# Patient Record
Sex: Male | Born: 1945 | Race: Black or African American | Hispanic: No | Marital: Married | Smoking: Former smoker
Health system: Southern US, Community
[De-identification: ages and names within clinical notes are randomized; demographics above are authoritative.]

## PROBLEM LIST (undated history)

## (undated) DIAGNOSIS — C61 Malignant neoplasm of prostate: Secondary | ICD-10-CM

## (undated) DIAGNOSIS — I214 Non-ST elevation (NSTEMI) myocardial infarction: Secondary | ICD-10-CM

## (undated) DIAGNOSIS — K5900 Constipation, unspecified: Secondary | ICD-10-CM

## (undated) DIAGNOSIS — I2510 Atherosclerotic heart disease of native coronary artery without angina pectoris: Secondary | ICD-10-CM

## (undated) DIAGNOSIS — E119 Type 2 diabetes mellitus without complications: Secondary | ICD-10-CM

## (undated) DIAGNOSIS — I639 Cerebral infarction, unspecified: Secondary | ICD-10-CM

## (undated) HISTORY — DX: Malignant neoplasm of prostate: C61

## (undated) HISTORY — DX: Atherosclerotic heart disease of native coronary artery without angina pectoris: I25.10

## (undated) HISTORY — PX: CARDIAC CATHETERIZATION: SHX172

## (undated) HISTORY — PX: CORONARY ARTERY BYPASS GRAFT: SHX141

## (undated) HISTORY — PX: CATARACT EXTRACTION: SUR2

---

## 2011-03-26 ENCOUNTER — Ambulatory Visit (INDEPENDENT_AMBULATORY_CARE_PROVIDER_SITE_OTHER): Payer: Medicare Other

## 2011-03-26 DIAGNOSIS — J111 Influenza due to unidentified influenza virus with other respiratory manifestations: Secondary | ICD-10-CM

## 2012-06-07 ENCOUNTER — Telehealth: Payer: Self-pay

## 2012-06-07 NOTE — Telephone Encounter (Signed)
We need to fax a referral for this patient to [facility] on [address] if possible he has app next Thursday.

## 2012-06-08 NOTE — Telephone Encounter (Signed)
Can we put in order for hearing eval? Pt here last on 03/2011 for dizziness/ chart in box PA desk

## 2012-06-08 NOTE — Telephone Encounter (Signed)
Why does pt need hearing eval?  We have not seen him in over a yr.

## 2012-06-12 ENCOUNTER — Telehealth: Payer: Self-pay

## 2012-06-12 NOTE — Telephone Encounter (Signed)
Pt would like referral to [facility] please call [phone] if questions

## 2012-06-12 NOTE — Telephone Encounter (Signed)
Spoke to [facility] pt needs appt

## 2012-06-12 NOTE — Telephone Encounter (Signed)
Called patient.  No answer.

## 2016-01-28 ENCOUNTER — Ambulatory Visit: Payer: Medicare Other

## 2016-02-04 ENCOUNTER — Ambulatory Visit: Payer: Medicare Other

## 2016-03-22 DIAGNOSIS — I2510 Atherosclerotic heart disease of native coronary artery without angina pectoris: Secondary | ICD-10-CM | POA: Insufficient documentation

## 2016-03-22 DIAGNOSIS — Z951 Presence of aortocoronary bypass graft: Secondary | ICD-10-CM | POA: Insufficient documentation

## 2016-03-31 ENCOUNTER — Ambulatory Visit (INDEPENDENT_AMBULATORY_CARE_PROVIDER_SITE_OTHER): Payer: Medicare Other | Admitting: Family Medicine

## 2016-03-31 ENCOUNTER — Telehealth: Payer: Self-pay

## 2016-03-31 VITALS — BP 106/68 | HR 100 | Temp 98.4°F | Resp 16 | Ht 68.0 in | Wt 160.0 lb

## 2016-03-31 DIAGNOSIS — I209 Angina pectoris, unspecified: Secondary | ICD-10-CM

## 2016-03-31 DIAGNOSIS — I25119 Atherosclerotic heart disease of native coronary artery with unspecified angina pectoris: Secondary | ICD-10-CM | POA: Diagnosis not present

## 2016-03-31 DIAGNOSIS — E118 Type 2 diabetes mellitus with unspecified complications: Secondary | ICD-10-CM

## 2016-03-31 MED ORDER — TRAMADOL HCL 50 MG PO TABS: 50.0000 mg | ORAL_TABLET | Freq: Three times a day (TID) | ORAL | 0 refills | Status: DC | PRN

## 2016-03-31 NOTE — Progress Notes (Signed)
Patient ID: [name] [name], male    DOB: 03-15-1946, 70 y.o.   MRN: [id number]  PCP: No primary care provider on file.  Chief Complaint  Patient presents with  . Referral    to cardiologist - states he had cardiology procedure on 11/4 in [place]  . lab work    Cholesterol/Thyroid    Subjective:  HPI 70 year old male presents for a follow-up post CABG in [facility]  On 11/4. Reports he was travelling and suffered an MI and had heart surgery with  3 stent placement. Upon discharge from [facility], he was referred to Dr. Clista Bernhardt, cardiologist. He reports following up Dr. Maudie Mercury although he and spouse do not understand, the reason patient hasn't been able to begin cardiac rehabilitation.  Patient presents today wanting to begin the process of being referred to a new cardiologist and receiving a new referral to cardiac rehabilitation. The visit at this point was delayed in order to contact Dr. Julianne Rice office to determine what orders have been initiated to expedite patient's cardiac rehabilitation.  -Called Dr. Julianne Rice office and spoke with her nurse. Patient being released to cardiac rehabilitation is pending sign off by the surgeon that performed CABG and patient obtaining a chest x-ray.  All other necessary orders have been performed and recommendation submitted for patient to start cardiac rehabilitation.  There may possibly be some minor delay in receiving the sign off from the cardiac surgeon due to the upcoming Christmas holiday.  -Resumed office visit to communicate information obtained from Dr. Julianne Rice office to patient and his wife. They verbalized understanding. Visit here at [facility] is not to establish care and obtain medication to treat medial sternum pain from surgical incision.  Post-CABG Mediastinal Surgical Incision  He has been taking tramadol for post surgical soreness. Denies any crushing or stabbing chest pain. No shortness of breath. Denies drainage of surgical wound. He  takes Plavix and aspirin daily for anticoagulation-denies bleeding gums or nose.  Diabetes, Controlled Taking Metformin every other day  Last glucose reading was in the in low 90's.    Chief Complaint  Patient presents with  . Referral    to cardiologist - states he had cardiology procedure on 11/4 in [place]  . lab work    Cholesterol/Thyroid     Social History   Social History  . Marital status: Married    Spouse name: N/A  . Number of children: N/A  . Years of education: N/A   Occupational History  . Not on file.   Social History Main Topics  . Smoking status: Never Smoker  . Smokeless tobacco: Never Used  . Alcohol use No  . Drug use: No  . Sexual activity: Not on file   Other Topics Concern  . Not on file   Social History Narrative  . No narrative on file    No family history on file.   Review of Systems See HPI   No Known Allergies  Prior to Admission medications   Medication Sig Start Date End Date Taking? Authorizing Provider  clopidogrel (PLAVIX) 75 MG tablet  02/19/16  Yes Historical Provider, MD  traMADol Veatrice Bourbon) 50 MG tablet  02/19/16  Yes Historical Provider, MD    Past Medical, Surgical Family and Social History reviewed and updated.    Objective:   Today's Vitals   03/31/16 1133  BP: 106/68  Pulse: 100  Resp: 16  Temp: 98.4 F (36.9 C)  TempSrc: Oral  SpO2: 98%  Weight: 160 lb (72.6 kg)  Height: 5\' 8"  (1.727 m)    Wt Readings from Last 3 Encounters:  03/31/16 160 lb (72.6 kg)    Physical Exam  Constitutional: He is oriented to person, place, and time. He appears well-developed.  HENT:  Head: Normocephalic and atraumatic.  Right Ear: External ear normal.  Left Ear: External ear normal.  Nose: Nose normal.  Mouth/Throat: Oropharynx is clear and moist.  Neck: Normal range of motion. Neck supple.  Cardiovascular: Regular rhythm, normal heart sounds and intact distal pulses.   No murmur heard. Pulmonary/Chest: Effort  normal and breath sounds normal. He exhibits no tenderness.  Mediastinal incision intact negative of drainage   Musculoskeletal: Normal range of motion.  Lymphadenopathy:    He has no cervical adenopathy.  Neurological: He is alert and oriented to person, place, and time.  Skin: Skin is warm and dry.  Psychiatric: He has a normal mood and affect. His behavior is normal. Judgment and thought content normal.       Assessment & Plan:  1. Coronary artery disease involving native coronary artery of native heart with angina pectoris ([place]) 2. Type 2 diabetes mellitus with complication, without long-term current use of insulin ([place]), stable   -Patient and spouse verbalize understanding regarding the referral process to cardiac rehabilitation.  -Tramadol 50 mg every 8 hours as needed for mediastinal pain.  -Return for follow-up as needed.  Carroll Sage. Kenton Kingfisher, MSN, FNP-C [facility]  Total of  1 hour spent with pt., greater than 75% which was spent in discussion of tx options, consulting with cardiologist, managing acute pain, and ensuring stability of chronic CAD condition.

## 2016-03-31 NOTE — Patient Instructions (Addendum)
Spoke with [facility] and Dr. Julianne Rice office and your chest x-ray is pending. Cardiac rehabilitation is pending sign off from your surgeon that performed surgery in [place].  You have a follow-up schedule with Dr. Maudie Mercury on 04/26/2016  Return for follow-up with me in 3 months.  I have refilled your Tramadol for pain.    IF you received an x-ray today, you will receive an invoice from [facility]. Please contact [facility] at [phone] with questions or concerns regarding your invoice.   IF you received labwork today, you will receive an invoice from [place]. Please contact [facility] at [phone] with questions or concerns regarding your invoice.   Our billing staff will not be able to assist you with questions regarding bills from these companies.  You will be contacted with the lab results as soon as they are available. The fastest way to get your results is to activate your My Chart account. Instructions are located on the last page of this paperwork. If you have not heard from [place] regarding the results in 2 weeks, please contact this office.      Cardiac Rehabilitation WHAT IS CARDIAC REHABILITATION? Cardiac rehabilitation is a treatment program that helps improve the health and well-being of people who have heart problems. Cardiac rehabilitation includes exercise training, education, and counseling to help you get stronger and return to an active lifestyle. This program can help you get better faster and reduce any future hospital stays. WHY MIGHT I NEED CARDIAC REHABILITATION? Cardiac rehabilitation programs can help when you have or have had:  A heart attack.  Heart failure.  Peripheral artery disease.  Coronary artery disease.  Angina.  Lung or breathing problems. Cardiac rehabilitation programs are also used when you have had:  Coronary artery bypass graft surgery.  Heart valve replacement.  Heart stent placement.  Heart  transplant.  Aneurysm repair. WHAT ARE THE BENEFITS OF CARDIAC REHABILITATION? Cardiac rehabilitation can help:  Reduce problems like chest pain and trouble breathing.  Change risk factors that contribute to heart disease, such as:  Smoking.  High blood pressure.  High cholesterol.  Diabetes.  Being out of shape or not active.  Weighing more than 30% higher than your ideal weight.  Diet.  Improve your mental outlook so you feel:  More hopeful.  Better about yourself.  More confident about taking care of yourself.  Get support from health experts as well as other people with similar problems.  Learn how to manage and understand your medicines.  Teach your family about your condition and how to participate in your recovery. WHAT HAPPENS IN CARDIAC REHABILITATION? You will be assessed by a cardiac rehabilitation team. They will check your health history and do a physical exam. You may need blood tests, stress tests, and other evaluations to make sure that you are ready to start cardiac rehabilitation. The cardiac rehabilitation team works with you to make a plan based on your health and goals. Your program will be tailored to fit you and your needs and may change as you progress. You may work with a health care team that includes:  Doctors.  Nurses.  Dietitians.  Psychologists.  Exercise specialists.  Physical and occupational therapists. WHAT ARE THE PHASES OF CARDIAC REHABILITATION? A cardiac rehabilitation program is often divided into phases. You advance from one phase to the next. Phase One This phase starts while you are still in the hospital. You may start by walking in your room and then in the hall. You may start  some simple exercises with a therapist. Phase Two This phase begins when you go home or to another facility. This phase may last 8-12 weeks. You will travel to a cardiac rehabilitation center or another place where rehabilitation is offered. You  will slowly increase your activity level while being closely watched by a nurse or therapist. Exercises may include a combination of strength or resistance training and "cardio" or aerobic movement on a treadmill or other machines. Your condition will determine how often and how long these sessions last. In phase two, you may learn how to cook healthy meals, control your blood sugar, and manage your medicines. You may need help with scheduling or planning how and when to take your medicines. If you have questions about your medicines, it is very important that you talk to your health care provider. Phase Three This phase continues for the rest of your life. There will be less supervision. You may still participate in cardiac rehabilitation activities or become part of a group in your community. You may benefit from talking about your experience with other people who are facing similar challenges. WHEN SHOULD I SEEK IMMEDIATE MEDICAL CARE? Seek immediate medical care if:  You have severe chest discomfort, especially if the pain is crushing or pressure-like and spreads to your arms, back, neck, or jaw. Do not wait to see if the pain will go away.  You have weakness or numbness in your face, arms, or legs, especially on one side of the body.  Your speech is slurred.  You are confused.  You have a sudden severe headache or loss of vision.  You have shortness of breath.  You are sweating and have nausea.  You feel dizzy or faint.  You are fatigued. These symptoms may represent a serious problem that is an emergency. Do not wait to see if the symptoms will go away. Get medical help right away. Call your local emergency services (911 in the [place] Do not drive yourself to the hospital. This information is not intended to replace advice given to you by your health care provider. Make sure you discuss any questions you have with your health care provider. Document Released: 01/05/2008 Document  Revised: 08/06/2015 Document Reviewed: 02/09/2015 Elsevier Interactive Patient Education  2017 [employer].

## 2016-03-31 NOTE — Telephone Encounter (Signed)
Pt wife is needing to talk with Kenton Kingfisher regarding a form that they talked about when patient was seen today   Best number [phone]

## 2016-04-05 ENCOUNTER — Telehealth: Payer: Self-pay

## 2016-04-05 NOTE — Telephone Encounter (Signed)
Spoke to pt and then wife about what was needed regarding form. They reported that they have gotten it all straightened out with the cardiologist.

## 2016-04-05 NOTE — Telephone Encounter (Signed)
Pt had heart surgery in November and is having some chest congestion and would like to talk with Molli Barrows as to what can be taken along with all his other meds   Best number [phone]

## 2016-04-06 ENCOUNTER — Ambulatory Visit: Payer: Medicare Other | Admitting: Family Medicine

## 2016-04-06 NOTE — Telephone Encounter (Signed)
Called and advised wife to bring pt in to be evaluated if pt's mild Sxs worsen or if he gets a fever, SOB, increased fatigue. Wife agreed. Suggested that she ask their pharm about OTC meds that might interfere with Rxs pt is taking. She stated she did last night and pharm suggested Mucinex. I advised we most often recommend this also w/drinking plenty of water. They have made a tentative appt this afternoon in case pt doesn't improve or worsens.

## 2016-04-07 ENCOUNTER — Encounter (HOSPITAL_COMMUNITY)
Admission: RE | Admit: 2016-04-07 | Discharge: 2016-04-07 | Disposition: A | Discharge status: Home / Self Care | Payer: Medicare Other | Source: Ambulatory Visit | Attending: Cardiology | Admitting: Cardiology

## 2016-04-07 ENCOUNTER — Encounter (HOSPITAL_COMMUNITY): Payer: Self-pay

## 2016-04-07 VITALS — BP 100/80 | HR 97 | Ht 65.25 in | Wt 161.4 lb

## 2016-04-07 DIAGNOSIS — Z951 Presence of aortocoronary bypass graft: Secondary | ICD-10-CM | POA: Insufficient documentation

## 2016-04-07 DIAGNOSIS — Z87891 Personal history of nicotine dependence: Secondary | ICD-10-CM | POA: Diagnosis not present

## 2016-04-07 DIAGNOSIS — Z9889 Other specified postprocedural states: Secondary | ICD-10-CM | POA: Insufficient documentation

## 2016-04-07 DIAGNOSIS — E119 Type 2 diabetes mellitus without complications: Secondary | ICD-10-CM | POA: Diagnosis not present

## 2016-04-07 DIAGNOSIS — Z48812 Encounter for surgical aftercare following surgery on the circulatory system: Secondary | ICD-10-CM | POA: Insufficient documentation

## 2016-04-07 HISTORY — DX: Type 2 diabetes mellitus without complications: E11.9

## 2016-04-07 HISTORY — DX: Cerebral infarction, unspecified: I63.9

## 2016-04-07 HISTORY — DX: Constipation, unspecified: K59.00

## 2016-04-07 HISTORY — DX: Non-ST elevation (NSTEMI) myocardial infarction: I21.4

## 2016-04-07 NOTE — Progress Notes (Signed)
Cardiac Rehab Medication Review by a Pharmacist  Does the patient  feel that his/her medications are working for him/her?  yes  Has the patient been experiencing any side effects to the medications prescribed?  No longer. Patient was experiencing fatigue and dizziness, therefore his prescriber discontinued metoprolol and atorvastatin.   Does the patient measure his/her own blood pressure or blood glucose at home?  no   Does the patient have any problems obtaining medications due to transportation or finances?   no  Understanding of regimen: fair Understanding of indications: fair Potential of compliance: good   Pharmacist comments: TE is a 49 yoM who presents to cardiac rehab s/p CABG in good spirits without assistance. He is accompanied by his wife. Today, we addressed his concern of being "back to normal" to which I explained that cardiac rehab will help strengthen his heart and helps him improve his tolerance. Overall, he understands and agrees with his medication regimen.    [name] [name], PharmD PGY1 Pharmacy Resident [phone] (Pager) 04/07/2016 8:00 AM

## 2016-04-07 NOTE — Progress Notes (Signed)
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  clopidogrel (PLAVIX) 75 MG tablet, Take 75 mg by mouth once. , Disp: , Rfl:  .  senna (SENOKOT) 8.6 MG tablet, Take 1 tablet by mouth 2 (two) times daily as needed for constipation., Disp: , Rfl:  .  traMADol (ULTRAM) 50 MG tablet, Take 1 tablet (50 mg total) by mouth every 8 (eight) hours as needed for moderate pain. (Patient taking differently: Take 25 mg by mouth every 4 (four) hours as needed for moderate pain. ), Disp: 30 tablet, Rfl: 0 .  acetaminophen (TYLENOL) 650 MG CR tablet, Take 650 mg by mouth QID., Disp: , Rfl:  .  aspirin EC 81 MG tablet, Take 81 mg by mouth daily., Disp: , Rfl:   Past Medical History: Past Medical History:  Diagnosis Date  . Constipation   . Diabetes mellitus without complication ([place])    borderline/ pre diabetic  . Stroke ([employer])    L side - 40 years ago    Tobacco Use: History  Smoking Status  . Former Smoker  . Quit date: 1970  Smokeless Tobacco  . Never Used    Labs: Recent Review Flowsheet Data    There is no flowsheet data to display.      Capillary Blood Glucose: No results found for: GLUCAP   Exercise Target Goals: Date: 04/07/16  Exercise Program Goal: Individual exercise prescription set with THRR, safety & activity barriers. Participant demonstrates ability to understand and report RPE using BORG  scale, to self-measure pulse accurately, and to acknowledge the importance of the exercise prescription.  Exercise Prescription Goal: Starting with aerobic activity 30 plus minutes a day, 3 days per week for initial exercise prescription. Provide home exercise prescription and guidelines that participant acknowledges understanding prior to discharge.  Activity Barriers & Risk Stratification:     Activity Barriers & Cardiac Risk Stratification - 04/07/16 0918      Activity Barriers & Cardiac Risk Stratification   Activity Barriers Deconditioning;Muscular Weakness;Joint Problems;Other (comment)   Comments limited shoulder mobility (B), L sided stroke 40 years ago and L knee/leg discomfort   Cardiac Risk Stratification High      6 Minute Walk:     6 Minute Walk    Row Name 04/07/16 1125 04/07/16 1126 04/07/16 1248     6 Minute Walk   Phase Initial (P)  Initial Initial   Distance  - (P)  878 feet 878 feet   Walk Time  - (P)  6 minutes 6 minutes   # of Rest Breaks  - (P)  0 0   RPE  -  - 11   Symptoms  -  - No   Resting HR  -  - 97 bpm   Resting BP  -  - 100/80   Max Ex. HR  -  - 110 bpm   Max Ex. BP  -  -  114/86   2 Minute Post BP  -  - 108/76   Row Name 04/07/16 1251         6 Minute Walk   MPH 1.68     METS 2.1     VO2 Peak 7.48        Initial Exercise Prescription:     Initial Exercise Prescription - 04/07/16 1200      Date of Initial Exercise RX and Referring Provider   Date 04/07/16   Referring Provider Clista Bernhardt MD, Loreta Ave MD     Recumbant Bike   Level 1   Minutes 10   METs 1     NuStep   Level 1   Minutes 10   METs 1     Track   Laps 6   Minutes 10   METs 2.03     Prescription Details   Frequency (times per week) 3   Duration Progress to 30 minutes of continuous aerobic without signs/symptoms of physical distress     Intensity   THRR 40-80% of Max Heartrate 60-120   Ratings of Perceived Exertion 11-13   Perceived Dyspnea 0-4      Progression   Progression Continue progressive overload as per policy without signs/symptoms or physical distress.     Resistance Training   Training Prescription Yes   Weight 1lb   Reps 10-12      Perform Capillary Blood Glucose checks as needed.  Exercise Prescription Changes:   Exercise Comments:   Discharge Exercise Prescription (Final Exercise Prescription Changes):   Nutrition:  Target Goals: Understanding of nutrition guidelines, daily intake of sodium 1500mg , cholesterol 200mg , calories 30% from fat and 7% or less from saturated fats, daily to have 5 or more servings of fruits and vegetables.  Biometrics:     Pre Biometrics - 04/07/16 1249      Pre Biometrics   Height 5' 5.25" (1.657 m)   Weight 161 lb 6 oz (73.2 kg)   Waist Circumference 37 inches   Hip Circumference 35.5 inches   Waist to Hip Ratio 1.04 %   BMI (Calculated) 26.7   Triceps Skinfold 12 mm   % Body Fat 25.2 %   Grip Strength 34 kg   Flexibility 7.5 in   Single Leg Stand 0 seconds       Nutrition Therapy Plan and Nutrition Goals:   Nutrition Discharge: Nutrition Scores:   Nutrition Goals Re-Evaluation:   Psychosocial: Target Goals: Acknowledge presence or absence of depression, maximize coping skills, provide positive support system. Participant is able to verbalize types and ability to use techniques and skills needed for reducing stress and depression.  Initial Review & Psychosocial Screening:     Initial Psych Review & Screening - 04/07/16 1558      Initial Review   Current issues with Current Stress Concerns   Source of Stress Concerns Unable to participate in former interests or hobbies;Chronic Illness   Comments pt rates his low level of stress related to work and health     [place]? Yes   Comments Pt wife accompanied him for his orientation appt.     Barriers   Psychosocial barriers to participate in program The patient should benefit  from training in stress management and relaxation.      Quality of Life Scores:     Quality of Life - 04/07/16 1346      Quality of Life Scores   Health/Function Pre 23.8 %  Socioeconomic Pre 25.33 %   Psych/Spiritual Pre 25.71 %   Family Pre 25.2 %   GLOBAL Pre 24.7 %      PHQ-9: Recent Review Flowsheet Data    There is no flowsheet data to display.      Psychosocial Evaluation and Intervention:     Psychosocial Evaluation - 04/07/16 1601      Psychosocial Evaluation & Interventions   Interventions Stress management education;Relaxation education      Psychosocial Re-Evaluation:   Vocational Rehabilitation: Provide vocational rehab assistance to qualifying candidates.   Vocational Rehab Evaluation & Intervention:     Vocational Rehab - 04/07/16 1603      Initial Vocational Rehab Evaluation & Intervention   Assessment shows need for Vocational Rehabilitation Yes  Pt indicated he would like to meet with the vocational rehab  counselor.  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      Education: Education Goals: Education classes will be provided on a weekly basis, covering required topics. Participant will state understanding/return demonstration of topics presented.  Learning Barriers/Preferences:     Learning Barriers/Preferences - 04/07/16 [id number]      Learning Barriers/Preferences   Learning Barriers Sight   Learning Preferences Written Material;Pictoral;Individual Instruction      Education Topics: Count Your Pulse:  -Group instruction provided by verbal instruction, demonstration, patient participation and written materials to support subject.  Instructors address importance of being able to find your pulse and how to count your pulse when at home without a heart monitor.  Patients get hands on experience counting their pulse with staff help and individually.   Heart Attack, Angina, and Risk Factor Modification:  -Group instruction  provided by verbal instruction, video, and written materials to support subject.  Instructors address signs and symptoms of angina and heart attacks.    Also discuss risk factors for heart disease and how to make changes to improve heart health risk factors.   Functional Fitness:  -Group instruction provided by verbal instruction, demonstration, patient participation, and written materials to support subject.  Instructors address safety measures for doing things around the house.  Discuss how to get up and down off the floor, how to pick things up properly, how to safely get out of a chair without assistance, and balance training.   Meditation and Mindfulness:  -Group instruction provided by verbal instruction, patient participation, and written materials to support subject.  Instructor addresses importance of mindfulness and meditation practice to help reduce stress and improve awareness.  Instructor also leads participants through a meditation exercise.    Stretching for Flexibility and Mobility:  -Group instruction provided by verbal instruction, patient participation, and written materials to support subject.  Instructors lead participants through series of stretches that are designed to increase flexibility thus improving mobility.  These stretches are additional exercise for major muscle groups that are typically performed during regular warm up and cool down.   Hands Only CPR Anytime:  -Group instruction provided by verbal instruction, video, patient participation and written materials to support subject.  Instructors co-teach with [facility]HA video for hands only CPR.  Participants get hands on experience with mannequins.   Nutrition I class: Heart Healthy Eating:  -Group instruction provided by PowerPoint slides, verbal discussion, and written materials to support subject matter. The instructor gives an explanation and review of the Therapeutic Lifestyle Changes diet recommendations, which  includes a discussion on lipid goals, dietary fat, sodium, fiber, plant stanol/sterol esters, sugar, and the components of a  well-balanced, healthy diet.   Nutrition II class: Lifestyle Skills:  -Group instruction provided by PowerPoint slides, verbal discussion, and written materials to support subject matter. The instructor gives an explanation and review of label reading, grocery shopping for heart health, heart healthy recipe modifications, and ways to make healthier choices when eating out.   Diabetes Question & Answer:  -Group instruction provided by PowerPoint slides, verbal discussion, and written materials to support subject matter. The instructor gives an explanation and review of diabetes co-morbidities, pre- and post-prandial blood glucose goals, pre-exercise blood glucose goals, signs, symptoms, and treatment of hypoglycemia and hyperglycemia, and foot care basics.   Diabetes Blitz:  -Group instruction provided by PowerPoint slides, verbal discussion, and written materials to support subject matter. The instructor gives an explanation and review of the physiology behind type 1 and type 2 diabetes, diabetes medications and rational behind using different medications, pre- and post-prandial blood glucose recommendations and Hemoglobin A1c goals, diabetes diet, and exercise including blood glucose guidelines for exercising safely.    Portion Distortion:  -Group instruction provided by PowerPoint slides, verbal discussion, written materials, and food models to support subject matter. The instructor gives an explanation of serving size versus portion size, changes in portions sizes over the last 20 years, and what consists of a serving from each food group.   Stress Management:  -Group instruction provided by verbal instruction, video, and written materials to support subject matter.  Instructors review role of stress in heart disease and how to cope with stress positively.      Exercising on Your Own:  -Group instruction provided by verbal instruction, power point, and written materials to support subject.  Instructors discuss benefits of exercise, components of exercise, frequency and intensity of exercise, and end points for exercise.  Also discuss use of nitroglycerin and activating EMS.  Review options of places to exercise outside of rehab.  Review guidelines for sex with heart disease.   Cardiac Drugs I:  -Group instruction provided by verbal instruction and written materials to support subject.  Instructor reviews cardiac drug classes: antiplatelets, anticoagulants, beta blockers, and statins.  Instructor discusses reasons, side effects, and lifestyle considerations for each drug class.   Cardiac Drugs II:  -Group instruction provided by verbal instruction and written materials to support subject.  Instructor reviews cardiac drug classes: angiotensin converting enzyme inhibitors (ACE-I), angiotensin II receptor blockers (ARBs), nitrates, and calcium channel blockers.  Instructor discusses reasons, side effects, and lifestyle considerations for each drug class.   Anatomy and Physiology of the Circulatory System:  -Group instruction provided by verbal instruction, video, and written materials to support subject.  Reviews functional anatomy of heart, how it relates to various diagnoses, and what role the heart plays in the overall system.   Knowledge Questionnaire Score:     Knowledge Questionnaire Score - 04/07/16 1125      Knowledge Questionnaire Score   Pre Score 23/24      Core Components/Risk Factors/Patient Goals at Admission:     Personal Goals and Risk Factors at Admission - 04/07/16 1606      Core Components/Risk Factors/Patient Goals on Admission   Diabetes --  pre diabetic      Core Components/Risk Factors/Patient Goals Review:    Core Components/Risk Factors/Patient Goals at Discharge (Final Review):    ITP Comments:      ITP Comments    Row Name 04/07/16 0909           ITP Comments Dr. Loreta Ave, Medical Director  Comments:  Pt is s/p NSTEMI and Cabg x 3 in [place] pt of Dr. Maudie Mercury and Dr. Myles Gip at [facility] is  in today for cardiac rehab arrived by wheel chair to orientation today from 0800 to 1015. As a part of the orientation appointment pt completed 5 minutes of warm up stretches and 6 minute walk test.  Pt tolerated walk test well and used rolling wheelchair cart for support.  Monitor showed SR with sinus arrhythmias noted. Pt is accompanied by his wife.  Pt indicated on his risk factor profile a low level of stress associated with stress and health.  Pt reports having good health leading up to his cardiac event that occurred while he was traveling out of town. Brief Psychosocial Assessment revealed concerns regarding stress.  Pt is excited to participate in cardiac rehab on next Friday. Cherre Huger, BSN

## 2016-04-13 ENCOUNTER — Encounter (HOSPITAL_COMMUNITY): Payer: Self-pay

## 2016-04-13 ENCOUNTER — Encounter (HOSPITAL_COMMUNITY): Admission: RE | Admit: 2016-04-13 | Payer: Medicare Other | Source: Ambulatory Visit

## 2016-04-13 NOTE — Progress Notes (Signed)
Cardiac Individual Treatment Plan  Patient Details  Name: [name] [name] MRN: [id number] Date of Birth: Mar 09, 1946 Referring Provider:   Flowsheet Row CARDIAC REHAB PHASE II ORIENTATION from 04/07/2016 in [place]  Referring Provider  Maudie Mercury, Andee Poles MD, Loreta Ave MD      Initial Encounter Date:  [place] PHASE II ORIENTATION from 04/07/2016 in [place]  Date  04/07/16  Referring Provider  Clista Bernhardt MD, Loreta Ave MD      Visit Diagnosis: No diagnosis found.  Patient's Home Medications on Admission:  Current Outpatient Prescriptions:  .  acetaminophen (TYLENOL) 650 MG CR tablet, Take 650 mg by mouth QID., Disp: , Rfl:  .  aspirin EC 81 MG tablet, Take 81 mg by mouth daily., Disp: , Rfl:  .  clopidogrel (PLAVIX) 75 MG tablet, Take 75 mg by mouth once. , Disp: , Rfl:  .  senna (SENOKOT) 8.6 MG tablet, Take 1 tablet by mouth 2 (two) times daily as needed for constipation., Disp: , Rfl:  .  traMADol (ULTRAM) 50 MG tablet, Take 1 tablet (50 mg total) by mouth every 8 (eight) hours as needed for moderate pain. (Patient taking differently: Take 25 mg by mouth every 4 (four) hours as needed for moderate pain. ), Disp: 30 tablet, Rfl: 0  Past Medical History: Past Medical History:  Diagnosis Date  . Constipation   . Diabetes mellitus without complication ([place])    borderline/ pre diabetic  . NSTEMI (non-ST elevated myocardial infarction) ([place])    at [place]  . Stroke ([place])    L side - 40 years ago    Tobacco Use: History  Smoking Status  . Former Smoker  . Quit date: 1970  Smokeless Tobacco  . Never Used    Labs: Recent Review Flowsheet Data    There is no flowsheet data to display.      Capillary Blood Glucose: No results found for: GLUCAP   Exercise Target Goals:    Exercise Program Goal: Individual exercise prescription set with THRR, safety & activity barriers. Participant  demonstrates ability to understand and report RPE using BORG scale, to self-measure pulse accurately, and to acknowledge the importance of the exercise prescription.  Exercise Prescription Goal: Starting with aerobic activity 30 plus minutes a day, 3 days per week for initial exercise prescription. Provide home exercise prescription and guidelines that participant acknowledges understanding prior to discharge.  Activity Barriers & Risk Stratification:     Activity Barriers & Cardiac Risk Stratification - 04/07/16 0918      Activity Barriers & Cardiac Risk Stratification   Activity Barriers Deconditioning;Muscular Weakness;Joint Problems;Other (comment)   Comments limited shoulder mobility (B), L sided stroke 40 years ago and L knee/leg discomfort   Cardiac Risk Stratification High      6 Minute Walk:     6 Minute Walk    Row Name 04/07/16 1125 04/07/16 1126 04/07/16 1248     6 Minute Walk   Phase Initial (P)  Initial Initial   Distance  - (P)  878 feet 878 feet   Walk Time  - (P)  6 minutes 6 minutes   # of Rest Breaks  - (P)  0 0   RPE  -  - 11   Symptoms  -  - No   Resting HR  -  - 97 bpm   Resting BP  -  - 100/80   Max Ex. HR  -  -  110 bpm   Max Ex. BP  -  - 114/86   2 Minute Post BP  -  - 108/76   Row Name 04/07/16 1251         6 Minute Walk   MPH 1.68     METS 2.1     VO2 Peak 7.48        Initial Exercise Prescription:     Initial Exercise Prescription - 04/07/16 1200      Date of Initial Exercise RX and Referring Provider   Date 04/07/16   Referring Provider Clista Bernhardt MD, Loreta Ave MD     Recumbant Bike   Level 1   Minutes 10   METs 1     NuStep   Level 1   Minutes 10   METs 1     Track   Laps 6   Minutes 10   METs 2.03     Prescription Details   Frequency (times per week) 3   Duration Progress to 30 minutes of continuous aerobic without signs/symptoms of physical distress     Intensity   THRR 40-80% of Max Heartrate 60-120    Ratings of Perceived Exertion 11-13   Perceived Dyspnea 0-4     Progression   Progression Continue progressive overload as per policy without signs/symptoms or physical distress.     Resistance Training   Training Prescription Yes   Weight 1lb   Reps 10-12      Perform Capillary Blood Glucose checks as needed.  Exercise Prescription Changes:   Exercise Comments:   Discharge Exercise Prescription (Final Exercise Prescription Changes):   Nutrition:  Target Goals: Understanding of nutrition guidelines, daily intake of sodium 1500mg , cholesterol 200mg , calories 30% from fat and 7% or less from saturated fats, daily to have 5 or more servings of fruits and vegetables.  Biometrics:     Pre Biometrics - 04/07/16 1249      Pre Biometrics   Height 5' 5.25" (1.657 m)   Weight 161 lb 6 oz (73.2 kg)   Waist Circumference 37 inches   Hip Circumference 35.5 inches   Waist to Hip Ratio 1.04 %   BMI (Calculated) 26.7   Triceps Skinfold 12 mm   % Body Fat 25.2 %   Grip Strength 34 kg   Flexibility 7.5 in   Single Leg Stand 0 seconds       Nutrition Therapy Plan and Nutrition Goals:   Nutrition Discharge: Nutrition Scores:   Nutrition Goals Re-Evaluation:   Psychosocial: Target Goals: Acknowledge presence or absence of depression, maximize coping skills, provide positive support system. Participant is able to verbalize types and ability to use techniques and skills needed for reducing stress and depression.  Initial Review & Psychosocial Screening:     Initial Psych Review & Screening - 04/07/16 1558      Initial Review   Current issues with Current Stress Concerns   Source of Stress Concerns Unable to participate in former interests or hobbies;Chronic Illness   Comments pt rates his low level of stress related to work and health     [facility]? Yes   Comments Pt wife accompanied him for his orientation appt.     Barriers    Psychosocial barriers to participate in program The patient should benefit from training in stress management and relaxation.      Quality of Life Scores:     Quality of Life - 04/07/16 1346  Quality of Life Scores   Health/Function Pre 23.8 %   Socioeconomic Pre 25.33 %   Psych/Spiritual Pre 25.71 %   Family Pre 25.2 %   GLOBAL Pre 24.7 %      PHQ-9: Recent Review Flowsheet Data    There is no flowsheet data to display.      Psychosocial Evaluation and Intervention:     Psychosocial Evaluation - 04/07/16 1601      Psychosocial Evaluation & Interventions   Interventions Stress management education;Relaxation education      Psychosocial Re-Evaluation:   Vocational Rehabilitation: Provide vocational rehab assistance to qualifying candidates.   Vocational Rehab Evaluation & Intervention:     Vocational Rehab - 04/07/16 1603      Initial Vocational Rehab Evaluation & Intervention   Assessment shows need for Vocational Rehabilitation Yes  Pt indicated he would like to meet with the vocational rehab  counselor.  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      Education: Education Goals: Education classes will be provided on a weekly basis, covering required topics. Participant will state understanding/return demonstration of topics presented.  Learning Barriers/Preferences:     Learning Barriers/Preferences - 04/07/16 [id number]      Learning Barriers/Preferences   Learning Barriers Sight   Learning Preferences Written Material;Pictoral;Individual Instruction      Education Topics: Count Your Pulse:  -Group instruction provided by verbal instruction, demonstration, patient participation and written materials to support subject.  Instructors address importance of being able to find your pulse and how to count your pulse when at home without a heart monitor.  Patients get hands on experience counting their pulse with staff help and  individually.   Heart Attack, Angina, and Risk Factor Modification:  -Group instruction provided by verbal instruction, video, and written materials to support subject.  Instructors address signs and symptoms of angina and heart attacks.    Also discuss risk factors for heart disease and how to make changes to improve heart health risk factors.   Functional Fitness:  -Group instruction provided by verbal instruction, demonstration, patient participation, and written materials to support subject.  Instructors address safety measures for doing things around the house.  Discuss how to get up and down off the floor, how to pick things up properly, how to safely get out of a chair without assistance, and balance training.   Meditation and Mindfulness:  -Group instruction provided by verbal instruction, patient participation, and written materials to support subject.  Instructor addresses importance of mindfulness and meditation practice to help reduce stress and improve awareness.  Instructor also leads participants through a meditation exercise.    Stretching for Flexibility and Mobility:  -Group instruction provided by verbal instruction, patient participation, and written materials to support subject.  Instructors lead participants through series of stretches that are designed to increase flexibility thus improving mobility.  These stretches are additional exercise for major muscle groups that are typically performed during regular warm up and cool down.   Hands Only CPR Anytime:  -Group instruction provided by verbal instruction, video, patient participation and written materials to support subject.  Instructors co-teach with AHA video for hands only CPR.  Participants get hands on experience with mannequins.   Nutrition I class: Heart Healthy Eating:  -Group instruction provided by PowerPoint slides, verbal discussion, and written materials to support subject matter. The instructor gives an  explanation and review of the Therapeutic Lifestyle Changes diet recommendations, which includes a discussion on lipid goals, dietary  fat, sodium, fiber, plant stanol/sterol esters, sugar, and the components of a well-balanced, healthy diet.   Nutrition II class: Lifestyle Skills:  -Group instruction provided by PowerPoint slides, verbal discussion, and written materials to support subject matter. The instructor gives an explanation and review of label reading, grocery shopping for heart health, heart healthy recipe modifications, and ways to make healthier choices when eating out.   Diabetes Question & Answer:  -Group instruction provided by PowerPoint slides, verbal discussion, and written materials to support subject matter. The instructor gives an explanation and review of diabetes co-morbidities, pre- and post-prandial blood glucose goals, pre-exercise blood glucose goals, signs, symptoms, and treatment of hypoglycemia and hyperglycemia, and foot care basics.   Diabetes Blitz:  -Group instruction provided by PowerPoint slides, verbal discussion, and written materials to support subject matter. The instructor gives an explanation and review of the physiology behind type 1 and type 2 diabetes, diabetes medications and rational behind using different medications, pre- and post-prandial blood glucose recommendations and Hemoglobin A1c goals, diabetes diet, and exercise including blood glucose guidelines for exercising safely.    Portion Distortion:  -Group instruction provided by PowerPoint slides, verbal discussion, written materials, and food models to support subject matter. The instructor gives an explanation of serving size versus portion size, changes in portions sizes over the last 20 years, and what consists of a serving from each food group.   Stress Management:  -Group instruction provided by verbal instruction, video, and written materials to support subject matter.  Instructors  review role of stress in heart disease and how to cope with stress positively.     Exercising on Your Own:  -Group instruction provided by verbal instruction, power point, and written materials to support subject.  Instructors discuss benefits of exercise, components of exercise, frequency and intensity of exercise, and end points for exercise.  Also discuss use of nitroglycerin and activating EMS.  Review options of places to exercise outside of rehab.  Review guidelines for sex with heart disease.   Cardiac Drugs I:  -Group instruction provided by verbal instruction and written materials to support subject.  Instructor reviews cardiac drug classes: antiplatelets, anticoagulants, beta blockers, and statins.  Instructor discusses reasons, side effects, and lifestyle considerations for each drug class.   Cardiac Drugs II:  -Group instruction provided by verbal instruction and written materials to support subject.  Instructor reviews cardiac drug classes: angiotensin converting enzyme inhibitors (ACE-I), angiotensin II receptor blockers (ARBs), nitrates, and calcium channel blockers.  Instructor discusses reasons, side effects, and lifestyle considerations for each drug class.   Anatomy and Physiology of the Circulatory System:  -Group instruction provided by verbal instruction, video, and written materials to support subject.  Reviews functional anatomy of heart, how it relates to various diagnoses, and what role the heart plays in the overall system.   Knowledge Questionnaire Score:     Knowledge Questionnaire Score - 04/07/16 1125      Knowledge Questionnaire Score   Pre Score 23/24      Core Components/Risk Factors/Patient Goals at Admission:     Personal Goals and Risk Factors at Admission - 04/07/16 1606      Core Components/Risk Factors/Patient Goals on Admission   Diabetes --  pre diabetic      Core Components/Risk Factors/Patient Goals Review:    Core Components/Risk  Factors/Patient Goals at Discharge (Final Review):    ITP Comments:     ITP Comments    Row Name 04/07/16 [phone]  ITP Comments Dr. Loreta Ave, Medical Director           Comments: Pt is scheduled to begin group exercise class on 04/15/2016.   Recommend continued exercise and life style modification education including  stress management and relaxation techniques to decrease cardiac risk profile.

## 2016-04-15 ENCOUNTER — Encounter (HOSPITAL_COMMUNITY): Payer: Self-pay

## 2016-04-15 ENCOUNTER — Encounter (HOSPITAL_COMMUNITY)
Admission: RE | Admit: 2016-04-15 | Discharge: 2016-04-15 | Disposition: A | Discharge status: Home / Self Care | Payer: Medicare Other | Source: Ambulatory Visit | Attending: Cardiology | Admitting: Cardiology

## 2016-04-15 DIAGNOSIS — E119 Type 2 diabetes mellitus without complications: Secondary | ICD-10-CM | POA: Diagnosis not present

## 2016-04-15 DIAGNOSIS — Z9889 Other specified postprocedural states: Secondary | ICD-10-CM | POA: Insufficient documentation

## 2016-04-15 DIAGNOSIS — Z951 Presence of aortocoronary bypass graft: Secondary | ICD-10-CM | POA: Insufficient documentation

## 2016-04-15 DIAGNOSIS — Z48812 Encounter for surgical aftercare following surgery on the circulatory system: Secondary | ICD-10-CM | POA: Insufficient documentation

## 2016-04-15 DIAGNOSIS — Z87891 Personal history of nicotine dependence: Secondary | ICD-10-CM | POA: Diagnosis not present

## 2016-04-15 LAB — GLUCOSE, CAPILLARY: GLUCOSE-CAPILLARY: 138 mg/dL — AB (ref 65–99)

## 2016-04-15 NOTE — Progress Notes (Signed)
Daily Session Note  Patient Details  Name: [name] [name] MRN: [id number] Date of Birth: [birth year] Referring Provider:   Flowsheet Row CARDIAC REHAB PHASE II ORIENTATION from 04/07/2016 in [place]  Referring Provider  Clista Bernhardt MD, Loreta Ave MD      Encounter Date: 04/15/2016  Check In:     Session Check In - 04/15/16 0815      Check-In   Location [facility]   Staff Present Dorna Bloom, MS, ACSM RCEP, Exercise Physiologist;Portia Rollene Rotunda, RN, Deland Pretty, MS, ACSM CEP, Exercise Physiologist;Joann Rion, RN, BSN   Supervising physician immediately available to respond to emergencies [facility] immediately available   Physician(s) Dr. Tana Coast   Medication changes reported     No   Fall or balance concerns reported    No   Warm-up and Cool-down Performed as group-led instruction   Resistance Training Performed Yes   VAD Patient? No     Pain Assessment   Currently in Pain? No/denies   Multiple Pain Sites No      Capillary Blood Glucose: Results for orders placed or performed during the hospital encounter of 04/15/16 (from the past 24 hour(s))  Glucose, capillary     Status: Abnormal   Collection Time: 04/15/16  8:29 AM  Result Value Ref Range   Glucose-Capillary 138 (H) 65 - 99 mg/dL     Goals Met:  Exercise tolerated well  Goals Unmet:  Not Applicable  Comments: Pt started cardiac rehab today.  Pt tolerated light exercise without difficulty. VSS, telemetry-sinus rhythm,  asymptomatic.  Medication list reconciled. Pt denies barriers to medicaiton compliance.  PSYCHOSOCIAL ASSESSMENT:  PHQ-0. Pt exhibits positive coping skills, hopeful outlook with supportive family. No psychosocial needs identified at this time, no psychosocial interventions necessary.    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  Pt goals for cardiac rehab are to be physically stable and increase his muscle strength.   Pt  oriented to exercise equipment and routine.    Understanding verbalized.   Dr. Fransico Him is Medical Director for Cardiac Rehab at [facility].

## 2016-04-18 ENCOUNTER — Encounter (HOSPITAL_COMMUNITY)
Admission: RE | Admit: 2016-04-18 | Discharge: 2016-04-18 | Disposition: A | Discharge status: Home / Self Care | Payer: Medicare Other | Source: Ambulatory Visit | Attending: Cardiology | Admitting: Cardiology

## 2016-04-18 DIAGNOSIS — Z951 Presence of aortocoronary bypass graft: Secondary | ICD-10-CM

## 2016-04-18 DIAGNOSIS — Z48812 Encounter for surgical aftercare following surgery on the circulatory system: Secondary | ICD-10-CM | POA: Diagnosis not present

## 2016-04-20 ENCOUNTER — Encounter (HOSPITAL_COMMUNITY)
Admission: RE | Admit: 2016-04-20 | Discharge: 2016-04-20 | Disposition: A | Discharge status: Home / Self Care | Payer: Medicare Other | Source: Ambulatory Visit | Attending: Cardiology | Admitting: Cardiology

## 2016-04-20 ENCOUNTER — Other Ambulatory Visit: Payer: Self-pay | Admitting: Family Medicine

## 2016-04-20 DIAGNOSIS — Z48812 Encounter for surgical aftercare following surgery on the circulatory system: Secondary | ICD-10-CM | POA: Diagnosis not present

## 2016-04-20 DIAGNOSIS — Z951 Presence of aortocoronary bypass graft: Secondary | ICD-10-CM

## 2016-04-20 MED ORDER — TRAMADOL HCL 50 MG PO TABS: 50.0000 mg | ORAL_TABLET | Freq: Three times a day (TID) | ORAL | 0 refills | Status: DC | PRN

## 2016-04-20 NOTE — Progress Notes (Signed)
Refilled tramadol for patient. He is in cardiac rehabilitation and is out of pain medications.  [name] [name]. Kenton Kingfisher, MSN, FNP-C [facility]

## 2016-04-20 NOTE — Telephone Encounter (Signed)
Pt checking on this request

## 2016-04-22 ENCOUNTER — Encounter (HOSPITAL_COMMUNITY)
Admission: RE | Admit: 2016-04-22 | Discharge: 2016-04-22 | Disposition: A | Discharge status: Home / Self Care | Payer: Medicare Other | Source: Ambulatory Visit | Attending: Cardiology | Admitting: Cardiology

## 2016-04-22 DIAGNOSIS — Z48812 Encounter for surgical aftercare following surgery on the circulatory system: Secondary | ICD-10-CM | POA: Diagnosis not present

## 2016-04-22 DIAGNOSIS — Z951 Presence of aortocoronary bypass graft: Secondary | ICD-10-CM

## 2016-04-22 NOTE — Progress Notes (Signed)
Pt c/o mild cracking sensation in substernal chest area during cool down exercises, using 1 pound hand weights.  Pt denies pain or discomfort.  Pt reports he only feels the sensation when hyperextending arms and not noticed this sensation at home with other activies. Pt advised to avoid hand weights at this time. Dr. Wallie Renshaw office made aware who advised Dr. Maudie Mercury, local cardiologist, should be notified.  Attempt to call Dr. Maudie Mercury with no answer.  Fax report sent.  Will continue to monitor.

## 2016-04-25 ENCOUNTER — Encounter (HOSPITAL_COMMUNITY): Payer: Medicare Other

## 2016-04-27 ENCOUNTER — Encounter (HOSPITAL_COMMUNITY): Payer: Medicare Other

## 2016-04-29 ENCOUNTER — Encounter (HOSPITAL_COMMUNITY): Payer: Medicare Other

## 2016-05-02 ENCOUNTER — Encounter (HOSPITAL_COMMUNITY)
Admission: RE | Admit: 2016-05-02 | Discharge: 2016-05-02 | Disposition: A | Discharge status: Home / Self Care | Payer: Medicare Other | Source: Ambulatory Visit | Attending: Cardiology | Admitting: Cardiology

## 2016-05-02 DIAGNOSIS — Z951 Presence of aortocoronary bypass graft: Secondary | ICD-10-CM

## 2016-05-02 DIAGNOSIS — Z48812 Encounter for surgical aftercare following surgery on the circulatory system: Secondary | ICD-10-CM | POA: Diagnosis not present

## 2016-05-04 ENCOUNTER — Encounter (HOSPITAL_COMMUNITY)
Admission: RE | Admit: 2016-05-04 | Discharge: 2016-05-04 | Disposition: A | Discharge status: Home / Self Care | Payer: Medicare Other | Source: Ambulatory Visit | Attending: Cardiology | Admitting: Cardiology

## 2016-05-04 DIAGNOSIS — Z951 Presence of aortocoronary bypass graft: Secondary | ICD-10-CM

## 2016-05-04 DIAGNOSIS — Z48812 Encounter for surgical aftercare following surgery on the circulatory system: Secondary | ICD-10-CM | POA: Diagnosis not present

## 2016-05-04 NOTE — Progress Notes (Signed)
Reviewed home exercise with pt today.  Pt plans to attend [employer] at [facility] [employer] [facility] for exercise, 2x/week in addition to coming to [facility] [place].  Reviewed THR, pulse, RPE, sign and symptoms, and when to call 911 or MD.  Also discussed weather considerations and indoor options.  Pt voiced understanding.    [name] [name] [employer]

## 2016-05-05 NOTE — Progress Notes (Deleted)
     HPI: 71 year old male for evaluation of coronary artery disease. Patient apparently had a non-ST elevation myocardial infarction in [place] in November 2017. He ultimately had coronary artery bypass and graft with a LIMA to the LAD, saphenous vein graft to the first marginal and saphenous vein graft to the right coronary artery. Initially seen by [facility] but now requests fu here.   Current Outpatient Prescriptions  Medication Sig Dispense Refill  . acetaminophen (TYLENOL) 650 MG CR tablet Take 650 mg by mouth QID.    [name] aspirin EC 81 MG tablet Take 81 mg by mouth daily.    . clopidogrel (PLAVIX) 75 MG tablet Take 75 mg by mouth once.     . senna (SENOKOT) 8.6 MG tablet Take 1 tablet by mouth 2 (two) times daily as needed for constipation.    . traMADol (ULTRAM) 50 MG tablet Take 1-2 tablets (50-100 mg total) by mouth every 8 (eight) hours as needed for moderate pain. 60 tablet 0   No current facility-administered medications for this visit.     No Known Allergies  Past Medical History:  Diagnosis Date  . Constipation   . Diabetes mellitus without complication ([place])    borderline/ pre diabetic  . NSTEMI (non-ST elevated myocardial infarction) ([place])    at [place]  . Stroke ([place])    L side - 40 years ago    Past Surgical History:  Procedure Laterality Date  . CARDIAC CATHETERIZATION    . CORONARY ARTERY BYPASS GRAFT     at [place] History  . Marital status: Married    Spouse name: N/A  . Number of children: N/A  . Years of education: N/A   Occupational History  . Not on file.   Social History Main Topics  . Smoking status: Former Smoker    Quit date: 1970  . Smokeless tobacco: Never Used  . Alcohol use No  . Drug use: No  . Sexual activity: Not on file   Other Topics Concern  . Not on file   Social History Narrative  . No narrative on file    No family history on file.  ROS: no fevers or chills, productive cough,  hemoptysis, dysphasia, odynophagia, melena, hematochezia, dysuria, hematuria, rash, seizure activity, orthopnea, PND, pedal edema, claudication. Remaining systems are negative.  Physical Exam:   There were no vitals taken for this visit.  General:  Well developed/well nourished in NAD Skin warm/dry Patient not depressed No peripheral clubbing Back-normal HEENT-normal/normal eyelids Neck supple/normal carotid upstroke bilaterally; no bruits; no JVD; no thyromegaly chest - CTA/ normal expansion CV - RRR/normal S1 and S2; no murmurs, rubs or gallops;  PMI nondisplaced Abdomen -NT/ND, no HSM, no mass, + bowel sounds, no bruit 2+ femoral pulses, no bruits Ext-no edema, chords, 2+ DP Neuro-grossly nonfocal  ECG

## 2016-05-06 ENCOUNTER — Encounter (HOSPITAL_COMMUNITY)
Admission: RE | Admit: 2016-05-06 | Discharge: 2016-05-06 | Disposition: A | Discharge status: Home / Self Care | Payer: Medicare Other | Source: Ambulatory Visit | Attending: Cardiology | Admitting: Cardiology

## 2016-05-06 DIAGNOSIS — Z48812 Encounter for surgical aftercare following surgery on the circulatory system: Secondary | ICD-10-CM | POA: Diagnosis not present

## 2016-05-06 DIAGNOSIS — Z951 Presence of aortocoronary bypass graft: Secondary | ICD-10-CM

## 2016-05-09 ENCOUNTER — Encounter (HOSPITAL_COMMUNITY)
Admission: RE | Admit: 2016-05-09 | Discharge: 2016-05-09 | Disposition: A | Discharge status: Home / Self Care | Payer: Medicare Other | Source: Ambulatory Visit | Attending: Cardiology | Admitting: Cardiology

## 2016-05-09 DIAGNOSIS — Z48812 Encounter for surgical aftercare following surgery on the circulatory system: Secondary | ICD-10-CM | POA: Diagnosis not present

## 2016-05-09 DIAGNOSIS — Z951 Presence of aortocoronary bypass graft: Secondary | ICD-10-CM

## 2016-05-11 ENCOUNTER — Encounter (HOSPITAL_COMMUNITY)
Admission: RE | Admit: 2016-05-11 | Discharge: 2016-05-11 | Disposition: A | Discharge status: Home / Self Care | Payer: Medicare Other | Source: Ambulatory Visit | Attending: Cardiology | Admitting: Cardiology

## 2016-05-11 DIAGNOSIS — Z951 Presence of aortocoronary bypass graft: Secondary | ICD-10-CM

## 2016-05-11 DIAGNOSIS — Z48812 Encounter for surgical aftercare following surgery on the circulatory system: Secondary | ICD-10-CM | POA: Diagnosis not present

## 2016-05-11 NOTE — Progress Notes (Signed)
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  acetaminophen (TYLENOL) 650 MG CR tablet, Take 650 mg by mouth QID., Disp: , Rfl:  .  aspirin EC 81 MG tablet, Take 81 mg by mouth daily., Disp: , Rfl:  .  clopidogrel (PLAVIX) 75 MG tablet, Take 75 mg by mouth once. , Disp: , Rfl:  .  senna (SENOKOT) 8.6 MG tablet, Take 1 tablet by mouth 2 (two) times daily as needed for constipation., Disp: , Rfl:  .  traMADol (ULTRAM) 50 MG tablet, Take 1-2 tablets (50-100 mg total) by mouth every 8 (eight) hours as needed for moderate pain., Disp: 60 tablet, Rfl: 0  Past Medical History: Past Medical History:  Diagnosis Date  . Constipation   . Diabetes mellitus without complication ([place])    borderline/ pre diabetic  . NSTEMI (non-ST elevated myocardial infarction) ([place])    at [place]  . Stroke ([place])    L side - 40 years ago    Tobacco Use: History  Smoking Status  . Former Smoker  . Quit date: 1970  Smokeless Tobacco  . Never Used    Labs: Recent Review Flowsheet Data    There is no flowsheet data to display.      Capillary Blood Glucose: Lab Results  Component Value Date   GLUCAP 138 (H) 04/15/2016     Exercise Target Goals:    Exercise Program Goal: Individual exercise prescription set with THRR, safety & activity barriers. Participant demonstrates ability to understand and report RPE using  BORG scale, to self-measure pulse accurately, and to acknowledge the importance of the exercise prescription.  Exercise Prescription Goal: Starting with aerobic activity 30 plus minutes a day, 3 days per week for initial exercise prescription. Provide home exercise prescription and guidelines that participant acknowledges understanding prior to discharge.  Activity Barriers & Risk Stratification:     Activity Barriers & Cardiac Risk Stratification - 04/07/16 0918      Activity Barriers & Cardiac Risk Stratification   Activity Barriers Deconditioning;Muscular Weakness;Joint Problems;Other (comment)   Comments limited shoulder mobility (B), L sided stroke 40 years ago and L knee/leg discomfort   Cardiac Risk Stratification High      6 Minute Walk:     6 Minute Walk    Row Name 04/07/16 1125 04/07/16 1126 04/07/16 1248     6 Minute Walk   Phase Initial (P)  Initial Initial   Distance  - (P)  878 feet 878 feet   Walk Time  - (P)  6 minutes 6 minutes   # of Rest Breaks  - (P)  0 0   RPE  -  - 11   Symptoms  -  - No   Resting HR  -  - 97 bpm   Resting BP  -  - 100/80   Max Ex. HR  -  - 110 bpm  Max Ex. BP  -  - 114/86   2 Minute Post BP  -  - 108/76   Row Name 04/07/16 1251         6 Minute Walk   MPH 1.68     METS 2.1     VO2 Peak 7.48        Initial Exercise Prescription:     Initial Exercise Prescription - 04/07/16 1200      Date of Initial Exercise RX and Referring Provider   Date 04/07/16   Referring Provider Clista Bernhardt MD, Loreta Ave MD     Recumbant Bike   Level 1   Minutes 10   METs 1     NuStep   Level 1   Minutes 10   METs 1     Track   Laps 6   Minutes 10   METs 2.03     Prescription Details   Frequency (times per week) 3   Duration Progress to 30 minutes of continuous aerobic without signs/symptoms of physical distress     Intensity   THRR 40-80% of Max Heartrate 60-120   Ratings of Perceived Exertion 11-13   Perceived Dyspnea 0-4      Progression   Progression Continue progressive overload as per policy without signs/symptoms or physical distress.     Resistance Training   Training Prescription Yes   Weight 1lb   Reps 10-12      Perform Capillary Blood Glucose checks as needed.  Exercise Prescription Changes:     Exercise Prescription Changes    Row Name 05/09/16 1600             Exercise Review   Progression Yes         Response to Exercise   Blood Pressure (Admit) 102/64       Blood Pressure (Exercise) 140/80       Blood Pressure (Exit) 104/66       Heart Rate (Admit) 104 bpm       Heart Rate (Exercise) 128 bpm       Heart Rate (Exit) 88 bpm       Rating of Perceived Exertion (Exercise) 11       Symptoms none        Comments reviewed HEP on 05/04/16       Duration Progress to 30 minutes of continuous aerobic without signs/symptoms of physical distress       Intensity THRR unchanged         Progression   Average METs 2.9         Resistance Training   Training Prescription Yes       Weight 1lb       Reps 10-12         Recumbant Bike   Level 1       Minutes 10       METs 2         NuStep   Level 4       Minutes 10       METs 3.1         Track   Laps 10       Minutes 10       METs 2.74         Home Exercise Plan   Plans to continue exercise at [employer] (comment)  [employer]- see progress note       Frequency Add 2 additional days to program exercise sessions.  Exercise Comments:     Exercise Comments    Row Name 05/09/16 1650           Exercise Comments Reviewed METs and goals. Pt is tolerating exercise well; will continue to monitor exercise progression.          Discharge Exercise Prescription (Final Exercise Prescription Changes):     Exercise Prescription Changes - 05/09/16 1600      Exercise Review   Progression Yes     Response to Exercise   Blood Pressure (Admit) 102/64   Blood Pressure (Exercise) 140/80   Blood Pressure  (Exit) 104/66   Heart Rate (Admit) 104 bpm   Heart Rate (Exercise) 128 bpm   Heart Rate (Exit) 88 bpm   Rating of Perceived Exertion (Exercise) 11   Symptoms none    Comments reviewed HEP on 05/04/16   Duration Progress to 30 minutes of continuous aerobic without signs/symptoms of physical distress   Intensity THRR unchanged     Progression   Average METs 2.9     Resistance Training   Training Prescription Yes   Weight 1lb   Reps 10-12     Recumbant Bike   Level 1   Minutes 10   METs 2     NuStep   Level 4   Minutes 10   METs 3.1     Track   Laps 10   Minutes 10   METs 2.74     Home Exercise Plan   Plans to continue exercise at [employer] (comment)  [employer]- see progress note   Frequency Add 2 additional days to program exercise sessions.      Nutrition:  Target Goals: Understanding of nutrition guidelines, daily intake of sodium 1500mg , cholesterol 200mg , calories 30% from fat and 7% or less from saturated fats, daily to have 5 or more servings of fruits and vegetables.  Biometrics:     Pre Biometrics - 04/07/16 1249      Pre Biometrics   Height 5' 5.25" (1.657 m)   Weight 161 lb 6 oz (73.2 kg)   Waist Circumference 37 inches   Hip Circumference 35.5 inches   Waist to Hip Ratio 1.04 %   BMI (Calculated) 26.7   Triceps Skinfold 12 mm   % Body Fat 25.2 %   Grip Strength 34 kg   Flexibility 7.5 in   Single Leg Stand 0 seconds       Nutrition Therapy Plan and Nutrition Goals:     Nutrition Therapy & Goals - 04/20/16 1357      Nutrition Therapy   Diet Therapeutic Lifestyle Changes     Personal Nutrition Goals   Personal Goal #1 1-2 lb wt loss/week to a goal wt loss of 6-10 lb at graduation from [facility], educate and counsel regarding individualized specific dietary modifications aiming towards targeted core components such as weight, hypertension, lipid management, diabetes,  heart failure and other comorbidities.   Expected Outcomes Short Term Goal: Understand basic principles of dietary content, such as calories, fat, sodium, cholesterol and nutrients.;Long Term Goal: Adherence to prescribed nutrition plan.      Nutrition Discharge: Nutrition Scores:   Nutrition Goals Re-Evaluation:   Psychosocial: Target Goals: Acknowledge presence or absence of depression, maximize coping skills, provide positive support system. Participant is able to verbalize types and ability to use techniques and skills needed for reducing stress and depression.  Initial Review & Psychosocial Screening:  Initial Psych Review & Screening - 04/07/16 1558      Initial Review   Current issues with Current Stress Concerns   Source of Stress Concerns Unable to participate in former interests or hobbies;Chronic Illness   Comments pt rates his low level of stress related to work and health     [place]? Yes   Comments Pt wife accompanied him for his orientation appt.     Barriers   Psychosocial barriers to participate in program The patient should benefit from training in stress management and relaxation.      Quality of Life Scores:     Quality of Life - 04/07/16 1346      Quality of Life Scores   Health/Function Pre 23.8 %   Socioeconomic Pre 25.33 %   Psych/Spiritual Pre 25.71 %   Family Pre 25.2 %   GLOBAL Pre 24.7 %      PHQ-9: Recent Review Flowsheet Data    Depression screen [facility] 2/9 04/15/2016   Decreased Interest 0   Down, Depressed, Hopeless 0   PHQ - 2 Score 0      Psychosocial Evaluation and Intervention:     Psychosocial Evaluation - 04/15/16 1158      Psychosocial Evaluation & Interventions   Interventions Encouraged to exercise with the program and follow exercise prescription;Relaxation education;Stress management education   Comments no psychosocial needs identified, no intervention necessary    Continued  Psychosocial Services Needed No      Psychosocial Re-Evaluation:     Psychosocial Re-Evaluation    [place] Name 05/06/16 0824             Psychosocial Re-Evaluation   Interventions Encouraged to attend Cardiac Rehabilitation for the exercise;Stress management education;Relaxation education       Comments pt c/o "clicking sternum"  is resolving with following sternal precautions more closely. pt is walking at home.  pt has upcoming cardiology appt with Dr. Stanford Breed 05/16/16 to establish local care. pt hobby is restoring hot rods with his wife.  no psychosocial needs identified, no interventions necessary.         Continued Psychosocial Services Needed No          Vocational Rehabilitation: Provide vocational rehab assistance to qualifying candidates.   Vocational Rehab Evaluation & Intervention:     Vocational Rehab - 04/15/16 [id number]      Initial Vocational Rehab Evaluation & Intervention   Assessment shows need for Vocational Rehabilitation Yes   Vocational Rehab Packet given to patient 04/15/16      Education: Education Goals: Education classes will be provided on a weekly basis, covering required topics. Participant will state understanding/return demonstration of topics presented.  Learning Barriers/Preferences:     Learning Barriers/Preferences - 04/07/16 [id number]      Learning Barriers/Preferences   Learning Barriers Sight   Learning Preferences Written Material;Pictoral;Individual Instruction      Education Topics: Count Your Pulse:  -Group instruction provided by verbal instruction, demonstration, patient participation and written materials to support subject.  Instructors address importance of being able to find your pulse and how to count your pulse when at home without a heart monitor.  Patients get hands on experience counting their pulse with staff help and individually.   Heart Attack, Angina, and Risk Factor Modification:  -Group instruction provided by verbal  instruction, video, and written materials to support subject.  Instructors address signs and symptoms of angina and heart attacks.    Also discuss risk  factors for heart disease and how to make changes to improve heart health risk factors. Flowsheet Row CARDIAC REHAB PHASE II EXERCISE from 05/11/2016 in [facility]  Date  05/04/16  Instruction Review Code  2- meets goals/outcomes      Functional Fitness:  -Group instruction provided by verbal instruction, demonstration, patient participation, and written materials to support subject.  Instructors address safety measures for doing things around the house.  Discuss how to get up and down off the floor, how to pick things up properly, how to safely get out of a chair without assistance, and balance training.   Meditation and Mindfulness:  -Group instruction provided by verbal instruction, patient participation, and written materials to support subject.  Instructor addresses importance of mindfulness and meditation practice to help reduce stress and improve awareness.  Instructor also leads participants through a meditation exercise.    Stretching for Flexibility and Mobility:  -Group instruction provided by verbal instruction, patient participation, and written materials to support subject.  Instructors lead participants through series of stretches that are designed to increase flexibility thus improving mobility.  These stretches are additional exercise for major muscle groups that are typically performed during regular warm up and cool down. Flowsheet Row CARDIAC REHAB PHASE II EXERCISE from 05/11/2016 in [facility]  Date  05/06/16  Instruction Review Code  1- partially meets, needs review/practice      Hands Only CPR Anytime:  -Group instruction provided by verbal instruction, video, patient participation and written materials to support subject.  Instructors co-teach with AHA video  for hands only CPR.  Participants get hands on experience with mannequins.   Nutrition I class: Heart Healthy Eating:  -Group instruction provided by PowerPoint slides, verbal discussion, and written materials to support subject matter. The instructor gives an explanation and review of the Therapeutic Lifestyle Changes diet recommendations, which includes a discussion on lipid goals, dietary fat, sodium, fiber, plant stanol/sterol esters, sugar, and the components of a well-balanced, healthy diet.   Nutrition II class: Lifestyle Skills:  -Group instruction provided by PowerPoint slides, verbal discussion, and written materials to support subject matter. The instructor gives an explanation and review of label reading, grocery shopping for heart health, heart healthy recipe modifications, and ways to make healthier choices when eating out.   Diabetes Question & Answer:  -Group instruction provided by PowerPoint slides, verbal discussion, and written materials to support subject matter. The instructor gives an explanation and review of diabetes co-morbidities, pre- and post-prandial blood glucose goals, pre-exercise blood glucose goals, signs, symptoms, and treatment of hypoglycemia and hyperglycemia, and foot care basics. Flowsheet Row CARDIAC REHAB PHASE II EXERCISE from 05/11/2016 in [facility]  Date  04/22/16  Instruction Review Code  2- meets goals/outcomes      Diabetes Blitz:  -Group instruction provided by PowerPoint slides, verbal discussion, and written materials to support subject matter. The instructor gives an explanation and review of the physiology behind type 1 and type 2 diabetes, diabetes medications and rational behind using different medications, pre- and post-prandial blood glucose recommendations and Hemoglobin A1c goals, diabetes diet, and exercise including blood glucose guidelines for exercising safely.    Portion Distortion:  -Group  instruction provided by PowerPoint slides, verbal discussion, written materials, and food models to support subject matter. The instructor gives an explanation of serving size versus portion size, changes in portions sizes over the last 20 years, and what consists of a serving  from each food group.   Stress Management:  -Group instruction provided by verbal instruction, video, and written materials to support subject matter.  Instructors review role of stress in heart disease and how to cope with stress positively.     Exercising on Your Own:  -Group instruction provided by verbal instruction, power point, and written materials to support subject.  Instructors discuss benefits of exercise, components of exercise, frequency and intensity of exercise, and end points for exercise.  Also discuss use of nitroglycerin and activating EMS.  Review options of places to exercise outside of rehab.  Review guidelines for sex with heart disease. Flowsheet Row CARDIAC REHAB PHASE II EXERCISE from 05/11/2016 in [facility]  Date  05/11/16  Instruction Review Code  2- meets goals/outcomes      Cardiac Drugs I:  -Group instruction provided by verbal instruction and written materials to support subject.  Instructor reviews cardiac drug classes: antiplatelets, anticoagulants, beta blockers, and statins.  Instructor discusses reasons, side effects, and lifestyle considerations for each drug class.   Cardiac Drugs II:  -Group instruction provided by verbal instruction and written materials to support subject.  Instructor reviews cardiac drug classes: angiotensin converting enzyme inhibitors (ACE-I), angiotensin II receptor blockers (ARBs), nitrates, and calcium channel blockers.  Instructor discusses reasons, side effects, and lifestyle considerations for each drug class. Flowsheet Row CARDIAC REHAB PHASE II EXERCISE from 05/11/2016 in [facility]  Date   04/20/16  Instruction Review Code  2- meets goals/outcomes      Anatomy and Physiology of the Circulatory System:  -Group instruction provided by verbal instruction, video, and written materials to support subject.  Reviews functional anatomy of heart, how it relates to various diagnoses, and what role the heart plays in the overall system.   Knowledge Questionnaire Score:     Knowledge Questionnaire Score - 04/07/16 1125      Knowledge Questionnaire Score   Pre Score 23/24      Core Components/Risk Factors/Patient Goals at Admission:     Personal Goals and Risk Factors at Admission - 04/07/16 1606      Core Components/Risk Factors/Patient Goals on Admission   Diabetes --  pre diabetic      Core Components/Risk Factors/Patient Goals Review:      Goals and Risk Factor Review    Row Name 05/09/16 1650             Core Components/Risk Factors/Patient Goals Review   Personal Goals Review Other;Increase Strength and Stamina       Review Reivewed HEP. Pt plans to go to [facility] 2x/week in addition to coming to [facility]. Discussed emergency precautions. Pt verbalized understanding.       Expected Outcomes Pt will be complaint with HEP and increase strength and stamina.          Core Components/Risk Factors/Patient Goals at Discharge (Final Review):      Goals and Risk Factor Review - 05/09/16 1650      Core Components/Risk Factors/Patient Goals Review   Personal Goals Review Other;Increase Strength and Stamina   Review Reivewed HEP. Pt plans to go to [facility] 2x/week in addition to coming to [facility]. Discussed emergency precautions. Pt verbalized understanding.   Expected Outcomes Pt will be complaint with HEP and increase strength and stamina.      ITP Comments:     ITP Comments    Row Name 04/07/16 [phone]  ITP Comments Dr. Loreta Ave, Medical Director           Comments: Pt is making expected progress toward personal goals after  completing 10 sessions. Recommend continued exercise and life style modification education including  stress management and relaxation techniques to decrease cardiac risk profile.

## 2016-05-13 ENCOUNTER — Ambulatory Visit: Payer: Self-pay | Admitting: Cardiology

## 2016-05-13 ENCOUNTER — Encounter (HOSPITAL_COMMUNITY): Payer: Medicare Other

## 2016-05-16 ENCOUNTER — Encounter (HOSPITAL_COMMUNITY)
Admission: RE | Admit: 2016-05-16 | Discharge: 2016-05-16 | Disposition: A | Discharge status: Home / Self Care | Payer: Medicare Other | Source: Ambulatory Visit | Attending: Cardiology | Admitting: Cardiology

## 2016-05-16 DIAGNOSIS — Z951 Presence of aortocoronary bypass graft: Secondary | ICD-10-CM | POA: Diagnosis present

## 2016-05-16 DIAGNOSIS — Z9889 Other specified postprocedural states: Secondary | ICD-10-CM | POA: Insufficient documentation

## 2016-05-16 DIAGNOSIS — E119 Type 2 diabetes mellitus without complications: Secondary | ICD-10-CM | POA: Insufficient documentation

## 2016-05-16 DIAGNOSIS — Z48812 Encounter for surgical aftercare following surgery on the circulatory system: Secondary | ICD-10-CM | POA: Diagnosis present

## 2016-05-16 DIAGNOSIS — Z87891 Personal history of nicotine dependence: Secondary | ICD-10-CM | POA: Diagnosis not present

## 2016-05-16 NOTE — Progress Notes (Signed)
[name] [name] 71 y.o. male Nutrition Note Spoke with pt. Pt reports he is happy with his wt at 162 lb at this time. Per pt, "I weighed 180 lb before my surgery and got down to 150 lb." Nutrition Survey reviewed with pt. Pt is following Step 2 of the Therapeutic Lifestyle Changes diet. Pt was started on Metformin, which has now been discontinued. Per discussion, pt checked his CBG's for "about a month after my surgery." Pt expressed understanding of the information reviewed. Pt aware of nutrition education classes offered. No results found for: HGBA1C Wt Readings from Last 3 Encounters:  04/07/16 161 lb 6 oz (73.2 kg)  03/31/16 160 lb (72.6 kg)   Nutrition Diagnosis ? Food-and nutrition-related knowledge deficit related to lack of exposure to information as related to diagnosis of: ? CVD  Nutrition Intervention ? Benefits of adopting Therapeutic Lifestyle Changes discussed when [facility] reviewed. ? Pt to attend the Portion Distortion class ? Pt given handouts for: ? Nutrition I class ? Nutrition II class ? Continue client-centered nutrition education by RD, as part of interdisciplinary care.  Goal(s) ? Pt to describe the benefit of including fruits, vegetables, whole grains, and low-fat dairy products in a heart healthy meal plan.  Monitor and Evaluate progress toward nutrition goal with team.  Derek Mound, M.Ed, RD, LDN, CDE 05/16/2016 8:50 AM

## 2016-05-17 ENCOUNTER — Other Ambulatory Visit: Payer: Self-pay | Admitting: Family Medicine

## 2016-05-17 NOTE — Progress Notes (Signed)
HPI: 71 yo male for evaluation of coronary artery disease. Patient was recently in [place] and had a non-ST elevation myocardial infarction. He ultimately underwent coronary artery bypass graft in November 2017 with a LIMA to the LAD, saphenous vein graft to the first marginal and saphenous vein graft to the distal right coronary artery. Patient now denies dyspnea, chest pain, palpitations or syncope. No claudication.  Current Outpatient Prescriptions  Medication Sig Dispense Refill  . acetaminophen (TYLENOL) 650 MG CR tablet Take 650 mg by mouth QID.    [name] aspirin EC 81 MG tablet Take 81 mg by mouth daily.    . clopidogrel (PLAVIX) 75 MG tablet Take 75 mg by mouth once.     . senna (SENOKOT) 8.6 MG tablet Take 1 tablet by mouth 2 (two) times daily as needed for constipation.    . traMADol (ULTRAM) 50 MG tablet TAKE 1 TO 2 TABLETS BY MOUTH EVERY 8 HOURS AS NEEDED FOR MODERTATE PAIN 60 tablet 0   No current facility-administered medications for this visit.     No Known Allergies   Past Medical History:  Diagnosis Date  . Constipation   . Diabetes mellitus without complication ([place])    borderline/ pre diabetic  . NSTEMI (non-ST elevated myocardial infarction) ([place])    at [place]  . Stroke ([place])    L side - 40 years ago    Past Surgical History:  Procedure Laterality Date  . CARDIAC CATHETERIZATION    . CORONARY ARTERY BYPASS GRAFT     at [place] History  . Marital status: Married    Spouse name: N/A  . Number of children: 2  . Years of education: N/A   Occupational History  . Not on file.   Social History Main Topics  . Smoking status: Former Smoker    Quit date: 04/11/1968  . Smokeless tobacco: Never Used  . Alcohol use Yes     Comment: Rare  . Drug use: No  . Sexual activity: Not on file   Other Topics Concern  . Not on file   Social History Narrative  . No narrative on file    Family History  Problem Relation  Age of Onset  . Family history unknown: Yes    ROS: no fevers or chills, productive cough, hemoptysis, dysphasia, odynophagia, melena, hematochezia, dysuria, hematuria, rash, seizure activity, orthopnea, PND, pedal edema, claudication. Remaining systems are negative.  Physical Exam:   Blood pressure (!) 89/53, pulse 72, height 5\' 5"  (1.651 m), weight 162 lb (73.5 kg).  General:  Well developed/well nourished in NAD Skin warm/dry Patient not depressed No peripheral clubbing Back-normal HEENT-normal/normal eyelids Neck supple/normal carotid upstroke bilaterally; no bruits; no JVD; no thyromegaly chest - CTA/ normal expansion, s/p sternotomy CV - RRR/normal S1 and S2; no murmurs, rubs or gallops;  PMI nondisplaced Abdomen -NT/ND, no HSM, no mass, + bowel sounds, + bruit 2+ femoral pulses, no bruits Ext-no edema, chords, 2+ DP Neuro-grossly nonfocal  ECG -sinus rhythm at a rate of 72. Nonspecific ST changes.  A/P  1 coronary artery disease status post coronary artery bypassing graft-patient had a recent non-ST elevation myocardial infarction in [place]. Continue aspirin. Continue Plavix until November 2018 and then discontinue. Add statin. He apparently did not feel well with Lipitor. I will try Crestor 20 mg daily. Check lipids and liver in 4 weeks. We will obtain all records from [facility] concerning his bypass and LV function.  2 bruit-schedule abdominal ultrasound to exclude aneurysm.  3 hyperlipidemia-add statin as outlined above.  Kirk Ruths, MD

## 2016-05-18 ENCOUNTER — Encounter (INDEPENDENT_AMBULATORY_CARE_PROVIDER_SITE_OTHER): Payer: Self-pay

## 2016-05-18 ENCOUNTER — Encounter (HOSPITAL_COMMUNITY)
Admission: RE | Admit: 2016-05-18 | Discharge: 2016-05-18 | Disposition: A | Discharge status: Home / Self Care | Payer: Medicare Other | Source: Ambulatory Visit | Attending: Cardiology | Admitting: Cardiology

## 2016-05-18 DIAGNOSIS — Z951 Presence of aortocoronary bypass graft: Secondary | ICD-10-CM

## 2016-05-18 DIAGNOSIS — Z48812 Encounter for surgical aftercare following surgery on the circulatory system: Secondary | ICD-10-CM | POA: Diagnosis not present

## 2016-05-18 NOTE — Telephone Encounter (Signed)
Refilled per [facility] instructions. This is 2nd refill, not to exceed 5 prior to 10/2016. Will forward to [place] for her review.

## 2016-05-18 NOTE — Telephone Encounter (Signed)
CALLED TO [facility]

## 2016-05-19 ENCOUNTER — Telehealth: Payer: Self-pay

## 2016-05-19 ENCOUNTER — Other Ambulatory Visit: Payer: Self-pay | Admitting: Family Medicine

## 2016-05-19 ENCOUNTER — Telehealth: Payer: Self-pay | Admitting: *Deleted

## 2016-05-19 NOTE — Telephone Encounter (Signed)
I spoke to pharmacist and it is al l straightened out.

## 2016-05-19 NOTE — Telephone Encounter (Signed)
Pt wife was told that the tramadol was filled and we advised pt that it was sent to [facility] pharmacy

## 2016-05-19 NOTE — Telephone Encounter (Signed)
Patient's wife called stating [facility] told them they would need a authorization on the medication that was sent. Patient requested this to be done today, I made them aware I will let the nurse know but I am unsure if it will be completed today. Please advise [phone]

## 2016-05-19 NOTE — Telephone Encounter (Signed)
See last 2 notes, I called [facility] to [facility]/18/[facility], reception let wife know

## 2016-05-19 NOTE — Telephone Encounter (Signed)
Pt is needing a refill on tramadol and wife would like a return call   Best number [phone]

## 2016-05-20 ENCOUNTER — Encounter (HOSPITAL_COMMUNITY)
Admission: RE | Admit: 2016-05-20 | Discharge: 2016-05-20 | Disposition: A | Discharge status: Home / Self Care | Payer: Medicare Other | Source: Ambulatory Visit | Attending: Cardiology | Admitting: Cardiology

## 2016-05-20 DIAGNOSIS — Z951 Presence of aortocoronary bypass graft: Secondary | ICD-10-CM

## 2016-05-20 DIAGNOSIS — Z48812 Encounter for surgical aftercare following surgery on the circulatory system: Secondary | ICD-10-CM | POA: Diagnosis not present

## 2016-05-23 ENCOUNTER — Encounter (HOSPITAL_COMMUNITY)
Admission: RE | Admit: 2016-05-23 | Discharge: 2016-05-23 | Disposition: A | Discharge status: Home / Self Care | Payer: Medicare Other | Source: Ambulatory Visit | Attending: Cardiology | Admitting: Cardiology

## 2016-05-23 ENCOUNTER — Ambulatory Visit (INDEPENDENT_AMBULATORY_CARE_PROVIDER_SITE_OTHER): Payer: Medicare Other | Admitting: Cardiology

## 2016-05-23 ENCOUNTER — Encounter: Payer: Self-pay | Admitting: Cardiology

## 2016-05-23 VITALS — BP 89/53 | HR 72 | Ht 65.0 in | Wt 162.0 lb

## 2016-05-23 DIAGNOSIS — Z951 Presence of aortocoronary bypass graft: Secondary | ICD-10-CM

## 2016-05-23 DIAGNOSIS — I25810 Atherosclerosis of coronary artery bypass graft(s) without angina pectoris: Secondary | ICD-10-CM

## 2016-05-23 DIAGNOSIS — R0989 Other specified symptoms and signs involving the circulatory and respiratory systems: Secondary | ICD-10-CM | POA: Diagnosis not present

## 2016-05-23 DIAGNOSIS — Z48812 Encounter for surgical aftercare following surgery on the circulatory system: Secondary | ICD-10-CM | POA: Diagnosis not present

## 2016-05-23 MED ORDER — ROSUVASTATIN CALCIUM 20 MG PO TABS: 20.0000 mg | ORAL_TABLET | Freq: Every day | ORAL | 3 refills | Status: DC

## 2016-05-23 NOTE — Patient Instructions (Signed)
Medication Instructions:   START ROSUVASTATIN 20 MG ONCE DAILY  Labwork:  Your physician recommends that you return for lab work in: 4 WEEKS=[address]=[facility]=DO NOT EAT PRIOR TO LAB WORK  Testing/Procedures:  Your physician has requested that you have an abdominal aorta duplex. During this test, an ultrasound is used to evaluate the aorta. Allow 30 minutes for this exam. Do not eat after midnight the day before and avoid carbonated beverages   Follow-Up:  Your physician recommends that you schedule a follow-up appointment in: [place]

## 2016-05-25 ENCOUNTER — Encounter (HOSPITAL_COMMUNITY)
Admission: RE | Admit: 2016-05-25 | Discharge: 2016-05-25 | Disposition: A | Discharge status: Home / Self Care | Payer: Medicare Other | Source: Ambulatory Visit | Attending: Cardiology | Admitting: Cardiology

## 2016-05-25 ENCOUNTER — Telehealth: Payer: Self-pay | Admitting: Cardiology

## 2016-05-25 DIAGNOSIS — Z48812 Encounter for surgical aftercare following surgery on the circulatory system: Secondary | ICD-10-CM | POA: Diagnosis not present

## 2016-05-25 DIAGNOSIS — Z951 Presence of aortocoronary bypass graft: Secondary | ICD-10-CM

## 2016-05-25 NOTE — Telephone Encounter (Signed)
Faxed Release signed by patient to [place] - to obtain records per Dr Jacalyn Lefevre request. lp

## 2016-05-25 NOTE — Telephone Encounter (Signed)
error 

## 2016-05-27 ENCOUNTER — Encounter (HOSPITAL_COMMUNITY)
Admission: RE | Admit: 2016-05-27 | Discharge: 2016-05-27 | Disposition: A | Discharge status: Home / Self Care | Payer: Medicare Other | Source: Ambulatory Visit | Attending: Cardiology | Admitting: Cardiology

## 2016-05-27 DIAGNOSIS — Z48812 Encounter for surgical aftercare following surgery on the circulatory system: Secondary | ICD-10-CM | POA: Diagnosis not present

## 2016-05-27 DIAGNOSIS — Z951 Presence of aortocoronary bypass graft: Secondary | ICD-10-CM

## 2016-05-30 ENCOUNTER — Encounter (HOSPITAL_COMMUNITY)
Admission: RE | Admit: 2016-05-30 | Discharge: 2016-05-30 | Disposition: A | Discharge status: Home / Self Care | Payer: Medicare Other | Source: Ambulatory Visit | Attending: Cardiology | Admitting: Cardiology

## 2016-05-30 DIAGNOSIS — Z48812 Encounter for surgical aftercare following surgery on the circulatory system: Secondary | ICD-10-CM | POA: Diagnosis not present

## 2016-05-30 DIAGNOSIS — Z951 Presence of aortocoronary bypass graft: Secondary | ICD-10-CM

## 2016-06-01 ENCOUNTER — Telehealth (HOSPITAL_COMMUNITY): Payer: Self-pay | Admitting: Family Medicine

## 2016-06-01 ENCOUNTER — Encounter (HOSPITAL_COMMUNITY): Payer: Medicare Other

## 2016-06-01 NOTE — Telephone Encounter (Signed)
Pt cancelled due to sickness.... [name] [name]

## 2016-06-03 ENCOUNTER — Encounter (HOSPITAL_COMMUNITY)
Admission: RE | Admit: 2016-06-03 | Discharge: 2016-06-03 | Disposition: A | Discharge status: Home / Self Care | Payer: Medicare Other | Source: Ambulatory Visit | Attending: Cardiology | Admitting: Cardiology

## 2016-06-03 DIAGNOSIS — Z951 Presence of aortocoronary bypass graft: Secondary | ICD-10-CM

## 2016-06-03 DIAGNOSIS — Z48812 Encounter for surgical aftercare following surgery on the circulatory system: Secondary | ICD-10-CM | POA: Diagnosis not present

## 2016-06-06 ENCOUNTER — Encounter (HOSPITAL_COMMUNITY): Payer: Medicare Other

## 2016-06-08 ENCOUNTER — Encounter (HOSPITAL_COMMUNITY)
Admission: RE | Admit: 2016-06-08 | Discharge: 2016-06-08 | Disposition: A | Discharge status: Home / Self Care | Payer: Medicare Other | Source: Ambulatory Visit | Attending: Cardiology | Admitting: Cardiology

## 2016-06-08 DIAGNOSIS — Z48812 Encounter for surgical aftercare following surgery on the circulatory system: Secondary | ICD-10-CM | POA: Diagnosis not present

## 2016-06-08 DIAGNOSIS — Z951 Presence of aortocoronary bypass graft: Secondary | ICD-10-CM

## 2016-06-08 NOTE — Progress Notes (Signed)
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  acetaminophen (TYLENOL) 650 MG CR tablet, Take 650 mg by mouth QID., Disp: , Rfl:  .  aspirin EC 81 MG tablet, Take 81 mg by mouth daily., Disp: , Rfl:  .  clopidogrel (PLAVIX) 75 MG tablet, Take 75 mg by mouth once. , Disp: , Rfl:  .  rosuvastatin (CRESTOR) 20 MG tablet, Take 1 tablet (20 mg total) by mouth daily., Disp: 90 tablet, Rfl: 3 .  senna (SENOKOT) 8.6 MG tablet, Take 1 tablet by mouth 2 (two) times daily as needed for constipation., Disp: , Rfl:  .  traMADol (ULTRAM) 50 MG tablet, TAKE 1 TO 2 TABLETS BY MOUTH EVERY 8 HOURS AS NEEDED FOR MODERTATE PAIN, Disp: 60 tablet, Rfl: 0  Past Medical History: Past Medical History:  Diagnosis Date  . Constipation   . Diabetes mellitus without complication ([place])    borderline/ pre diabetic  . NSTEMI (non-ST elevated myocardial infarction) ([place])    at [place]  . Stroke ([place])    L side - 40 years ago    Tobacco Use: History  Smoking Status  . Former Smoker  . Quit date: 04/11/1968  Smokeless Tobacco  . Never Used    Labs: Recent Review Flowsheet Data    There is no flowsheet data to display.      Capillary Blood Glucose: Lab Results  Component Value Date   GLUCAP 138 (H) 04/15/2016     Exercise Target Goals:    Exercise Program Goal: Individual exercise prescription set with THRR, safety  & activity barriers. Participant demonstrates ability to understand and report RPE using BORG scale, to self-measure pulse accurately, and to acknowledge the importance of the exercise prescription.  Exercise Prescription Goal: Starting with aerobic activity 30 plus minutes a day, 3 days per week for initial exercise prescription. Provide home exercise prescription and guidelines that participant acknowledges understanding prior to discharge.  Activity Barriers & Risk Stratification:     Activity Barriers & Cardiac Risk Stratification - 04/07/16 0918      Activity Barriers & Cardiac Risk Stratification   Activity Barriers Deconditioning;Muscular Weakness;Joint Problems;Other (comment)   Comments limited shoulder mobility (B), L sided stroke 40 years ago and L knee/leg discomfort   Cardiac Risk Stratification High      6 Minute Walk:     6 Minute Walk    Row Name 04/07/16 1125 04/07/16 1126 04/07/16 1248     6 Minute Walk   Phase Initial (P)  Initial Initial   Distance  - (P)  878 feet 878 feet   Walk Time  - (P)  6 minutes 6 minutes   # of Rest Breaks  - (P)  0 0   RPE  -  - 11   Symptoms  -  - No   Resting HR  -  - 97 bpm   Resting  BP  -  - 100/80   Max Ex. HR  -  - 110 bpm   Max Ex. BP  -  - 114/86   2 Minute Post BP  -  - 108/76   Row Name 04/07/16 1251         6 Minute Walk   MPH 1.68     METS 2.1     VO2 Peak 7.48        Oxygen Initial Assessment:   Oxygen Re-Evaluation:   Oxygen Discharge (Final Oxygen Re-Evaluation):   Initial Exercise Prescription:     Initial Exercise Prescription - 04/07/16 1200      Date of Initial Exercise RX and Referring Provider   Date 04/07/16   Referring Provider Clista Bernhardt MD, Loreta Ave MD     Recumbant Bike   Level 1   Minutes 10   METs 1     NuStep   Level 1   Minutes 10   METs 1     Track   Laps 6   Minutes 10   METs 2.03     Prescription Details   Frequency (times per week) 3   Duration Progress  to 30 minutes of continuous aerobic without signs/symptoms of physical distress     Intensity   THRR 40-80% of Max Heartrate 60-120   Ratings of Perceived Exertion 11-13   Perceived Dyspnea 0-4     Progression   Progression Continue progressive overload as per policy without signs/symptoms or physical distress.     Resistance Training   Training Prescription Yes   Weight 1lb   Reps 10-12      Perform Capillary Blood Glucose checks as needed.  Exercise Prescription Changes:      Exercise Prescription Changes    Row Name 05/09/16 1600 06/08/16 1400           Response to Exercise   Blood Pressure (Admit) 102/64 98/52      Blood Pressure (Exercise) 140/80 120/80      Blood Pressure (Exit) 104/66 100/68      Heart Rate (Admit) 104 bpm 79 bpm      Heart Rate (Exercise) 128 bpm 133 bpm      Heart Rate (Exit) 88 bpm 84 bpm      Rating of Perceived Exertion (Exercise) 11 11      Symptoms none  none      Comments reviewed HEP on 05/04/16  -      Duration Progress to 30 minutes of continuous aerobic without signs/symptoms of physical distress Progress to 30 minutes of  aerobic without signs/symptoms of physical distress      Intensity THRR unchanged THRR unchanged        Progression   Average METs 2.9 3.6        Resistance Training   Training Prescription Yes Yes      Weight 1lb 2lbs      Reps 10-12 10-15      Time  - 10 Minutes        Recumbant Bike   Level 1 3.5      Minutes 10 10      METs 2 3.2        NuStep   Level 4 5      Minutes 10 10      METs 3.1 4.2        Track   Laps 10 12      Minutes 10 10  METs 2.74 3.09        Home Exercise Plan   Plans to continue exercise at [facility] (comment)  [employer]- [facility] progress note [facility] (comment)  [employer]      Frequency Add 2 additional days to program exercise sessions. Add 3 additional days to program exercise sessions.      Initial Home Exercises Provided  - 05/09/16         Exercise Review   Progression Yes  -         Exercise Comments:      Exercise Comments    Row Name 05/09/16 1650 06/08/16 1452         Exercise Comments Reviewed METs and goals. Pt is tolerating exercise well; will continue to monitor exercise progression. Reviewed METs and goals. Pt is tolerating exercise well; will continue to monitor exercise progression.         Exercise Goals and Review:      Exercise Goals    Row Name 06/08/16 1450             Exercise Goals   Increase Physical Activity Yes       Intervention Provide advice, education, support and counseling about physical activity/exercise needs.;Develop an individualized exercise prescription for aerobic and resistive training based on initial evaluation findings, risk stratification, comorbidities and participant's personal goals.       Expected Outcomes Achievement of increased cardiorespiratory fitness and enhanced flexibility, muscular endurance and strength shown through measurements of functional capacity and personal statement of participant.       Increase Strength and Stamina Yes       Intervention Provide advice, education, support and counseling about physical activity/exercise needs.;Develop an individualized exercise prescription for aerobic and resistive training based on initial evaluation findings, risk stratification, comorbidities and participant's personal goals.       Expected Outcomes Achievement of increased cardiorespiratory fitness and enhanced flexibility, muscular endurance and strength shown through measurements of functional capacity and personal statement of participant.          Exercise Goals Re-Evaluation :     Exercise Goals Re-Evaluation    Row Name 06/08/16 1450             Exercise Goal Re-Evaluation   Exercise Goals Review Increase Physical Activity       Comments Pt stated" doing more household chores and yard work without fatigue" Pt has also increased stepper  average from a 2.0 to 4.3       Expected Outcomes Pt will continue to performed household chores/yardowrk without difficulty and continue to progress in cardiorespiratory fitness.           Discharge Exercise Prescription (Final Exercise Prescription Changes):     Exercise Prescription Changes - 06/08/16 1400      Response to Exercise   Blood Pressure (Admit) 98/52   Blood Pressure (Exercise) 120/80   Blood Pressure (Exit) 100/68   Heart Rate (Admit) 79 bpm   Heart Rate (Exercise) 133 bpm   Heart Rate (Exit) 84 bpm   Rating of Perceived Exertion (Exercise) 11   Symptoms none   Duration Progress to 30 minutes of  aerobic without signs/symptoms of physical distress   Intensity THRR unchanged     Progression   Average METs 3.6     Resistance Training   Training Prescription Yes   Weight 2lbs   Reps 10-15   Time 10 Minutes     Recumbant Bike  Level 3.5   Minutes 10   METs 3.2     NuStep   Level 5   Minutes 10   METs 4.2     Track   Laps 12   Minutes 10   METs 3.09     Home Exercise Plan   Plans to continue exercise at [facility] (comment)  [employer]   Frequency Add 3 additional days to program exercise sessions.   Initial Home Exercises Provided 05/09/16      Nutrition:  Target Goals: Understanding of nutrition guidelines, daily intake of sodium 1500mg , cholesterol 200mg , calories 30% from fat and 7% or less from saturated fats, daily to have 5 or more servings of fruits and vegetables.  Biometrics:     Pre Biometrics - 04/07/16 1249      Pre Biometrics   Height 5' 5.25" (1.657 m)   Weight 161 lb 6 oz (73.2 kg)   Waist Circumference 37 inches   Hip Circumference 35.5 inches   Waist to Hip Ratio 1.04 %   BMI (Calculated) 26.7   Triceps Skinfold 12 mm   % Body Fat 25.2 %   Grip Strength 34 kg   Flexibility 7.5 in   Single Leg Stand 0 seconds       Nutrition Therapy Plan and Nutrition Goals:     Nutrition Therapy & Goals -  04/20/16 1357      Nutrition Therapy   Diet Therapeutic Lifestyle Changes     Personal Nutrition Goals   Nutrition Goal 1-2 lb wt loss/week to a goal wt loss of 6-10 lb at graduation from [facility], educate and counsel regarding individualized specific dietary modifications aiming towards targeted core components such as weight, hypertension, lipid management, diabetes, heart failure and other comorbidities.   Expected Outcomes Short Term Goal: Understand basic principles of dietary content, such as calories, fat, sodium, cholesterol and nutrients.;Long Term Goal: Adherence to prescribed nutrition plan.      Nutrition Discharge: Nutrition Scores:     Nutrition Assessments - 05/16/16 0846      MEDFICTS Scores   Pre Score 9      Nutrition Goals Re-Evaluation:   Nutrition Goals Re-Evaluation:   Nutrition Goals Discharge (Final Nutrition Goals Re-Evaluation):   Psychosocial: Target Goals: Acknowledge presence or absence of significant depression and/or stress, maximize coping skills, provide positive support system. Participant is able to verbalize types and ability to use techniques and skills needed for reducing stress and depression.  Initial Review & Psychosocial Screening:     Initial Psych Review & Screening - 04/07/16 1558      Initial Review   Current issues with Current Stress Concerns   Source of Stress Concerns Unable to participate in former interests or hobbies;Chronic Illness   Comments pt rates his low level of stress related to work and health     [place]? Yes   Comments Pt wife accompanied him for his orientation appt.     Barriers   Psychosocial barriers to participate in program The patient should benefit from training in stress management and relaxation.      Quality of Life Scores:     Quality of Life - 04/07/16 1346      Quality of Life Scores    Health/Function Pre 23.8 %   Socioeconomic Pre 25.33 %   Psych/Spiritual Pre 25.71 %   Family Pre 25.2 %  GLOBAL Pre 24.7 %      PHQ-9: Recent Review Flowsheet Data    Depression screen [facility] 2/9 04/15/2016   Decreased Interest 0   Down, Depressed, Hopeless 0   PHQ - 2 Score 0     Interpretation of Total Score  Total Score Depression Severity:  1-4 = Minimal depression, 5-9 = Mild depression, 10-14 = Moderate depression, 15-19 = Moderately severe depression, 20-27 = Severe depression   Psychosocial Evaluation and Intervention:     Psychosocial Evaluation - 04/15/16 1158      Psychosocial Evaluation & Interventions   Interventions Encouraged to exercise with the program and follow exercise prescription;Relaxation education;Stress management education   Comments no psychosocial needs identified, no intervention necessary    Continue Psychosocial Services  No      Psychosocial Re-Evaluation:     Psychosocial Re-Evaluation    Row Name 05/06/16 0824 06/08/16 0736           Psychosocial Re-Evaluation   Comments pt c/o "clicking sternum"  is resolving with following sternal precautions more closely. pt is walking at home.  pt has upcoming cardiology appt with Dr. Stanford Breed 05/16/16 to establish local care. pt hobby is restoring hot rods with his wife.  no psychosocial needs identified, no interventions necessary.   pt sternal symptoms are resolving.  pt is walking at home.  pt and spouse encouraged by recent cardiology appt with Dr. Stanford Breed.   pt hobby is restoring hot rods with his wife.  no psychosocial needs identified, no interventions necessary.        Expected Outcomes  - pt will continue healthy habits that facilitate good coping skills and positive hopeful outlook.       Interventions Encouraged to attend Cardiac Rehabilitation for the exercise;Stress management education;Relaxation education Encouraged to attend Cardiac Rehabilitation for the exercise      Continue  Psychosocial Services  No No Follow up required         Psychosocial Discharge (Final Psychosocial Re-Evaluation):     Psychosocial Re-Evaluation - 06/08/16 0736      Psychosocial Re-Evaluation   Comments pt sternal symptoms are resolving.  pt is walking at home.  pt and spouse encouraged by recent cardiology appt with Dr. Stanford Breed.   pt hobby is restoring hot rods with his wife.  no psychosocial needs identified, no interventions necessary.     Expected Outcomes pt will continue healthy habits that facilitate good coping skills and positive hopeful outlook.    Interventions Encouraged to attend Cardiac Rehabilitation for the exercise   Continue Psychosocial Services  No Follow up required      Vocational Rehabilitation: Provide vocational rehab assistance to qualifying candidates.   Vocational Rehab Evaluation & Intervention:     Vocational Rehab - 04/15/16 [id number]      Initial Vocational Rehab Evaluation & Intervention   Assessment shows need for Vocational Rehabilitation Yes   Vocational Rehab Packet given to patient 04/15/16      Education: Education Goals: Education classes will be provided on a weekly basis, covering required topics. Participant will state understanding/return demonstration of topics presented.  Learning Barriers/Preferences:     Learning Barriers/Preferences - 04/07/16 [id number]      Learning Barriers/Preferences   Learning Barriers Sight   Learning Preferences Written Material;Pictoral;Individual Instruction      Education Topics: Count Your Pulse:  -Group instruction provided by verbal instruction, demonstration, patient participation and written materials to support subject.  Instructors address importance of being able to find your pulse  and how to count your pulse when at home without a heart monitor.  Patients get hands on experience counting their pulse with staff help and individually.   Heart Attack, Angina, and Risk Factor Modification:   -Group instruction provided by verbal instruction, video, and written materials to support subject.  Instructors address signs and symptoms of angina and heart attacks.    Also discuss risk factors for heart disease and how to make changes to improve heart health risk factors. Flowsheet Row CARDIAC REHAB PHASE II EXERCISE from 06/08/2016 in [place]  Date  05/04/16  Instruction Review Code  2- meets goals/outcomes      Functional Fitness:  -Group instruction provided by verbal instruction, demonstration, patient participation, and written materials to support subject.  Instructors address safety measures for doing things around the house.  Discuss how to get up and down off the floor, how to pick things up properly, how to safely get out of a chair without assistance, and balance training.   Meditation and Mindfulness:  -Group instruction provided by verbal instruction, patient participation, and written materials to support subject.  Instructor addresses importance of mindfulness and meditation practice to help reduce stress and improve awareness.  Instructor also leads participants through a meditation exercise.    Stretching for Flexibility and Mobility:  -Group instruction provided by verbal instruction, patient participation, and written materials to support subject.  Instructors lead participants through series of stretches that are designed to increase flexibility thus improving mobility.  These stretches are additional exercise for major muscle groups that are typically performed during regular warm up and cool down. Flowsheet Row CARDIAC REHAB PHASE II EXERCISE from 06/08/2016 in [place]  Date  06/03/16  Instruction Review Code  1- partially meets, needs review/practice      Hands Only CPR Anytime:  -Group instruction provided by verbal instruction, video, patient participation and written materials to support  subject.  Instructors co-teach with [facility] video for hands only CPR.  Participants get hands on experience with mannequins.   Nutrition I class: Heart Healthy Eating:  -Group instruction provided by PowerPoint slides, verbal discussion, and written materials to support subject matter. The instructor gives an explanation and review of the Therapeutic Lifestyle Changes diet recommendations, which includes a discussion on lipid goals, dietary fat, sodium, fiber, plant stanol/sterol esters, sugar, and the components of a well-balanced, healthy diet. Flowsheet Row CARDIAC REHAB PHASE II EXERCISE from 06/08/2016 in [place]  Date  05/16/16  Educator  RD  Instruction Review Code  Not applicable [class handouts given]      Nutrition II class: Lifestyle Skills:  -Group instruction provided by PowerPoint slides, verbal discussion, and written materials to support subject matter. The instructor gives an explanation and review of label reading, grocery shopping for heart health, heart healthy recipe modifications, and ways to make healthier choices when eating out. Flowsheet Row CARDIAC REHAB PHASE II EXERCISE from 06/08/2016 in [place]  Date  05/16/16  Educator  RD  Instruction Review Code  Not applicable [class handouts given]      Diabetes Question & Answer:  -Group instruction provided by PowerPoint slides, verbal discussion, and written materials to support subject matter. The instructor gives an explanation and review of diabetes co-morbidities, pre- and post-prandial blood glucose goals, pre-exercise blood glucose goals, signs, symptoms, and treatment of hypoglycemia and hyperglycemia, and foot care basics. [facility] REH[facility] PHASE  II EXERCISE from 06/08/2016 in [place]  Date  05/20/16  Educator  RD  Instruction Review Code  2- meets goals/outcomes      Diabetes Blitz:  -Group  instruction provided by PowerPoint slides, verbal discussion, and written materials to support subject matter. The instructor gives an explanation and review of the physiology behind type 1 and type 2 diabetes, diabetes medications and rational behind using different medications, pre- and post-prandial blood glucose recommendations and Hemoglobin A1c goals, diabetes diet, and exercise including blood glucose guidelines for exercising safely.    Portion Distortion:  -Group instruction provided by PowerPoint slides, verbal discussion, written materials, and food models to support subject matter. The instructor gives an explanation of serving size versus portion size, changes in portions sizes over the last 20 years, and what consists of a serving from each food group. Flowsheet Row CARDIAC REHAB PHASE II EXERCISE from 06/08/2016 in [place]  Date  06/08/16  Educator  RD  Instruction Review Code  2- meets goals/outcomes      Stress Management:  -Group instruction provided by verbal instruction, video, and written materials to support subject matter.  Instructors review role of stress in heart disease and how to cope with stress positively.     Exercising on Your Own:  -Group instruction provided by verbal instruction, power point, and written materials to support subject.  Instructors discuss benefits of exercise, components of exercise, frequency and intensity of exercise, and end points for exercise.  Also discuss use of nitroglycerin and activating EMS.  Review options of places to exercise outside of rehab.  Review guidelines for sex with heart disease. Flowsheet Row CARDIAC REHAB PHASE II EXERCISE from 06/08/2016 in [place]  Date  05/11/16  Instruction Review Code  2- meets goals/outcomes      Cardiac Drugs I:  -Group instruction provided by verbal instruction and written materials to support subject.  Instructor reviews  cardiac drug classes: antiplatelets, anticoagulants, beta blockers, and statins.  Instructor discusses reasons, side effects, and lifestyle considerations for each drug class. Flowsheet Row CARDIAC REHAB PHASE II EXERCISE from 06/08/2016 in [place]  Date  05/25/16  Instruction Review Code  2- meets goals/outcomes      Cardiac Drugs II:  -Group instruction provided by verbal instruction and written materials to support subject.  Instructor reviews cardiac drug classes: angiotensin converting enzyme inhibitors (ACE-I), angiotensin II receptor blockers (ARBs), nitrates, and calcium channel blockers.  Instructor discusses reasons, side effects, and lifestyle considerations for each drug class. Flowsheet Row CARDIAC REHAB PHASE II EXERCISE from 06/08/2016 in [place]  Date  04/20/16  Instruction Review Code  2- meets goals/outcomes      Anatomy and Physiology of the Circulatory System:  -Group instruction provided by verbal instruction, video, and written materials to support subject.  Reviews functional anatomy of heart, how it relates to various diagnoses, and what role the heart plays in the overall system.   Knowledge Questionnaire Score:     Knowledge Questionnaire Score - 04/07/16 1125      Knowledge Questionnaire Score   Pre Score 23/24      Core Components/Risk Factors/Patient Goals at Admission:     Personal Goals and Risk Factors at Admission - 04/07/16 1606      Core Components/Risk Factors/Patient Goals on Admission   Diabetes --  pre diabetic      Core  Components/Risk Factors/Patient Goals Review:      Goals and Risk Factor Review    Row Name 05/09/16 1650             Core Components/Risk Factors/Patient Goals Review   Personal Goals Review Other;Increase Strength and Stamina       Review Reivewed HEP. Pt plans to go to [facility] 2x/week in addition to coming to [facility]. Discussed  emergency precautions. Pt verbalized understanding.       Expected Outcomes Pt will be complaint with HEP and increase strength and stamina.          Core Components/Risk Factors/Patient Goals at Discharge (Final Review):      Goals and Risk Factor Review - 05/09/16 1650      Core Components/Risk Factors/Patient Goals Review   Personal Goals Review Other;Increase Strength and Stamina   Review Reivewed HEP. Pt plans to go to [facility] 2x/week in addition to coming to [facility]. Discussed emergency precautions. Pt verbalized understanding.   Expected Outcomes Pt will be complaint with HEP and increase strength and stamina.      ITP Comments:     ITP Comments    Row Name 04/07/16 0909           ITP Comments Dr. Loreta Ave, Medical Director           Comments: Pt is making expected progress toward personal goals after completing 18 sessions. Recommend continued exercise and life style modification education including  stress management and relaxation techniques to decrease cardiac risk profile.

## 2016-06-09 ENCOUNTER — Ambulatory Visit (HOSPITAL_COMMUNITY)
Admission: RE | Admit: 2016-06-09 | Discharge: 2016-06-09 | Disposition: A | Discharge status: Home / Self Care | Payer: Medicare Other | Source: Ambulatory Visit | Attending: Cardiology | Admitting: Cardiology

## 2016-06-09 DIAGNOSIS — R0989 Other specified symptoms and signs involving the circulatory and respiratory systems: Secondary | ICD-10-CM | POA: Diagnosis present

## 2016-06-09 DIAGNOSIS — Z87891 Personal history of nicotine dependence: Secondary | ICD-10-CM | POA: Diagnosis not present

## 2016-06-09 DIAGNOSIS — I2510 Atherosclerotic heart disease of native coronary artery without angina pectoris: Secondary | ICD-10-CM | POA: Insufficient documentation

## 2016-06-09 DIAGNOSIS — I708 Atherosclerosis of other arteries: Secondary | ICD-10-CM | POA: Insufficient documentation

## 2016-06-09 DIAGNOSIS — I700 Atherosclerosis of aorta: Secondary | ICD-10-CM | POA: Diagnosis not present

## 2016-06-09 DIAGNOSIS — Z136 Encounter for screening for cardiovascular disorders: Secondary | ICD-10-CM | POA: Diagnosis not present

## 2016-06-10 ENCOUNTER — Encounter (HOSPITAL_COMMUNITY)
Admission: RE | Admit: 2016-06-10 | Discharge: 2016-06-10 | Disposition: A | Discharge status: Home / Self Care | Payer: Medicare Other | Source: Ambulatory Visit | Attending: Cardiology | Admitting: Cardiology

## 2016-06-10 DIAGNOSIS — Z87891 Personal history of nicotine dependence: Secondary | ICD-10-CM | POA: Insufficient documentation

## 2016-06-10 DIAGNOSIS — Z9889 Other specified postprocedural states: Secondary | ICD-10-CM | POA: Diagnosis present

## 2016-06-10 DIAGNOSIS — Z48812 Encounter for surgical aftercare following surgery on the circulatory system: Secondary | ICD-10-CM | POA: Diagnosis present

## 2016-06-10 DIAGNOSIS — Z951 Presence of aortocoronary bypass graft: Secondary | ICD-10-CM | POA: Diagnosis present

## 2016-06-10 DIAGNOSIS — E119 Type 2 diabetes mellitus without complications: Secondary | ICD-10-CM | POA: Insufficient documentation

## 2016-06-13 ENCOUNTER — Encounter (HOSPITAL_COMMUNITY)
Admission: RE | Admit: 2016-06-13 | Discharge: 2016-06-13 | Disposition: A | Discharge status: Home / Self Care | Payer: Medicare Other | Source: Ambulatory Visit | Attending: Cardiology | Admitting: Cardiology

## 2016-06-13 DIAGNOSIS — Z48812 Encounter for surgical aftercare following surgery on the circulatory system: Secondary | ICD-10-CM | POA: Diagnosis not present

## 2016-06-13 DIAGNOSIS — Z951 Presence of aortocoronary bypass graft: Secondary | ICD-10-CM

## 2016-06-15 ENCOUNTER — Encounter (HOSPITAL_COMMUNITY)
Admission: RE | Admit: 2016-06-15 | Discharge: 2016-06-15 | Disposition: A | Discharge status: Home / Self Care | Payer: Medicare Other | Source: Ambulatory Visit | Attending: Cardiology | Admitting: Cardiology

## 2016-06-15 DIAGNOSIS — Z951 Presence of aortocoronary bypass graft: Secondary | ICD-10-CM

## 2016-06-15 DIAGNOSIS — Z48812 Encounter for surgical aftercare following surgery on the circulatory system: Secondary | ICD-10-CM | POA: Diagnosis not present

## 2016-06-17 ENCOUNTER — Encounter (HOSPITAL_COMMUNITY)
Admission: RE | Admit: 2016-06-17 | Discharge: 2016-06-17 | Disposition: A | Discharge status: Home / Self Care | Payer: Medicare Other | Source: Ambulatory Visit | Attending: Cardiology | Admitting: Cardiology

## 2016-06-17 DIAGNOSIS — Z951 Presence of aortocoronary bypass graft: Secondary | ICD-10-CM

## 2016-06-17 DIAGNOSIS — Z48812 Encounter for surgical aftercare following surgery on the circulatory system: Secondary | ICD-10-CM | POA: Diagnosis not present

## 2016-06-20 ENCOUNTER — Encounter (HOSPITAL_COMMUNITY): Payer: Medicare Other

## 2016-06-22 ENCOUNTER — Encounter (HOSPITAL_COMMUNITY)
Admission: RE | Admit: 2016-06-22 | Discharge: 2016-06-22 | Disposition: A | Discharge status: Home / Self Care | Payer: Medicare Other | Source: Ambulatory Visit | Attending: Cardiology | Admitting: Cardiology

## 2016-06-22 DIAGNOSIS — Z48812 Encounter for surgical aftercare following surgery on the circulatory system: Secondary | ICD-10-CM | POA: Diagnosis not present

## 2016-06-22 DIAGNOSIS — Z951 Presence of aortocoronary bypass graft: Secondary | ICD-10-CM

## 2016-06-23 ENCOUNTER — Telehealth: Payer: Self-pay | Admitting: Family Medicine

## 2016-06-23 NOTE — Telephone Encounter (Signed)
Pt wife calling for a refill on husbands Tramadol states that she's just calling early because it usely take a while for a refill please respond pt wife at [phone]

## 2016-06-23 NOTE — Telephone Encounter (Signed)
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 His wife has called in monthly over past 3 months for refills of Tramadol so therefore his pain is now chronic. He will need to come in the office to be seen. His OV 03/31/16 was a new patient visit.  Please call and advise to please schedule and appointment and establish with a provider.  Carroll Sage. Kenton Kingfisher, MSN, FNP-C [facility] [place]

## 2016-06-24 ENCOUNTER — Other Ambulatory Visit: Payer: Self-pay | Admitting: Cardiology

## 2016-06-24 ENCOUNTER — Encounter (HOSPITAL_COMMUNITY)
Admission: RE | Admit: 2016-06-24 | Discharge: 2016-06-24 | Disposition: A | Discharge status: Home / Self Care | Payer: Medicare Other | Source: Ambulatory Visit | Attending: Cardiology | Admitting: Cardiology

## 2016-06-24 DIAGNOSIS — Z48812 Encounter for surgical aftercare following surgery on the circulatory system: Secondary | ICD-10-CM | POA: Diagnosis not present

## 2016-06-24 DIAGNOSIS — Z951 Presence of aortocoronary bypass graft: Secondary | ICD-10-CM

## 2016-06-24 NOTE — Telephone Encounter (Signed)
Busy signal at home # x 2. No answer at work #

## 2016-06-24 NOTE — Telephone Encounter (Signed)
°*  STAT* If patient is at the pharmacy, call can be transferred to refill team.   1. Which medications need to be refilled? (please list name of each medication and dose if known) Plavix  2. Which pharmacy/location (including street and city if local pharmacy) is medication to be sent to?[facility] on [address]    3. Do they need a 30 day or 90 day supply? 86  Per Mrs. Settle he has only pill left

## 2016-06-25 ENCOUNTER — Ambulatory Visit (INDEPENDENT_AMBULATORY_CARE_PROVIDER_SITE_OTHER): Payer: Medicare Other | Admitting: Family Medicine

## 2016-06-25 VITALS — BP 113/72 | HR 81 | Temp 97.9°F | Resp 16 | Ht 65.0 in | Wt 160.6 lb

## 2016-06-25 DIAGNOSIS — R079 Chest pain, unspecified: Secondary | ICD-10-CM

## 2016-06-25 MED ORDER — TRAMADOL HCL 50 MG PO TABS: 50.0000 mg | ORAL_TABLET | Freq: Three times a day (TID) | ORAL | 0 refills | Status: DC | PRN

## 2016-06-25 NOTE — Patient Instructions (Addendum)
Pain management  Take 50 mg of Tramadol on cardiac rehabilitation days only. It is ok to take one tablet prior to rehab and then repeat a dose at bedtime on cardiac rehab days only.  For mild pain, take generic acetaminophen (Tylenol) 650 mg 4 times daily.  I am changing your provider to Dr. Delia Chimes, MD and she will be the provider you see here at [facility] [place] an appointment for a complete physical exam with Dr. Nolon Rod in 6 months.  IF you received an x-ray today, you will receive an invoice from [facility]. Please contact [facility] at [phone] with questions or concerns regarding your invoice.   IF you received labwork today, you will receive an invoice from [facility]. Please contact [facility] at [phone] with questions or concerns regarding your invoice.   Our billing staff will not be able to assist you with questions regarding bills from these companies.  You will be contacted with the lab results as soon as they are available. The fastest way to get your results is to activate your My Chart account. Instructions are located on the last page of this paperwork. If you have not heard from [place] regarding the results in 2 weeks, please contact this office.

## 2016-06-25 NOTE — Progress Notes (Signed)
Patient ID: [name] [name], male    DOB: 1946/01/07, 71 y.o.   MRN: [id number]  PCP: Molli Barrows, FNP  Chief Complaint  Patient presents with  . Medication Refill    Tramadol    Subjective:  HPI  [age] year old male presents for evaluation of pain and medication refill of tramadol. He has been taking medication daily regardless of type of pain. He reports that he thought it was okay for him to take the medication daily.  He cannot take brand name Tylenol as it is too "strong".  Recently switched to generic form of acetaminophen which he reports that he tolerates better.  He is prescribed daily aspirin which he reports he only takes occasionally for pain. He did not understand that aspirin is for cardiac health and antiplatelet therapy. He is still continuing cardiac rehabilitation and is scheduled three times weekly. Reports that he always takes a Tramadol prior to cardiac rehabilitation and often has chest tenderness in the evening following rehab therapy.  Social History   Social History  . Marital status: Married    Spouse name: N/A  . Number of children: 2  . Years of education: N/A   Occupational History  . Not on file.   Social History Main Topics  . Smoking status: Former Smoker    Quit date: 04/11/1968  . Smokeless tobacco: Never Used  . Alcohol use Yes     Comment: Rare  . Drug use: No  . Sexual activity: Not on file   Other Topics Concern  . Not on file   Social History Narrative  . No narrative on file    Family History  Problem Relation Age of Onset  . Family history unknown: Yes     Review of Systems  See HPI  Prior to Admission medications   Medication Sig Start Date End Date Taking? Authorizing Provider  acetaminophen (TYLENOL) 650 MG CR tablet Take 650 mg by mouth QID.   Yes Historical Provider, MD  aspirin EC 81 MG tablet Take 81 mg by mouth daily.   Yes Historical Provider, MD  clopidogrel (PLAVIX) 75 MG tablet Take 75 mg by mouth  once.  02/19/16  Yes Historical Provider, MD  senna (SENOKOT) 8.6 MG tablet Take 1 tablet by mouth 2 (two) times daily as needed for constipation.   Yes Historical Provider, MD  traMADol (ULTRAM) 50 MG tablet TAKE 1 TO 2 TABLETS BY MOUTH EVERY 8 HOURS AS NEEDED FOR MODERTATE PAIN 05/18/16  Yes Jaynee Eagles, PA-C  rosuvastatin (CRESTOR) 20 MG tablet Take 1 tablet (20 mg total) by mouth daily. Patient not taking: Reported on 06/25/2016 05/23/16 08/21/16  Lelon Perla, MD    Past Medical, Surgical Family and Social History reviewed and updated.    Objective:   Today's Vitals   06/25/16 1347  BP: 113/72  Pulse: 81  Resp: 16  Temp: 97.9 F (36.6 C)  TempSrc: Oral  SpO2: 98%  Weight: 160 lb 9.6 oz (72.8 kg)  Height: 5\' 5"  (1.651 m)    Wt Readings from Last 3 Encounters:  06/25/16 160 lb 9.6 oz (72.8 kg)  05/23/16 162 lb (73.5 kg)  04/07/16 161 lb 6 oz (73.2 kg)    Physical Exam  Constitutional: He appears well-developed and well-nourished.  HENT:  Head: Normocephalic and atraumatic.  Eyes: Pupils are equal, round, and reactive to light.  Cardiovascular: Normal rate, regular rhythm, normal heart sounds and intact distal pulses.  Exam reveals no gallop and  no friction rub.   No murmur heard. Pulmonary/Chest: Effort normal and breath sounds normal.  Skin: Skin is warm and dry.  Psychiatric: His mood appears anxious.       Assessment & Plan:  1. Chest pain, unspecifed type -Discussed proper use of pain medication and supplementation with acetaminophen. -Patient agrees and verbalized understanding to only take 50 mg tramadol, 1 tablet prior to cardiac rehabilitation and 1 tablet after therapy only.  Use acetaminophen (generic brand) for mild pain and on non rehabilitation day.  Schedule an appointment for a complete physical exam with Dr. Nolon Rod in 6 months.    Carroll Sage. Kenton Kingfisher, MSN, FNP-C [facility] [place]

## 2016-06-27 ENCOUNTER — Encounter (HOSPITAL_COMMUNITY)
Admission: RE | Admit: 2016-06-27 | Discharge: 2016-06-27 | Disposition: A | Discharge status: Home / Self Care | Payer: Medicare Other | Source: Ambulatory Visit | Attending: Cardiology | Admitting: Cardiology

## 2016-06-27 DIAGNOSIS — Z951 Presence of aortocoronary bypass graft: Secondary | ICD-10-CM

## 2016-06-27 DIAGNOSIS — Z48812 Encounter for surgical aftercare following surgery on the circulatory system: Secondary | ICD-10-CM | POA: Diagnosis not present

## 2016-06-27 MED ORDER — CLOPIDOGREL BISULFATE 75 MG PO TABS: 75.0000 mg | ORAL_TABLET | Freq: Once | ORAL | 11 refills | Status: DC

## 2016-06-29 ENCOUNTER — Encounter (HOSPITAL_COMMUNITY): Payer: Medicare Other

## 2016-07-01 ENCOUNTER — Encounter (HOSPITAL_COMMUNITY)
Admission: RE | Admit: 2016-07-01 | Discharge: 2016-07-01 | Disposition: A | Discharge status: Home / Self Care | Payer: Medicare Other | Source: Ambulatory Visit | Attending: Cardiology | Admitting: Cardiology

## 2016-07-01 DIAGNOSIS — Z951 Presence of aortocoronary bypass graft: Secondary | ICD-10-CM

## 2016-07-01 DIAGNOSIS — Z48812 Encounter for surgical aftercare following surgery on the circulatory system: Secondary | ICD-10-CM | POA: Diagnosis not present

## 2016-07-04 ENCOUNTER — Encounter (HOSPITAL_COMMUNITY)
Admission: RE | Admit: 2016-07-04 | Discharge: 2016-07-04 | Disposition: A | Discharge status: Home / Self Care | Payer: Medicare Other | Source: Ambulatory Visit | Attending: Cardiology | Admitting: Cardiology

## 2016-07-04 DIAGNOSIS — Z951 Presence of aortocoronary bypass graft: Secondary | ICD-10-CM

## 2016-07-04 DIAGNOSIS — Z48812 Encounter for surgical aftercare following surgery on the circulatory system: Secondary | ICD-10-CM | POA: Diagnosis not present

## 2016-07-05 ENCOUNTER — Ambulatory Visit: Payer: Medicare Other | Admitting: Podiatry

## 2016-07-05 NOTE — Progress Notes (Signed)
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  acetaminophen (TYLENOL) 650 MG CR tablet, Take 650 mg by mouth QID., Disp: , Rfl:  .  aspirin EC 81 MG tablet, Take 81 mg by mouth daily., Disp: , Rfl:  .  rosuvastatin (CRESTOR) 20 MG tablet, Take 1 tablet (20 mg total) by mouth daily. (Patient not taking: Reported on 06/25/2016), Disp: 90 tablet, Rfl: 3 .  senna (SENOKOT) 8.6 MG tablet, Take 1 tablet by mouth 2 (two) times daily as needed for constipation., Disp: , Rfl:  .  traMADol (ULTRAM) 50 MG tablet, Take 1 tablet (50 mg total) by mouth every 8 (eight) hours as needed for moderate pain or severe pain. For cardiac rehab days only, Disp: 60 tablet, Rfl: 0  Past Medical History: Past Medical History:  Diagnosis Date  . Constipation   . Diabetes mellitus without complication ([place])    borderline/ pre diabetic  . NSTEMI (non-ST elevated myocardial infarction) ([place])    at [place]  . Stroke ([place])    L side - 40 years ago    Tobacco Use: History  Smoking Status  . Former Smoker  . Quit date: 04/11/1968  Smokeless Tobacco  . Never Used    Labs: Recent Review Flowsheet Data    There is no flowsheet data to display.      Capillary Blood Glucose: Lab Results  Component Value Date   GLUCAP 138 (H) 04/15/2016     Exercise Target Goals:    Exercise Program Goal: Individual exercise prescription set with THRR, safety &  activity barriers. Participant demonstrates ability to understand and report RPE using BORG scale, to self-measure pulse accurately, and to acknowledge the importance of the exercise prescription.  Exercise Prescription Goal: Starting with aerobic activity 30 plus minutes a day, 3 days per week for initial exercise prescription. Provide home exercise prescription and guidelines that participant acknowledges understanding prior to discharge.  Activity Barriers & Risk Stratification:     Activity Barriers & Cardiac Risk Stratification - 04/07/16 0918      Activity Barriers & Cardiac Risk Stratification   Activity Barriers Deconditioning;Muscular Weakness;Joint Problems;Other (comment)   Comments limited shoulder mobility (B), L sided stroke 40 years ago and L knee/leg discomfort   Cardiac Risk Stratification High      6 Minute Walk:     6 Minute Walk    Row Name 04/07/16 1125 04/07/16 1126 04/07/16 1248     6 Minute Walk   Phase Initial (P)  Initial Initial   Distance  - (P)  878 feet 878 feet   Walk Time  - (P)  6 minutes 6 minutes   # of Rest Breaks  - (P)  0 0   RPE  -  - 11   Symptoms  -  - No   Resting HR  -  - 97 bpm   Resting BP  -  -  100/80   Max Ex. HR  -  - 110 bpm   Max Ex. BP  -  - 114/86   2 Minute Post BP  -  - 108/76   Row Name 04/07/16 1251         6 Minute Walk   MPH 1.68     METS 2.1     VO2 Peak 7.48        Oxygen Initial Assessment:   Oxygen Re-Evaluation:   Oxygen Discharge (Final Oxygen Re-Evaluation):   Initial Exercise Prescription:     Initial Exercise Prescription - 04/07/16 1200      Date of Initial Exercise RX and Referring Provider   Date 04/07/16   Referring Provider Clista Bernhardt MD, Loreta Ave MD     Recumbant Bike   Level 1   Minutes 10   METs 1     NuStep   Level 1   Minutes 10   METs 1     Track   Laps 6   Minutes 10   METs 2.03     Prescription Details   Frequency (times per week) 3   Duration Progress to  30 minutes of continuous aerobic without signs/symptoms of physical distress     Intensity   THRR 40-80% of Max Heartrate 60-120   Ratings of Perceived Exertion 11-13   Perceived Dyspnea 0-4     Progression   Progression Continue progressive overload as per policy without signs/symptoms or physical distress.     Resistance Training   Training Prescription Yes   Weight 1lb   Reps 10-12      Perform Capillary Blood Glucose checks as needed.  Exercise Prescription Changes:     Exercise Prescription Changes    Row Name 05/09/16 1600 06/08/16 1400 06/21/16 1400 07/04/16 1600       Response to Exercise   Blood Pressure (Admit) 102/64 98/52 104/70 108/70    Blood Pressure (Exercise) 140/80 120/80 108/62 128/70    Blood Pressure (Exit) 104/66 100/68 102/60 98/62    Heart Rate (Admit) 104 bpm 79 bpm 91 bpm 96 bpm    Heart Rate (Exercise) 128 bpm 133 bpm 124 bpm 115 bpm    Heart Rate (Exit) 88 bpm 84 bpm 90 bpm 69 bpm    Rating of Perceived Exertion (Exercise) 11 11 12 11     Symptoms none  none none none    Comments reviewed HEP on 05/04/16  -  -  -    Duration Progress to 30 minutes of continuous aerobic without signs/symptoms of physical distress Progress to 30 minutes of  aerobic without signs/symptoms of physical distress Progress to 30 minutes of  aerobic without signs/symptoms of physical distress Progress to 30 minutes of  aerobic without signs/symptoms of physical distress    Intensity THRR unchanged THRR unchanged THRR unchanged THRR unchanged      Progression   Average METs 2.9 3.6 2.8 3.1      Resistance Training   Training Prescription Yes Yes Yes Yes    Weight 1lb 2lbs 2lbs 3lbs    Reps 10-12 10-15 10-15 10-15    Time  - 10 Minutes 10 Minutes 10 Minutes      Recumbant Bike   Level 1 3.5 3.5 3.5    Minutes 10 10 10 10     METs 2 3.2 2.4 2.4      NuStep   Level 4 5 5 5     Minutes 10 10 10  10  METs 3.1 4.2 3 3.5      Track   Laps 10 12 11 14     Minutes 10  10 10 10     METs 2.74 3.09 2.92 3.43      Home Exercise Plan   Plans to continue exercise at [employer] (comment)  [employer]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 Add 3 additional days to program exercise sessions. Add 3 additional days to program exercise sessions. Add 3 additional days to program exercise sessions.    Initial Home Exercises Provided  - 05/09/16 05/09/16 05/09/16      Exercise Review   Progression Yes  -  -  -       Exercise Comments:     Exercise Comments    Row Name 05/09/16 1650 06/08/16 1452         Exercise Comments Reviewed METs and goals. Pt is tolerating exercise well; will continue to monitor exercise progression. Reviewed METs and goals. Pt is tolerating exercise well; will continue to monitor exercise progression.         Exercise Goals and Review:     Exercise Goals    Row Name 06/08/16 1450             Exercise Goals   Increase Physical Activity Yes       Intervention Provide advice, education, support and counseling about physical activity/exercise needs.;Develop an individualized exercise prescription for aerobic and resistive training based on initial evaluation findings, risk stratification, comorbidities and participant's personal goals.       Expected Outcomes Achievement of increased cardiorespiratory fitness and enhanced flexibility, muscular endurance and strength shown through measurements of functional capacity and personal statement of participant.       Increase Strength and Stamina Yes       Intervention Provide advice, education, support and counseling about physical activity/exercise needs.;Develop an individualized exercise prescription for aerobic and resistive training based on initial evaluation findings, risk  stratification, comorbidities and participant's personal goals.       Expected Outcomes Achievement of increased cardiorespiratory fitness and enhanced flexibility, muscular endurance and strength shown through measurements of functional capacity and personal statement of participant.          Exercise Goals Re-Evaluation :     Exercise Goals Re-Evaluation    Row Name 06/08/16 1450 06/21/16 1429           Exercise Goal Re-Evaluation   Exercise Goals Review Increase Physical Activity Increase Physical Activity;Increase Strenth and Stamina      Comments Pt stated" doing more household chores and yard work without fatigue" Pt has also increased stepper average from a 2.0 to 4.3 Pt has started stair climbing in addition to walking track to assist with navigating steps without symptoms of SOB and fatigue.      Expected Outcomes Pt will continue to performed household chores/yardowrk without difficulty and continue to progress in cardiorespiratory fitness. Pt will continue to improve in overall fitness and functional capacity.          Discharge Exercise Prescription (Final Exercise Prescription Changes):     Exercise Prescription Changes - 07/04/16 1600      Response to Exercise   Blood Pressure (Admit) 108/70   Blood Pressure (Exercise) 128/70   Blood Pressure (Exit) 98/62   Heart Rate (Admit)  96 bpm   Heart Rate (Exercise) 115 bpm   Heart Rate (Exit) 69 bpm   Rating of Perceived Exertion (Exercise) 11   Symptoms none   Duration Progress to 30 minutes of  aerobic without signs/symptoms of physical distress   Intensity THRR unchanged     Progression   Average METs 3.1     Resistance Training   Training Prescription Yes   Weight 3lbs   Reps 10-15   Time 10 Minutes     Recumbant Bike   Level 3.5   Minutes 10   METs 2.4     NuStep   Level 5   Minutes 10   METs 3.5     Track   Laps 14   Minutes 10   METs 3.43     Home Exercise Plan   Plans to continue exercise  at [employer] (comment)  [employer]   Frequency Add 3 additional days to program exercise sessions.   Initial Home Exercises Provided 05/09/16      Nutrition:  Target Goals: Understanding of nutrition guidelines, daily intake of sodium 1500mg , cholesterol 200mg , calories 30% from fat and 7% or less from saturated fats, daily to have 5 or more servings of fruits and vegetables.  Biometrics:     Pre Biometrics - 04/07/16 1249      Pre Biometrics   Height 5' 5.25" (1.657 m)   Weight 161 lb 6 oz (73.2 kg)   Waist Circumference 37 inches   Hip Circumference 35.5 inches   Waist to Hip Ratio 1.04 %   BMI (Calculated) 26.7   Triceps Skinfold 12 mm   % Body Fat 25.2 %   Grip Strength 34 kg   Flexibility 7.5 in   Single Leg Stand 0 seconds       Nutrition Therapy Plan and Nutrition Goals:     Nutrition Therapy & Goals - 04/20/16 1357      Nutrition Therapy   Diet Therapeutic Lifestyle Changes     Personal Nutrition Goals   Nutrition Goal 1-2 lb wt loss/week to a goal wt loss of 6-10 lb at graduation from [place], educate and counsel regarding individualized specific dietary modifications aiming towards targeted core components such as weight, hypertension, lipid management, diabetes, heart failure and other comorbidities.   Expected Outcomes Short Term Goal: Understand basic principles of dietary content, such as calories, fat, sodium, cholesterol and nutrients.;Long Term Goal: Adherence to prescribed nutrition plan.      Nutrition Discharge: Nutrition Scores:     Nutrition Assessments - 05/16/16 0846      MEDFICTS Scores   Pre Score 9      Nutrition Goals Re-Evaluation:   Nutrition Goals Re-Evaluation:   Nutrition Goals Discharge (Final Nutrition Goals Re-Evaluation):   Psychosocial: Target Goals: Acknowledge presence or absence of significant depression and/or stress, maximize coping  skills, provide positive support system. Participant is able to verbalize types and ability to use techniques and skills needed for reducing stress and depression.  Initial Review & Psychosocial Screening:     Initial Psych Review & Screening - 04/07/16 1558      Initial Review   Current issues with Current Stress Concerns   Source of Stress Concerns Unable to participate in former interests or hobbies;Chronic Illness   Comments pt rates his low level of stress related to work and health     [place]?  Yes   Comments Pt wife accompanied him for his orientation appt.     Barriers   Psychosocial barriers to participate in program The patient should benefit from training in stress management and relaxation.      Quality of Life Scores:     Quality of Life - 04/07/16 1346      Quality of Life Scores   Health/Function Pre 23.8 %   Socioeconomic Pre 25.33 %   Psych/Spiritual Pre 25.71 %   Family Pre 25.2 %   GLOBAL Pre 24.7 %      PHQ-9: Recent Review Flowsheet Data    Depression screen [facility] 2/9 06/25/2016 04/15/2016   Decreased Interest 0 0   Down, Depressed, Hopeless 0 0   PHQ - 2 Score 0 0     Interpretation of Total Score  Total Score Depression Severity:  1-4 = Minimal depression, 5-9 = Mild depression, 10-14 = Moderate depression, 15-19 = Moderately severe depression, 20-27 = Severe depression   Psychosocial Evaluation and Intervention:     Psychosocial Evaluation - 04/15/16 1158      Psychosocial Evaluation & Interventions   Interventions Encouraged to exercise with the program and follow exercise prescription;Relaxation education;Stress management education   Comments no psychosocial needs identified, no intervention necessary    Continue Psychosocial Services  No      Psychosocial Re-Evaluation:     Psychosocial Re-Evaluation    Row Name 05/06/16 0824 06/08/16 0736 07/05/16 1607         Psychosocial Re-Evaluation    Comments pt c/o "clicking sternum"  is resolving with following sternal precautions more closely. pt is walking at home.  pt has upcoming cardiology appt with Dr. Stanford Breed 05/16/16 to establish local care. pt hobby is restoring hot rods with his wife.  no psychosocial needs identified, no interventions necessary.   pt sternal symptoms are resolving.  pt is walking at home.  pt and spouse encouraged by recent cardiology appt with Dr. Stanford Breed.   pt hobby is restoring hot rods with his wife.  no psychosocial needs identified, no interventions necessary.   no psychosocial needs identified, no interventions necessary. pt is encouraged by decreased symptoms and increased physical capacity.       Expected Outcomes  - pt will continue healthy habits that facilitate good coping skills and positive hopeful outlook.  pt will continue healthy habits that facilitate good coping skills and positive hopeful outlook.      Interventions Encouraged to attend Cardiac Rehabilitation for the exercise;Stress management education;Relaxation education Encouraged to attend Cardiac Rehabilitation for the exercise Encouraged to attend Cardiac Rehabilitation for the exercise     Continue Psychosocial Services  No No Follow up required No Follow up required        Psychosocial Discharge (Final Psychosocial Re-Evaluation):     Psychosocial Re-Evaluation - 07/05/16 1607      Psychosocial Re-Evaluation   Comments no psychosocial needs identified, no interventions necessary. pt is encouraged by decreased symptoms and increased physical capacity.     Expected Outcomes pt will continue healthy habits that facilitate good coping skills and positive hopeful outlook.    Interventions Encouraged to attend Cardiac Rehabilitation for the exercise   Continue Psychosocial Services  No Follow up required      Vocational Rehabilitation: Provide vocational rehab assistance to qualifying candidates.   Vocational Rehab Evaluation &  Intervention:     Vocational Rehab - 04/15/16 1157      Initial Vocational Rehab Evaluation & Intervention  Assessment shows need for Vocational Rehabilitation Yes   Vocational Rehab Packet given to patient 04/15/16      Education: Education Goals: Education classes will be provided on a weekly basis, covering required topics. Participant will state understanding/return demonstration of topics presented.  Learning Barriers/Preferences:     Learning Barriers/Preferences - 04/07/16 1610      Learning Barriers/Preferences   Learning Barriers Sight   Learning Preferences Written Material;Pictoral;Individual Instruction      Education Topics: Count Your Pulse:  -Group instruction provided by verbal instruction, demonstration, patient participation and written materials to support subject.  Instructors address importance of being able to find your pulse and how to count your pulse when at home without a heart monitor.  Patients get hands on experience counting their pulse with staff help and individually.   Heart Attack, Angina, and Risk Factor Modification:  -Group instruction provided by verbal instruction, video, and written materials to support subject.  Instructors address signs and symptoms of angina and heart attacks.    Also discuss risk factors for heart disease and how to make changes to improve heart health risk factors.   CARDIAC REHAB PHASE II EXERCISE from 06/22/2016 in [place]  Date  05/04/16  Instruction Review Code  2- meets goals/outcomes      Functional Fitness:  -Group instruction provided by verbal instruction, demonstration, patient participation, and written materials to support subject.  Instructors address safety measures for doing things around the house.  Discuss how to get up and down off the floor, how to pick things up properly, how to safely get out of a chair without assistance, and balance training.   Meditation  and Mindfulness:  -Group instruction provided by verbal instruction, patient participation, and written materials to support subject.  Instructor addresses importance of mindfulness and meditation practice to help reduce stress and improve awareness.  Instructor also leads participants through a meditation exercise.    Stretching for Flexibility and Mobility:  -Group instruction provided by verbal instruction, patient participation, and written materials to support subject.  Instructors lead participants through series of stretches that are designed to increase flexibility thus improving mobility.  These stretches are additional exercise for major muscle groups that are typically performed during regular warm up and cool down.   CARDIAC REHAB PHASE II EXERCISE from 06/22/2016 in [place]  Date  06/03/16  Instruction Review Code  1- partially meets, needs review/practice      Hands Only CPR Anytime:  -Group instruction provided by verbal instruction, video, patient participation and written materials to support subject.  Instructors co-teach with [facility] video for hands only CPR.  Participants get hands on experience with mannequins.   Nutrition I class: Heart Healthy Eating:  -Group instruction provided by PowerPoint slides, verbal discussion, and written materials to support subject matter. The instructor gives an explanation and review of the Therapeutic Lifestyle Changes diet recommendations, which includes a discussion on lipid goals, dietary fat, sodium, fiber, plant stanol/sterol esters, sugar, and the components of a well-balanced, healthy diet.   CARDIAC REHAB PHASE II EXERCISE from 06/22/2016 in [place]  Date  05/16/16  Educator  RD  Instruction Review Code  Not applicable [class handouts given]      Nutrition II class: Lifestyle Skills:  -Group instruction provided by PowerPoint slides, verbal discussion, and written  materials to support subject matter. The instructor gives an explanation and review of label reading, grocery shopping  for heart health, heart healthy recipe modifications, and ways to make healthier choices when eating out.   CARDIAC REHAB PHASE II EXERCISE from 06/22/2016 in [place]  Date  05/16/16  Educator  RD  Instruction Review Code  Not applicable [class handouts given]      Diabetes Question & Answer:  -Group instruction provided by PowerPoint slides, verbal discussion, and written materials to support subject matter. The instructor gives an explanation and review of diabetes co-morbidities, pre- and post-prandial blood glucose goals, pre-exercise blood glucose goals, signs, symptoms, and treatment of hypoglycemia and hyperglycemia, and foot care basics.   CARDIAC REHAB PHASE II EXERCISE from 06/22/2016 in [place]  Date  05/20/16  Educator  RD  Instruction Review Code  2- meets goals/outcomes      Diabetes Blitz:  -Group instruction provided by PowerPoint slides, verbal discussion, and written materials to support subject matter. The instructor gives an explanation and review of the physiology behind type 1 and type 2 diabetes, diabetes medications and rational behind using different medications, pre- and post-prandial blood glucose recommendations and Hemoglobin A1c goals, diabetes diet, and exercise including blood glucose guidelines for exercising safely.    Portion Distortion:  -Group instruction provided by PowerPoint slides, verbal discussion, written materials, and food models to support subject matter. The instructor gives an explanation of serving size versus portion size, changes in portions sizes over the last 20 years, and what consists of a serving from each food group.   CARDIAC REHAB PHASE II EXERCISE from 06/22/2016 in [place]  Date  06/08/16  Educator  RD   Instruction Review Code  2- meets goals/outcomes      Stress Management:  -Group instruction provided by verbal instruction, video, and written materials to support subject matter.  Instructors review role of stress in heart disease and how to cope with stress positively.     CARDIAC REHAB PHASE II EXERCISE from 06/22/2016 in [place]  Date  06/15/16  Instruction Review Code  2- meets goals/outcomes      Exercising on Your Own:  -Group instruction provided by verbal instruction, power point, and written materials to support subject.  Instructors discuss benefits of exercise, components of exercise, frequency and intensity of exercise, and end points for exercise.  Also discuss use of nitroglycerin and activating EMS.  Review options of places to exercise outside of rehab.  Review guidelines for sex with heart disease.   CARDIAC REHAB PHASE II EXERCISE from 06/22/2016 in [place]  Date  05/11/16  Instruction Review Code  2- meets goals/outcomes      Cardiac Drugs I:  -Group instruction provided by verbal instruction and written materials to support subject.  Instructor reviews cardiac drug classes: antiplatelets, anticoagulants, beta blockers, and statins.  Instructor discusses reasons, side effects, and lifestyle considerations for each drug class.   CARDIAC REHAB PHASE II EXERCISE from 06/22/2016 in [place]  Date  05/25/16  Instruction Review Code  2- meets goals/outcomes      Cardiac Drugs II:  -Group instruction provided by verbal instruction and written materials to support subject.  Instructor reviews cardiac drug classes: angiotensin converting enzyme inhibitors (ACE-I), angiotensin II receptor blockers (ARBs), nitrates, and calcium channel blockers.  Instructor discusses reasons, side effects, and lifestyle considerations for each drug class.   CARDIAC REHAB PHASE II EXERCISE  from 06/22/2016 in  [place]  Date  04/20/16  Instruction Review Code  2- meets goals/outcomes      Anatomy and Physiology of the Circulatory System:  -Group instruction provided by verbal instruction, video, and written materials to support subject.  Reviews functional anatomy of heart, how it relates to various diagnoses, and what role the heart plays in the overall system.   Knowledge Questionnaire Score:     Knowledge Questionnaire Score - 04/07/16 1125      Knowledge Questionnaire Score   Pre Score 23/24      Core Components/Risk Factors/Patient Goals at Admission:     Personal Goals and Risk Factors at Admission - 04/07/16 1606      Core Components/Risk Factors/Patient Goals on Admission   Diabetes --  pre diabetic      Core Components/Risk Factors/Patient Goals Review:      Goals and Risk Factor Review    Row Name 05/09/16 1650 07/05/16 1605           Core Components/Risk Factors/Patient Goals Review   Personal Goals Review Other;Increase Strength and Stamina Other      Review Reivewed HEP. Pt plans to go to [facility] 2x/week in addition to coming to [facility]. Discussed emergency precautions. Pt verbalized understanding. pt fear of falling from weakness and unsteady gait is resolving. pt has incorporated stairclimbing into his CR exercise routine.        Expected Outcomes Pt will be complaint with HEP and increase strength and stamina. Pt will be complaint with HEP and increase strength and stamina.         Core Components/Risk Factors/Patient Goals at Discharge (Final Review):      Goals and Risk Factor Review - 07/05/16 1605      Core Components/Risk Factors/Patient Goals Review   Personal Goals Review Other   Review pt fear of falling from weakness and unsteady gait is resolving. pt has incorporated stairclimbing into his CR exercise routine.     Expected Outcomes Pt will be complaint with HEP and increase strength  and stamina.      ITP Comments:     ITP Comments    Row Name 04/07/16 0909           ITP Comments Dr. Loreta Ave, Medical Director           Comments: Pt is making expected progress toward personal goals after completing 28 sessions. Recommend continued exercise and life style modification education including  stress management and relaxation techniques to decrease cardiac risk profile.

## 2016-07-06 ENCOUNTER — Encounter (HOSPITAL_COMMUNITY)
Admission: RE | Admit: 2016-07-06 | Discharge: 2016-07-06 | Disposition: A | Discharge status: Home / Self Care | Payer: Medicare Other | Source: Ambulatory Visit | Attending: Cardiology | Admitting: Cardiology

## 2016-07-06 DIAGNOSIS — Z48812 Encounter for surgical aftercare following surgery on the circulatory system: Secondary | ICD-10-CM | POA: Diagnosis not present

## 2016-07-06 DIAGNOSIS — Z951 Presence of aortocoronary bypass graft: Secondary | ICD-10-CM

## 2016-07-08 ENCOUNTER — Encounter (HOSPITAL_COMMUNITY)
Admission: RE | Admit: 2016-07-08 | Discharge: 2016-07-08 | Disposition: A | Discharge status: Home / Self Care | Payer: Medicare Other | Source: Ambulatory Visit | Attending: Cardiology | Admitting: Cardiology

## 2016-07-08 VITALS — Ht 65.25 in | Wt 162.3 lb

## 2016-07-08 DIAGNOSIS — Z48812 Encounter for surgical aftercare following surgery on the circulatory system: Secondary | ICD-10-CM | POA: Diagnosis not present

## 2016-07-08 DIAGNOSIS — Z951 Presence of aortocoronary bypass graft: Secondary | ICD-10-CM

## 2016-07-11 ENCOUNTER — Encounter (HOSPITAL_COMMUNITY)
Admission: RE | Admit: 2016-07-11 | Discharge: 2016-07-11 | Disposition: A | Discharge status: Home / Self Care | Payer: Medicare Other | Source: Ambulatory Visit | Attending: Cardiology | Admitting: Cardiology

## 2016-07-11 DIAGNOSIS — E119 Type 2 diabetes mellitus without complications: Secondary | ICD-10-CM | POA: Insufficient documentation

## 2016-07-11 DIAGNOSIS — Z9889 Other specified postprocedural states: Secondary | ICD-10-CM | POA: Insufficient documentation

## 2016-07-11 DIAGNOSIS — Z87891 Personal history of nicotine dependence: Secondary | ICD-10-CM | POA: Insufficient documentation

## 2016-07-11 DIAGNOSIS — Z951 Presence of aortocoronary bypass graft: Secondary | ICD-10-CM | POA: Diagnosis present

## 2016-07-11 DIAGNOSIS — Z48812 Encounter for surgical aftercare following surgery on the circulatory system: Secondary | ICD-10-CM | POA: Insufficient documentation

## 2016-07-13 ENCOUNTER — Encounter (HOSPITAL_COMMUNITY): Admission: RE | Admit: 2016-07-13 | Payer: Medicare Other | Source: Ambulatory Visit

## 2016-07-15 ENCOUNTER — Encounter (HOSPITAL_COMMUNITY)
Admission: RE | Admit: 2016-07-15 | Discharge: 2016-07-15 | Disposition: A | Discharge status: Home / Self Care | Payer: Medicare Other | Source: Ambulatory Visit | Attending: Cardiology | Admitting: Cardiology

## 2016-07-15 DIAGNOSIS — Z951 Presence of aortocoronary bypass graft: Secondary | ICD-10-CM

## 2016-07-15 DIAGNOSIS — Z48812 Encounter for surgical aftercare following surgery on the circulatory system: Secondary | ICD-10-CM | POA: Diagnosis not present

## 2016-07-18 ENCOUNTER — Encounter (HOSPITAL_COMMUNITY)
Admission: RE | Admit: 2016-07-18 | Discharge: 2016-07-18 | Disposition: A | Discharge status: Home / Self Care | Payer: Medicare Other | Source: Ambulatory Visit | Attending: Cardiology | Admitting: Cardiology

## 2016-07-18 DIAGNOSIS — Z951 Presence of aortocoronary bypass graft: Secondary | ICD-10-CM

## 2016-07-18 DIAGNOSIS — Z48812 Encounter for surgical aftercare following surgery on the circulatory system: Secondary | ICD-10-CM | POA: Diagnosis not present

## 2016-07-20 ENCOUNTER — Encounter (HOSPITAL_COMMUNITY)
Admission: RE | Admit: 2016-07-20 | Discharge: 2016-07-20 | Disposition: A | Discharge status: Home / Self Care | Payer: Medicare Other | Source: Ambulatory Visit | Attending: Cardiology | Admitting: Cardiology

## 2016-07-20 DIAGNOSIS — Z951 Presence of aortocoronary bypass graft: Secondary | ICD-10-CM

## 2016-07-20 DIAGNOSIS — Z48812 Encounter for surgical aftercare following surgery on the circulatory system: Secondary | ICD-10-CM | POA: Diagnosis not present

## 2016-07-22 ENCOUNTER — Encounter (HOSPITAL_COMMUNITY): Payer: Medicare Other

## 2016-07-25 ENCOUNTER — Encounter (HOSPITAL_COMMUNITY)
Admission: RE | Admit: 2016-07-25 | Discharge: 2016-07-25 | Disposition: A | Discharge status: Home / Self Care | Payer: Medicare Other | Source: Ambulatory Visit | Attending: Cardiology | Admitting: Cardiology

## 2016-07-25 DIAGNOSIS — Z951 Presence of aortocoronary bypass graft: Secondary | ICD-10-CM

## 2016-07-25 DIAGNOSIS — Z48812 Encounter for surgical aftercare following surgery on the circulatory system: Secondary | ICD-10-CM | POA: Diagnosis not present

## 2016-07-27 ENCOUNTER — Encounter (HOSPITAL_COMMUNITY): Payer: Self-pay

## 2016-07-27 ENCOUNTER — Encounter (HOSPITAL_COMMUNITY)
Admission: RE | Admit: 2016-07-27 | Discharge: 2016-07-27 | Disposition: A | Discharge status: Home / Self Care | Payer: Medicare Other | Source: Ambulatory Visit | Attending: Cardiology | Admitting: Cardiology

## 2016-07-27 VITALS — Ht 65.25 in | Wt 162.3 lb

## 2016-07-27 DIAGNOSIS — Z48812 Encounter for surgical aftercare following surgery on the circulatory system: Secondary | ICD-10-CM | POA: Diagnosis not present

## 2016-07-27 DIAGNOSIS — Z951 Presence of aortocoronary bypass graft: Secondary | ICD-10-CM

## 2016-07-27 NOTE — Progress Notes (Signed)
Discharge Summary  Patient Details  Name: [name] [name] MRN: [id number] Date of Birth: 28-Jun-1945 Referring Provider:     [place] from 04/07/2016 in [place]  Referring Provider  Clista Bernhardt MD, Loreta Ave MD       Number of Visits: 36   Reason for Discharge:  Patient reached a stable level of exercise.  Pt plans to exercise on  His own at [facility], walking,  stairclimbing and using stationary bike  at home.  Pt feels he has met his goals with significant increase in his functional ability, strength/stamina and decreased health related anxiety.  Pt is able to return to some previous hobbies.    Smoking History:  History  Smoking Status  . Former Smoker  . Quit date: 04/11/1968  Smokeless Tobacco  . 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  ADL UCSD:   Initial Exercise Prescription:     Initial Exercise Prescription - 04/07/16 1200      Date of Initial Exercise RX and Referring Provider   Date 04/07/16   Referring Provider Clista Bernhardt MD, Loreta Ave MD     Recumbant Bike   Level 1   Minutes 10   METs 1     NuStep   Level 1   Minutes 10   METs 1     Track   Laps 6   Minutes 10   METs 2.03     Prescription Details   Frequency (times per week) 3   Duration Progress to 30 minutes of continuous aerobic without signs/symptoms of physical distress     Intensity   THRR 40-80% of Max Heartrate 60-120   Ratings of Perceived Exertion 11-13   Perceived Dyspnea 0-4     Progression   Progression Continue progressive overload as per policy without signs/symptoms or physical distress.     Resistance Training   Training Prescription Yes   Weight 1lb   Reps 10-12      Discharge Exercise Prescription (Final Exercise Prescription Changes):     Exercise Prescription Changes - 07/27/16 1017      Response to Exercise   Blood Pressure (Admit) 120/74   Blood Pressure (Exercise) 150/80   Blood  Pressure (Exit) 136/78   Heart Rate (Admit) 92 bpm   Heart Rate (Exercise) 126 bpm   Heart Rate (Exit) 79 bpm   Rating of Perceived Exertion (Exercise) 11   Symptoms none   Duration Continue with 30 min of aerobic exercise without signs/symptoms of physical distress.   Intensity THRR unchanged     Progression   Average METs 3.1     Resistance Training   Training Prescription Yes   Weight 0   Reps 10-15   Time 10 Minutes     Recumbant Bike   Level 3.5   Minutes 10   METs 2.4     NuStep   Level 5   Minutes 10   METs 3.9     Track   Laps 11   Minutes 10   METs 2.91     Home Exercise Plan   Plans to continue exercise at [employer] (comment)  [employer]   Frequency Add 3 additional days to program exercise sessions.   Initial Home Exercises Provided 05/09/16     Exercise Review   Progression Yes      Functional Capacity:     6 Minute Walk  [place] Name 04/07/16 1125 04/07/16 1126 04/07/16 1248     6 Minute Walk   Phase Initial (P)  Initial Initial   Distance  - (P)  878 feet 878 feet   Walk Time  - (P)  6 minutes 6 minutes   # of Rest Breaks  - (P)  0 0   RPE  -  - 11   Symptoms  -  - No   Resting HR  -  - 97 bpm   Resting BP  -  - 100/80   Max Ex. HR  -  - 110 bpm   Max Ex. BP  -  - 114/86   2 Minute Post BP  -  - 108/76   Row Name 04/07/16 1251 07/08/16 1003 08/10/16 1041     6 Minute Walk   Phase  - Discharge  -   Distance  - 1572 feet  -   Distance % Change  -  - 79.04 %   Walk Time  - 6 minutes  -   # of Rest Breaks  - 0  -   MPH 1.68 2.97  -   METS 2.1 3.26  -   RPE  - 11  -   VO2 Peak 7.48 11.4  -   Symptoms  - No  -   Resting HR  - 91 bpm  -   Resting BP  - 98/70  -   Max Ex. HR  - 115 bpm  -   Max Ex. BP  - 100/64  -   2 Minute Post BP  - 100/70  -      Psychological, QOL, Others - Outcomes: PHQ 2/9: Depression screen [facility] 2/9 07/27/2016 06/25/2016 04/15/2016  Decreased Interest 0 0 0  Down, Depressed, Hopeless 0 0 0   PHQ - 2 Score 0 0 0    Quality of Life:     Quality of Life - 07/27/16 0956      Quality of Life Scores   Health/Function Pre 23.8 %   Health/Function Post 27.6 %   Health/Function % Change 15.97 %   Socioeconomic Pre 25.33 %   Socioeconomic Post 27.21 %   Socioeconomic % Change  7.42 %   Psych/Spiritual Pre 25.71 %   Psych/Spiritual Post 30 %   Psych/Spiritual % Change 16.69 %   Family Pre 25.2 %   Family Post 28.8 %   Family % Change 14.29 %   GLOBAL Pre 24.7 %   GLOBAL Post 28.19 %   GLOBAL % Change 14.13 %      Personal Goals: Goals established at orientation with interventions provided to work toward goal.     Personal Goals and Risk Factors at Admission - 08/10/16 1023      Core Components/Risk Factors/Patient Goals on Admission   Personal Goal Pt has improved in balance and walking tolerance. Pt has returned to most household chores and activities and back to working on cars   Expected Outcomes Pt will continue to improve in balance and functional capacity. Pt has also increased confidence with physical activity and exercise.       Personal Goals Discharge:     Goals and Risk Factor Review    Row Name 05/09/16 1650 07/05/16 1605 08/19/16 1046         Core Components/Risk Factors/Patient Goals Review   Personal Goals Review Other;Increase Strength and Stamina Other Weight Management/Obesity     Review Reivewed HEP. Pt plans to  go to [employer] 2x/week in addition to coming to [facility]. Discussed emergency precautions. Pt verbalized understanding. pt fear of falling from weakness and unsteady gait is resolving. pt has incorporated stairclimbing into his CR exercise routine.   Pt is overweight according to his BMI. Pt wt loss goal of 6-10 lb not met. Pt maintained his wt while in Cardiac Rehab.     Expected Outcomes Pt will be complaint with HEP and increase strength and stamina. Pt will be complaint with HEP and increase strength and stamina.  -         Nutrition & Weight - Outcomes:     Pre Biometrics - 04/07/16 1249      Pre Biometrics   Height 5' 5.25" (1.657 m)   Weight 161 lb 6 oz (73.2 kg)   Waist Circumference 37 inches   Hip Circumference 35.5 inches   Waist to Hip Ratio 1.04 %   BMI (Calculated) 26.7   Triceps Skinfold 12 mm   % Body Fat 25.2 %   Grip Strength 34 kg   Flexibility 7.5 in   Single Leg Stand 0 seconds         Post Biometrics - 08/10/16 1022       Post  Biometrics   Height 5' 5.25" (1.657 m)   Weight 162 lb 4.1 oz (73.6 kg)   Waist Circumference 36.75 inches   Hip Circumference 35.25 inches   Waist to Hip Ratio 1.04 %   BMI (Calculated) 26.9   Triceps Skinfold 18 mm   % Body Fat 26.8 %   Grip Strength 36 kg   Single Leg Stand 4.21 seconds      Nutrition:     Nutrition Therapy & Goals - 04/20/16 1357      Nutrition Therapy   Diet Therapeutic Lifestyle Changes     Personal Nutrition Goals   Nutrition Goal 1-2 lb wt loss/week to a goal wt loss of 6-10 lb at graduation from [place], educate and counsel regarding individualized specific dietary modifications aiming towards targeted core components such as weight, hypertension, lipid management, diabetes, heart failure and other comorbidities.   Expected Outcomes Short Term Goal: Understand basic principles of dietary content, such as calories, fat, sodium, cholesterol and nutrients.;Long Term Goal: Adherence to prescribed nutrition plan.      Nutrition Discharge:     Nutrition Assessments - 08/19/16 1045      MEDFICTS Scores   Pre Score 9   Post Score 0   Score Difference -9      Education Questionnaire Score:     Knowledge Questionnaire Score - 07/27/16 8184      Knowledge Questionnaire Score   Post Score 22/24      Goals reviewed with patient; copy given to patient.

## 2016-07-29 ENCOUNTER — Encounter (HOSPITAL_COMMUNITY): Payer: Medicare Other

## 2016-08-01 ENCOUNTER — Encounter (HOSPITAL_COMMUNITY): Payer: Medicare Other

## 2016-08-02 ENCOUNTER — Encounter: Payer: Self-pay | Admitting: Cardiology

## 2016-08-03 ENCOUNTER — Encounter (HOSPITAL_COMMUNITY): Payer: Medicare Other

## 2016-08-05 ENCOUNTER — Encounter (HOSPITAL_COMMUNITY): Payer: Medicare Other

## 2016-08-08 ENCOUNTER — Encounter (HOSPITAL_COMMUNITY): Payer: Medicare Other

## 2016-08-10 ENCOUNTER — Encounter (HOSPITAL_COMMUNITY): Payer: Medicare Other

## 2016-08-12 ENCOUNTER — Encounter (HOSPITAL_COMMUNITY): Payer: Medicare Other

## 2016-08-15 ENCOUNTER — Encounter (HOSPITAL_COMMUNITY): Payer: Medicare Other

## 2016-08-16 NOTE — Progress Notes (Signed)
      HPI: FU coronary artery disease. Patient was previously in [place] and had a non-ST elevation myocardial infarction. He ultimately underwent coronary artery bypass graft in November 2017 with a LIMA to the LAD, saphenous vein graft to the first marginal and saphenous vein graft to the distal right coronary artery. Abdominal ultrasound March 2018 showed no aneurysm. Since last seen, patient denies exertional chest pain, dyspnea, palpitations or syncope. He has some residual chest soreness from previous bypass surgery.  Current Outpatient Prescriptions  Medication Sig Dispense Refill  . acetaminophen (TYLENOL) 650 MG CR tablet Take 650 mg by mouth QID.    [name] aspirin EC 81 MG tablet Take 81 mg by mouth daily.    [name] senna (SENOKOT) 8.6 MG tablet Take 1 tablet by mouth 2 (two) times daily as needed for constipation.    . traMADol (ULTRAM) 50 MG tablet Take 1 tablet (50 mg total) by mouth every 8 (eight) hours as needed for moderate pain or severe pain. For cardiac rehab days only 60 tablet 0  . rosuvastatin (CRESTOR) 20 MG tablet Take 1 tablet (20 mg total) by mouth daily. 90 tablet 3   No current facility-administered medications for this visit.      Past Medical History:  Diagnosis Date  . Constipation   . Diabetes mellitus without complication ([place])    borderline/ pre diabetic  . NSTEMI (non-ST elevated myocardial infarction) ([place])    at [place]  . Stroke ([place])    L side - 40 years ago    Past Surgical History:  Procedure Laterality Date  . CARDIAC CATHETERIZATION    . CORONARY ARTERY BYPASS GRAFT     at [place] History  . Marital status: Married    Spouse name: N/A  . Number of children: 2  . Years of education: N/A   Occupational History  . Not on file.   Social History Main Topics  . Smoking status: Former Smoker    Quit date: 04/11/1968  . Smokeless tobacco: Never Used  . Alcohol use Yes     Comment: Rare  . Drug use: No    . Sexual activity: Not on file   Other Topics Concern  . Not on file   Social History Narrative  . No narrative on file    Family History  Problem Relation Age of Onset  . Family history unknown: Yes    ROS: Recently treated for pneumonia; no fevers or chills, productive cough, hemoptysis, dysphasia, odynophagia, melena, hematochezia, dysuria, hematuria, rash, seizure activity, orthopnea, PND, pedal edema, claudication. Remaining systems are negative.  Physical Exam: Well-developed well-nourished in no acute distress.  Skin is warm and dry.  HEENT is normal.  Neck is supple. No bruits Chest is clear to auscultation with normal expansion. No wheeze Cardiovascular exam is regular rate and rhythm. No murmur Abdominal exam nontender or distended. No masses palpated. Extremities show no edema. neuro grossly intact   A/P  1 coronary artery disease status post coronary artery bypass graft-continue aspirin and statin. Continue Plavix through November and then discontinue. Check echocardiogram for quantification of LV function.  2 hyperlipidemia-continue statin. LDL is not at target. Increase Crestor to 40 mg daily. Check lipids and liver in 6 weeks.  3 diabetes mellitus-management per primary care.  Kirk Ruths, MD

## 2016-08-19 ENCOUNTER — Telehealth: Payer: Self-pay | Admitting: Cardiology

## 2016-08-19 NOTE — Telephone Encounter (Signed)
New message    Request for surgical clearance:  1. What type of surgery is being performed? Ingrown toe nail   2. When is this surgery scheduled?  08/23/16  3. Are there any medications that need to be held prior to surgery and how long? Plavix for how long?  4. Name of physician performing surgery? Dr Francisco Capuchin   5. What is your office phone and fax number? [phone] fax [phone]

## 2016-08-19 NOTE — Telephone Encounter (Signed)
Pt of Dr. Stanford Breed  req for surgical clearance & Plavix hold - see operator's notes Seen in February, has f/u Mon 5/14.  CABG in November 2017 with a LIMA to the LAD, saphenous vein graft to the first marginal and saphenous vein graft to the distal right coronary artery.

## 2016-08-19 NOTE — Telephone Encounter (Signed)
Ok to hold plavix 7 days prior to procedure and resume day after [name] [name]

## 2016-08-19 NOTE — Addendum Note (Signed)
Encounter addended by: Jewel Baize, RD on: 08/19/2016 10:49 AM<BR>    Actions taken: Flowsheet data copied forward, Visit Navigator Flowsheet section accepted

## 2016-08-19 NOTE — Telephone Encounter (Signed)
Will fax this note to the number provided. 

## 2016-08-20 LAB — HEPATIC FUNCTION PANEL
ALT: 20 IU/L (ref 0–44)
AST: 22 IU/L (ref 0–40)
Albumin: 4.1 g/dL (ref 3.5–4.8)
Alkaline Phosphatase: 72 IU/L (ref 39–117)
Bilirubin Total: 0.3 mg/dL (ref 0.0–1.2)
Bilirubin, Direct: 0.11 mg/dL (ref 0.00–0.40)
Total Protein: 7.1 g/dL (ref 6.0–8.5)

## 2016-08-20 LAB — LIPID PANEL
Chol/HDL Ratio: 3.5 ratio (ref 0.0–5.0)
Cholesterol, Total: 184 mg/dL (ref 100–199)
HDL: 53 mg/dL (ref >39)
LDL CALC: 115 mg/dL — AB (ref 0–99)
TRIGLYCERIDES: 80 mg/dL (ref 0–149)
VLDL CHOLESTEROL CAL: 16 mg/dL (ref 5–40)

## 2016-08-22 ENCOUNTER — Other Ambulatory Visit: Payer: Self-pay | Admitting: *Deleted

## 2016-08-22 ENCOUNTER — Encounter: Payer: Self-pay | Admitting: Cardiology

## 2016-08-22 ENCOUNTER — Ambulatory Visit (INDEPENDENT_AMBULATORY_CARE_PROVIDER_SITE_OTHER): Payer: Medicare Other | Admitting: Cardiology

## 2016-08-22 VITALS — BP 104/60 | HR 72 | Ht 65.0 in | Wt 164.0 lb

## 2016-08-22 DIAGNOSIS — E7800 Pure hypercholesterolemia, unspecified: Secondary | ICD-10-CM

## 2016-08-22 DIAGNOSIS — I25810 Atherosclerosis of coronary artery bypass graft(s) without angina pectoris: Secondary | ICD-10-CM | POA: Diagnosis not present

## 2016-08-22 MED ORDER — ROSUVASTATIN CALCIUM 40 MG PO TABS: 40.0000 mg | ORAL_TABLET | Freq: Every day | ORAL | 3 refills | Status: DC

## 2016-08-22 NOTE — Patient Instructions (Signed)
Medication Instructions:   INCREASE ROSUVASTATIN TO 40 MG ONCE DAILY= 2 OF THE 20 MG TABLETS ONCE DAILY  Labwork:  Your physician recommends that you return for lab work in: 6 WEEKS= [facility]= DO NOT EAT PRIOR TO LAB WORK  Testing/Procedures:  Your physician has requested that you have an echocardiogram. Echocardiography is a painless test that uses sound waves to create images of your heart. It provides your doctor with information about the size and shape of your heart and how well your heart's chambers and valves are working. This procedure takes approximately one hour. There are no restrictions for this procedure.    Follow-Up:  Your physician wants you to follow-up in: [place] will receive a reminder letter in the mail two months in advance. If you don't receive a letter, please call our office to schedule the follow-up appointment.   If you need a refill on your cardiac medications before your next appointment, please call your pharmacy.

## 2016-10-03 ENCOUNTER — Ambulatory Visit (HOSPITAL_COMMUNITY): Payer: Medicare Other | Attending: Internal Medicine

## 2016-10-03 ENCOUNTER — Other Ambulatory Visit: Payer: Medicare Other | Admitting: *Deleted

## 2016-10-03 ENCOUNTER — Other Ambulatory Visit: Payer: Self-pay

## 2016-10-03 DIAGNOSIS — E7800 Pure hypercholesterolemia, unspecified: Secondary | ICD-10-CM

## 2016-10-03 DIAGNOSIS — I071 Rheumatic tricuspid insufficiency: Secondary | ICD-10-CM | POA: Insufficient documentation

## 2016-10-03 DIAGNOSIS — I25810 Atherosclerosis of coronary artery bypass graft(s) without angina pectoris: Secondary | ICD-10-CM | POA: Insufficient documentation

## 2016-10-03 LAB — HEPATIC FUNCTION PANEL
ALBUMIN: 4 g/dL (ref 3.5–4.8)
ALK PHOS: 70 IU/L (ref 39–117)
ALT: 25 IU/L (ref 0–44)
AST: 26 IU/L (ref 0–40)
BILIRUBIN, DIRECT: 0.08 mg/dL (ref 0.00–0.40)
Bilirubin Total: 0.3 mg/dL (ref 0.0–1.2)
TOTAL PROTEIN: 6.8 g/dL (ref 6.0–8.5)

## 2016-10-03 LAB — LIPID PANEL
Chol/HDL Ratio: 3.6 ratio (ref 0.0–5.0)
Cholesterol, Total: 177 mg/dL (ref 100–199)
HDL: 49 mg/dL (ref >39)
LDL Calculated: 109 mg/dL — ABNORMAL HIGH (ref 0–99)
Triglycerides: 93 mg/dL (ref 0–149)
VLDL Cholesterol Cal: 19 mg/dL (ref 5–40)

## 2016-10-05 ENCOUNTER — Telehealth: Payer: Self-pay | Admitting: *Deleted

## 2016-10-05 DIAGNOSIS — E7800 Pure hypercholesterolemia, unspecified: Secondary | ICD-10-CM

## 2016-10-05 NOTE — Telephone Encounter (Addendum)
-----   Message from Lelon Perla, MD sent at 10/03/2016  3:51 PM EDT ----- Add zetia 10 mg daily; lipids and liver 4 weeks  Kirk Ruths  Left message for pt to call for lab and echo results

## 2016-10-17 ENCOUNTER — Other Ambulatory Visit: Payer: Self-pay

## 2016-10-17 DIAGNOSIS — E7800 Pure hypercholesterolemia, unspecified: Secondary | ICD-10-CM

## 2016-10-17 MED ORDER — EZETIMIBE 10 MG PO TABS: 10.0000 mg | ORAL_TABLET | Freq: Every day | ORAL | 6 refills | Status: DC

## 2016-10-17 NOTE — Telephone Encounter (Signed)
New Message ° ° pt verbalized that he is returning call for rn  °

## 2016-10-17 NOTE — Telephone Encounter (Signed)
Returned call to patient.Dr.Crenshaw advised to start zetia 10 mg daily.Repeat fasting lipid and liver panels in 4 weeks, lab orders mailed.Echo results given.

## 2016-10-17 NOTE — Addendum Note (Signed)
Addended by: Kathyrn Lass on: 10/17/2016 05:32 PM   Modules accepted: Orders

## 2016-10-17 NOTE — Telephone Encounter (Signed)
Left message for pt to call.

## 2016-11-15 LAB — LIPID PANEL
CHOLESTEROL TOTAL: 144 mg/dL (ref 100–199)
Chol/HDL Ratio: 3.1 ratio (ref 0.0–5.0)
HDL: 46 mg/dL (ref >39)
LDL Calculated: 84 mg/dL (ref 0–99)
TRIGLYCERIDES: 70 mg/dL (ref 0–149)
VLDL CHOLESTEROL CAL: 14 mg/dL (ref 5–40)

## 2016-11-15 LAB — HEPATIC FUNCTION PANEL
ALK PHOS: 65 IU/L (ref 39–117)
ALT: 24 IU/L (ref 0–44)
AST: 26 IU/L (ref 0–40)
Albumin: 4.3 g/dL (ref 3.5–4.8)
Bilirubin Total: 0.4 mg/dL (ref 0.0–1.2)
Bilirubin, Direct: 0.12 mg/dL (ref 0.00–0.40)
Total Protein: 7 g/dL (ref 6.0–8.5)

## 2016-11-17 ENCOUNTER — Encounter: Payer: Self-pay | Admitting: *Deleted

## 2017-01-26 ENCOUNTER — Ambulatory Visit (INDEPENDENT_AMBULATORY_CARE_PROVIDER_SITE_OTHER): Payer: Medicare Other | Admitting: Physician Assistant

## 2017-01-26 ENCOUNTER — Encounter: Payer: Self-pay | Admitting: Physician Assistant

## 2017-01-26 VITALS — BP 106/66 | HR 71 | Ht 65.0 in | Wt 166.0 lb

## 2017-01-26 DIAGNOSIS — Z79899 Other long term (current) drug therapy: Secondary | ICD-10-CM | POA: Diagnosis not present

## 2017-01-26 DIAGNOSIS — E785 Hyperlipidemia, unspecified: Secondary | ICD-10-CM | POA: Diagnosis not present

## 2017-01-26 DIAGNOSIS — I2510 Atherosclerotic heart disease of native coronary artery without angina pectoris: Secondary | ICD-10-CM | POA: Diagnosis not present

## 2017-01-26 DIAGNOSIS — E119 Type 2 diabetes mellitus without complications: Secondary | ICD-10-CM | POA: Diagnosis not present

## 2017-01-26 NOTE — Progress Notes (Signed)
Cardiology Office Note    Date:  01/27/2017   ID:  Christopher, Glasscock 09/17/1945, MRN [id number]  PCP:  [name], MD  Cardiologist:  Dr. Stanford Breed   Chief Complaint  Patient presents with  . Follow-up    CAD/Seen for Dr. Stanford Breed    History of Present Illness:  [name] [name] is a 71 y.o. male with PMH of HLD, DM II and CAD s/p LIMA to LAD, SVG to OM1, and SVG to distal RCA. He had a NSTEMI in [place] and ultimately underwent bypass surgery. Abdominal ultrasound in March 2018 showed no aneurysm. He was last seen by Dr. Stanford Breed on 08/22/2016, his Crestor was increased to 40 mg daily. Last lipid panel obtained on 11/15/2016 showed total cholesterol 144, HDL 46, LDL 84, corticosteroid 70. Zetia 10 mg was added. Echocardiogram obtained on 10/03/2016 showed EF 60-65%, grade 1 DD, mild MR   He presents today along with his wife. He denies any exertional chest discomfort or shortness of breath. He does have a soreness in the chest that has been persistent since the previous bypass surgery, however the degree and the frequency of the soreness in the chest has not changed in the past year. I would not recommend any ischemic workup at this time. Otherwise he denies any lower extremity edema, orthopnea or PND. Given that additional Zetia, I want to obtain a fasting lipid panel and LFT. However he says he wants to go back to the previous exercise regimen, I will hold off fasting lipid panel and LFT for 6 month to give him some time to improve activity level.   Past Medical History:  Diagnosis Date  . Constipation   . Diabetes mellitus without complication ([place])    borderline/ pre diabetic  . NSTEMI (non-ST elevated myocardial infarction) ([place])    at [place]  . Stroke ([place])    L side - 40 years ago    Past Surgical History:  Procedure Laterality Date  . CARDIAC CATHETERIZATION    . CORONARY ARTERY BYPASS GRAFT     at [facility]    Current Medications: Outpatient Medications  Prior to Visit  Medication Sig Dispense Refill  . acetaminophen (TYLENOL) 650 MG CR tablet Take 650 mg by mouth QID.    [name] aspirin EC 81 MG tablet Take 81 mg by mouth daily.    [name] ezetimibe (ZETIA) 10 MG tablet Take 1 tablet (10 mg total) by mouth daily. 30 tablet 6  . senna (SENOKOT) 8.6 MG tablet Take 1 tablet by mouth 2 (two) times daily as needed for constipation.    . traMADol (ULTRAM) 50 MG tablet Take 1 tablet (50 mg total) by mouth every 8 (eight) hours as needed for moderate pain or severe pain. For cardiac rehab days only 60 tablet 0  . rosuvastatin (CRESTOR) 40 MG tablet Take 1 tablet (40 mg total) by mouth daily. 90 tablet 3   No facility-administered medications prior to visit.      Allergies:   Patient has no known allergies.   Social History   Social History  . Marital status: Married    Spouse name: N/A  . Number of children: 2  . Years of education: N/A   Social History Main Topics  . Smoking status: Former Smoker    Quit date: 04/11/1968  . Smokeless tobacco: Never Used     Comment: quit in the 1970s  . Alcohol use Yes     Comment: Rare  . Drug use: No  .  Sexual activity: Not Asked   Other Topics Concern  . None   Social History Narrative  . None     Family History:  The patient's family history includes Liver disease in his father.   ROS:   Please see the history of present illness.    ROS All other systems reviewed and are negative.   PHYSICAL EXAM:   VS:  BP 106/66   Pulse 71   Ht 5\' 5"  (1.651 m)   Wt 166 lb (75.3 kg)   BMI 27.62 kg/m    GEN: Well nourished, well developed, in no acute distress  HEENT: normal  Neck: no JVD, carotid bruits, or masses Cardiac: RRR; no murmurs, rubs, or gallops,no edema  Respiratory:  clear to auscultation bilaterally, normal work of breathing GI: soft, nontender, nondistended, + BS MS: no deformity or atrophy  Skin: warm and dry, no rash Neuro:  Alert and Oriented x 3, Strength and sensation are  intact Psych: euthymic mood, full affect  Wt Readings from Last 3 Encounters:  01/26/17 166 lb (75.3 kg)  08/22/16 164 lb (74.4 kg)  08/10/16 162 lb 4.1 oz (73.6 kg)      Studies/Labs Reviewed:   EKG:  EKG is ordered today.  The ekg ordered today demonstrates Normal sinus rhythm, no significant ST-T wave changes  Recent Labs: 11/15/2016: ALT 24   Lipid Panel    Component Value Date/Time   CHOL 144 11/15/2016 0929   TRIG 70 11/15/2016 0929   HDL 46 11/15/2016 0929   CHOLHDL 3.1 11/15/2016 0929   LDLCALC 84 11/15/2016 0929    Additional studies/ records that were reviewed today include:   Echo 10/03/2016 LV EF: 50% -   55%  ------------------------------------------------------------------- Indications:      CAD (I25.81).  ------------------------------------------------------------------- History:   PMH:   Stroke.  PMH:   Myocardial infarction.  Risk factors:  Diabetes mellitus.  ------------------------------------------------------------------- Study Conclusions  - Left ventricle: LVEF is approximately 55% with very small region   of apical hypokinesis. Systolic function was normal. The   estimated ejection fraction was in the range of 50% to 55%.   Doppler parameters are consistent with abnormal left ventricular   relaxation (grade 1 diastolic dysfunction). - Mitral valve: There was mild regurgitation.   ASSESSMENT:    1. Coronary artery disease involving native coronary artery of native heart without angina pectoris   2. Hyperlipidemia LDL goal <70   3. Encounter for long-term (current) use of medications      PLAN:  In order of problems listed above:  1. CAD s/p CABG: Denies any exertional chest discomfort. He has a history of soreness in the chest after bypass surgery, this has not changed for the past year. It does not occur with exertion.  2. Hyperlipidemia: Recently started on Zetia  3. DM 2: Managed by primary care provider.    Medication  Adjustments/Labs and Tests Ordered: Current medicines are reviewed at length with the patient today.  Concerns regarding medicines are outlined above.  Medication changes, Labs and Tests ordered today are listed in the Patient Instructions below. Patient Instructions  General Instructions:  Increase activity/exercise as tolerated.  Medication Instructions:   No changes to medications.  Labwork:   Your physician recommends that you return for lab work in: 6-8 weeks. You may have this drawn in our office, and you do not need an appointment. Our lab is open from 8:00 to 4:30 M-F (closed for lunch from 12:45 to 1:45).  You  will need to fast for this test. (Nothing to eat or drink except water or black coffee for 6-8 hours prior to test)   Testing/Procedures:  none  Follow-Up:  6 months with Dr. Stanford Breed. We will send you a reminder letter in the mail when it is time to schedule this appointment.  If you need a refill on your cardiac medications before your next appointment, please call your pharmacy.      Hilbert Corrigan, [place]  01/27/2017 8:53 PM    [facility] [place], [place], [place]  [address] Phone: [phone]; Fax: [phone]

## 2017-01-26 NOTE — Patient Instructions (Signed)
General Instructions:  Increase activity/exercise as tolerated.  Medication Instructions:   No changes to medications.  Labwork:   Your physician recommends that you return for lab work in: 6-8 weeks. You may have this drawn in our office, and you do not need an appointment. Our lab is open from 8:00 to 4:30 M-F (closed for lunch from 12:45 to 1:45).  You will need to fast for this test. (Nothing to eat or drink except water or black coffee for 6-8 hours prior to test)   Testing/Procedures:  none  Follow-Up:  6 months with Dr. Stanford Breed. We will send you a reminder letter in the mail when it is time to schedule this appointment.  If you need a refill on your cardiac medications before your next appointment, please call your pharmacy.

## 2017-01-27 ENCOUNTER — Encounter: Payer: Self-pay | Admitting: Physician Assistant

## 2017-02-01 ENCOUNTER — Telehealth: Payer: Self-pay | Admitting: *Deleted

## 2017-02-01 NOTE — Telephone Encounter (Signed)
-----   Message from [place], [place] sent at 01/27/2017  8:50 PM EDT ----- Regarding: Crestor Please check with Mr Austad regarding his Crestor, due to the elevated LDL despite 40mg  daily of Crestor, zetia was added on top of crestor, but crestor was removed from his medication list for unknown reason.   Hilbert Corrigan PA Pager: [phone]

## 2017-02-01 NOTE — Telephone Encounter (Signed)
Reviewed w patient, still taking crestor as prescribed.

## 2017-02-02 ENCOUNTER — Telehealth: Payer: Self-pay

## 2017-02-02 NOTE — Telephone Encounter (Signed)
Called pt to schedule Medicare Annual Wellness Visit. -nr  

## 2017-06-24 ENCOUNTER — Other Ambulatory Visit: Payer: Self-pay | Admitting: Cardiology

## 2017-06-26 ENCOUNTER — Other Ambulatory Visit: Payer: Self-pay | Admitting: *Deleted

## 2017-06-26 NOTE — Telephone Encounter (Signed)
REFILL 

## 2017-09-06 ENCOUNTER — Other Ambulatory Visit: Payer: Self-pay | Admitting: Cardiology

## 2017-09-06 NOTE — Telephone Encounter (Signed)
Rx sent to pharmacy   

## 2017-10-10 ENCOUNTER — Other Ambulatory Visit: Payer: Self-pay | Admitting: Cardiology

## 2017-10-25 ENCOUNTER — Telehealth: Payer: Self-pay | Admitting: Cardiology

## 2017-10-25 NOTE — Telephone Encounter (Signed)
Called and attempted to notify patient that the lab orders should be still active and able to use it has not been over a year yet.No answer and no voicemail to notify patient.

## 2017-10-25 NOTE — Telephone Encounter (Signed)
New Message      Patient's wife is calling to see if patient can come in for lab work. There are orders in but they are from 2018. Pls advise

## 2017-10-25 NOTE — Telephone Encounter (Signed)
Patient was made aware to come in and have labs drawn. No questions or concerns.

## 2018-02-18 ENCOUNTER — Other Ambulatory Visit: Payer: Self-pay | Admitting: Cardiology

## 2018-02-19 NOTE — Telephone Encounter (Signed)
Rx has been sent to the pharmacy electronically. ° °

## 2018-03-03 NOTE — Progress Notes (Signed)
Cardiology Office Note   Date:  03/05/2018   ID:  Neyland, Pettengill 01-04-1946, MRN [id number]  PCP:  [name], MD  Cardiologist: Dr. Stanford Breed Chief Complaint  Patient presents with  . Coronary Artery Disease    CABG  . Hyperlipidemia     History of Present Illness: Alfredo Spong is a 72 y.o. male who presents for ongoing assessment and management of coronary artery disease, with history of CABG (LIMA to LAD, SVG to OM1, and SVG to distal RCA).  This was completed in the setting of an end STEMI in [place].  Other history includes hyperlipidemia, type 2 diabetes.  He was last seen in the office by Almyra Deforest, PA on 01/26/2017, at which time he continued to complain of some mild soreness in his chest since having CABG over a year ago.    Lab work was recommended to have fasting lipid panel and LFTs, but the patient wanted to begin an exercise regimen first, and requested to wait on repeat labs.  He was continued on Crestor and Zetia.  He has stopped taking statin medication as he felt it was making him feel badly, drain his energy. He has been watching his diet and doing a lot of walking but no specific exercise regimen. He denies chest pain, fatigue or DOE.  Past Medical History:  Diagnosis Date  . Constipation   . Diabetes mellitus without complication ([place])    borderline/ pre diabetic  . NSTEMI (non-ST elevated myocardial infarction) ([place])    at [place]  . Stroke ([place])    L side - 40 years ago    Past Surgical History:  Procedure Laterality Date  . CARDIAC CATHETERIZATION    . CORONARY ARTERY BYPASS GRAFT     at [facility]     Current Outpatient Medications  Medication Sig Dispense Refill  . acetaminophen (TYLENOL) 650 MG CR tablet Take 650 mg by mouth QID.    [name] aspirin EC 81 MG tablet Take 81 mg by mouth as directed.     . clopidogrel (PLAVIX) 75 MG tablet Take 1 tablet (75 mg total) by mouth daily. Need appointment 30 tablet 0   No current  facility-administered medications for this visit.     Allergies:   Patient has no known allergies.    Social History:  The patient  reports that he quit smoking about 49 years ago. He has never used smokeless tobacco. He reports that he drinks alcohol. He reports that he does not use drugs.   Family History:  The patient's family history includes Liver disease in his father.    ROS: All other systems are reviewed and negative. Unless otherwise mentioned in H&P    PHYSICAL EXAM: VS:  BP 104/64   Pulse 72   Ht 5\' 5"  (1.651 m)   Wt 172 lb 6.4 oz (78.2 kg)   BMI 28.69 kg/m  , BMI Body mass index is 28.69 kg/m. GEN: Well nourished, well developed, in no acute distress HEENT: normal Neck: no JVD, carotid bruits, or masses Cardiac: RRR; no murmurs, rubs, or gallops,no edema Diminished DP pulse, but auscultated with doppler  Respiratory:  Clear to auscultation bilaterally, normal work of breathing GI: soft, nontender, nondistended, + BS MS: no deformity or atrophy Skin: warm and dry, no rash Neuro:  Strength and sensation are intact Psych: euthymic mood, full affect   EKG:  NSR with anterior T-wave abnormality. Compared to previous EKG T-wave inversion noted in V3 and T-wave flattening in  V4.   Recent Labs: No results found for requested labs within last 8760 hours.    Lipid Panel    Component Value Date/Time   CHOL 144 11/15/2016 0929   TRIG 70 11/15/2016 0929   HDL 46 11/15/2016 0929   CHOLHDL 3.1 11/15/2016 0929   LDLCALC 84 11/15/2016 0929      Wt Readings from Last 3 Encounters:  03/05/18 172 lb 6.4 oz (78.2 kg)  01/26/17 166 lb (75.3 kg)  08/22/16 164 lb (74.4 kg)      Other studies Reviewed: Echo 10/03/2016 Left ventricle: LVEF is approximately 55% with very small region   of apical hypokinesis. Systolic function was normal. The   estimated ejection fraction was in the range of 50% to 55%.   Doppler parameters are consistent with abnormal left  ventricular   relaxation (grade 1 diastolic dysfunction). - Mitral valve: There was mild regurgitation.  ASSESSMENT AND PLAN:  1. CAD: Hx of CABG unknown date. He is without symptoms of chest pain or fatigue. He is only on Plavix now.   2. Hypercholesterolemia: He has taken himself off of statins. I will check lipids and LFT's to evaluate his status. He is instructed that he will need to have an LDL of < 70 to protect against future events.   3.Hypertension: He is not on antihypertensives at this time and is hypotension.   4. Leg Pain: Not true intermittent claudication symptoms.Doppler pulse on the left. Palpated on the right. Will need follow up check on next visit. Consider ABI if he has persistent symptoms.    Current medicines are reviewed at length with the patient today.    Labs/ tests ordered today include: Fasting lipids and LFT's, BMET and CBC.  Phill Myron. [place] Pugh, ANP, AACC   03/05/2018 3:29 PM    [address] Office [phone] Fax [phone]

## 2018-03-05 ENCOUNTER — Encounter: Payer: Self-pay | Admitting: Adult Health

## 2018-03-05 ENCOUNTER — Ambulatory Visit: Payer: Medicare Other | Admitting: Adult Health

## 2018-03-05 VITALS — BP 104/64 | HR 72 | Ht 65.0 in | Wt 172.4 lb

## 2018-03-05 DIAGNOSIS — Z79899 Other long term (current) drug therapy: Secondary | ICD-10-CM | POA: Diagnosis not present

## 2018-03-05 DIAGNOSIS — I2510 Atherosclerotic heart disease of native coronary artery without angina pectoris: Secondary | ICD-10-CM

## 2018-03-05 DIAGNOSIS — E7800 Pure hypercholesterolemia, unspecified: Secondary | ICD-10-CM | POA: Diagnosis not present

## 2018-03-05 NOTE — Patient Instructions (Signed)
Medication Instructions:  NO CHANGES- Your physician recommends that you continue on your current medications as directed. Please refer to the Current Medication list given to you today.  If you need a refill on your cardiac medications before your next appointment, please call your pharmacy.  Labwork: LIPID, LFT, CBC AND BMET-FASTING HERE IN OUR OFFICE AT [facility]  Take the provided lab slips with you to the lab for your blood draw.   You will need to fast. DO NOT EAT OR DRINK PAST MIDNIGHT.   If you have labs (blood work) drawn today and your tests are completely normal, you will receive your results only by: [name] MyChart Message (if you have MyChart) OR . A paper copy in the mail If you have any lab test that is abnormal or we need to change your treatment, we will call you to review the results.  Follow-Up: You will need a follow up appointment in 6MONTHS.  Please call our office 2 months in [facility] 2020) to schedule the (MAY 2020) appointment.  You may see  DR Orma Flaming, DNP, AACC or one of the following Advanced Practice Providers on your designated Care Team:    . Kerin Ransom, PA-C  At [facility], you and your health needs are our priority.  As part of our continuing mission to provide you with exceptional heart care, we have created designated Provider Care Teams.  These Care Teams include your primary Cardiologist (physician) and Advanced Practice Providers (APPs -  Physician Assistants and Nurse Practitioners) who all work together to provide you with the care you need, when you need it.

## 2018-03-16 ENCOUNTER — Other Ambulatory Visit: Payer: Self-pay | Admitting: Cardiology

## 2018-09-21 ENCOUNTER — Telehealth: Payer: Self-pay

## 2018-09-21 NOTE — Telephone Encounter (Signed)

## 2018-09-25 ENCOUNTER — Telehealth: Payer: Self-pay | Admitting: Cardiology

## 2018-09-25 NOTE — Telephone Encounter (Signed)
smartphone/ my chart via emailed/ consent/ pre reg completed

## 2018-09-26 NOTE — Progress Notes (Signed)
Virtual Visit via Video Note changed to phone visit at patient request.   This visit type was conducted due to national recommendations for restrictions regarding the COVID-19 Pandemic (e.g. social distancing) in an effort to limit this patient's exposure and mitigate transmission in our community.  Due to his co-morbid illnesses, this patient is at least at moderate risk for complications without adequate follow up.  This format is felt to be most appropriate for this patient at this time.  All issues noted in this document were discussed and addressed.  A limited physical exam was performed with this format.  Please refer to the patient's chart for his consent to telehealth for [facility].   Date:  09/28/2018   ID:  [name] [name], [name] [name] 31-Oct-1945, MRN [id number]  Patient Location: Home Provider Location: Home  PCP:  [name], MD  Cardiologist:  Dr Stanford Breed  Evaluation Performed:  Follow-Up Visit  Chief Complaint:  FU CAD  History of Present Illness:    FU coronary artery disease. Patient was previously in [place] and had a non-ST elevation myocardial infarction. He ultimately underwent coronary artery bypass graft in November 2017 with a LIMA to the LAD, saphenous vein graft to the first marginal and saphenous vein graft to the distal right coronary artery. Abdominal ultrasound March 2018 showed no aneurysm.  Echocardiogram June 2018 showed ejection fraction 55% with apical hypokinesis.  Mild diastolic dysfunction and mild mitral regurgitation.  Since last seen, the patient has dyspnea with more extreme activities but not with routine activities. It is relieved with rest. It is not associated with chest pain. There is no orthopnea, PND or pedal edema. There is no syncope or palpitations. There is no exertional chest pain.    The patient does not have symptoms concerning for COVID-19 infection (fever, chills, cough, or new shortness of breath).    Past Medical  History:  Diagnosis Date  . Constipation   . Diabetes mellitus without complication ([place])    borderline/ pre diabetic  . NSTEMI (non-ST elevated myocardial infarction) ([place])    at [place]  . Stroke ([place])    L side - 40 years ago   Past Surgical History:  Procedure Laterality Date  . CARDIAC CATHETERIZATION    . CORONARY ARTERY BYPASS GRAFT     at [facility]     Current Meds  Medication Sig  . acetaminophen (TYLENOL) 650 MG CR tablet Take 650 mg by mouth QID.  [name] aspirin EC 81 MG tablet Take 81 mg by mouth as directed.   . clopidogrel (PLAVIX) 75 MG tablet Take 1 tablet (75 mg total) by mouth daily.     Allergies:   Patient has no known allergies.   Social History   Tobacco Use  . Smoking status: Former Smoker    Quit date: 04/11/1968    Years since quitting: 50.4  . Smokeless tobacco: Never Used  . Tobacco comment: quit in the 1970s  Substance Use Topics  . Alcohol use: Yes    Comment: Rare  . Drug use: No     Family Hx: The patient's family history includes Liver disease in his father.  ROS:   Please see the history of present illness.    No fevers, chills or productive cough. All other systems reviewed and are negative.   Recent Lipid Panel Lab Results  Component Value Date/Time   CHOL 144 11/15/2016 09:29 AM   TRIG 70 11/15/2016 09:29 AM   HDL 46 11/15/2016 09:29 AM  CHOLHDL 3.1 11/15/2016 09:29 AM   LDLCALC 84 11/15/2016 09:29 AM    Wt Readings from Last 3 Encounters:  09/28/18 165 lb (74.8 kg)  03/05/18 172 lb 6.4 oz (78.2 kg)  01/26/17 166 lb (75.3 kg)     Objective:    Vital Signs:  BP 113/69   Pulse 67   Ht 5\' 5"  (1.651 m)   Wt 165 lb (74.8 kg)   BMI 27.46 kg/m    VITAL SIGNS:  reviewed  No acute distress Answers questions appropriately Normal affect Remainder physical examination not performed (telehealth visit; coronavirus pandemic)  ASSESSMENT & PLAN:    1. Coronary artery disease status post coronary artery bypass  graft-patient doing well with no chest pain.  Plan to continue medical therapy with aspirin.  Discontinue Plavix.  Resume statin. 2. Hyperlipidemia-patient is not on a statin.  Long discussion concerning potential benefits including reduced myocardial infarction and death.  Patient somewhat hesitant.  We have provided a prescription for Crestor 40 mg daily.  He will review his records and if there were no side effects previously he will resume and we will check lipids and liver in 8 weeks. 3. Diabetes mellitus-managed by primary care.  COVID-19 Education: The importance of social distancing was discussed today.  Time:   Today, I have spent 18 minutes with the patient with telehealth technology discussing the above problems.     Medication Adjustments/Labs and Tests Ordered: Current medicines are reviewed at length with the patient today.  Concerns regarding medicines are outlined above.   Tests Ordered: No orders of the defined types were placed in this encounter.   Medication Changes: No orders of the defined types were placed in this encounter.   Follow Up:  Virtual Visit or In Person in 1 year(s)  Signed, Kirk Ruths, MD  09/28/2018 9:28 AM    [place]

## 2018-09-28 ENCOUNTER — Telehealth (INDEPENDENT_AMBULATORY_CARE_PROVIDER_SITE_OTHER): Payer: Medicare Other | Admitting: Cardiology

## 2018-09-28 ENCOUNTER — Encounter: Payer: Self-pay | Admitting: Cardiology

## 2018-09-28 VITALS — BP 113/69 | HR 67 | Ht 65.0 in | Wt 165.0 lb

## 2018-09-28 DIAGNOSIS — E7800 Pure hypercholesterolemia, unspecified: Secondary | ICD-10-CM | POA: Diagnosis not present

## 2018-09-28 DIAGNOSIS — I2510 Atherosclerotic heart disease of native coronary artery without angina pectoris: Secondary | ICD-10-CM | POA: Diagnosis not present

## 2018-09-28 MED ORDER — ROSUVASTATIN CALCIUM 40 MG PO TABS: 40.0000 mg | ORAL_TABLET | Freq: Every day | ORAL | 3 refills | Status: DC

## 2018-09-28 NOTE — Patient Instructions (Signed)
Medication Instructions:  STOP PLAVIX  START ROSUVASTATIN 40 MG ONCE DAILY If you need a refill on your cardiac medications before your next appointment, please call your pharmacy.   Lab work: Your physician recommends that you return for lab work in [place] If you have labs (blood work) drawn today and your tests are completely normal, you will receive your results only by: [name] MyChart Message (if you have MyChart) OR . A paper copy in the mail If you have any lab test that is abnormal or we need to change your treatment, we will call you to review the results.  Follow-Up: At [facility], you and your health needs are our priority.  As part of our continuing mission to provide you with exceptional heart care, we have created designated Provider Care Teams.  These Care Teams include your primary Cardiologist (physician) and Advanced Practice Providers (APPs -  Physician Assistants and Nurse Practitioners) who all work together to provide you with the care you need, when you need it. You will need a follow up appointment in 6 months.  Please call our office 2 months in advance to schedule this appointment.  You may see Kirk Ruths MD or one of the following Advanced Practice Providers on your designated Care Team:   Kerin Ransom, PA-C Roby Lofts, [place] . Sande Rives, PA-C

## 2018-10-25 ENCOUNTER — Encounter: Payer: Self-pay | Admitting: Neurology

## 2019-03-02 ENCOUNTER — Other Ambulatory Visit: Payer: Self-pay | Admitting: Cardiology

## 2019-03-05 ENCOUNTER — Ambulatory Visit: Payer: Medicare Other | Admitting: Podiatry

## 2019-03-05 ENCOUNTER — Telehealth: Payer: Self-pay | Admitting: *Deleted

## 2019-03-05 ENCOUNTER — Ambulatory Visit: Payer: Medicare Other

## 2019-03-05 ENCOUNTER — Other Ambulatory Visit: Payer: Self-pay

## 2019-03-05 DIAGNOSIS — L03032 Cellulitis of left toe: Secondary | ICD-10-CM | POA: Diagnosis not present

## 2019-03-05 DIAGNOSIS — R0989 Other specified symptoms and signs involving the circulatory and respiratory systems: Secondary | ICD-10-CM

## 2019-03-05 DIAGNOSIS — M7752 Other enthesopathy of left foot: Secondary | ICD-10-CM

## 2019-03-05 DIAGNOSIS — Q828 Other specified congenital malformations of skin: Secondary | ICD-10-CM | POA: Diagnosis not present

## 2019-03-05 DIAGNOSIS — M778 Other enthesopathies, not elsewhere classified: Secondary | ICD-10-CM

## 2019-03-05 NOTE — Telephone Encounter (Signed)
-----   Message from Rip [place], [facility] sent at 03/05/2019 10:15 AM EST ----- Regarding: Vascular Vascular - need ABI's  for decreased pulses in feet, paronychia 1st toe left, consult needed if abnormal

## 2019-03-05 NOTE — Telephone Encounter (Signed)
Faxed orders request to [facility].

## 2019-03-05 NOTE — Progress Notes (Signed)
Subjective:  Patient ID: [name] [name], male    DOB: 1946-03-14,  MRN: [id number] HPI Chief Complaint  Patient presents with  . Nail Problem    Pt states left 1st toenail lateral border painful 3-4 months duration, denies drainage, no known injury.  . Foot Pain    Pt states left sub 5th painful lesion 6 months duration, no known injury, very tender, painful on hard surfaces.   . Nail Problem    Bilateral 1-5 nail fungus    73 y.o. male presents with the above complaint.   ROS: Denies fever chills nausea vomiting muscle aches pains calf pain back pain chest pain shortness of breath.  States that he has to hang his left leg off of the bed when it starts to hurt at night in order for it to feel better.  But he denies any symptoms of claudication.  Past Medical History:  Diagnosis Date  . Constipation   . Diabetes mellitus without complication ([place])    borderline/ pre diabetic  . NSTEMI (non-ST elevated myocardial infarction) ([place])    at [place]  . Stroke ([place])    L side - 40 years ago   Past Surgical History:  Procedure Laterality Date  . CARDIAC CATHETERIZATION    . CORONARY ARTERY BYPASS GRAFT     at [facility]    Current Outpatient Medications:  .  acetaminophen (TYLENOL) 650 MG CR tablet, Take 650 mg by mouth QID., Disp: , Rfl:  .  aspirin EC 81 MG tablet, Take 81 mg by mouth as directed. , Disp: , Rfl:  .  clopidogrel (PLAVIX) 75 MG tablet, Take 75 mg by mouth daily., Disp: , Rfl:  .  rosuvastatin (CRESTOR) 40 MG tablet, Take 1 tablet (40 mg total) by mouth daily., Disp: 90 tablet, Rfl: 3  No Known Allergies Review of Systems Objective:  There were no vitals filed for this visit.  General: Well developed, nourished, in no acute distress, alert and oriented x3   Dermatological: Skin is warm, dry and supple bilateral. Nails x 10 are well maintained; remaining integument appears unremarkable at this time. There are no open sores, no preulcerative lesions, no rash or  signs of infection present.  Thick painful hallux nail left.  Vascular: Dorsalis Pedis artery and Posterior Tibial artery pedal pulses are 2/4 right only.  0/4 left foot.  With immedate capillary fill time. Pedal hair growth present. No varicosities and no lower extremity edema present bilateral.  Left foot is cool to the touch  Neruologic: Grossly intact via light touch bilateral. Vibratory intact via tuning fork bilateral. Protective threshold with Semmes Wienstein monofilament intact to all pedal sites bilateral. Patellar and Achilles deep tendon reflexes 2+ bilateral. No Babinski or clonus noted bilateral.   Musculoskeletal: No gross boney pedal deformities bilateral. No pain, crepitus, or limitation noted with foot and ankle range of motion bilateral. Muscular strength 5/5 in all groups tested bilateral.  He has a mild tailor's bunion deformity with a porokeratotic lesion subfifth.  There is also an area of bursitis in here as well.  Gait: Unassisted, Nonantalgic.    Radiographs:  None taken  Assessment & Plan:   Assessment: Porokeratosis of bursitis left foot.  Peripheral vascular disease left foot.  Painful hallux nail left.  Plan: Injected a small amount of dexamethasone 2 mg sublesion only subfifth metatarsal head left foot.  Was able to easily denucleated lesion after the injection.  Also performed local anesthesia injection to the hallux right just in order  to debride the toenail.  We did not remove this toenail today due to the pending vascular evaluation.  We are sending him for vascular evaluation concerning the pulselessness of the left leg.      T. [place], [place]

## 2019-03-12 ENCOUNTER — Telehealth: Payer: Self-pay | Admitting: Podiatry

## 2019-03-12 NOTE — Telephone Encounter (Signed)
I informed pt I saw that he had been scheduled with [facility] and that was the referral from Dr. [name].

## 2019-03-12 NOTE — Telephone Encounter (Signed)
Pt called to get an update on an appt that Dr [name] wanted pt to see a vascular specialist and wanted to know if there was any appts yet

## 2019-03-19 ENCOUNTER — Other Ambulatory Visit: Payer: Self-pay

## 2019-03-19 ENCOUNTER — Ambulatory Visit (HOSPITAL_COMMUNITY)
Admission: RE | Admit: 2019-03-19 | Discharge: 2019-03-19 | Disposition: A | Discharge status: Home / Self Care | Payer: Medicare Other | Source: Ambulatory Visit | Attending: Cardiovascular Disease | Admitting: Cardiovascular Disease

## 2019-03-19 DIAGNOSIS — R0989 Other specified symptoms and signs involving the circulatory and respiratory systems: Secondary | ICD-10-CM | POA: Diagnosis present

## 2019-03-20 ENCOUNTER — Telehealth: Payer: Self-pay | Admitting: *Deleted

## 2019-03-20 NOTE — Telephone Encounter (Signed)
[facility] states Dr. Stanford Breed does not have earlier availability, but his assistant Kerin Ransom can evaluate and inform the doctor. LRosalyn Gess has availability 04/15/2019 at 10:30am and I accepted the appt and requested pt to be put on there Cancellation/Next Available schedule.

## 2019-03-20 NOTE — Telephone Encounter (Signed)
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

## 2019-03-20 NOTE — Telephone Encounter (Signed)
Left message with pt's wife, [name] for pt to call for information.

## 2019-03-20 NOTE — Telephone Encounter (Addendum)
Left message with pt's wife, [name] for pt to call me after 1:00pm and she gave me [phone] to call pt. Left message on [phone] for pt to call. Left message on [phone] to call for information from Dr. [name].

## 2019-03-21 NOTE — Telephone Encounter (Signed)
Left message on pt's alternate phone number to call for results. Mailed a letter with Dr. Stephenie Acres review of results and new appt with [facility].

## 2019-03-21 NOTE — Telephone Encounter (Signed)
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

## 2019-03-21 NOTE — Telephone Encounter (Signed)
Pt called for results left no contact phone number.

## 2019-03-21 NOTE — Telephone Encounter (Signed)
Left message on pt's alternate [phone] that I had mailed a letter with explanation of Dr. Stephenie Acres review of the circulation test and that if he had questions to contact me.

## 2019-03-21 NOTE — Telephone Encounter (Signed)
Left message on pt's home phone to call for results, unable to leave a message on pt's mobile voicemail box had not been set up.

## 2019-03-22 NOTE — Telephone Encounter (Signed)
Pt's wife, [name] called and states she has pt on the phone. I informed pt on 04/14/2018 10:30am appt, and I had sent a letter with explanation of Dr. Stephenie Acres review of circulation results.

## 2019-04-15 ENCOUNTER — Encounter: Payer: Self-pay | Admitting: Cardiology

## 2019-04-15 ENCOUNTER — Other Ambulatory Visit: Payer: Self-pay

## 2019-04-15 ENCOUNTER — Ambulatory Visit (INDEPENDENT_AMBULATORY_CARE_PROVIDER_SITE_OTHER): Payer: Medicare PPO | Admitting: Cardiology

## 2019-04-15 ENCOUNTER — Encounter

## 2019-04-15 VITALS — BP 122/70 | HR 73 | Temp 96.3°F | Ht 65.0 in | Wt 174.0 lb

## 2019-04-15 DIAGNOSIS — I739 Peripheral vascular disease, unspecified: Secondary | ICD-10-CM

## 2019-04-15 DIAGNOSIS — Z8673 Personal history of transient ischemic attack (TIA), and cerebral infarction without residual deficits: Secondary | ICD-10-CM | POA: Insufficient documentation

## 2019-04-15 DIAGNOSIS — Z951 Presence of aortocoronary bypass graft: Secondary | ICD-10-CM | POA: Diagnosis not present

## 2019-04-15 DIAGNOSIS — I6930 Unspecified sequelae of cerebral infarction: Secondary | ICD-10-CM | POA: Insufficient documentation

## 2019-04-15 DIAGNOSIS — I2510 Atherosclerotic heart disease of native coronary artery without angina pectoris: Secondary | ICD-10-CM | POA: Diagnosis not present

## 2019-04-15 DIAGNOSIS — E785 Hyperlipidemia, unspecified: Secondary | ICD-10-CM | POA: Diagnosis not present

## 2019-04-15 NOTE — Assessment & Plan Note (Signed)
CABG x3 LIMA to LAD, SVG to OM1, SVG to RCA in 02/2016 while he was in [place] visiting. Echo June 2018 showed an EF of 50-55% with grade 1 DD and mild MR

## 2019-04-15 NOTE — Assessment & Plan Note (Signed)
The patient decreased his Crestor 40 mg to once a week to prevent side effects

## 2019-04-15 NOTE — Assessment & Plan Note (Signed)
Abnormal LEA dopplers- it's not clear to me that he is symptomatic

## 2019-04-15 NOTE — Progress Notes (Signed)
Cardiology Office Note:    Date:  04/15/2019   ID:  [name] [name], DOB 07-23-1945, MRN [id number]  PCP:  [name] Moron, MD  Cardiologist:  No primary care provider on file.  Electrophysiologist:  None   Referring MD: [name] Moron, MD   CC- sent by his Podiatrist Dr [name] [name] for abnormal LEA dopplers.  History of Present Illness:    [name] [name] is a pleasant 74 y.o. male who works for [employer] for 20 years and then retired.  He then went ahead and got his masters degree from a ENT in [place].  He has a hx of CAD.  He had CABG times 13 February 2016 while he was visiting in [place].  He is done well from that standpoint since.  Echocardiogram in June 2018 showed an ejection fraction of 50 to 55% with mild MR and grade 1 diastolic dysfunction.  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  He has mild left-sided weakness from this.  He was evaluated by his podiatrist in November 2020.  Dr. Milinda [name] was concerned about the circulation to his left lower extremity.  LE a Dopplers were obtained 03/19/2019 that showed moderate left lower extremity arterial disease.  Vascular consult was recommended and the patient was added to my schedule for further evaluation.  The patient does not give me a clear history of claudication.  He says sometimes his left ankle bothers him when he walks.  He says he walks "a couple miles a day" but he is not actually consistently walking for exercise, this is just when he is being active.  He is not had prior issues with left lower extremity wound healing.  Past Medical History:  Diagnosis Date  . Constipation   . Diabetes mellitus without complication ([facility])    borderline/ pre diabetic  . NSTEMI (non-ST elevated myocardial infarction) ([place])    at [place]  . Stroke ([place])    L side - 40 years ago    Past Surgical History:  Procedure Laterality Date  . CARDIAC  CATHETERIZATION    . CORONARY ARTERY BYPASS GRAFT     at [facility]    Current Medications: Current Meds  Medication Sig  . acetaminophen (TYLENOL) 650 MG CR tablet Take 650 mg by mouth QID.  [name] aspirin EC 81 MG tablet Take 81 mg by mouth as directed.   . clopidogrel (PLAVIX) 75 MG tablet Take 75 mg by mouth daily.  . rosuvastatin (CRESTOR) 40 MG tablet Take 1 tablet (40 mg total) by mouth daily.     Allergies:   Patient has no known allergies.   Social History   Socioeconomic History  . Marital status: Married    Spouse name: Not on file  . Number of children: 2  . Years of education: Not on file  . Highest education level: Not on file  Occupational History  . Not on file  Tobacco Use  . Smoking status: Former Smoker    Quit date: 04/11/1968    Years since quitting: 51.0  . Smokeless tobacco: Never Used  . Tobacco comment: quit in the 1970s  Substance and Sexual Activity  . Alcohol use: Yes    Comment: Rare  . Drug use: No  . Sexual activity: Not on file  Other Topics Concern  . Not on file  Social History Narrative  . Not on file   Social Determinants of Health   Financial  Resource Strain:   . Difficulty of Paying Living Expenses: Not on file  Food Insecurity:   . Worried About Charity fundraiser in the Last Year: Not on file  . Ran Out of Food in the Last Year: Not on file  Transportation Needs:   . Lack of Transportation (Medical): Not on file  . Lack of Transportation (Non-Medical): Not on file  Physical Activity:   . Days of Exercise per Week: Not on file  . Minutes of Exercise per Session: Not on file  Stress:   . Feeling of Stress : Not on file  Social Connections:   . Frequency of Communication with Friends and Family: Not on file  . Frequency of Social Gatherings with Friends and Family: Not on file  . Attends Religious Services: Not on file  . Active Member of Clubs or Organizations: Not on file  . Attends Archivist Meetings: Not on file   . Marital Status: Not on file     Family History: The patient's family history includes Liver disease in his father.  ROS:   Please see the history of present illness. He denies any chest pain. He only takes his Crestor 40 mg once a week. All other systems reviewed and are negative.  EKGs/Labs/Other Studies Reviewed:    The following studies were reviewed today: Echo June 2018 LEA dopplers 03/19/2019  EKG:  EKG is ordered today.  The ekg ordered today demonstrates NSR, HR 73, septal Qs, and TWI V2  Recent Labs: No results found for requested labs within last 8760 hours.  Recent Lipid Panel    Component Value Date/Time   CHOL 144 11/15/2016 0929   TRIG 70 11/15/2016 0929   HDL 46 11/15/2016 0929   CHOLHDL 3.1 11/15/2016 0929   LDLCALC 84 11/15/2016 0929    Physical Exam:    VS:  BP 122/70 (BP Location: Left Arm, Patient Position: Sitting, Cuff Size: Normal)   Pulse 73   Temp (!) 96.3 F (35.7 C)   Ht 5\' 5"  (1.651 m)   Wt 174 lb (78.9 kg)   BMI 28.96 kg/m     Wt Readings from Last 3 Encounters:  04/15/19 174 lb (78.9 kg)  09/28/18 165 lb (74.8 kg)  03/05/18 172 lb 6.4 oz (78.2 kg)     GEN:  Well nourished, well developed in no acute distress HEENT: Normal NECK: No JVD; No carotid bruits CARDIAC: RRR, no murmurs, rubs, gallops RESPIRATORY:  Clear to auscultation without rales, wheezing or rhonchi  ABDOMEN: Soft, non-tender, non-distended MUSCULOSKELETAL:  No edema; No deformity, he has 2+ Rt DP pulse, I could not palpate LLE pulses.  He has a RFA bruit.  Both LE equal temperature, no difference in color.  He has a healing Lt anterior shin abrasion.  SKIN: Warm and dry NEUROLOGIC:  Alert and oriented x 3 PSYCHIATRIC:  Normal affect   ASSESSMENT:    Hx of CABG CABG x3 LIMA to LAD, SVG to OM1, SVG to RCA in 02/2016 while he was in [place] visiting. Echo June 2018 showed an EF of 50-55% with grade 1 DD and mild MR  PVD (peripheral vascular disease)  (HCC) Abnormal LEA dopplers- it's not clear to me that he is symptomatic  Dyslipidemia, goal LDL below 70 The patient decreased his Crestor 40 mg to once a week to prevent side effects  History of stroke Rt brain CVA in the 80's- mild residual Lt sided weakness per the patient  PLAN:  I'll forward the dopplers to Dr Gwenlyn Found and arrange for [facility] evaluation.    Medication Adjustments/Labs and Tests Ordered: Current medicines are reviewed at length with the patient today.  Concerns regarding medicines are outlined above.  Orders Placed This Encounter  Procedures  . EKG 12-Lead   No orders of the defined types were placed in this encounter.   Patient Instructions  Medication Instructions:  Your physician recommends that you continue on your current medications as directed. Please refer to the Current Medication list given to you today. *If you need a refill on your cardiac medications before your next appointment, please call your pharmacy*  Lab Work: None  If you have labs (blood work) drawn today and your tests are completely normal, you will receive your results only by: [name] MyChart Message (if you have MyChart) OR . A paper copy in the mail If you have any lab test that is abnormal or we need to change your treatment, we will call you to review the results.  Testing/Procedures: None   Follow-Up: At [facility], you and your health needs are our priority.  As part of our continuing mission to provide you with exceptional heart care, we have created designated Provider Care Teams.  These Care Teams include your primary Cardiologist (physician) and Advanced Practice Providers (APPs -  Physician Assistants and Nurse Practitioners) who all work together to provide you with the care you need, when you need it.  Your next appointment:   2-3 weeks   The format for your next appointment:   In Person  Provider:   Quay Burow, MD  Other Instructions     Signed, Kerin Ransom, PA-C  04/15/2019 11:17 AM

## 2019-04-15 NOTE — Assessment & Plan Note (Signed)
Rt brain CVA in the 23's- mild residual Lt sided weakness per the patient

## 2019-04-15 NOTE — Patient Instructions (Signed)
Medication Instructions:  Your physician recommends that you continue on your current medications as directed. Please refer to the Current Medication list given to you today. *If you need a refill on your cardiac medications before your next appointment, please call your pharmacy*  Lab Work: None  If you have labs (blood work) drawn today and your tests are completely normal, you will receive your results only by: [name] MyChart Message (if you have MyChart) OR . A paper copy in the mail If you have any lab test that is abnormal or we need to change your treatment, we will call you to review the results.  Testing/Procedures: None   Follow-Up: At [facility], you and your health needs are our priority.  As part of our continuing mission to provide you with exceptional heart care, we have created designated Provider Care Teams.  These Care Teams include your primary Cardiologist (physician) and Advanced Practice Providers (APPs -  Physician Assistants and Nurse Practitioners) who all work together to provide you with the care you need, when you need it.  Your next appointment:   2-3 weeks   The format for your next appointment:   In Person  Provider:   Quay Burow, MD  Other Instructions

## 2019-05-07 ENCOUNTER — Other Ambulatory Visit: Payer: Self-pay

## 2019-05-07 ENCOUNTER — Ambulatory Visit: Payer: Medicare Other | Admitting: Cardiology

## 2019-05-07 ENCOUNTER — Ambulatory Visit (INDEPENDENT_AMBULATORY_CARE_PROVIDER_SITE_OTHER): Payer: Medicare PPO | Admitting: Cardiovascular Disease

## 2019-05-07 ENCOUNTER — Encounter: Payer: Self-pay | Admitting: Cardiovascular Disease

## 2019-05-07 DIAGNOSIS — I739 Peripheral vascular disease, unspecified: Secondary | ICD-10-CM

## 2019-05-07 NOTE — Patient Instructions (Signed)
Medication Instructions:  Your physician recommends that you continue on your current medications as directed. Please refer to the Current Medication list given to you today.  If you need a refill on your cardiac medications before your next appointment, please call your pharmacy.   Lab work: NONE  Testing/Procedures: NONE  Follow-Up: At [facility], you and your health needs are our priority.  As part of our continuing mission to provide you with exceptional heart care, we have created designated Provider Care Teams.  These Care Teams include your primary Cardiologist (physician) and Advanced Practice Providers (APPs -  Physician Assistants and Nurse Practitioners) who all work together to provide you with the care you need, when you need it. You may see Dr. Gwenlyn Found or one of the following Advanced Practice Providers on your designated Care Team:    Kerin Ransom, PA-C  [place], [place]  Coletta Memos, [place]  Your physician wants you to follow-up in: 6 months with Dr. Gwenlyn Found

## 2019-05-07 NOTE — Assessment & Plan Note (Signed)
[name] was referred to me by Kerin Ransom, PA-C for evaluation of PAD.  He is a cardiology patient of Dr. Jacalyn Lefevre.  He has a history of CAD status post CABG several years ago.  He saw a podiatrist, Dr. [name] who could not feel a pedal pulse on the left.  Lower extremity Dopplers were performed 03/19/2019 revealing a normal right ABI and a left ABI of 0.57 with a high-frequency signal in the left popliteal artery with occluded anterior tibial.  He really denies claudication.  There are no open wound suggesting critical limb ischemia.  At this point, I recommend conservative care.  Certainly if he develops symptoms in the future he would be a suitable for endovascular therapy.  I will see him back in 6 months.

## 2019-05-07 NOTE — Progress Notes (Signed)
05/07/2019 [name] [name]   07-09-1945  [id number]  Primary Physician [name], MD Primary Cardiologist: Lorretta Harp MD [name], [place]  HPI:  [name] [name] is a 74 y.o.   mildly overweight married African-American male father of 2 sons, grandfather 2 grandchildren who is a retired Surveyor, quantity at [employer].  He was referred by Kerin Ransom, PA-C for peripheral vascular evaluation.  He does have a history of treated hyperlipidemia.  He had a stroke in the 44s.  He had bypass grafting x4 at [facility] 02/13/2016 and has remained stable since.  He did see Dr. [name], his podiatrist, because of pain in his left foot and lower extremity arterial Doppler studies ordered in our office 03/19/2019 revealed a normal right ABI with a left ABI of 0.57 and a high-grade lesion in his popliteal artery with an occluded anterior tibial.  He really denies claudication.  There is no open wound suggesting critical limb ischemia.   Current Meds  Medication Sig  . acetaminophen (TYLENOL) 650 MG CR tablet Take 650 mg by mouth QID.  [name] aspirin EC 81 MG tablet Take 81 mg by mouth as directed.   . clopidogrel (PLAVIX) 75 MG tablet Take 75 mg by mouth daily.  . rosuvastatin (CRESTOR) 40 MG tablet Take 1 tablet (40 mg total) by mouth daily.     No Known Allergies  Social History   Socioeconomic History  . Marital status: Married    Spouse name: Not on file  . Number of children: 2  . Years of education: Not on file  . Highest education level: Not on file  Occupational History  . Not on file  Tobacco Use  . Smoking status: Former Smoker    Quit date: 04/11/1968    Years since quitting: 51.1  . Smokeless tobacco: Never Used  . Tobacco comment: quit in the 1970s  Substance and Sexual Activity  . Alcohol use: Yes    Comment: Rare  . Drug use: No  . Sexual activity: Not on file  Other Topics Concern  . Not on file  Social History Narrative  . Not on file    Social Determinants of Health   Financial Resource Strain:   . Difficulty of Paying Living Expenses: Not on file  Food Insecurity:   . Worried About Charity fundraiser in the Last Year: Not on file  . Ran Out of Food in the Last Year: Not on file  Transportation Needs:   . Lack of Transportation (Medical): Not on file  . Lack of Transportation (Non-Medical): Not on file  Physical Activity:   . Days of Exercise per Week: Not on file  . Minutes of Exercise per Session: Not on file  Stress:   . Feeling of Stress : Not on file  Social Connections:   . Frequency of Communication with Friends and Family: Not on file  . Frequency of Social Gatherings with Friends and Family: Not on file  . Attends Religious Services: Not on file  . Active Member of Clubs or Organizations: Not on file  . Attends Archivist Meetings: Not on file  . Marital Status: Not on file  Intimate Partner Violence:   . Fear of Current or Ex-Partner: Not on file  . Emotionally Abused: Not on file  . Physically Abused: Not on file  . Sexually Abused: Not on file     Review of Systems: General: negative for chills,  fever, night sweats or weight changes.  Cardiovascular: negative for chest pain, dyspnea on exertion, edema, orthopnea, palpitations, paroxysmal nocturnal dyspnea or shortness of breath Dermatological: negative for rash Respiratory: negative for cough or wheezing Urologic: negative for hematuria Abdominal: negative for nausea, vomiting, diarrhea, bright red blood per rectum, melena, or hematemesis Neurologic: negative for visual changes, syncope, or dizziness All other systems reviewed and are otherwise negative except as noted above.    Blood pressure 123/82, pulse 62, temperature (!) 96.2 F (35.7 C), height 5\' 5"  (1.651 m), weight 181 lb (82.1 kg), SpO2 98 %.  General appearance: alert and no distress Neck: no adenopathy, no carotid bruit, no JVD, supple, symmetrical, trachea midline  and thyroid not enlarged, symmetric, no tenderness/mass/nodules Lungs: clear to auscultation bilaterally Heart: regular rate and rhythm, S1, S2 normal, no murmur, click, rub or gallop Extremities: extremities normal, atraumatic, no cyanosis or edema Pulses: Absent left pedal pulse Skin: Skin color, texture, turgor normal. No rashes or lesions Neurologic: Alert and oriented X 3, normal strength and tone. Normal symmetric reflexes. Normal coordination and gait  EKG not performed today  ASSESSMENT AND PLAN:   PVD (peripheral vascular disease) [facility]) Mr. Vonholten was referred to me by Kerin Ransom, PA-C for evaluation of PAD.  He is a cardiology patient of Dr. Jacalyn Lefevre.  He has a history of CAD status post CABG several years ago.  He saw a podiatrist, Dr. [name] who could not feel a pedal pulse on the left.  Lower extremity Dopplers were performed 03/19/2019 revealing a normal right ABI and a left ABI of 0.57 with a high-frequency signal in the left popliteal artery with occluded anterior tibial.  He really denies claudication.  There are no open wound suggesting critical limb ischemia.  At this point, I recommend conservative care.  Certainly if he develops symptoms in the future he would be a suitable for endovascular therapy.  I will see him back in 6 months.      Lorretta Harp MD FACP,FACC,FAHA, [facility] 05/07/2019 11:21 AM

## 2019-07-23 ENCOUNTER — Ambulatory Visit: Payer: Medicare PPO | Admitting: Podiatry

## 2019-07-23 ENCOUNTER — Other Ambulatory Visit: Payer: Self-pay

## 2019-07-23 DIAGNOSIS — M79676 Pain in unspecified toe(s): Secondary | ICD-10-CM | POA: Diagnosis not present

## 2019-07-23 DIAGNOSIS — Q828 Other specified congenital malformations of skin: Secondary | ICD-10-CM | POA: Diagnosis not present

## 2019-07-23 DIAGNOSIS — B351 Tinea unguium: Secondary | ICD-10-CM | POA: Diagnosis not present

## 2019-07-23 DIAGNOSIS — R0989 Other specified symptoms and signs involving the circulatory and respiratory systems: Secondary | ICD-10-CM

## 2019-07-23 NOTE — Progress Notes (Signed)
He presents today chief complaint of a painful reactive hyperkeratotic lesion subfifth met head of the left foot.  She also complains of painful elongated toenails.  Objective: Vital signs are stable he is alert and oriented x3 pulses are palpable right nonpalpable left..  No open lesions or wounds are noted.  Toenails are long thick yellow dystrophic-like mycotic multiple reactive hyperkeratotic lesions plantar aspect of the fifth metatarsal head area.  Vascular studies come back as limited blood flow to the left lower extremity.  Assessment: Porokeratosis and pain limb secondary to onychomycosis.  Peripheral vascular disease left.  Plan: Discussed etiology pathology conservative surgical therapies at this point time went ahead and debrided reactive hyperkeratotic lesion debrided toenails 1 through 5 bilateral.

## 2019-07-25 ENCOUNTER — Ambulatory Visit: Payer: Medicare PPO | Attending: Internal Medicine

## 2019-07-25 DIAGNOSIS — Z23 Encounter for immunization: Secondary | ICD-10-CM

## 2019-07-25 NOTE — Progress Notes (Signed)
   Covid-19 Vaccination Clinic  Name:  [name] [name]    MRN: [id number] DOB: [birth year]  07/25/2019  [name] was observed post Covid-19 immunization for 15 minutes without incident. He was provided with Vaccine Information Sheet and instruction to access the V-Safe system.   [name] was instructed to call 911 with any severe reactions post vaccine: [name] Difficulty breathing  . Swelling of face and throat  . A fast heartbeat  . A bad rash all over body  . Dizziness and weakness   Immunizations Administered    Name Date Dose VIS Date Route   Moderna COVID-19 Vaccine 07/25/2019 10:28 AM 0.5 mL 03/12/2019 Intramuscular   Manufacturer: Moderna   Lot: [id number]   [id number]

## 2019-08-05 ENCOUNTER — Telehealth: Payer: Self-pay | Admitting: Cardiovascular Disease

## 2019-08-05 NOTE — Telephone Encounter (Signed)
Spoke with patient who has been scheduled for appointment tomorrow He had bilateral arm numbness that lasted about 30 minutes Denies any shortness of breath, chest pain, or any other symptoms Patient will keep appointment tomorrow as scheduled, advised to go to ED if develops any chest pains/numbness

## 2019-08-05 NOTE — Progress Notes (Signed)
Cardiology Office Note:    Date:  08/06/2019   ID:  [name] [name] 12-28-1945, MRN [id number]  PCP:  [name], MD  Cardiologist:  Kirk Ruths, MD   Referring MD: [name], MD   Chief Complaint  Patient presents with  . Follow-up    numbness in arms two night ago    History of Present Illness:    [name] [name] is a 74 y.o. 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 Echo 2018 with normal EF and grade 1 DD, mild MR. He has done well from a cardiac standpoint. Dr. Stanford Breed D/C'ed plavix 09/2018.  He was referred to [facility] for PVD evaluation after his podiatrist was concerned about circulation. He was evaluated by Kerin Ransom [facility] and Dr. Gwenlyn Found for abnormal left ABI of 0.57. Because he was not symptomatic, conservative management was recommended.   Pt wife called 08/05/19 stating the patient had numbness in both arms Sunday night to the point that he couldn't hold anything. She gave him 324 mg ASA. No other symptoms. No numbness the morning after. He was placed on my schedule.   He presents today alone. He worked in the yard on Sunday. After washing his hands, he felt his left arm go numb and then limp. He also reports some disorientation. He placed his arm on the kitchen table and reports not knowing which arm was which - loss of coordination of his limbs, per his report. He drank a bottle of water and took 4 baby ASA. The entire episodes was 15-20 min and then symptoms began resolving. He denies chest pain, shortness of breath, palpitations, and syncope.  He also reports that on Saturday, he stumbled and fell. He does not think he hit his head. He denies dizziness, palpitations, chest pain, or dyspnea prior to his fall. He reports feeling "a little slow to get started" that morning. He did not lose consciousness, but is not entirely sure of the circumstances leading to his  fall.  Friday he also suffered a nose bleed, which is unusual for him. Two weeks prior he had his first COVID-19 vaccine Therapist, music). Upon further questioning, he reports intermittent vision changes and episodes of knocking glasses over at the dinner table. Again, no complaints of anginal-type symptoms. He reports taking ASA and plavix intermittently - will take ASA for 4 days, then take a plavix.   Past Medical History:  Diagnosis Date  . Constipation   . Diabetes mellitus without complication ([place])    borderline/ pre diabetic  . NSTEMI (non-ST elevated myocardial infarction) ([place])    at [place]  . Stroke ([place])    L side - 40 years ago    Past Surgical History:  Procedure Laterality Date  . CARDIAC CATHETERIZATION    . CORONARY ARTERY BYPASS GRAFT     at [facility]    Current Medications: Current Meds  Medication Sig  . acetaminophen (TYLENOL) 650 MG CR tablet Take 650 mg by mouth QID.  [name] aspirin EC 81 MG tablet Take 81 mg by mouth as directed.   . [DISCONTINUED] clopidogrel (PLAVIX) 75 MG tablet Take 75 mg by mouth daily.     Allergies:   Patient has no known allergies.   Social History   Socioeconomic History  . Marital status: Married    Spouse name: Not on file  . Number of children: 2  . Years  of education: Not on file  . Highest education level: Not on file  Occupational History  . Not on file  Tobacco Use  . Smoking status: Former Smoker    Quit date: 04/11/1968    Years since quitting: 51.3  . Smokeless tobacco: Never Used  . Tobacco comment: quit in the 1970s  Substance and Sexual Activity  . Alcohol use: Yes    Comment: Rare  . Drug use: No  . Sexual activity: Not on file  Other Topics Concern  . Not on file  Social History Narrative  . Not on file   Social Determinants of Health   Financial Resource Strain:   . Difficulty of Paying Living Expenses:   Food Insecurity:   . Worried About Charity fundraiser in the Last Year:   . Arboriculturist in the  Last Year:   Transportation Needs:   . Film/video editor (Medical):   [name] Lack of Transportation (Non-Medical):   Physical Activity:   . Days of Exercise per Week:   . Minutes of Exercise per Session:   Stress:   . Feeling of Stress :   Social Connections:   . Frequency of Communication with Friends and Family:   . Frequency of Social Gatherings with Friends and Family:   . Attends Religious Services:   . Active Member of Clubs or Organizations:   . Attends Archivist Meetings:   [name] Marital Status:      Family History: The patient's family history includes Liver disease in his father.  ROS:   Please see the history of present illness.     All other systems reviewed and are negative.  EKGs/Labs/Other Studies Reviewed:    The following studies were reviewed today:  Echo 2018: Study Conclusions   - Left ventricle: LVEF is approximately 55% with very small region  of apical hypokinesis. Systolic function was normal. The  estimated ejection fraction was in the range of 50% to 55%.  Doppler parameters are consistent with abnormal left ventricular  relaxation (grade 1 diastolic dysfunction).  - Mitral valve: There was mild regurgitation.    EKG:  EKG is ordered today.  The ekg ordered today demonstrates sinus rhythm with HR 58, TWI V3  Recent Labs: No results found for requested labs within last 8760 hours.  Recent Lipid Panel    Component Value Date/Time   CHOL 144 11/15/2016 0929   TRIG 70 11/15/2016 0929   HDL 46 11/15/2016 0929   CHOLHDL 3.1 11/15/2016 0929   LDLCALC 84 11/15/2016 0929    Physical Exam:    VS:  BP 114/72 Comment: right arm  Pulse 63   Ht 5\' 5"  (1.651 m)   Wt 175 lb 6.4 oz (79.6 kg)   BMI 29.19 kg/m     Wt Readings from Last 3 Encounters:  08/06/19 175 lb 6.4 oz (79.6 kg)  05/07/19 181 lb (82.1 kg)  04/15/19 174 lb (78.9 kg)     GEN: Well nourished, well developed in no acute distress HEENT: Normal NECK: No JVD;  No carotid bruits LYMPHATICS: No lymphadenopathy CARDIAC: RRR, no murmurs, rubs, gallops RESPIRATORY:  Clear to auscultation without rales, wheezing or rhonchi  ABDOMEN: Soft, non-tender, non-distended MUSCULOSKELETAL:  No edema; No deformity  SKIN: Warm and dry NEUROLOGIC:  Alert and oriented x 3 PSYCHIATRIC:  Normal affect   ASSESSMENT:    1. Left arm weakness   2. Disorientation   3. Fall, initial encounter   4.  History of stroke   5. Coronary artery disease involving native coronary artery of native heart without angina pectoris   6. Hx of CABG   7. Dyslipidemia, goal LDL below 70   8. PVD (peripheral vascular disease) (HCC)    PLAN:    In order of problems listed above:  Left arm numbness Episode of disorientation Fall  Hx of stroke in 1980s with residual left sided weakness - no loss of consciousness - symptoms are concerning for neurological etiology, such as TIA - he does not currently follow with neurology - he has been alternating ASA and plavix every few days - discussed that he needs to be on one consistently for the best benefit.  - since Dr. Stanford Breed previously D/C'ed plavix and continued ASA, I recommended taking ASA daily - neurology may switch him to plavix - I have sent an ambulatory referral to neurology, but appreciate PCP input - I will send my note to Dr. Stanford Breed and will update with any additional suggestions   CAD s/p CABG x 3 (2017) - continue ASA (plavix discontinued per note on 09/28/18, but he has been taking ASA and plavix intermittently, as above) - EKG with TWI in lead V3, otherwise unchanged - he denies anginal symptoms - if no neurological cause found to explain his symptoms, may revisit ischemic evaluation for atypical ACS presentation   Hyperlipidemia with LDL goal < 70 - pt takes crestro 40 mg once weekly for side effects - I do not see an updated lipid profile - he does not want to check his cholesterol today - reinforced the need  for LDL control given his disease - he will check lipids with his PCP   PVD - abnormal left ABI - no claudication - continue conservative management    Follow up in 6 months.    Medication Adjustments/Labs and Tests Ordered: Current medicines are reviewed at length with the patient today.  Concerns regarding medicines are outlined above.  Orders Placed This Encounter  Procedures  . Ambulatory referral to Neurology  . EKG 12-Lead   No orders of the defined types were placed in this encounter.   Signed, Ledora Bottcher, [place]  08/06/2019 10:47 AM    [facility]

## 2019-08-05 NOTE — Telephone Encounter (Signed)
New Message   Pts wife is calling and says last night the pt experienced some numbness in both arms but more in the right than the left. She says he couldn't hold anything and she gave him 4 baby aspirins.  Pt is not experiencing any other symptoms and no numbness this morning    Please advise

## 2019-08-06 ENCOUNTER — Ambulatory Visit (INDEPENDENT_AMBULATORY_CARE_PROVIDER_SITE_OTHER): Payer: Medicare PPO | Admitting: Physician Assistant

## 2019-08-06 ENCOUNTER — Other Ambulatory Visit: Payer: Self-pay

## 2019-08-06 ENCOUNTER — Encounter: Payer: Self-pay | Admitting: Physician Assistant

## 2019-08-06 VITALS — BP 114/72 | HR 63 | Ht 65.0 in | Wt 175.4 lb

## 2019-08-06 DIAGNOSIS — I2510 Atherosclerotic heart disease of native coronary artery without angina pectoris: Secondary | ICD-10-CM

## 2019-08-06 DIAGNOSIS — R410 Disorientation, unspecified: Secondary | ICD-10-CM | POA: Diagnosis not present

## 2019-08-06 DIAGNOSIS — Z8673 Personal history of transient ischemic attack (TIA), and cerebral infarction without residual deficits: Secondary | ICD-10-CM

## 2019-08-06 DIAGNOSIS — R29898 Other symptoms and signs involving the musculoskeletal system: Secondary | ICD-10-CM

## 2019-08-06 DIAGNOSIS — E785 Hyperlipidemia, unspecified: Secondary | ICD-10-CM

## 2019-08-06 DIAGNOSIS — I739 Peripheral vascular disease, unspecified: Secondary | ICD-10-CM

## 2019-08-06 DIAGNOSIS — Z951 Presence of aortocoronary bypass graft: Secondary | ICD-10-CM

## 2019-08-06 DIAGNOSIS — W19XXXA Unspecified fall, initial encounter: Secondary | ICD-10-CM

## 2019-08-06 NOTE — Patient Instructions (Signed)
Medication Instructions:   Take Aspirin 81 mg daily.  *If you need a refill on your cardiac medications before your next appointment, please call your pharmacy*   Lab Work: Please have your cholesterol checked. Because of your CAD, your LDL (bad cholesterol goal) is less than 70.  If you have labs (blood work) drawn today and your tests are completely normal, you will receive your results only by: [name] MyChart Message (if you have MyChart) OR . A paper copy in the mail If you have any lab test that is abnormal or we need to change your treatment, we will call you to review the results.    Follow-Up: At [facility], you and your health needs are our priority.  As part of our continuing mission to provide you with exceptional heart care, we have created designated Provider Care Teams.  These Care Teams include your primary Cardiologist (physician) and Advanced Practice Providers (APPs -  Physician Assistants and Nurse Practitioners) who all work together to provide you with the care you need, when you need it.  We recommend signing up for the patient portal called "MyChart".  Sign up information is provided on this After Visit Summary.  MyChart is used to connect with patients for Virtual Visits (Telemedicine).  Patients are able to view lab/test results, encounter notes, upcoming appointments, etc.  Non-urgent messages can be sent to your provider as well.   To learn more about what you can do with MyChart, go to [email or web address].    Your next appointment:   6 month(s)  The format for your next appointment:   In Person  Provider:   You may see Kirk Ruths, MD or one of the following Advanced Practice Providers on your designated Care Team:    Fabian Sharp, [place]   Other Instructions  Please call our office 2 months in advance to schedule your follow-up appointment.  Please follow-up with your PCP.  You have been referred to Neurology. They will call you to schedule this  appointment.

## 2019-08-08 NOTE — Progress Notes (Signed)
NEUROLOGY CONSULTATION NOTE  [name] [name] MRN: [id number] DOB: 07-06-1945  Referring provider: Ledora Bottcher, PA Primary care provider: Zoe A. Nolon Rod, MD  Reason for consult:  Right arm weakness  HISTORY OF PRESENT ILLNESS: [name] [name] is a 74 year old right-handed black male with CAD s//p NSTEMI/CABG and history of stroke in 1980 who presents for right arm weakness and numbness.  He is accompanied by his wife who supplements history.  History also supplemented by referring provider's note.  On April 25, he was washing his hands after working in the yard when he felt his right arm go numb and limp. He asked for a drink of water but couldn't hold the glass.  After 20 minutes, he went to take a nap.  When he woke up an hour later, he had feeling back and was able to move his arm but it felt sore.  No associated pain, visual disturbance, slurred speech, facial droop, chest pain, shortness of breath, palpitations or dizziness.  His wife gave him a full-strength ASA.  He stayed in bed to rest the following day.  He had been alternating between taking ASA for 4 days and then Plavix.  He followed up with cardiology on April 27 who instructed him to just take ASA.  He is prescribed Crestor 40mg  once a week due to intolerance but he isn't taking it.  He had a stroke in 1980, presenting as left sided hemiparesis.  No specific cause was identified.  Still has slight limp.   Echocardiogram from 10/03/2016 showed EF 50-55%  PAST MEDICAL HISTORY: Past Medical History:  Diagnosis Date  . Constipation   . Diabetes mellitus without complication ([place])    borderline/ pre diabetic  . NSTEMI (non-ST elevated myocardial infarction) ([place])    at [place]  . Stroke ([place])    L side - 40 years ago    PAST SURGICAL HISTORY: Past Surgical History:  Procedure Laterality Date  . CARDIAC CATHETERIZATION    . CORONARY ARTERY BYPASS GRAFT     at [facility]    MEDICATIONS: Current Outpatient  Medications on File Prior to Visit  Medication Sig Dispense Refill  . acetaminophen (TYLENOL) 650 MG CR tablet Take 650 mg by mouth QID.    [name] aspirin EC 81 MG tablet Take 81 mg by mouth as directed.     . rosuvastatin (CRESTOR) 40 MG tablet Take 1 tablet (40 mg total) by mouth daily. 90 tablet 3   No current facility-administered medications on file prior to visit.    ALLERGIES: No Known Allergies  FAMILY HISTORY: Family History  Problem Relation Age of Onset  . Liver disease Father     SOCIAL HISTORY: Social History   Socioeconomic History  . Marital status: Married    Spouse name: Not on file  . Number of children: 2  . Years of education: Not on file  . Highest education level: Not on file  Occupational History  . Not on file  Tobacco Use  . Smoking status: Former Smoker    Quit date: 04/11/1968    Years since quitting: 51.3  . Smokeless tobacco: Never Used  . Tobacco comment: quit in the 1970s  Substance and Sexual Activity  . Alcohol use: Yes    Comment: Rare  . Drug use: No  . Sexual activity: Not on file  Other Topics Concern  . Not on file  Social History Narrative  . Not on file   Social Determinants of Health   Financial  Resource Strain:   . Difficulty of Paying Living Expenses:   Food Insecurity:   . Worried About Charity fundraiser in the Last Year:   . Arboriculturist in the Last Year:   Transportation Needs:   . Film/video editor (Medical):   [name] Lack of Transportation (Non-Medical):   Physical Activity:   . Days of Exercise per Week:   . Minutes of Exercise per Session:   Stress:   . Feeling of Stress :   Social Connections:   . Frequency of Communication with Friends and Family:   . Frequency of Social Gatherings with Friends and Family:   . Attends Religious Services:   . Active Member of Clubs or Organizations:   . Attends Archivist Meetings:   [name] Marital Status:   Intimate Partner Violence:   . Fear of Current or  Ex-Partner:   . Emotionally Abused:   [name] Physically Abused:   . Sexually Abused:     REVIEW OF SYSTEMS: Constitutional: No fevers, chills, or sweats, no generalized fatigue, change in appetite Eyes: No visual changes, double vision, eye pain Ear, nose and throat: No hearing loss, ear pain, nasal congestion, sore throat Cardiovascular: No chest pain, palpitations Respiratory:  No shortness of breath at rest or with exertion, wheezes GastrointestinaI: No nausea, vomiting, diarrhea, abdominal pain, fecal incontinence Genitourinary:  No dysuria, urinary retention or frequency Musculoskeletal:  No neck pain, back pain Integumentary: No rash, pruritus, skin lesions Neurological: as above Psychiatric: No depression, insomnia, anxiety Endocrine: No palpitations, fatigue, diaphoresis, mood swings, change in appetite, change in weight, increased thirst Hematologic/Lymphatic:  No purpura, petechiae. Allergic/Immunologic: no itchy/runny eyes, nasal congestion, recent allergic reactions, rashes  PHYSICAL EXAM: Blood pressure 133/77, pulse 61, resp. rate 18, height 5\' 5"  (1.651 m), weight 177 lb (80.3 kg), SpO2 96 %. General: No acute distress.  Patient appears well-groomed.  Head:  Normocephalic/atraumatic Eyes:  fundi examined but not visualized Neck: supple, no paraspinal tenderness, full range of motion Back: No paraspinal tenderness Heart: regular rate and rhythm Lungs: Clear to auscultation bilaterally. Vascular: No carotid bruits. Neurological Exam: Mental status: alert and oriented to person, place, and time, recent and remote memory intact, fund of knowledge intact, attention and concentration intact, speech fluent and not dysarthric, language intact. Cranial nerves: CN I: not tested CN II: pupils equal, round and reactive to light, visual fields intact CN III, IV, VI:  full range of motion, no nystagmus, no ptosis CN V: facial sensation intact CN VII: upper and lower face  symmetric CN VIII: hearing intact CN IX, X: gag intact, uvula midline CN XI: sternocleidomastoid and trapezius muscles intact CN XII: tongue midline Bulk & Tone: normal, no fasciculations. Motor:  5-/5 left upper extremity and left hip flexion, otherwise 5/5. Sensation:  Pinprick and vibration sensation intact. Deep Tendon Reflexes:  2+ throughout, toes downgoing.  Finger to nose testing:  Without dysmetria.  Heel to shin:  Without dysmetria.  Gait:  Limp.  Able to turn.  Difficulty with tandem walk. Romberg negative.  IMPRESSION: Probable left hemispheric stroke vs TIA Hyperlipidemia CAD s/p NSTEM and CABG History of stroke   PLAN: 1.  Recommend dual antiplatelet therapy (ASA 81mg  daily and Plavix 75mg  daily) for the next 15 days, followed by Plavix 75mg  daily alone. 2.  Recommend taking Crestor (LDL goal less than 70).  Will obtain most recent lipid panel from his PCP at [facility]. 3.  Glycemic control (Hgb A1c goal less than 7).  Will obtain most recent A1c from his PCP at [facility]. 4.  Check MRI of brain and MRA of head and neck 5.  Check echocardiogram with bubble study 6.  Further recommendations pending results. 7.  Otherwise, follow up in 4 months.  Thank you for allowing me to take part in the care of this patient.  Metta Clines, DO  CC:  Ledora Bottcher, [place]

## 2019-08-09 ENCOUNTER — Other Ambulatory Visit (HOSPITAL_COMMUNITY): Payer: Self-pay | Admitting: Neurology

## 2019-08-09 ENCOUNTER — Telehealth: Payer: Self-pay

## 2019-08-09 ENCOUNTER — Other Ambulatory Visit: Payer: Self-pay

## 2019-08-09 ENCOUNTER — Telehealth: Payer: Self-pay | Admitting: *Deleted

## 2019-08-09 ENCOUNTER — Ambulatory Visit (INDEPENDENT_AMBULATORY_CARE_PROVIDER_SITE_OTHER): Payer: Medicare PPO | Admitting: Neurology

## 2019-08-09 ENCOUNTER — Other Ambulatory Visit: Payer: Self-pay | Admitting: Neurology

## 2019-08-09 ENCOUNTER — Encounter: Payer: Self-pay | Admitting: Neurology

## 2019-08-09 VITALS — BP 133/77 | HR 61 | Resp 18 | Ht 65.0 in | Wt 177.0 lb

## 2019-08-09 DIAGNOSIS — Z8673 Personal history of transient ischemic attack (TIA), and cerebral infarction without residual deficits: Secondary | ICD-10-CM | POA: Diagnosis not present

## 2019-08-09 DIAGNOSIS — Z951 Presence of aortocoronary bypass graft: Secondary | ICD-10-CM

## 2019-08-09 DIAGNOSIS — E785 Hyperlipidemia, unspecified: Secondary | ICD-10-CM | POA: Diagnosis not present

## 2019-08-09 DIAGNOSIS — R29898 Other symptoms and signs involving the musculoskeletal system: Secondary | ICD-10-CM | POA: Diagnosis not present

## 2019-08-09 NOTE — Telephone Encounter (Signed)
Left message for patient to call and schedule Echo with bubble ordered by Dr. Loretta Plume

## 2019-08-09 NOTE — Telephone Encounter (Signed)
Spoke to [employer] at [facility] pt last Medicare Wellness visit 10/2018. Lst labs done at that visit. Lipid panel LDL: 123, HDL: 53, Triglyceride: 190, and Total Chol: 214

## 2019-08-09 NOTE — Telephone Encounter (Addendum)
Pt wife advised, MRI and MRA scheduled for 08/15/19 at 1:30pm at [facility] long  pt is arrive at 12:30pm.   Will fax records request over to [facility] medical Dr. Mikeal Hawthorne SUn

## 2019-08-09 NOTE — Patient Instructions (Addendum)
1.  Take aspirin 81mg  daily and Plavix 75mg  daily for 15 days.  Then stop aspirin and just take Plavix 75mg  daily alone. 2.  Start Crestor.   3. Check MRI of brain and MRA of head and neck 4.  Check echocardiogram with bubble study 5.  Follow up in 4 months.

## 2019-08-12 ENCOUNTER — Other Ambulatory Visit: Payer: Self-pay | Admitting: Cardiology

## 2019-08-15 ENCOUNTER — Other Ambulatory Visit: Payer: Self-pay

## 2019-08-15 ENCOUNTER — Other Ambulatory Visit: Payer: Self-pay | Admitting: Neurology

## 2019-08-15 ENCOUNTER — Ambulatory Visit (HOSPITAL_COMMUNITY)
Admission: RE | Admit: 2019-08-15 | Discharge: 2019-08-15 | Disposition: A | Discharge status: Home / Self Care | Payer: Medicare PPO | Source: Ambulatory Visit | Attending: Neurology | Admitting: Neurology

## 2019-08-15 DIAGNOSIS — Z8673 Personal history of transient ischemic attack (TIA), and cerebral infarction without residual deficits: Secondary | ICD-10-CM | POA: Diagnosis present

## 2019-08-15 DIAGNOSIS — I6523 Occlusion and stenosis of bilateral carotid arteries: Secondary | ICD-10-CM | POA: Insufficient documentation

## 2019-08-15 IMAGING — MR MR MRA HEAD W/O CM
9 series · 48 of 48 positions shown · non-contrast
Comparison: None available.

CLINICAL DATA: Initial evaluation for history of stroke. Right arm
weakness and numbness.



[Series 8: T1 · sagittal · 5.0mm · 0.75mm/px · 3 of 24 slices shown (1 of 2)]
[im 1/24]
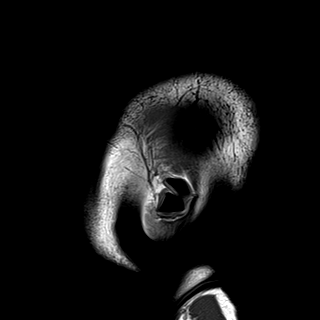
[im 12/24]
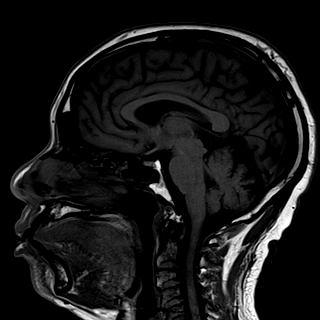
[im 24/24]
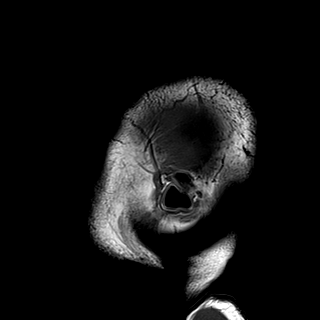

[Series 9: T2 · axial · 5.0mm · 0.62mm/px · z∈[-59,+103]mm · 2 of 26 slices shown (1 of 2)]
[im 1/26]
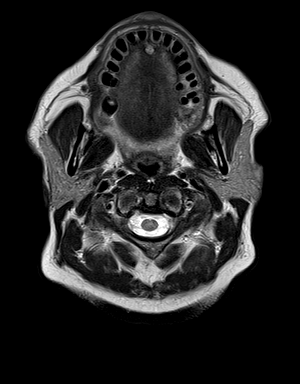
[im 26/26]
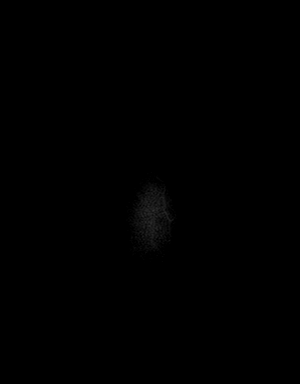

[Series 10: mip_images(sw) · axial · 24.0mm · 0.75mm/px · z∈[-50,+94]mm · 5 of 49 slices shown]
[im 1/49]
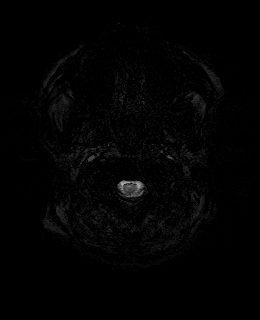
[im 13/49]
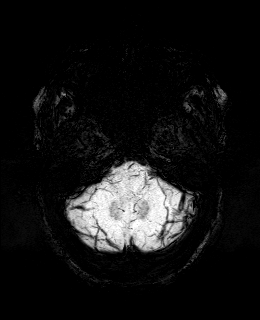
[im 25/49]
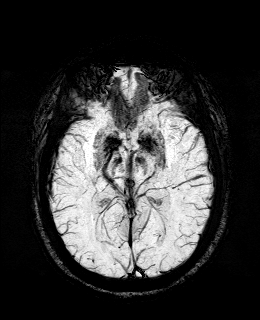
[im 37/49]
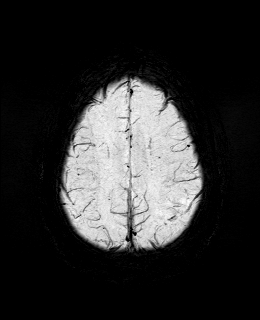
[im 49/49]
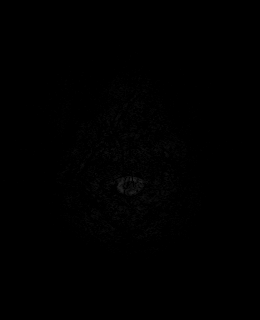

[Series 11: swi_images · axial · 3.0mm · 0.75mm/px · z∈[-60,+104]mm · 5 of 56 slices shown]
[im 1/56]
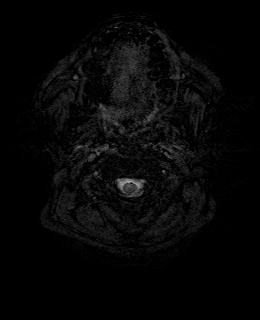
[im 14/56]
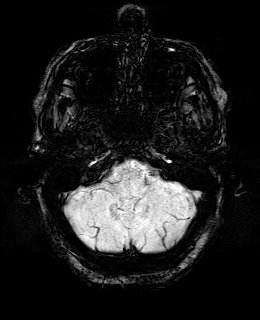
[im 28/56]
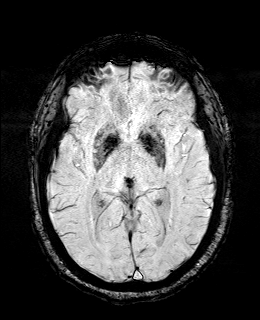
[im 42/56]
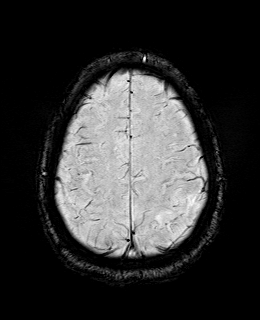
[im 56/56]
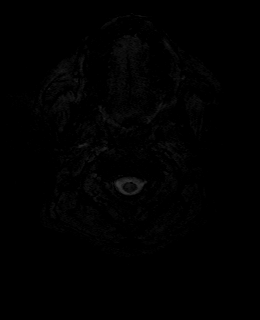

[Series 12: FLAIR · axial · 3.0mm · 0.75mm/px · z∈[-54,+98]mm · 5 of 52 slices shown]
[im 1/52]
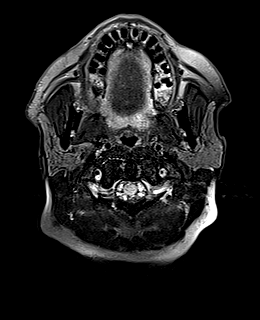
[im 13/52]
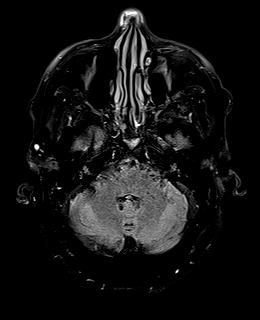
[im 26/52]
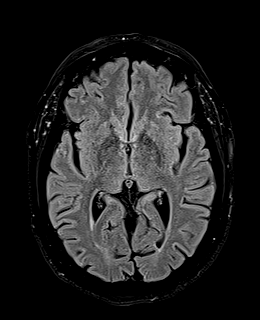
[im 39/52]
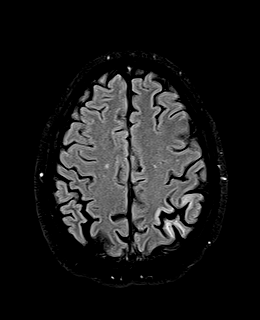
[im 52/52]
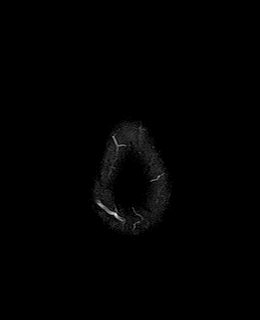

[Series 13: T1 · axial · 1.0mm · 0.94mm/px · z∈[-57,+101]mm · 15 of 160 slices shown (2 of 2)]
[im 1/160]
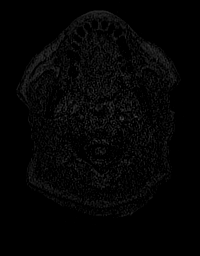
[im 12/160]
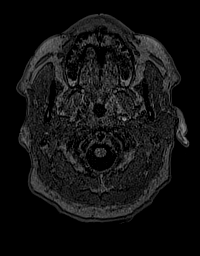
[im 23/160]
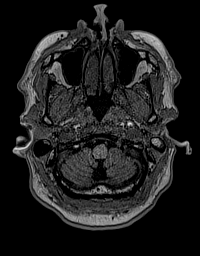
[im 35/160]
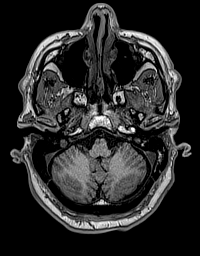
[im 46/160]
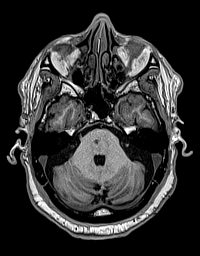
[im 57/160]
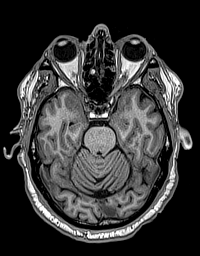
[im 69/160]
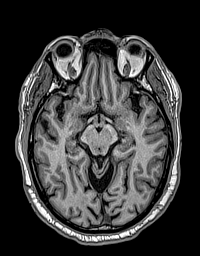
[im 80/160]
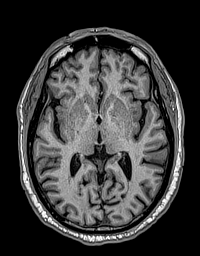
[im 91/160]
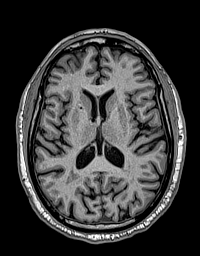
[im 103/160]
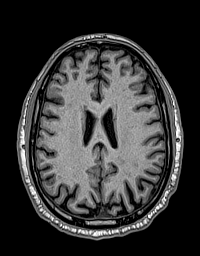
[im 114/160]
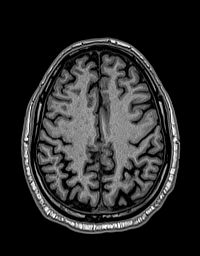
[im 125/160]
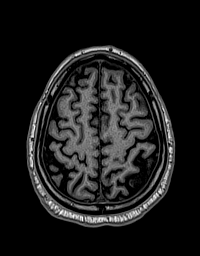
[im 137/160]
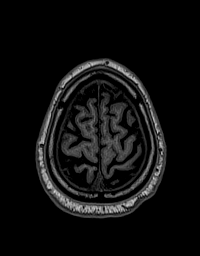
[im 148/160]
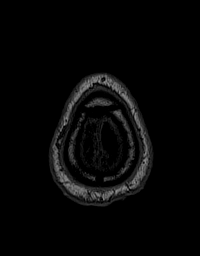
[im 160/160]
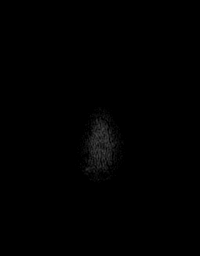

[Series 14: DWI · coronal · 5.0mm · 1.31mm/px · 7 of 72 slices shown (1 of 2)]
[im 1/72]
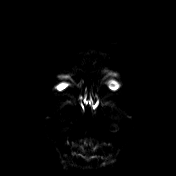
[im 12/72]
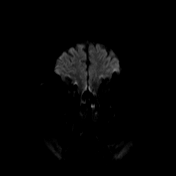
[im 24/72]
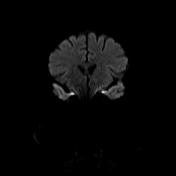
[im 36/72]
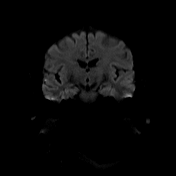
[im 48/72]
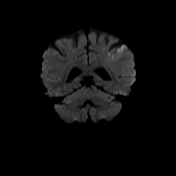
[im 60/72]
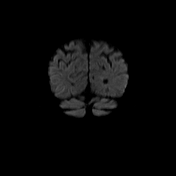
[im 72/72]
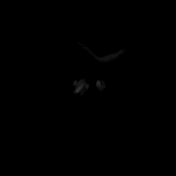

[Series 15: DWI · coronal · 5.0mm · 1.31mm/px · 3 of 36 slices shown (2 of 2)]
[im 1/36]
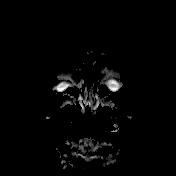
[im 18/36]
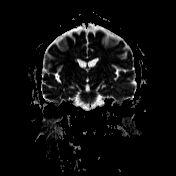
[im 36/36]
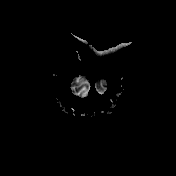

[Series 16: T2 · coronal · 5.0mm · 0.57mm/px · 3 of 35 slices shown (2 of 2)]
[im 1/35]
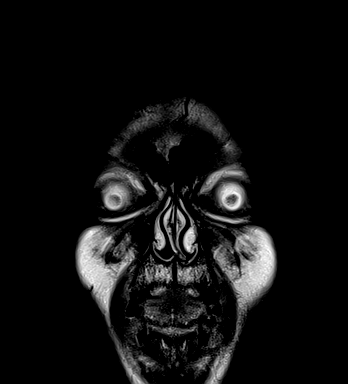
[im 18/35]
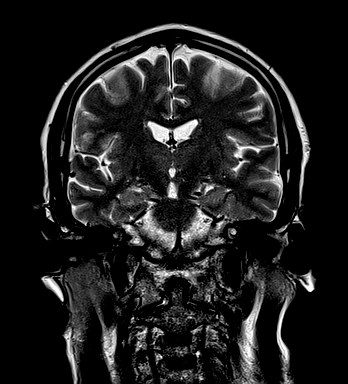
[im 35/35]
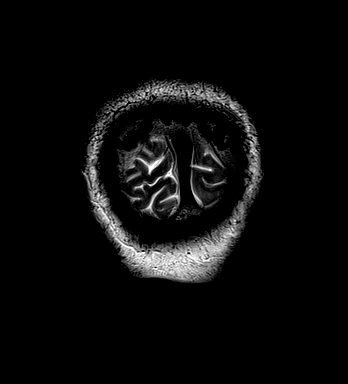

[48 of 48 positions shown; findings below may reference images not displayed]

FINDINGS: MRI HEAD FINDINGS

Brain: Cerebral volume within normal limits for age. Patchy T2/FLAIR
hyperintensity within the periventricular and deep white matter both
cerebral hemispheres most consistent with chronic small vessel
ischemic disease, mild in nature. Superimposed remote lacunar
infarct present at the right paramedian pons (series 8, image 8).

Focal area of mild diffusion abnormality seen involving the high
left parietal cortex (series 5, image 92). Associated ADC signal has
largely resolved, with evidence for early encephalomalacia. Finding
consistent with a subacute to chronic posterior left MCA territory
infarct. No associated hemorrhage or mass effect. No other evidence
for acute or subacute ischemia. Gray-white matter differentiation
otherwise maintained. No other encephalomalacia to suggest chronic
cortical infarction. No foci of susceptibility artifact to suggest
acute or chronic intracranial hemorrhage.

No mass lesion, midline shift or mass effect. No hydrocephalus or
extra-axial fluid collection. Pituitary gland suprasellar region
normal. Midline structures intact.

Vascular: Absent flow void within the left vertebral artery,
suggesting slow flow and/or occlusion. Major intracranial vascular
flow voids otherwise maintained.

Skull and upper cervical spine: Craniocervical junction within
normal limits. Upper cervical spine normal. Bone marrow signal
intensity within normal limits. No scalp soft tissue abnormality.

Sinuses/Orbits: Globes and orbital soft tissues within normal
limits. Mild scattered mucosal thickening noted throughout the
paranasal sinuses. Trace right mastoid effusion noted, of doubtful
significance. Inner ear structures within normal limits.

Other: None.

MRA HEAD FINDINGS

ANTERIOR CIRCULATION:

Visualized distal cervical segments of the internal carotid arteries
are widely patent with symmetric antegrade flow. Petrous segments
widely patent. Mild atheromatous irregularity seen throughout the
cavernous/supraclinoid ICAs without high-grade stenosis or other
abnormality. ICA termini well perfused. A1 segments patent
bilaterally. Normal anterior communicating artery complex. Anterior
cerebral arteries widely patent to their distal aspects without
stenosis. No M1 stenosis or occlusion. Normal MCA bifurcations.
Distal MCA branches well perfused and symmetric.

POSTERIOR CIRCULATION:

Right vertebral artery appears slightly dominant. Mild atheromatous
irregularity within the right V4 segment without high-grade
stenosis. Right PICA patent. Left vertebral artery occluded at the
skull base, with occlusion of the majority of the left V4 segment.
Some flow related signal seen within the distal left V4 segment,
likely via collateralization. Left PICA not visualized. Dominant
left AICA. Basilar demonstrates mild atheromatous irregularity but
is widely patent to its distal aspect without stenosis. Superior
cerebral arteries patent bilaterally. Left PCA supplied via the
basilar. Fetal type origin of the right PCA supplied via a robust
right posterior communicating artery. Both PCAs widely patent to
their distal aspects without stenosis.

No intracranial aneurysm.

MRA NECK FINDINGS

AORTIC ARCH: Examination mildly limited by lack of IV contrast.

Aortic arch not visualized on this exam. Probable bovine branch
pattern with common origin of the right brachiocephalic and left
common carotid artery. No flow-limiting stenosis seen about the
origin of the great vessels. Partially visualized subclavian
arteries widely patent.

RIGHT CAROTID SYSTEM: Visualized right common carotid artery patent
to the bifurcation without stenosis. Mild atheromatous irregularity
about the right bifurcation without hemodynamically significant
stenosis. Right ICA widely patent distally without stenosis or
occlusion.

LEFT CAROTID SYSTEM: Visualized left CCA widely patent to the
bifurcation without stenosis. Mild atheromatous irregularity about
the left bifurcation with no more than mild stenosis. Left ICA
widely patent distally without stenosis or occlusion.

VERTEBRAL ARTERIES: Both vertebral arteries appear to arise from the
subclavian arteries. Right vertebral artery dominant, with a
diffusely diminutive left vertebral artery. Vertebral arteries
patent within the neck, although flow within the slightly diminutive
left vertebral artery is diffusely attenuated. Left vertebral artery
again seen to occluded at the skull base.
IMPRESSION: MRI HEAD IMPRESSION:

1. Subacute to chronic posterior left MCA territory infarct
involving the left parietal cortex as above. No associated
hemorrhage or mass effect.
2. Underlying age-related cerebral atrophy with mild chronic small
vessel ischemic disease, with additional remote lacunar infarct
involving the right paramedian pons.

MRA HEAD IMPRESSION:

1. Mild atheromatous irregularity involving the carotid siphons
without high-grade stenosis. No large vessel occlusion or
hemodynamically significant stenosis about the anterior circulation.
2. Occlusion of the left vertebral artery at the skull base.
Dominant right vertebral artery remains widely patent as does the
remainder of the posterior circulation.
3. Fetal type origin of the right PCA.
4. No intracranial aneurysm.

MRA NECK IMPRESSION:

1. Mild atheromatous irregularity about the carotid bifurcations
without hemodynamically significant stenosis. Both carotid artery
systems otherwise widely patent within the neck.
2. Wide patency of the vertebral arteries within the neck, although
the left vertebral occludes at the skull base. Right vertebral is
dominant.

## 2019-08-15 IMAGING — MR MR MRA HEAD W/O CM
2 series · 16 of 48 positions shown · non-contrast
Comparison: None available.

CLINICAL DATA: Initial evaluation for history of stroke. Right arm
weakness and numbness.



[Series 5: vessel_scout_head_msum · sagittal · 6.0mm · 0.59mm/px · 6 of 23 slices shown]
[im 1/23]
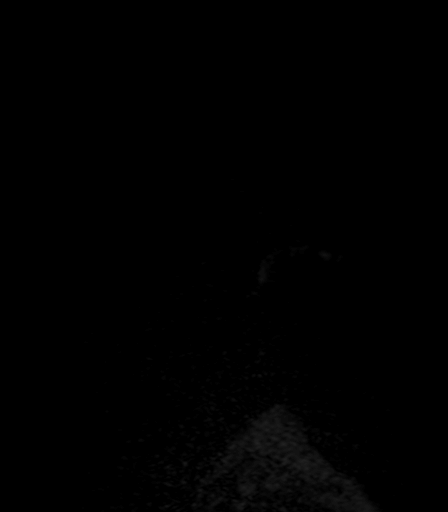
[im 5/23]
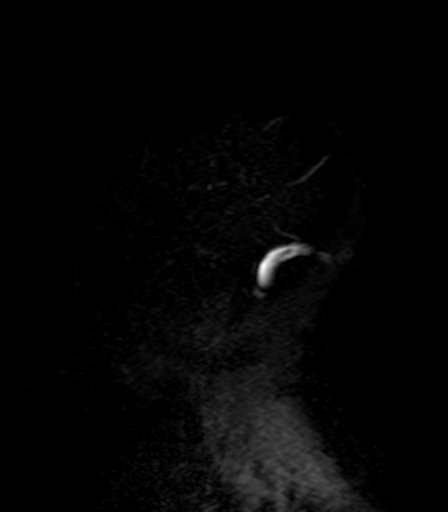
[im 9/23]
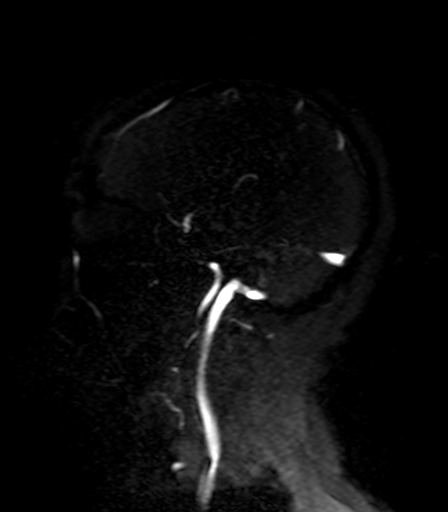
[im 14/23]
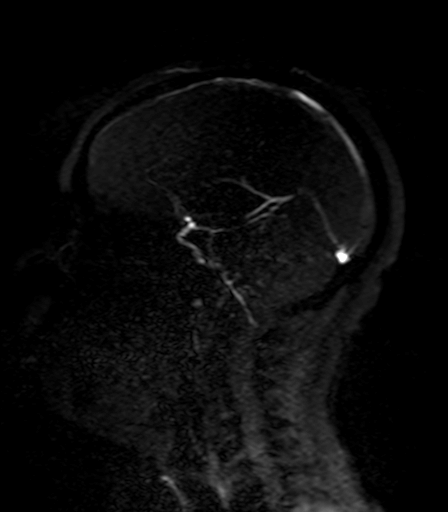
[im 18/23]
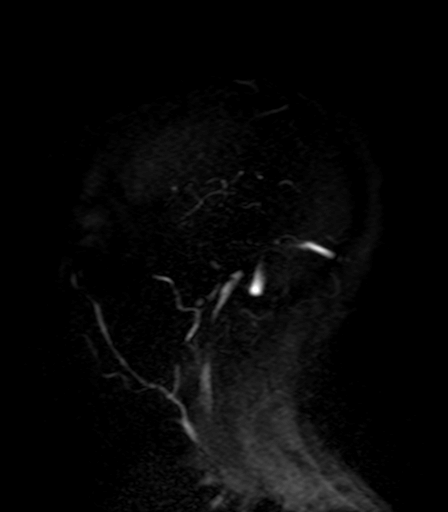
[im 23/23]
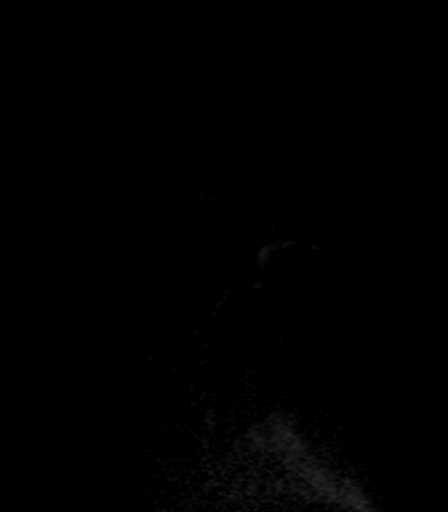

[Series 8: TOF · axial · 0.6mm · 0.35mm/px · z∈[-58,+34]mm · 10 of 172 slices shown]
[im 9/172]
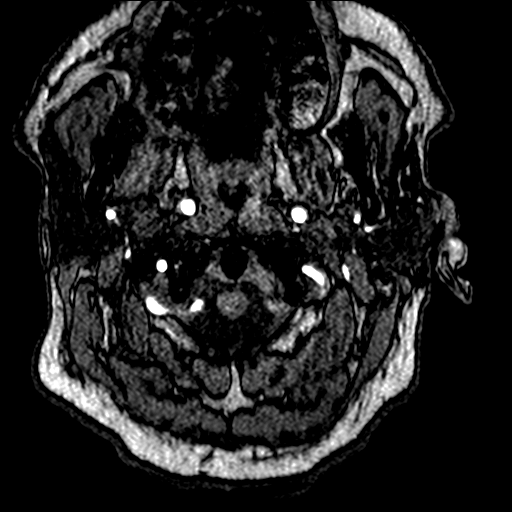
[im 30/172]
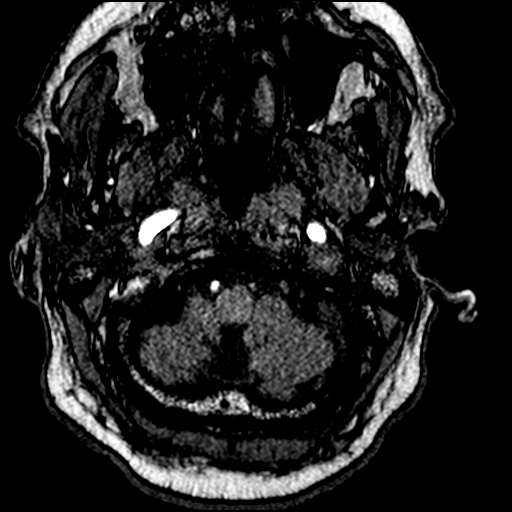
[im 55/172]
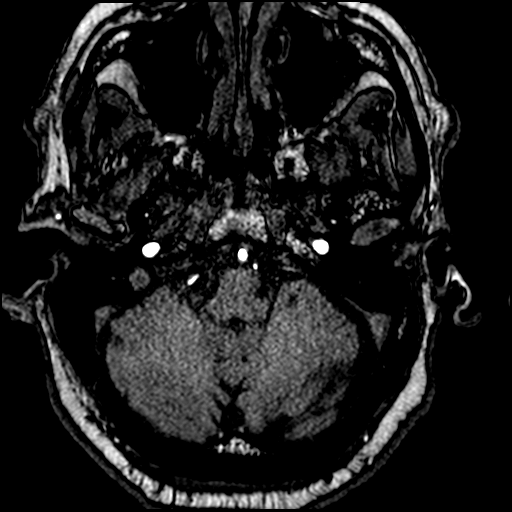
[im 76/172]
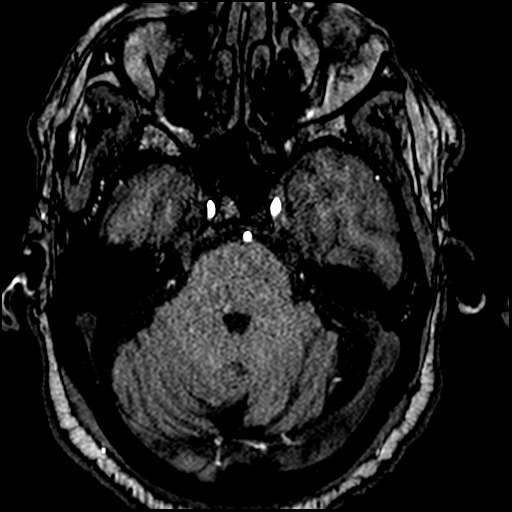
[im 88/172]
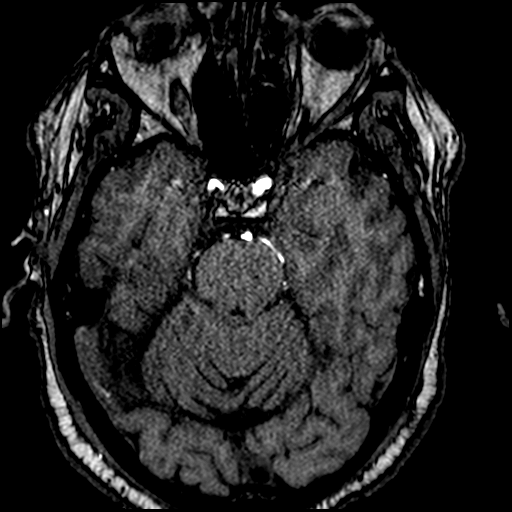
[im 96/172]
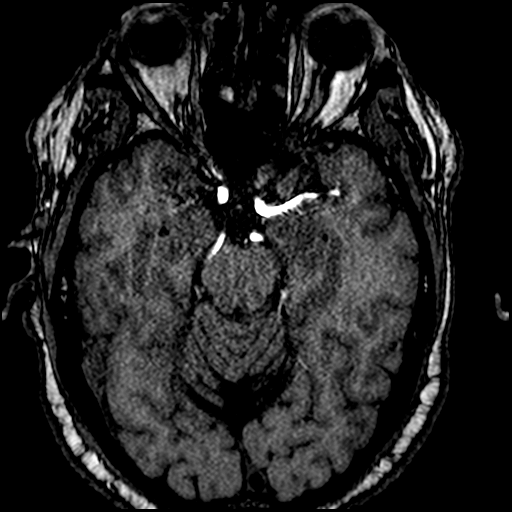
[im 117/172]
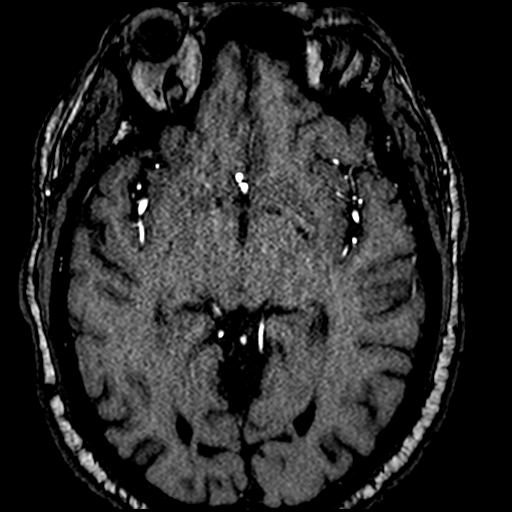
[im 142/172]
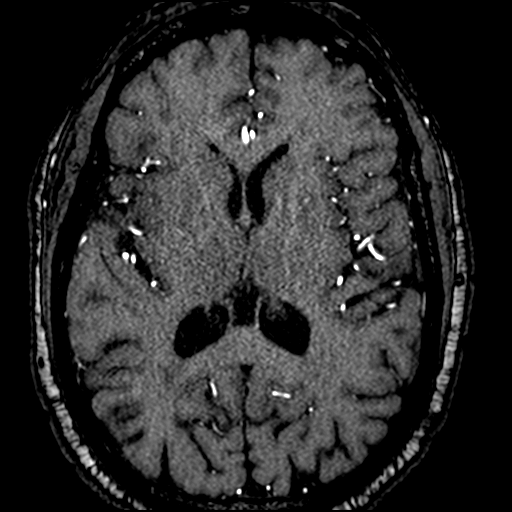
[im 146/172]
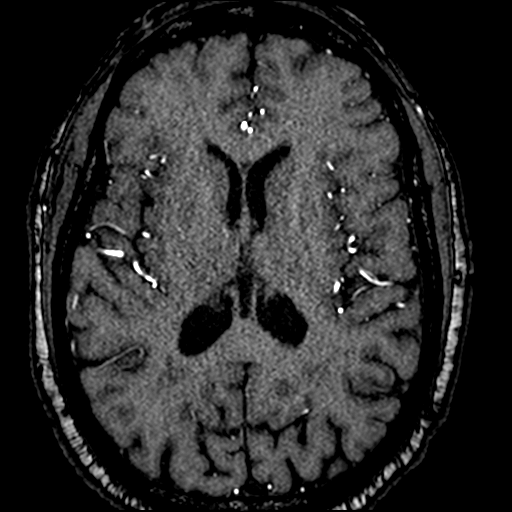
[im 163/172]
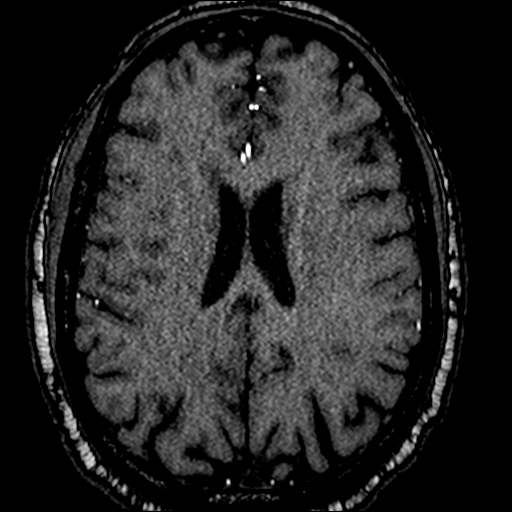

[16 of 48 positions shown; findings below may reference images not displayed]

FINDINGS: MRI HEAD FINDINGS

Brain: Cerebral volume within normal limits for age. Patchy T2/FLAIR
hyperintensity within the periventricular and deep white matter both
cerebral hemispheres most consistent with chronic small vessel
ischemic disease, mild in nature. Superimposed remote lacunar
infarct present at the right paramedian pons (series 8, image 8).

Focal area of mild diffusion abnormality seen involving the high
left parietal cortex (series 5, image 92). Associated ADC signal has
largely resolved, with evidence for early encephalomalacia. Finding
consistent with a subacute to chronic posterior left MCA territory
infarct. No associated hemorrhage or mass effect. No other evidence
for acute or subacute ischemia. Gray-white matter differentiation
otherwise maintained. No other encephalomalacia to suggest chronic
cortical infarction. No foci of susceptibility artifact to suggest
acute or chronic intracranial hemorrhage.

No mass lesion, midline shift or mass effect. No hydrocephalus or
extra-axial fluid collection. Pituitary gland suprasellar region
normal. Midline structures intact.

Vascular: Absent flow void within the left vertebral artery,
suggesting slow flow and/or occlusion. Major intracranial vascular
flow voids otherwise maintained.

Skull and upper cervical spine: Craniocervical junction within
normal limits. Upper cervical spine normal. Bone marrow signal
intensity within normal limits. No scalp soft tissue abnormality.

Sinuses/Orbits: Globes and orbital soft tissues within normal
limits. Mild scattered mucosal thickening noted throughout the
paranasal sinuses. Trace right mastoid effusion noted, of doubtful
significance. Inner ear structures within normal limits.

Other: None.

MRA HEAD FINDINGS

ANTERIOR CIRCULATION:

Visualized distal cervical segments of the internal carotid arteries
are widely patent with symmetric antegrade flow. Petrous segments
widely patent. Mild atheromatous irregularity seen throughout the
cavernous/supraclinoid ICAs without high-grade stenosis or other
abnormality. ICA termini well perfused. A1 segments patent
bilaterally. Normal anterior communicating artery complex. Anterior
cerebral arteries widely patent to their distal aspects without
stenosis. No M1 stenosis or occlusion. Normal MCA bifurcations.
Distal MCA branches well perfused and symmetric.

POSTERIOR CIRCULATION:

Right vertebral artery appears slightly dominant. Mild atheromatous
irregularity within the right V4 segment without high-grade
stenosis. Right PICA patent. Left vertebral artery occluded at the
skull base, with occlusion of the majority of the left V4 segment.
Some flow related signal seen within the distal left V4 segment,
likely via collateralization. Left PICA not visualized. Dominant
left AICA. Basilar demonstrates mild atheromatous irregularity but
is widely patent to its distal aspect without stenosis. Superior
cerebral arteries patent bilaterally. Left PCA supplied via the
basilar. Fetal type origin of the right PCA supplied via a robust
right posterior communicating artery. Both PCAs widely patent to
their distal aspects without stenosis.

No intracranial aneurysm.

MRA NECK FINDINGS

AORTIC ARCH: Examination mildly limited by lack of IV contrast.

Aortic arch not visualized on this exam. Probable bovine branch
pattern with common origin of the right brachiocephalic and left
common carotid artery. No flow-limiting stenosis seen about the
origin of the great vessels. Partially visualized subclavian
arteries widely patent.

RIGHT CAROTID SYSTEM: Visualized right common carotid artery patent
to the bifurcation without stenosis. Mild atheromatous irregularity
about the right bifurcation without hemodynamically significant
stenosis. Right ICA widely patent distally without stenosis or
occlusion.

LEFT CAROTID SYSTEM: Visualized left CCA widely patent to the
bifurcation without stenosis. Mild atheromatous irregularity about
the left bifurcation with no more than mild stenosis. Left ICA
widely patent distally without stenosis or occlusion.

VERTEBRAL ARTERIES: Both vertebral arteries appear to arise from the
subclavian arteries. Right vertebral artery dominant, with a
diffusely diminutive left vertebral artery. Vertebral arteries
patent within the neck, although flow within the slightly diminutive
left vertebral artery is diffusely attenuated. Left vertebral artery
again seen to occluded at the skull base.
IMPRESSION: MRI HEAD IMPRESSION:

1. Subacute to chronic posterior left MCA territory infarct
involving the left parietal cortex as above. No associated
hemorrhage or mass effect.
2. Underlying age-related cerebral atrophy with mild chronic small
vessel ischemic disease, with additional remote lacunar infarct
involving the right paramedian pons.

MRA HEAD IMPRESSION:

1. Mild atheromatous irregularity involving the carotid siphons
without high-grade stenosis. No large vessel occlusion or
hemodynamically significant stenosis about the anterior circulation.
2. Occlusion of the left vertebral artery at the skull base.
Dominant right vertebral artery remains widely patent as does the
remainder of the posterior circulation.
3. Fetal type origin of the right PCA.
4. No intracranial aneurysm.

MRA NECK IMPRESSION:

1. Mild atheromatous irregularity about the carotid bifurcations
without hemodynamically significant stenosis. Both carotid artery
systems otherwise widely patent within the neck.
2. Wide patency of the vertebral arteries within the neck, although
the left vertebral occludes at the skull base. Right vertebral is
dominant.

## 2019-08-15 IMAGING — MR MR MRA NECK W/O CM
3 of 4 series · 13 of 48 positions shown · non-contrast
Comparison: None available.

CLINICAL DATA: Initial evaluation for history of stroke. Right arm
weakness and numbness.



[Series 3: tof_2d_tra · axial · 3.5mm · 0.43mm/px · z∈[-193,-63]mm · 9 of 60 slices shown]
[im 1/60]
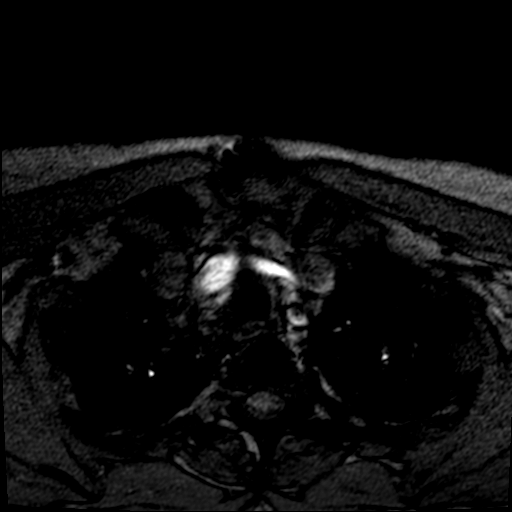
[im 6/60]
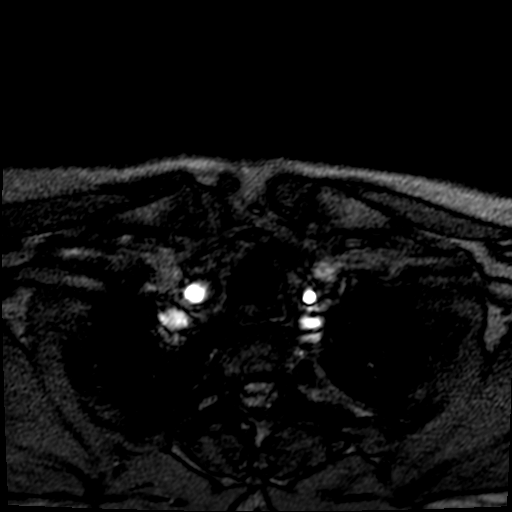
[im 12/60]
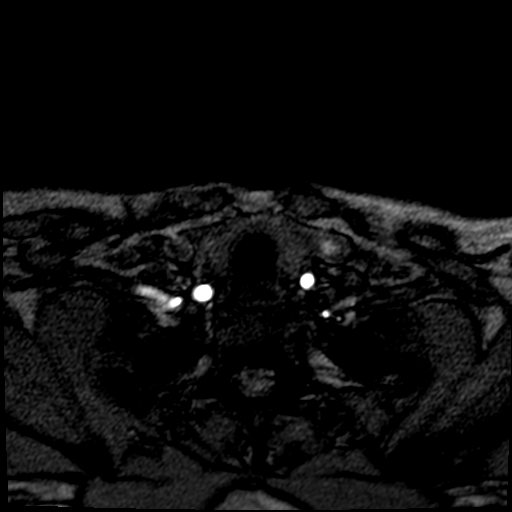
[im 18/60]
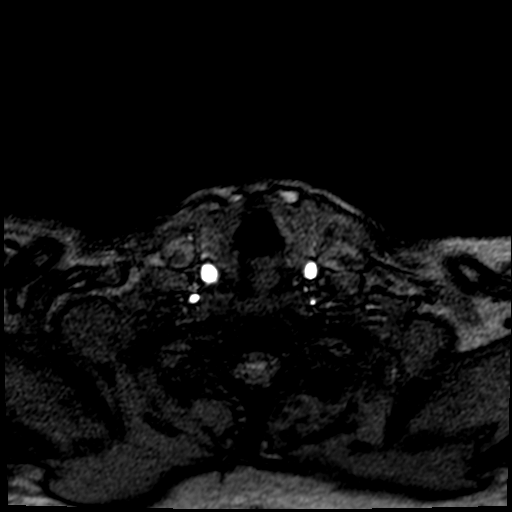
[im 24/60]
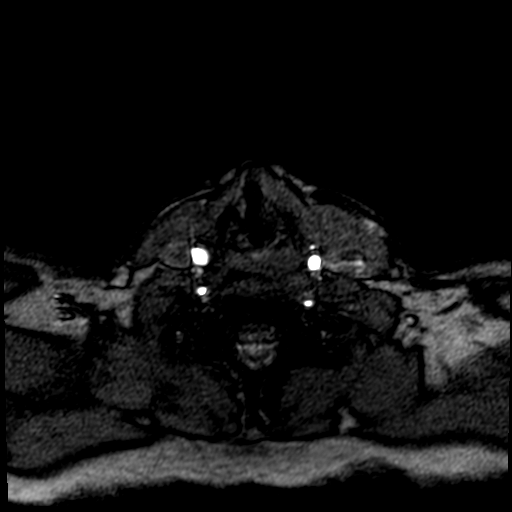
[im 30/60]
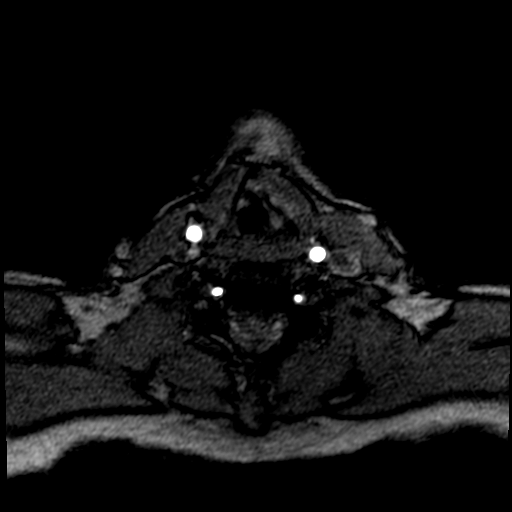
[im 36/60]
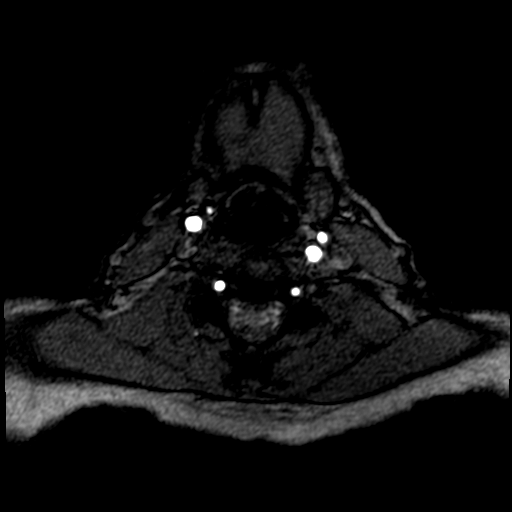
[im 42/60]
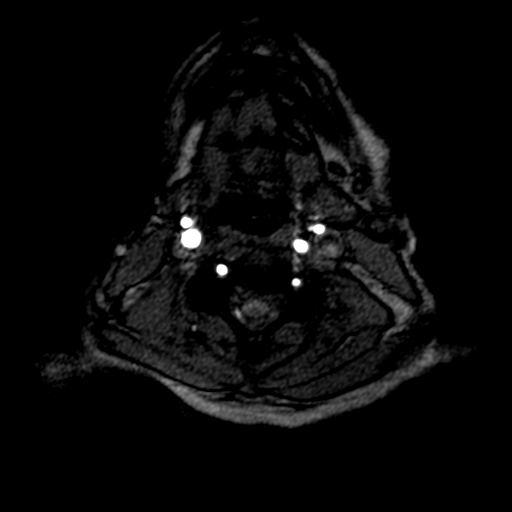
[im 54/60]
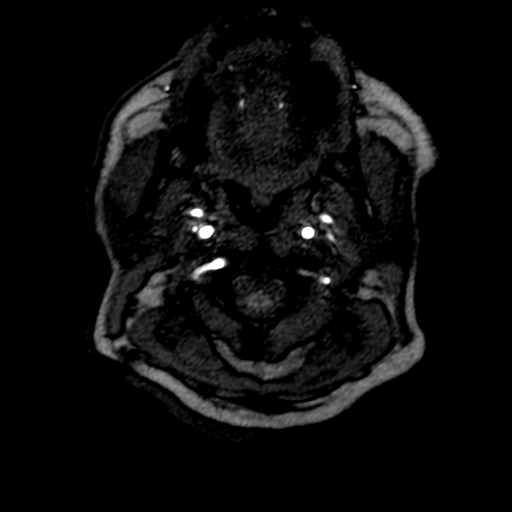

[Series 6: tof_2d_tra_mip_tra · axial · 148.1mm · 0.43mm/px · 1 of 1 slices shown]
[im 1/1]
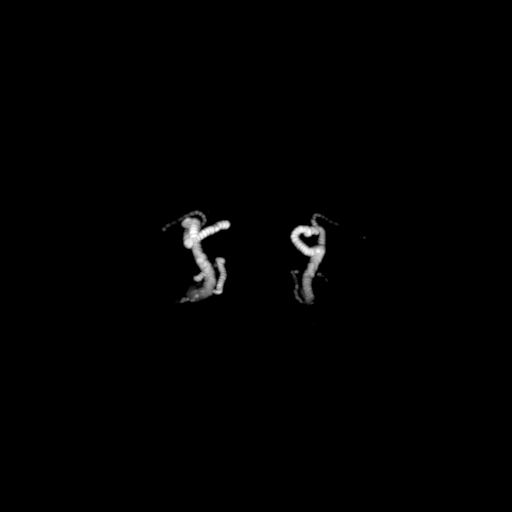

[Series 7: tof_3d_multi-slab-bifurcation · axial · 1.0mm · 0.26mm/px · z∈[-172,-40]mm · 3 of 188 slices shown]
[im 28/188]
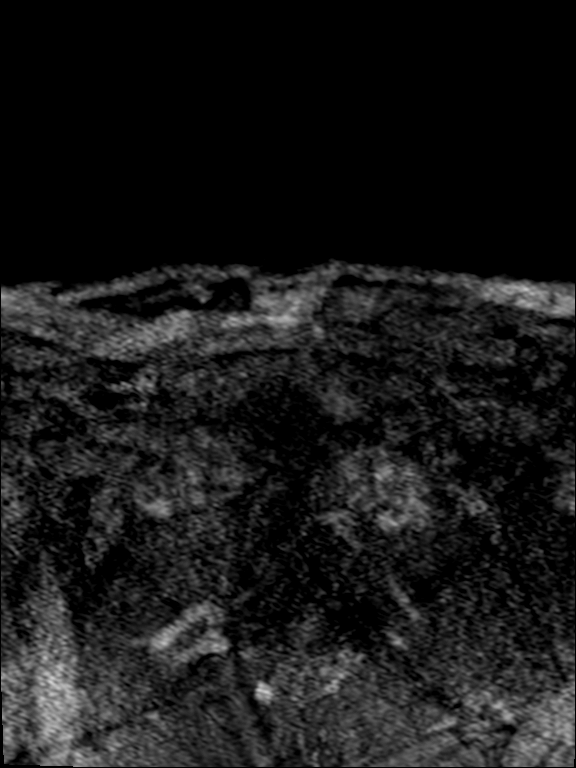
[im 94/188]
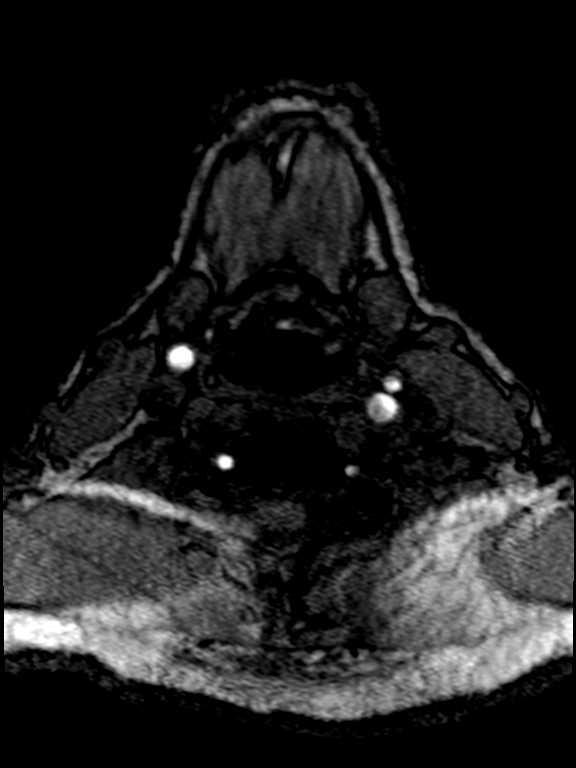
[im 160/188]
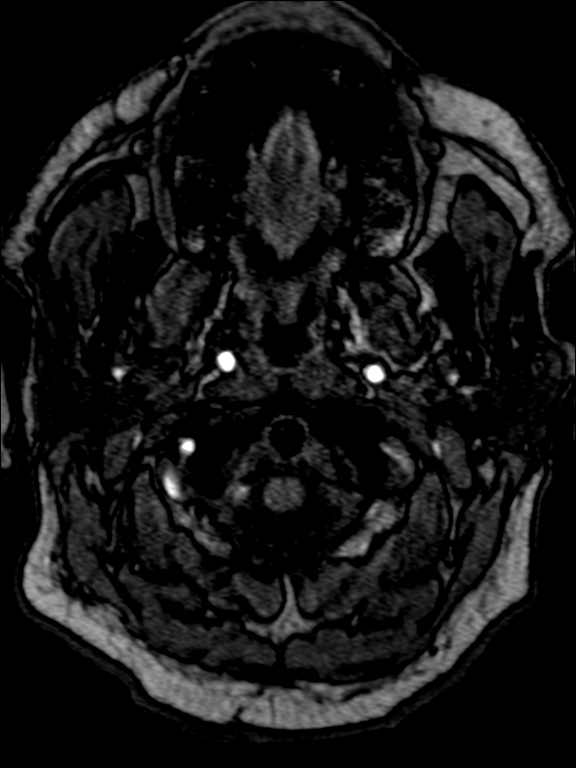

[13 of 48 positions shown; findings below may reference images not displayed]

FINDINGS: MRI HEAD FINDINGS

Brain: Cerebral volume within normal limits for age. Patchy T2/FLAIR
hyperintensity within the periventricular and deep white matter both
cerebral hemispheres most consistent with chronic small vessel
ischemic disease, mild in nature. Superimposed remote lacunar
infarct present at the right paramedian pons (series 8, image 8).

Focal area of mild diffusion abnormality seen involving the high
left parietal cortex (series 5, image 92). Associated ADC signal has
largely resolved, with evidence for early encephalomalacia. Finding
consistent with a subacute to chronic posterior left MCA territory
infarct. No associated hemorrhage or mass effect. No other evidence
for acute or subacute ischemia. Gray-white matter differentiation
otherwise maintained. No other encephalomalacia to suggest chronic
cortical infarction. No foci of susceptibility artifact to suggest
acute or chronic intracranial hemorrhage.

No mass lesion, midline shift or mass effect. No hydrocephalus or
extra-axial fluid collection. Pituitary gland suprasellar region
normal. Midline structures intact.

Vascular: Absent flow void within the left vertebral artery,
suggesting slow flow and/or occlusion. Major intracranial vascular
flow voids otherwise maintained.

Skull and upper cervical spine: Craniocervical junction within
normal limits. Upper cervical spine normal. Bone marrow signal
intensity within normal limits. No scalp soft tissue abnormality.

Sinuses/Orbits: Globes and orbital soft tissues within normal
limits. Mild scattered mucosal thickening noted throughout the
paranasal sinuses. Trace right mastoid effusion noted, of doubtful
significance. Inner ear structures within normal limits.

Other: None.

MRA HEAD FINDINGS

ANTERIOR CIRCULATION:

Visualized distal cervical segments of the internal carotid arteries
are widely patent with symmetric antegrade flow. Petrous segments
widely patent. Mild atheromatous irregularity seen throughout the
cavernous/supraclinoid ICAs without high-grade stenosis or other
abnormality. ICA termini well perfused. A1 segments patent
bilaterally. Normal anterior communicating artery complex. Anterior
cerebral arteries widely patent to their distal aspects without
stenosis. No M1 stenosis or occlusion. Normal MCA bifurcations.
Distal MCA branches well perfused and symmetric.

POSTERIOR CIRCULATION:

Right vertebral artery appears slightly dominant. Mild atheromatous
irregularity within the right V4 segment without high-grade
stenosis. Right PICA patent. Left vertebral artery occluded at the
skull base, with occlusion of the majority of the left V4 segment.
Some flow related signal seen within the distal left V4 segment,
likely via collateralization. Left PICA not visualized. Dominant
left AICA. Basilar demonstrates mild atheromatous irregularity but
is widely patent to its distal aspect without stenosis. Superior
cerebral arteries patent bilaterally. Left PCA supplied via the
basilar. Fetal type origin of the right PCA supplied via a robust
right posterior communicating artery. Both PCAs widely patent to
their distal aspects without stenosis.

No intracranial aneurysm.

MRA NECK FINDINGS

AORTIC ARCH: Examination mildly limited by lack of IV contrast.

Aortic arch not visualized on this exam. Probable bovine branch
pattern with common origin of the right brachiocephalic and left
common carotid artery. No flow-limiting stenosis seen about the
origin of the great vessels. Partially visualized subclavian
arteries widely patent.

RIGHT CAROTID SYSTEM: Visualized right common carotid artery patent
to the bifurcation without stenosis. Mild atheromatous irregularity
about the right bifurcation without hemodynamically significant
stenosis. Right ICA widely patent distally without stenosis or
occlusion.

LEFT CAROTID SYSTEM: Visualized left CCA widely patent to the
bifurcation without stenosis. Mild atheromatous irregularity about
the left bifurcation with no more than mild stenosis. Left ICA
widely patent distally without stenosis or occlusion.

VERTEBRAL ARTERIES: Both vertebral arteries appear to arise from the
subclavian arteries. Right vertebral artery dominant, with a
diffusely diminutive left vertebral artery. Vertebral arteries
patent within the neck, although flow within the slightly diminutive
left vertebral artery is diffusely attenuated. Left vertebral artery
again seen to occluded at the skull base.
IMPRESSION: MRI HEAD IMPRESSION:

1. Subacute to chronic posterior left MCA territory infarct
involving the left parietal cortex as above. No associated
hemorrhage or mass effect.
2. Underlying age-related cerebral atrophy with mild chronic small
vessel ischemic disease, with additional remote lacunar infarct
involving the right paramedian pons.

MRA HEAD IMPRESSION:

1. Mild atheromatous irregularity involving the carotid siphons
without high-grade stenosis. No large vessel occlusion or
hemodynamically significant stenosis about the anterior circulation.
2. Occlusion of the left vertebral artery at the skull base.
Dominant right vertebral artery remains widely patent as does the
remainder of the posterior circulation.
3. Fetal type origin of the right PCA.
4. No intracranial aneurysm.

MRA NECK IMPRESSION:

1. Mild atheromatous irregularity about the carotid bifurcations
without hemodynamically significant stenosis. Both carotid artery
systems otherwise widely patent within the neck.
2. Wide patency of the vertebral arteries within the neck, although
the left vertebral occludes at the skull base. Right vertebral is
dominant.

## 2019-08-15 IMAGING — MR MR MRA NECK W/O CM
11 of 12 series · 39 of 48 positions shown · non-contrast
Comparison: None available.

CLINICAL DATA: Initial evaluation for history of stroke. Right arm
weakness and numbness.



[Series 6: DWI · axial · 3.0mm · 1.36mm/px · z∈[-53,+99]mm · 6 of 104 slices shown (1 of 4)]
[im 1/104]
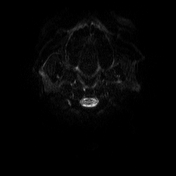
[im 21/104]
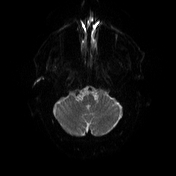
[im 42/104]
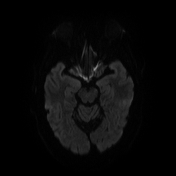
[im 62/104]
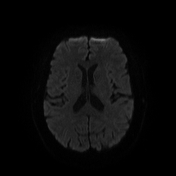
[im 83/104]
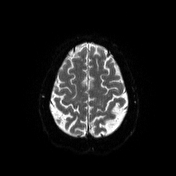
[im 104/104]
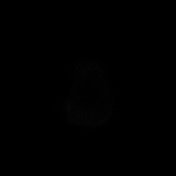

[Series 7: DWI · axial · 3.0mm · 1.36mm/px · z∈[-53,+99]mm · 3 of 52 slices shown (2 of 4)]
[im 1/52]
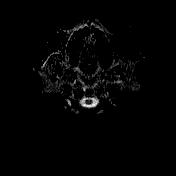
[im 26/52]
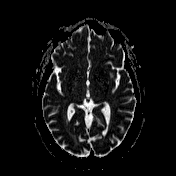
[im 52/52]
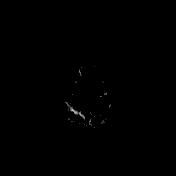

[Series 8: T1 · sagittal · 5.0mm · 0.75mm/px · 1 of 24 slices shown (1 of 2)]
[im 1/24]
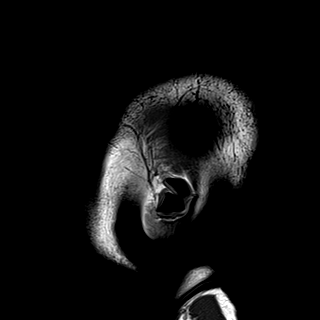

[Series 9: T2 · axial · 5.0mm · 0.62mm/px · z∈[-59,+103]mm · 2 of 26 slices shown (1 of 2)]
[im 1/26]
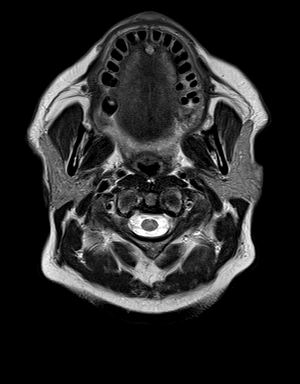
[im 26/26]
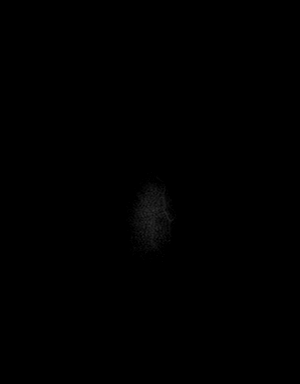

[Series 10: mip_images(sw) · axial · 24.0mm · 0.75mm/px · z∈[-50,+94]mm · 3 of 49 slices shown]
[im 1/49]
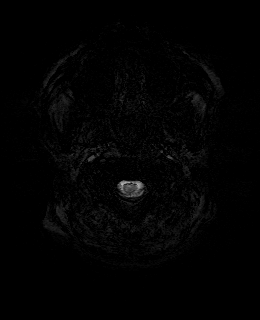
[im 25/49]
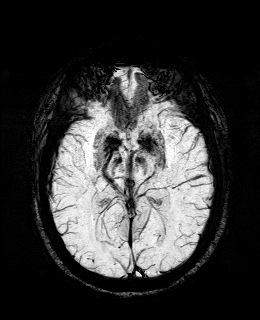
[im 49/49]
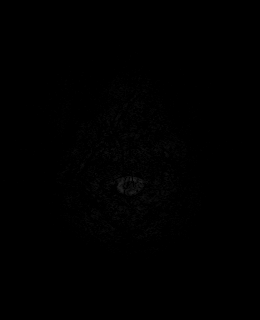

[Series 11: swi_images · axial · 3.0mm · 0.75mm/px · z∈[-60,+104]mm · 3 of 56 slices shown]
[im 1/56]
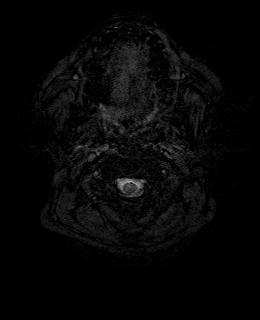
[im 28/56]
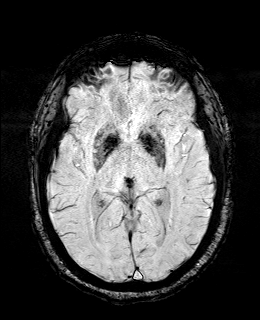
[im 56/56]
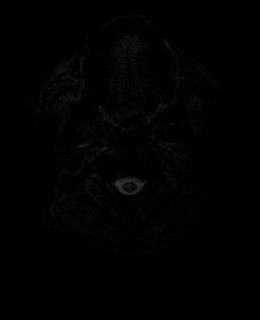

[Series 12: FLAIR · axial · 3.0mm · 0.75mm/px · z∈[-54,+98]mm · 3 of 52 slices shown]
[im 1/52]
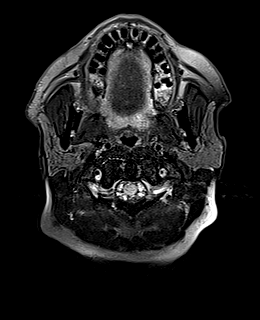
[im 26/52]
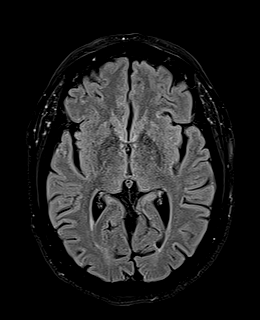
[im 52/52]
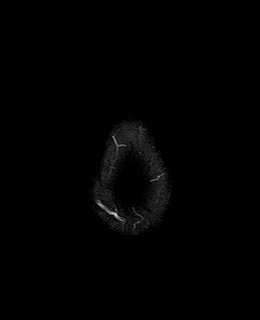

[Series 13: T1 · axial · 1.0mm · 0.94mm/px · z∈[-57,+101]mm · 10 of 160 slices shown (2 of 2)]
[im 1/160]
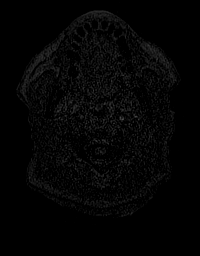
[im 18/160]
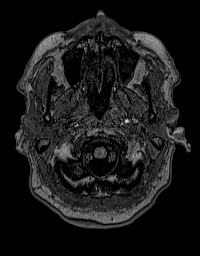
[im 36/160]
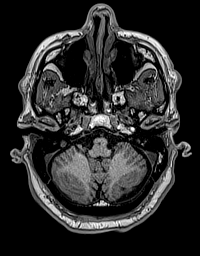
[im 54/160]
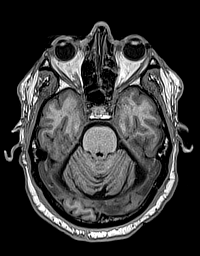
[im 71/160]
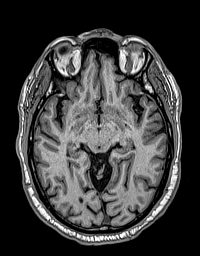
[im 89/160]
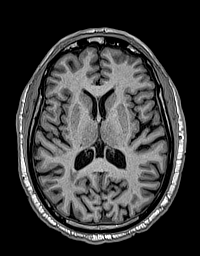
[im 107/160]
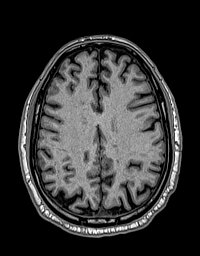
[im 124/160]
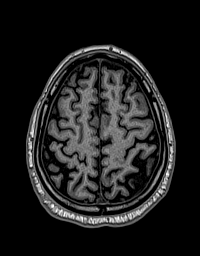
[im 142/160]
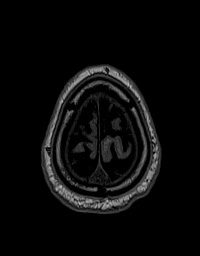
[im 160/160]
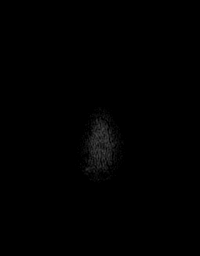

[Series 14: DWI · coronal · 5.0mm · 1.31mm/px · 4 of 72 slices shown (3 of 4)]
[im 1/72]
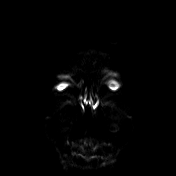
[im 24/72]
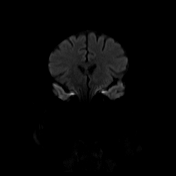
[im 48/72]
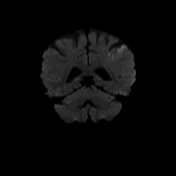
[im 72/72]
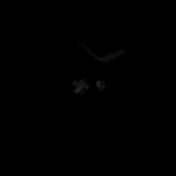

[Series 15: DWI · coronal · 5.0mm · 1.31mm/px · 2 of 36 slices shown (4 of 4)]
[im 1/36]
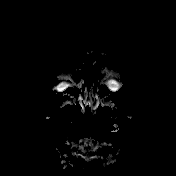
[im 36/36]
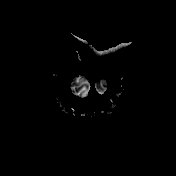

[Series 16: T2 · coronal · 5.0mm · 0.57mm/px · 2 of 35 slices shown (2 of 2)]
[im 1/35]
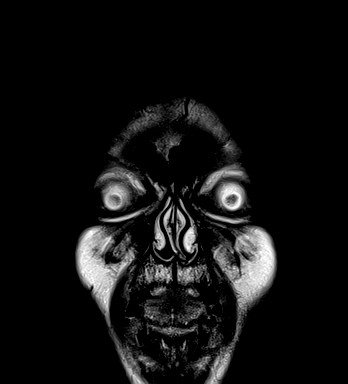
[im 35/35]
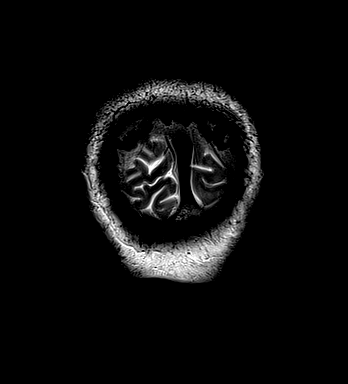

[39 of 48 positions shown; findings below may reference images not displayed]

FINDINGS: MRI HEAD FINDINGS

Brain: Cerebral volume within normal limits for age. Patchy T2/FLAIR
hyperintensity within the periventricular and deep white matter both
cerebral hemispheres most consistent with chronic small vessel
ischemic disease, mild in nature. Superimposed remote lacunar
infarct present at the right paramedian pons (series 8, image 8).

Focal area of mild diffusion abnormality seen involving the high
left parietal cortex (series 5, image 92). Associated ADC signal has
largely resolved, with evidence for early encephalomalacia. Finding
consistent with a subacute to chronic posterior left MCA territory
infarct. No associated hemorrhage or mass effect. No other evidence
for acute or subacute ischemia. Gray-white matter differentiation
otherwise maintained. No other encephalomalacia to suggest chronic
cortical infarction. No foci of susceptibility artifact to suggest
acute or chronic intracranial hemorrhage.

No mass lesion, midline shift or mass effect. No hydrocephalus or
extra-axial fluid collection. Pituitary gland suprasellar region
normal. Midline structures intact.

Vascular: Absent flow void within the left vertebral artery,
suggesting slow flow and/or occlusion. Major intracranial vascular
flow voids otherwise maintained.

Skull and upper cervical spine: Craniocervical junction within
normal limits. Upper cervical spine normal. Bone marrow signal
intensity within normal limits. No scalp soft tissue abnormality.

Sinuses/Orbits: Globes and orbital soft tissues within normal
limits. Mild scattered mucosal thickening noted throughout the
paranasal sinuses. Trace right mastoid effusion noted, of doubtful
significance. Inner ear structures within normal limits.

Other: None.

MRA HEAD FINDINGS

ANTERIOR CIRCULATION:

Visualized distal cervical segments of the internal carotid arteries
are widely patent with symmetric antegrade flow. Petrous segments
widely patent. Mild atheromatous irregularity seen throughout the
cavernous/supraclinoid ICAs without high-grade stenosis or other
abnormality. ICA termini well perfused. A1 segments patent
bilaterally. Normal anterior communicating artery complex. Anterior
cerebral arteries widely patent to their distal aspects without
stenosis. No M1 stenosis or occlusion. Normal MCA bifurcations.
Distal MCA branches well perfused and symmetric.

POSTERIOR CIRCULATION:

Right vertebral artery appears slightly dominant. Mild atheromatous
irregularity within the right V4 segment without high-grade
stenosis. Right PICA patent. Left vertebral artery occluded at the
skull base, with occlusion of the majority of the left V4 segment.
Some flow related signal seen within the distal left V4 segment,
likely via collateralization. Left PICA not visualized. Dominant
left AICA. Basilar demonstrates mild atheromatous irregularity but
is widely patent to its distal aspect without stenosis. Superior
cerebral arteries patent bilaterally. Left PCA supplied via the
basilar. Fetal type origin of the right PCA supplied via a robust
right posterior communicating artery. Both PCAs widely patent to
their distal aspects without stenosis.

No intracranial aneurysm.

MRA NECK FINDINGS

AORTIC ARCH: Examination mildly limited by lack of IV contrast.

Aortic arch not visualized on this exam. Probable bovine branch
pattern with common origin of the right brachiocephalic and left
common carotid artery. No flow-limiting stenosis seen about the
origin of the great vessels. Partially visualized subclavian
arteries widely patent.

RIGHT CAROTID SYSTEM: Visualized right common carotid artery patent
to the bifurcation without stenosis. Mild atheromatous irregularity
about the right bifurcation without hemodynamically significant
stenosis. Right ICA widely patent distally without stenosis or
occlusion.

LEFT CAROTID SYSTEM: Visualized left CCA widely patent to the
bifurcation without stenosis. Mild atheromatous irregularity about
the left bifurcation with no more than mild stenosis. Left ICA
widely patent distally without stenosis or occlusion.

VERTEBRAL ARTERIES: Both vertebral arteries appear to arise from the
subclavian arteries. Right vertebral artery dominant, with a
diffusely diminutive left vertebral artery. Vertebral arteries
patent within the neck, although flow within the slightly diminutive
left vertebral artery is diffusely attenuated. Left vertebral artery
again seen to occluded at the skull base.
IMPRESSION: MRI HEAD IMPRESSION:

1. Subacute to chronic posterior left MCA territory infarct
involving the left parietal cortex as above. No associated
hemorrhage or mass effect.
2. Underlying age-related cerebral atrophy with mild chronic small
vessel ischemic disease, with additional remote lacunar infarct
involving the right paramedian pons.

MRA HEAD IMPRESSION:

1. Mild atheromatous irregularity involving the carotid siphons
without high-grade stenosis. No large vessel occlusion or
hemodynamically significant stenosis about the anterior circulation.
2. Occlusion of the left vertebral artery at the skull base.
Dominant right vertebral artery remains widely patent as does the
remainder of the posterior circulation.
3. Fetal type origin of the right PCA.
4. No intracranial aneurysm.

MRA NECK IMPRESSION:

1. Mild atheromatous irregularity about the carotid bifurcations
without hemodynamically significant stenosis. Both carotid artery
systems otherwise widely patent within the neck.
2. Wide patency of the vertebral arteries within the neck, although
the left vertebral occludes at the skull base. Right vertebral is
dominant.

## 2019-08-15 IMAGING — MR MR HEAD W/O CM
11 series · 48 of 48 positions shown · non-contrast
Comparison: None available.

CLINICAL DATA: Initial evaluation for history of stroke. Right arm
weakness and numbness.



[Series 5: DWI · axial · 3.0mm · 1.36mm/px · z∈[-53,+99]mm · 6 of 104 slices shown (1 of 4)]
[im 1/104]
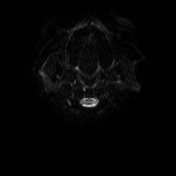
[im 21/104]
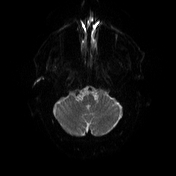
[im 42/104]
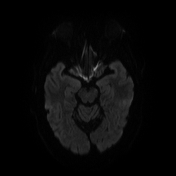
[im 62/104]
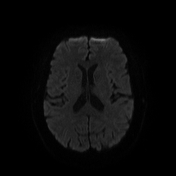
[im 83/104]
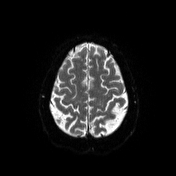
[im 104/104]
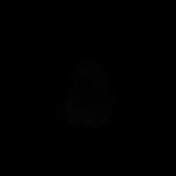

[Series 6: DWI · axial · 3.0mm · 1.36mm/px · z∈[-53,+99]mm · 3 of 52 slices shown (2 of 4)]
[im 1/52]
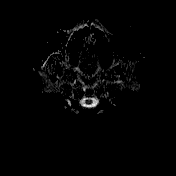
[im 26/52]
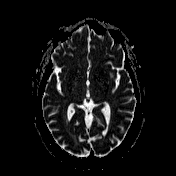
[im 52/52]
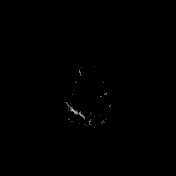

[Series 7: T1 · sagittal · 5.0mm · 0.75mm/px · 2 of 24 slices shown (1 of 2)]
[im 1/24]
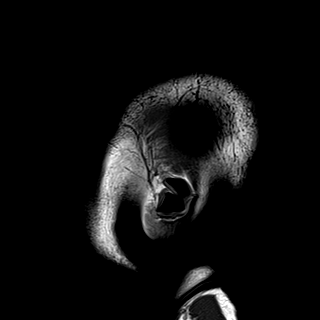
[im 24/24]
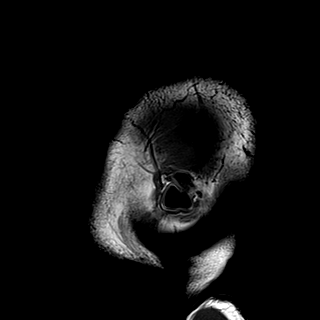

[Series 8: T2 · axial · 5.0mm · 0.62mm/px · z∈[-59,+103]mm · 2 of 26 slices shown (1 of 2)]
[im 1/26]
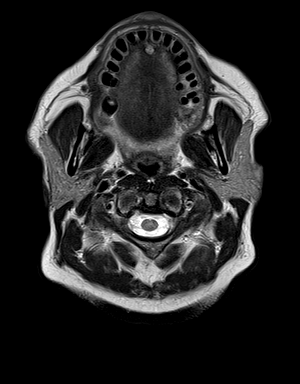
[im 26/26]
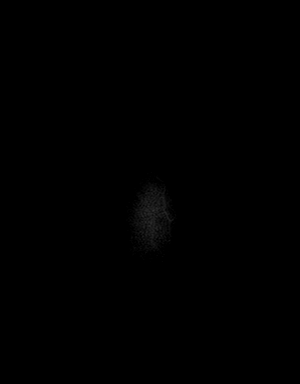

[Series 9: mip_images(sw) · axial · 24.0mm · 0.75mm/px · z∈[-50,+94]mm · 4 of 49 slices shown]
[im 1/49]
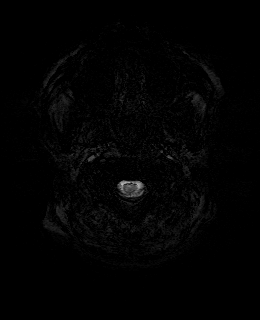
[im 17/49]
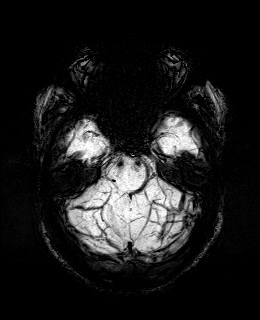
[im 33/49]
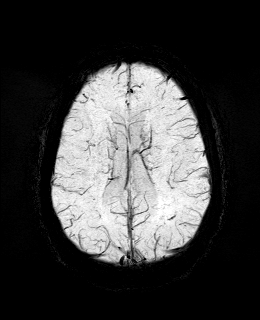
[im 49/49]
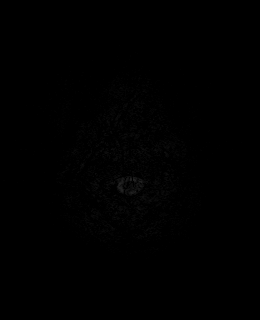

[Series 10: swi_images · axial · 3.0mm · 0.75mm/px · z∈[-60,+104]mm · 4 of 56 slices shown]
[im 1/56]
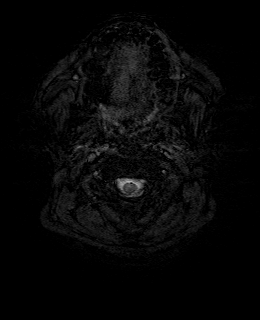
[im 19/56]
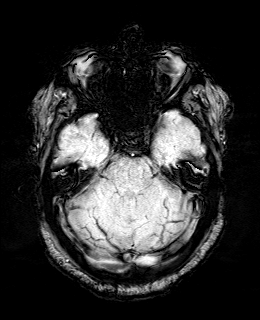
[im 37/56]
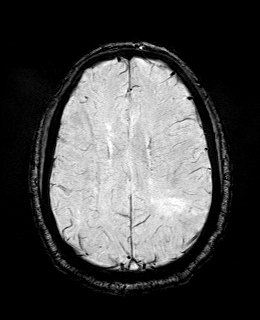
[im 56/56]
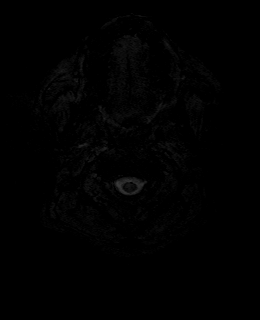

[Series 11: FLAIR · axial · 3.0mm · 0.75mm/px · z∈[-54,+98]mm · 4 of 52 slices shown]
[im 1/52]
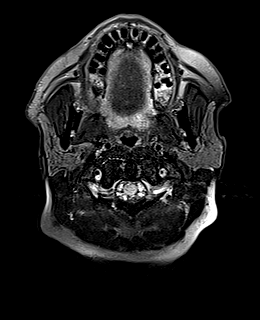
[im 18/52]
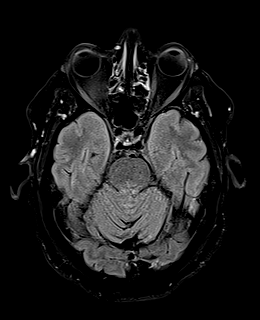
[im 35/52]
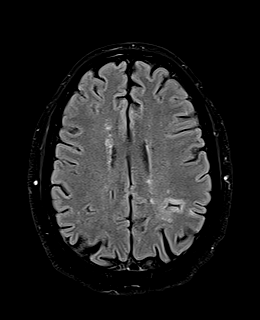
[im 52/52]
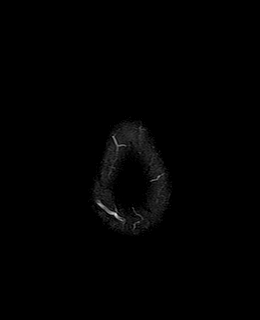

[Series 12: T1 · axial · 1.0mm · 0.94mm/px · z∈[-57,+101]mm · 12 of 160 slices shown (2 of 2)]
[im 1/160]
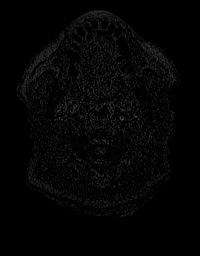
[im 15/160]
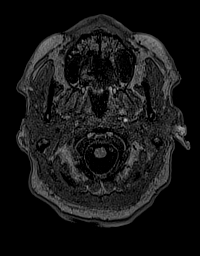
[im 29/160]
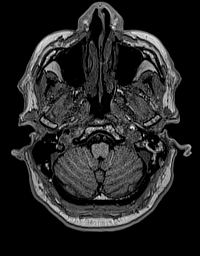
[im 44/160]
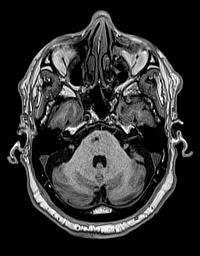
[im 58/160]
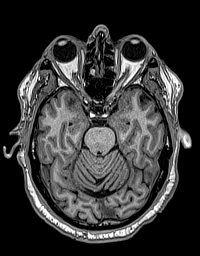
[im 73/160]
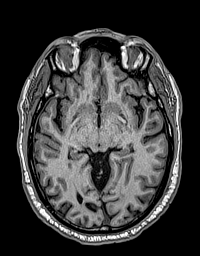
[im 87/160]
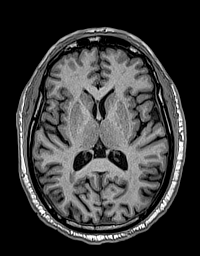
[im 102/160]
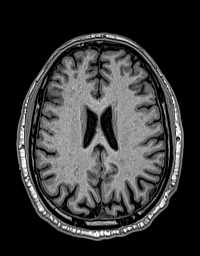
[im 116/160]
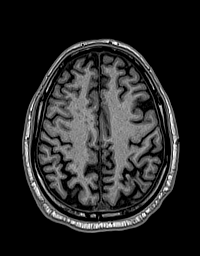
[im 131/160]
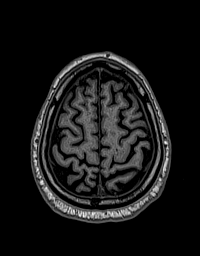
[im 145/160]
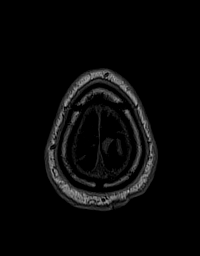
[im 160/160]
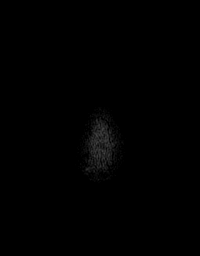

[Series 13: DWI · coronal · 5.0mm · 1.31mm/px · 5 of 72 slices shown (3 of 4)]
[im 1/72]
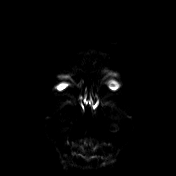
[im 18/72]
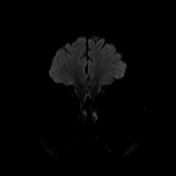
[im 36/72]
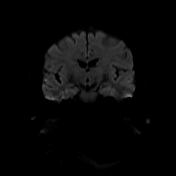
[im 54/72]
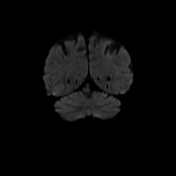
[im 72/72]
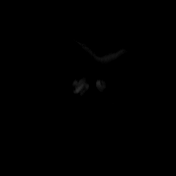

[Series 14: DWI · coronal · 5.0mm · 1.31mm/px · 3 of 36 slices shown (4 of 4)]
[im 1/36]
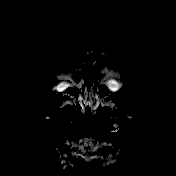
[im 18/36]
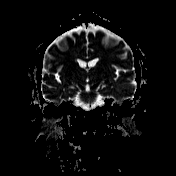
[im 36/36]
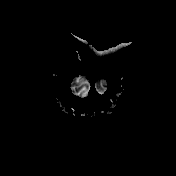

[Series 15: T2 · coronal · 5.0mm · 0.57mm/px · 3 of 35 slices shown (2 of 2)]
[im 1/35]
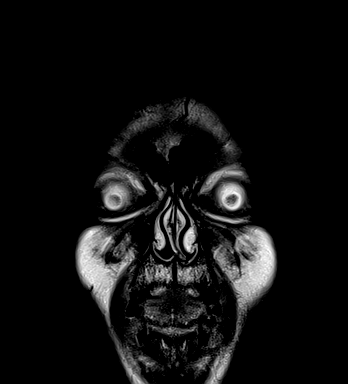
[im 18/35]
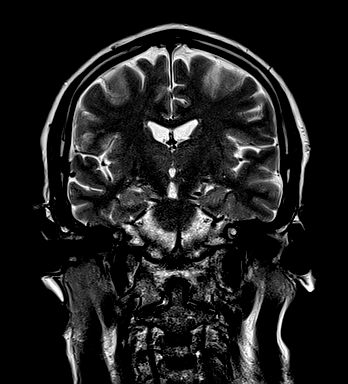
[im 35/35]
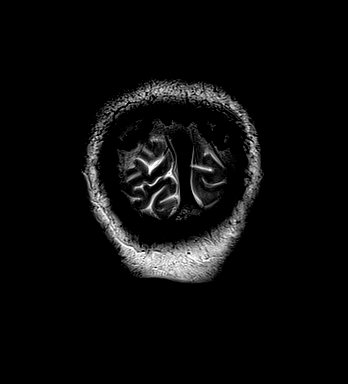

[48 of 48 positions shown; findings below may reference images not displayed]

FINDINGS: MRI HEAD FINDINGS

Brain: Cerebral volume within normal limits for age. Patchy T2/FLAIR
hyperintensity within the periventricular and deep white matter both
cerebral hemispheres most consistent with chronic small vessel
ischemic disease, mild in nature. Superimposed remote lacunar
infarct present at the right paramedian pons (series 8, image 8).

Focal area of mild diffusion abnormality seen involving the high
left parietal cortex (series 5, image 92). Associated ADC signal has
largely resolved, with evidence for early encephalomalacia. Finding
consistent with a subacute to chronic posterior left MCA territory
infarct. No associated hemorrhage or mass effect. No other evidence
for acute or subacute ischemia. Gray-white matter differentiation
otherwise maintained. No other encephalomalacia to suggest chronic
cortical infarction. No foci of susceptibility artifact to suggest
acute or chronic intracranial hemorrhage.

No mass lesion, midline shift or mass effect. No hydrocephalus or
extra-axial fluid collection. Pituitary gland suprasellar region
normal. Midline structures intact.

Vascular: Absent flow void within the left vertebral artery,
suggesting slow flow and/or occlusion. Major intracranial vascular
flow voids otherwise maintained.

Skull and upper cervical spine: Craniocervical junction within
normal limits. Upper cervical spine normal. Bone marrow signal
intensity within normal limits. No scalp soft tissue abnormality.

Sinuses/Orbits: Globes and orbital soft tissues within normal
limits. Mild scattered mucosal thickening noted throughout the
paranasal sinuses. Trace right mastoid effusion noted, of doubtful
significance. Inner ear structures within normal limits.

Other: None.

MRA HEAD FINDINGS

ANTERIOR CIRCULATION:

Visualized distal cervical segments of the internal carotid arteries
are widely patent with symmetric antegrade flow. Petrous segments
widely patent. Mild atheromatous irregularity seen throughout the
cavernous/supraclinoid ICAs without high-grade stenosis or other
abnormality. ICA termini well perfused. A1 segments patent
bilaterally. Normal anterior communicating artery complex. Anterior
cerebral arteries widely patent to their distal aspects without
stenosis. No M1 stenosis or occlusion. Normal MCA bifurcations.
Distal MCA branches well perfused and symmetric.

POSTERIOR CIRCULATION:

Right vertebral artery appears slightly dominant. Mild atheromatous
irregularity within the right V4 segment without high-grade
stenosis. Right PICA patent. Left vertebral artery occluded at the
skull base, with occlusion of the majority of the left V4 segment.
Some flow related signal seen within the distal left V4 segment,
likely via collateralization. Left PICA not visualized. Dominant
left AICA. Basilar demonstrates mild atheromatous irregularity but
is widely patent to its distal aspect without stenosis. Superior
cerebral arteries patent bilaterally. Left PCA supplied via the
basilar. Fetal type origin of the right PCA supplied via a robust
right posterior communicating artery. Both PCAs widely patent to
their distal aspects without stenosis.

No intracranial aneurysm.

MRA NECK FINDINGS

AORTIC ARCH: Examination mildly limited by lack of IV contrast.

Aortic arch not visualized on this exam. Probable bovine branch
pattern with common origin of the right brachiocephalic and left
common carotid artery. No flow-limiting stenosis seen about the
origin of the great vessels. Partially visualized subclavian
arteries widely patent.

RIGHT CAROTID SYSTEM: Visualized right common carotid artery patent
to the bifurcation without stenosis. Mild atheromatous irregularity
about the right bifurcation without hemodynamically significant
stenosis. Right ICA widely patent distally without stenosis or
occlusion.

LEFT CAROTID SYSTEM: Visualized left CCA widely patent to the
bifurcation without stenosis. Mild atheromatous irregularity about
the left bifurcation with no more than mild stenosis. Left ICA
widely patent distally without stenosis or occlusion.

VERTEBRAL ARTERIES: Both vertebral arteries appear to arise from the
subclavian arteries. Right vertebral artery dominant, with a
diffusely diminutive left vertebral artery. Vertebral arteries
patent within the neck, although flow within the slightly diminutive
left vertebral artery is diffusely attenuated. Left vertebral artery
again seen to occluded at the skull base.
IMPRESSION: MRI HEAD IMPRESSION:

1. Subacute to chronic posterior left MCA territory infarct
involving the left parietal cortex as above. No associated
hemorrhage or mass effect.
2. Underlying age-related cerebral atrophy with mild chronic small
vessel ischemic disease, with additional remote lacunar infarct
involving the right paramedian pons.

MRA HEAD IMPRESSION:

1. Mild atheromatous irregularity involving the carotid siphons
without high-grade stenosis. No large vessel occlusion or
hemodynamically significant stenosis about the anterior circulation.
2. Occlusion of the left vertebral artery at the skull base.
Dominant right vertebral artery remains widely patent as does the
remainder of the posterior circulation.
3. Fetal type origin of the right PCA.
4. No intracranial aneurysm.

MRA NECK IMPRESSION:

1. Mild atheromatous irregularity about the carotid bifurcations
without hemodynamically significant stenosis. Both carotid artery
systems otherwise widely patent within the neck.
2. Wide patency of the vertebral arteries within the neck, although
the left vertebral occludes at the skull base. Right vertebral is
dominant.

## 2019-08-22 ENCOUNTER — Telehealth: Payer: Self-pay | Admitting: Cardiology

## 2019-08-22 ENCOUNTER — Ambulatory Visit: Payer: Medicare PPO

## 2019-08-22 ENCOUNTER — Other Ambulatory Visit: Payer: Self-pay

## 2019-08-22 ENCOUNTER — Telehealth: Payer: Self-pay | Admitting: Neurology

## 2019-08-22 MED ORDER — CLOPIDOGREL BISULFATE 75 MG PO TABS
75.0000 mg | ORAL_TABLET | Freq: Every day | ORAL | 11 refills | Status: DC
Start: 2019-08-22 — End: 2019-08-23

## 2019-08-22 NOTE — Telephone Encounter (Signed)
Please see result note under MRI brain.  [name] [name] spoke with patient regarding results.

## 2019-08-22 NOTE — Telephone Encounter (Signed)
New Message  Patient is calling in for his MRI results and to go over how he should be taking his medication. Please give patient a call back to assist.

## 2019-08-22 NOTE — Progress Notes (Signed)
Order for Plavix added per DR. Tomi Likens

## 2019-08-22 NOTE — Telephone Encounter (Signed)
Patient's wife called in stating she saw her husband's MRI results were on mychart, but they didn't know how to interpret them. She would like for someone to give them a call and explain the results to them.

## 2019-08-22 NOTE — Telephone Encounter (Signed)
Spoke with the [name] [name] and he was told that his MRI showed a CVA. He is now having a bubble study 09/12/19... he was told to d/c ASA and restart Plavix.[name] neuro sent in a 30 day supply but asked him to call cardio for further refills.   [name] [name] is going to have his Lipid panel done here at this office.   Will forward to A duke PA to see if I can get order for Plavix refills.

## 2019-08-22 NOTE — Telephone Encounter (Signed)
Pt advised of MRI Results, F/U appt, and Bubble study in June. Pt also advised to stop the Asprin and continue in Plavix 75mg .

## 2019-08-23 MED ORDER — CLOPIDOGREL BISULFATE 75 MG PO TABS: 75.0000 mg | ORAL_TABLET | Freq: Every day | ORAL | 3 refills | Status: DC

## 2019-08-23 NOTE — Telephone Encounter (Signed)
Refill sent to the pharmacy electronically. Patient made aware.

## 2019-09-12 ENCOUNTER — Other Ambulatory Visit (HOSPITAL_COMMUNITY): Payer: Medicare PPO

## 2019-09-12 ENCOUNTER — Telehealth (HOSPITAL_COMMUNITY): Payer: Self-pay | Admitting: Neurology

## 2019-09-12 NOTE — Telephone Encounter (Signed)
Patients wife called and cancelled Echocardiogram (Bubble) for 09/12/19.  Patient is leaving for out of town and did not wish to reschedule. Order will be removed from the [facility] and if he decides to have at a later date we can reinstate order. Thank you.

## 2019-12-11 ENCOUNTER — Other Ambulatory Visit: Payer: Self-pay

## 2019-12-11 ENCOUNTER — Ambulatory Visit (INDEPENDENT_AMBULATORY_CARE_PROVIDER_SITE_OTHER): Payer: Medicare PPO | Admitting: Cardiovascular Disease

## 2019-12-11 ENCOUNTER — Encounter: Payer: Self-pay | Admitting: Cardiovascular Disease

## 2019-12-11 VITALS — BP 104/60 | HR 66 | Ht 65.0 in | Wt 178.0 lb

## 2019-12-11 DIAGNOSIS — E785 Hyperlipidemia, unspecified: Secondary | ICD-10-CM | POA: Diagnosis not present

## 2019-12-11 DIAGNOSIS — I739 Peripheral vascular disease, unspecified: Secondary | ICD-10-CM | POA: Diagnosis not present

## 2019-12-11 NOTE — Progress Notes (Signed)
12/11/2019 Plumer Mittelstaedt   June 09, 1945  [id number]  Primary Physician [name], MD Primary Cardiologist: Lorretta Harp MD [name], [place]  HPI:  [name] [name] is a 74 y.o.  mildly overweight married African-American male father of 2 sons, grandfather 2 grandchildren who is a retired Surveyor, quantity at [employer].  He was referred by Kerin Ransom, PA-C for peripheral vascular evaluation.    I last saw him in the office 05/07/2019.  He does have a history of treated hyperlipidemia.  He had a stroke in the 51s.  He had bypass grafting x4 at [facility] 02/13/2016 and has remained stable since.  He did see Dr. [name], his podiatrist, because of pain in his left foot and lower extremity arterial Doppler studies ordered in our office 03/19/2019 revealed a normal right ABI with a left ABI of 0.57 and a high-grade lesion in his popliteal artery with an occluded anterior tibial.  He really denies claudication.  There is no open wound suggesting critical limb ischemia.  Since I saw him 8 months ago he continues to do well.  He has no evidence of critical limb ischemia and continues to deny claudication.  In addition, he denies chest pain or shortness of breath.   Current Meds  Medication Sig  . acetaminophen (TYLENOL) 650 MG CR tablet Take 650 mg by mouth QID.  [name] aspirin EC 81 MG tablet Take 81 mg by mouth as directed.   . clopidogrel (PLAVIX) 75 MG tablet Take 1 tablet (75 mg total) by mouth daily.     No Known Allergies  Social History   Socioeconomic History  . Marital status: Married    Spouse name: Not on file  . Number of children: 2  . Years of education: Not on file  . Highest education level: Not on file  Occupational History  . Not on file  Tobacco Use  . Smoking status: Former Smoker    Quit date: 04/11/1968    Years since quitting: 51.7  . Smokeless tobacco: Never Used  . Tobacco comment: quit in the 1970s  Vaping Use  . Vaping Use: Never  used  Substance and Sexual Activity  . Alcohol use: Yes    Comment: Rare  . Drug use: No  . Sexual activity: Not on file  Other Topics Concern  . Not on file  Social History Narrative   Right handed   One story home   Drinks no caffeine   Social Determinants of Health   Financial Resource Strain:   . Difficulty of Paying Living Expenses: Not on file  Food Insecurity:   . Worried About Charity fundraiser in the Last Year: Not on file  . Ran Out of Food in the Last Year: Not on file  Transportation Needs:   . Lack of Transportation (Medical): Not on file  . Lack of Transportation (Non-Medical): Not on file  Physical Activity:   . Days of Exercise per Week: Not on file  . Minutes of Exercise per Session: Not on file  Stress:   . Feeling of Stress : Not on file  Social Connections:   . Frequency of Communication with Friends and Family: Not on file  . Frequency of Social Gatherings with Friends and Family: Not on file  . Attends Religious Services: Not on file  . Active Member of Clubs or Organizations: Not on file  . Attends Archivist Meetings: Not on file  .  Marital Status: Not on file  Intimate Partner Violence:   . Fear of Current or Ex-Partner: Not on file  . Emotionally Abused: Not on file  . Physically Abused: Not on file  . Sexually Abused: Not on file     Review of Systems: General: negative for chills, fever, night sweats or weight changes.  Cardiovascular: negative for chest pain, dyspnea on exertion, edema, orthopnea, palpitations, paroxysmal nocturnal dyspnea or shortness of breath Dermatological: negative for rash Respiratory: negative for cough or wheezing Urologic: negative for hematuria Abdominal: negative for nausea, vomiting, diarrhea, bright red blood per rectum, melena, or hematemesis Neurologic: negative for visual changes, syncope, or dizziness All other systems reviewed and are otherwise negative except as noted above.    Blood  pressure 104/60, pulse 66, height 5\' 5"  (1.651 m), weight 178 lb (80.7 kg).  General appearance: alert and no distress Neck: no adenopathy, no carotid bruit, no JVD, supple, symmetrical, trachea midline and thyroid not enlarged, symmetric, no tenderness/mass/nodules Lungs: clear to auscultation bilaterally Heart: regular rate and rhythm, S1, S2 normal, no murmur, click, rub or gallop Extremities: extremities normal, atraumatic, no cyanosis or edema Pulses: Absent left pedal pulses Skin: Skin color, texture, turgor normal. No rashes or lesions Neurologic: Alert and oriented X 3, normal strength and tone. Normal symmetric reflexes. Normal coordination and gait  EKG not performed today  ASSESSMENT AND PLAN:   PVD (peripheral vascular disease) ([place]) History of PAD with Doppler studies performed 03/19/2019 revealing a right ABI of 1.19 and a left of 0.57.  He did have a high-frequency signal in his left popliteal artery and occluded left anterior tibial.  He has no evidence of critical limb ischemia and he denies claudication.  We will continue to follow his Doppler studies.  I will see him back in 1 year for follow-up.      Lorretta Harp MD FACP,FACC,FAHA, [facility] 12/11/2019 9:51 AM

## 2019-12-11 NOTE — Patient Instructions (Signed)
Medication Instructions:  The current medical regimen is effective;  continue present plan and medications.  *If you need a refill on your cardiac medications before your next appointment, please call your pharmacy*   Lab Work: LIPID/LIVER (no lab appointment needed, come fasting- nothing to eat or drink)  If you have labs (blood work) drawn today and your tests are completely normal, you will receive your results only by: [name] MyChart Message (if you have MyChart) OR . A paper copy in the mail If you have any lab test that is abnormal or we need to change your treatment, we will call you to review the results.   Testing/Procedures: Your physician has requested that you have a lower or upper extremity arterial duplex. This test is an ultrasound of the arteries in the legs or arms. It looks at arterial blood flow in the legs and arms. Allow one hour for Lower and Upper Arterial scans. There are no restrictions or special instructions   Follow-Up: At [facility], you and your health needs are our priority.  As part of our continuing mission to provide you with exceptional heart care, we have created designated Provider Care Teams.  These Care Teams include your primary Cardiologist (physician) and Advanced Practice Providers (APPs -  Physician Assistants and Nurse Practitioners) who all work together to provide you with the care you need, when you need it.  We recommend signing up for the patient portal called "MyChart".  Sign up information is provided on this After Visit Summary.  MyChart is used to connect with patients for Virtual Visits (Telemedicine).  Patients are able to view lab/test results, encounter notes, upcoming appointments, etc.  Non-urgent messages can be sent to your provider as well.   To learn more about what you can do with MyChart, go to [email or web address].    Your next appointment:   12 month(s)  The format for your next appointment:   In Person  Provider:    Quay Burow, MD

## 2019-12-11 NOTE — Assessment & Plan Note (Signed)
History of PAD with Doppler studies performed 03/19/2019 revealing a right ABI of 1.19 and a left of 0.57.  He did have a high-frequency signal in his left popliteal artery and occluded left anterior tibial.  He has no evidence of critical limb ischemia and he denies claudication.  We will continue to follow his Doppler studies.  I will see him back in 1 year for follow-up.

## 2019-12-17 NOTE — Progress Notes (Deleted)
NEUROLOGY FOLLOW UP OFFICE NOTE  [name] [name] [id number]  HISTORY OF PRESENT ILLNESS: [name] [name] is a 74 year old right-handed black male with CAD s//p NSTEMI/CABG and history of stroke in 1980 who follows up for right arm numbness and weakness.  He is accompanied by his wife who supplements history.  UPDATE: Current medications:  ***   MRI of brain without contrast on 08/15/2019 personally reviewed showed subacute to chronic posterior left MCA infarct involving the left parietal cortex as well as age-related cerebral atrophy and mild chronic small vessel ischemic changes with remote right paramedian pontine lacunar infarct.  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  Echocardiogram was ordered but patient refused.    HISTORY: On April 25, he was washing his hands after working in the yard when he felt his right arm go numb and limp. He asked for a drink of water but couldn't hold the glass.  After 20 minutes, he went to take a nap.  When he woke up an hour later, he had feeling back and was able to move his arm but it felt sore.  No associated pain, visual disturbance, slurred speech, facial droop, chest pain, shortness of breath, palpitations or dizziness.  His wife gave him a full-strength ASA.  He stayed in bed to rest the following day.  He had been alternating between taking ASA for 4 days and then Plavix.  He followed up with cardiology on April 27 who instructed him to just take ASA.  He is prescribed Crestor 40mg  once a week due to intolerance but he isn't taking it.  He had a stroke in 1980, presenting as left sided hemiparesis.  No specific cause  was identified.  Still has slight limp.   Echocardiogram from 10/03/2016 showed EF 50-55%  PAST MEDICAL HISTORY: Past Medical History:  Diagnosis Date  . Constipation   . Diabetes mellitus without complication ([place])    borderline/ pre diabetic  . NSTEMI (non-ST elevated myocardial infarction) ([place])    at [place]  . Stroke ([place])    L side - 40 years ago    MEDICATIONS: Current Outpatient Medications on File Prior to Visit  Medication Sig Dispense Refill  . acetaminophen (TYLENOL) 650 MG CR tablet Take 650 mg by mouth QID.    [name] aspirin EC 81 MG tablet Take 81 mg by mouth as directed.     . clopidogrel (PLAVIX) 75 MG tablet Take 1 tablet (75 mg total) by mouth daily. 90 tablet 3  . rosuvastatin (CRESTOR) 40 MG tablet Take 1 tablet (40 mg total) by mouth daily. (Patient not taking: Reported on 08/09/2019) 90 tablet 3   No current facility-administered medications on file prior to visit.    ALLERGIES: No Known Allergies  FAMILY HISTORY: Family History  Problem Relation Age of Onset  . Liver disease Father    ***.  SOCIAL HISTORY: Social History   Socioeconomic History  . Marital status: Married    Spouse name: Not on file  . Number of children: 2  . Years of education: Not on file  . Highest education level: Not on file  Occupational History  . Not on file  Tobacco Use  . Smoking status: Former  Smoker    Quit date: 04/11/1968    Years since quitting: 51.7  . Smokeless tobacco: Never Used  . Tobacco comment: quit in the 1970s  Vaping Use  . Vaping Use: Never used  Substance and Sexual Activity  . Alcohol use: Yes    Comment: Rare  . Drug use: No  . Sexual activity: Not on file  Other Topics Concern  . Not on file  Social History Narrative   Right handed   One story home   Drinks no caffeine   Social Determinants of Health   Financial Resource Strain:   . Difficulty of Paying Living Expenses: Not on file  Food Insecurity:   . Worried About Ship broker in the Last Year: Not on file  . Ran Out of Food in the Last Year: Not on file  Transportation Needs:   . Lack of Transportation (Medical): Not on file  . Lack of Transportation (Non-Medical): Not on file  Physical Activity:   . Days of Exercise per Week: Not on file  . Minutes of Exercise per Session: Not on file  Stress:   . Feeling of Stress : Not on file  Social Connections:   . Frequency of Communication with Friends and Family: Not on file  . Frequency of Social Gatherings with Friends and Family: Not on file  . Attends Religious Services: Not on file  . Active Member of Clubs or Organizations: Not on file  . Attends Archivist Meetings: Not on file  . Marital Status: Not on file  Intimate Partner Violence:   . Fear of Current or Ex-Partner: Not on file  . Emotionally Abused: Not on file  . Physically Abused: Not on file  . Sexually Abused: Not on file    REVIEW OF SYSTEMS: Constitutional: No fevers, chills, or sweats, no generalized fatigue, change in appetite Eyes: No visual changes, double vision, eye pain Ear, nose and throat: No hearing loss, ear pain, nasal congestion, sore throat Cardiovascular: No chest pain, palpitations Respiratory:  No shortness of breath at rest or with exertion, wheezes GastrointestinaI: No nausea, vomiting, diarrhea, abdominal pain, fecal incontinence Genitourinary:  No dysuria, urinary retention or frequency Musculoskeletal:  No neck pain, back pain Integumentary: No rash, pruritus, skin lesions Neurological: as above Psychiatric: No depression, insomnia, anxiety Endocrine: No palpitations, fatigue, diaphoresis, mood swings, change in appetite, change in weight, increased thirst Hematologic/Lymphatic:  No purpura, petechiae. Allergic/Immunologic: no itchy/runny eyes, nasal congestion, recent allergic reactions, rashes  PHYSICAL EXAM: *** General: No acute distress.  Patient appears ***-groomed.   Head:   Normocephalic/atraumatic Eyes:  Fundi examined but not visualized Neck: supple, no paraspinal tenderness, full range of motion Heart:  Regular rate and rhythm Lungs:  Clear to auscultation bilaterally Back: No paraspinal tenderness Neurological Exam: alert and oriented to person, place, and time. Attention span and concentration intact, recent and remote memory intact, fund of knowledge intact.  Speech fluent and not dysarthric, language intact.  CN II-XII intact. Bulk and tone normal, muscle strength 5/5 throughout.  Sensation to light touch, temperature and vibration intact.  Deep tendon reflexes 2+ throughout, toes downgoing.  Finger to nose and heel to shin testing intact.  Gait normal, Romberg negative.  IMPRESSION: ***  PLAN: ***  Metta Clines, DO  CC: ***

## 2019-12-18 ENCOUNTER — Ambulatory Visit: Payer: Medicare PPO | Admitting: Neurology

## 2020-01-07 ENCOUNTER — Ambulatory Visit: Payer: Medicare PPO | Admitting: Neurology

## 2020-01-08 ENCOUNTER — Encounter: Payer: Self-pay | Admitting: Podiatry

## 2020-01-08 ENCOUNTER — Other Ambulatory Visit: Payer: Self-pay

## 2020-01-08 ENCOUNTER — Ambulatory Visit (INDEPENDENT_AMBULATORY_CARE_PROVIDER_SITE_OTHER): Payer: Medicare PPO | Admitting: Podiatry

## 2020-01-08 DIAGNOSIS — M79676 Pain in unspecified toe(s): Secondary | ICD-10-CM

## 2020-01-08 DIAGNOSIS — B351 Tinea unguium: Secondary | ICD-10-CM

## 2020-01-08 DIAGNOSIS — I739 Peripheral vascular disease, unspecified: Secondary | ICD-10-CM | POA: Diagnosis not present

## 2020-01-08 NOTE — Progress Notes (Signed)
This patient returns to my office for at risk foot care.  This patient requires this care by a professional since this patient will be at risk due to having PVD.  This patient is unable to cut nails himself since the patient cannot reach his nails.These nails are painful walking and wearing shoes.  This patient presents for at risk foot care today.  General Appearance  Alert, conversant and in no acute stress.  Vascular  Dorsalis pedis and posterior tibial  pulses are palpable  Right foot.  Non palpable pulses left foot..  Capillary return is within normal limits  bilaterally. Temperature is within normal limits  bilaterally.  Neurologic  Senn-Weinstein monofilament wire test within normal limits  bilaterally. Muscle power within normal limits bilaterally.  Nails Thick disfigured discolored nails with subungual debris  from hallux to fifth toes bilaterally. No evidence of bacterial infection or drainage bilaterally.  Orthopedic  No limitations of motion  feet .  No crepitus or effusions noted.  No bony pathology or digital deformities noted.  Skin  normotropic skin with no porokeratosis noted bilaterally.  No signs of infections or ulcers noted.     Onychomycosis  Pain in right toes  Pain in left toes  Consent was obtained for treatment procedures.   Mechanical debridement of nails 1-5  bilaterally performed with a nail nipper.  Filed with dremel without incident.    Return office visit  3 months                    Told patient to return for periodic foot care and evaluation due to potential at risk complications.   Gardiner Barefoot DPM

## 2020-02-21 ENCOUNTER — Other Ambulatory Visit: Payer: Self-pay | Admitting: Cardiovascular Disease

## 2020-02-21 DIAGNOSIS — I739 Peripheral vascular disease, unspecified: Secondary | ICD-10-CM

## 2020-03-24 ENCOUNTER — Other Ambulatory Visit: Payer: Self-pay

## 2020-03-24 ENCOUNTER — Ambulatory Visit (HOSPITAL_COMMUNITY)
Admission: RE | Admit: 2020-03-24 | Discharge: 2020-03-24 | Disposition: A | Discharge status: Home / Self Care | Payer: Medicare PPO | Source: Ambulatory Visit | Attending: Cardiology | Admitting: Cardiology

## 2020-03-24 DIAGNOSIS — I739 Peripheral vascular disease, unspecified: Secondary | ICD-10-CM | POA: Diagnosis present

## 2020-03-27 NOTE — Progress Notes (Deleted)
NEUROLOGY FOLLOW UP OFFICE NOTE  Sayf Kerner [id number]   Subjective:  [name] [name] is a 74 year old right-handed black male with CAD s//p NSTEMI/CABG and history of stroke in 1980 who follows up for right arm weakness.  He is accompanied by his wife who supplements history.   UPDATE: Current medications: ***  MRI of brain without contrast on 08/15/2019 personally reviewed showed subacute to chronic posterior left MCA territory infarct involving the left parietal cortex.  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  MRA of neck showed no hemodynamically significant stenosis or occlusion.  Echocardiogram with bubble study was scheduled in June but patient cancelled because patient was leaving out of town and did not want to reschedule.    HISTORY: On 08/04/2019, he was washing his hands after working in the yard when he felt his right arm go numb and limp. He asked for a drink of water but couldn't hold the glass.  After 20 minutes, he went to take a nap.  When he woke up an hour later, he had feeling back and was able to move his arm but it felt sore.  No associated pain, visual disturbance, slurred speech, facial droop, chest pain, shortness of breath, palpitations or dizziness.  His wife gave him a full-strength ASA.  He stayed in bed to rest the following day.  He had been alternating between taking ASA for 4 days and then Plavix.  He followed up with cardiology on April 27 who instructed him to just take ASA.  He is prescribed Crestor 40mg  once a week due to intolerance but he isn't taking it.  He had a stroke in 1980, presenting as left sided hemiparesis.  No specific cause was identified.  Still has slight limp.   Echocardiogram from 10/03/2016 showed EF 50-55%  PAST MEDICAL HISTORY: Past Medical History:  Diagnosis Date  . Constipation    . Diabetes mellitus without complication ([place])    borderline/ pre diabetic  . NSTEMI (non-ST elevated myocardial infarction) ([place])    at [facility]  . Stroke ([place])    L side - 40 years ago    MEDICATIONS: Current Outpatient Medications on File Prior to Visit  Medication Sig Dispense Refill  . acetaminophen (TYLENOL) 650 MG CR tablet Take 650 mg by mouth QID.    [name] aspirin EC 81 MG tablet Take 81 mg by mouth as directed.     . clopidogrel (PLAVIX) 75 MG tablet Take 1 tablet (75 mg total) by mouth daily. 90 tablet 3  . rosuvastatin (CRESTOR) 40 MG tablet Take 1 tablet (40 mg total) by mouth daily. (Patient not taking: Reported on 08/09/2019) 90 tablet 3   No current facility-administered medications on file prior to visit.    ALLERGIES: No Known Allergies  FAMILY HISTORY: Family History  Problem Relation Age of Onset  . Liver disease Father     SOCIAL HISTORY: Social History   Socioeconomic History  . Marital status: Married    Spouse name: Not on file  . Number of children: 2  . Years of education: Not on file  . Highest education level: Not on file  Occupational History  . Not on file  Tobacco Use  . Smoking status: Former Smoker    Quit date: 04/11/1968    Years since quitting: 51.9  . Smokeless tobacco: Never Used  .  Tobacco comment: quit in the 1970s  Vaping Use  . Vaping Use: Never used  Substance and Sexual Activity  . Alcohol use: Yes    Comment: Rare  . Drug use: No  . Sexual activity: Not on file  Other Topics Concern  . Not on file  Social History Narrative   Right handed   One story home   Drinks no caffeine   Social Determinants of Health   Financial Resource Strain: Not on file  Food Insecurity: Not on file  Transportation Needs: Not on file  Physical Activity: Not on file  Stress: Not on file  Social Connections: Not on file  Intimate Partner Violence: Not on file     Objective:  *** General: No acute distress.  Patient appears  well-groomed.   Head:  Normocephalic/atraumatic Eyes:  Fundi examined but not visualized Neck: supple, no paraspinal tenderness, full range of motion Heart:  Regular rate and rhythm Lungs:  Clear to auscultation bilaterally Back: No paraspinal tenderness Neurological Exam: alert and oriented to person, place, and time. Attention span and concentration intact, recent and remote memory intact, fund of knowledge intact.  Speech fluent and not dysarthric, language intact.  CN II-XII intact. Bulk and tone normal, muscle strength 5/5 throughout.  Sensation to light touch, temperature and vibration intact.  Deep tendon reflexes 2+ throughout, toes downgoing.  Finger to nose and heel to shin testing intact.  Gait normal, Romberg negative.   Assessment/Plan:   1.  Left posterior MCA cortical infarct, embolic of unknown source 2.  Hyperlipidemia 3.  CAD s/p NSTEMI and CAGB 4.  History of smoking  1.  Plavix 75mg  daily 2.  Crestor 20mg  daily.  Check LDL 3.  ***  Metta Clines, DO  CC:  Delia Chimes, MD

## 2020-03-30 ENCOUNTER — Encounter: Payer: Self-pay | Admitting: Neurology

## 2020-03-30 ENCOUNTER — Ambulatory Visit: Payer: Medicare PPO | Admitting: Neurology

## 2020-03-30 DIAGNOSIS — Z029 Encounter for administrative examinations, unspecified: Secondary | ICD-10-CM

## 2020-04-14 ENCOUNTER — Ambulatory Visit: Payer: Medicare PPO | Admitting: Podiatry

## 2020-05-05 ENCOUNTER — Encounter: Payer: Self-pay | Admitting: Podiatry

## 2020-05-05 ENCOUNTER — Other Ambulatory Visit: Payer: Self-pay

## 2020-05-05 ENCOUNTER — Ambulatory Visit: Payer: Medicare PPO | Admitting: Podiatry

## 2020-05-05 DIAGNOSIS — I739 Peripheral vascular disease, unspecified: Secondary | ICD-10-CM | POA: Diagnosis not present

## 2020-05-05 DIAGNOSIS — M79676 Pain in unspecified toe(s): Secondary | ICD-10-CM | POA: Diagnosis not present

## 2020-05-05 DIAGNOSIS — B351 Tinea unguium: Secondary | ICD-10-CM

## 2020-05-05 NOTE — Progress Notes (Signed)
He presents today chief complaint of painfully elongated toenails.  Objective: Toenails are long thick yellow dystrophic Lee mycotic painful palpation as well as debridement.  Assessment: In limb secondary to onychomycosis.  Plan: Debridement of toenails 1 through 5 bilateral.

## 2020-05-18 ENCOUNTER — Encounter: Payer: Self-pay | Admitting: Cardiology

## 2020-05-18 ENCOUNTER — Ambulatory Visit (INDEPENDENT_AMBULATORY_CARE_PROVIDER_SITE_OTHER): Payer: Medicare PPO | Admitting: Cardiology

## 2020-05-18 ENCOUNTER — Other Ambulatory Visit: Payer: Self-pay

## 2020-05-18 VITALS — BP 124/71 | HR 63 | Ht 65.0 in | Wt 180.8 lb

## 2020-05-18 DIAGNOSIS — I2510 Atherosclerotic heart disease of native coronary artery without angina pectoris: Secondary | ICD-10-CM | POA: Diagnosis not present

## 2020-05-18 DIAGNOSIS — I739 Peripheral vascular disease, unspecified: Secondary | ICD-10-CM

## 2020-05-18 DIAGNOSIS — E785 Hyperlipidemia, unspecified: Secondary | ICD-10-CM | POA: Diagnosis not present

## 2020-05-18 NOTE — Progress Notes (Signed)
HPI: FUcoronary artery disease. Patient [place] and had a non-ST elevation myocardial infarction. He ultimately underwent coronary artery bypass graft in November 2017 with a LIMA to the LAD, saphenous vein graft to the first marginal and saphenous vein graft to the distal right coronary artery. Abdominal ultrasound March 2018 showedno aneurysm.  Echocardiogram June 2018 showed ejection fraction 55% with apical hypokinesis.  Mild diastolic dysfunction and mild mitral regurgitation.    ABIs December 2021 normal on the right and moderate on the left.  Since last seen,the patient denies any dyspnea on exertion, orthopnea, PND, pedal edema, palpitations, syncope or chest pain.   Current Outpatient Medications  Medication Sig Dispense Refill  . acetaminophen (TYLENOL) 650 MG CR tablet Take 650 mg by mouth QID.    [name] aspirin EC 81 MG tablet Take 81 mg by mouth as directed.     . clopidogrel (PLAVIX) 75 MG tablet Take 1 tablet (75 mg total) by mouth daily. 90 tablet 3  . rosuvastatin (CRESTOR) 40 MG tablet Take 1 tablet (40 mg total) by mouth daily. 90 tablet 3   No current facility-administered medications for this visit.     Past Medical History:  Diagnosis Date  . Constipation   . Diabetes mellitus without complication ([place])    borderline/ pre diabetic  . NSTEMI (non-ST elevated myocardial infarction) ([place])    at [facility]  . Stroke ([place])    L side - 40 years ago    Past Surgical History:  Procedure Laterality Date  . CARDIAC CATHETERIZATION    . CORONARY ARTERY BYPASS GRAFT     at [place] History  . Marital status: Married    Spouse name: Not on file  . Number of children: 2  . Years of education: Not on file  . Highest education level: Not on file  Occupational History  . Not on file  Tobacco Use  . Smoking status: Former Smoker    Quit date: 04/11/1968    Years since quitting: 52.1  . Smokeless  tobacco: Never Used  . Tobacco comment: quit in the 1970s  Vaping Use  . Vaping Use: Never used  Substance and Sexual Activity  . Alcohol use: Yes    Comment: Rare  . Drug use: No  . Sexual activity: Not on file  Other Topics Concern  . Not on file  Social History Narrative   Right handed   One story home   Drinks no caffeine   Social Determinants of Health   Financial Resource Strain: Not on file  Food Insecurity: Not on file  Transportation Needs: Not on file  Physical Activity: Not on file  Stress: Not on file  Social Connections: Not on file  Intimate Partner Violence: Not on file    Family History  Problem Relation Age of Onset  . Liver disease Father     ROS: no fevers or chills, productive cough, hemoptysis, dysphasia, odynophagia, melena, hematochezia, dysuria, hematuria, rash, seizure activity, orthopnea, PND, pedal edema, claudication. Remaining systems are negative.  Physical Exam: Well-developed well-nourished in no acute distress.  Skin is warm and dry.  HEENT is normal.  Neck is supple.  Chest is clear to auscultation with normal expansion.  Cardiovascular exam is regular rate and rhythm.  Abdominal exam nontender or distended. No masses palpated. Extremities show no edema. neuro grossly intact  ECG-normal sinus rhythm at a rate of 63, no ST changes.  Personally  reviewed  A/P  1 coronary artery disease status post coronary artery bypass graft-patient denies chest pain.  Plan to continue medical therapy with aspirin and statin.  2 hyperlipidemia-continue statin.  Check lipids and liver.  3 peripheral vascular disease-he denies claudication.  Continue medical therapy with aspirin and statin.  Four history of CVA-continue aspirin and statin. He would like to continue Plavix.  Kirk Ruths, MD

## 2020-05-18 NOTE — Patient Instructions (Signed)

## 2020-05-26 ENCOUNTER — Encounter: Payer: Self-pay | Admitting: *Deleted

## 2020-06-22 ENCOUNTER — Encounter: Payer: Self-pay | Admitting: *Deleted

## 2020-09-08 ENCOUNTER — Telehealth: Payer: Self-pay | Admitting: Cardiology

## 2020-09-08 NOTE — Telephone Encounter (Signed)
  Thank you for the appointment.  Since my appointment is not until August and my Handicap Placard ends next month, could someone in the office complete the attached renewal form so that I can get it processed through [facility] next month?  If you need original, I can mail it.  Kind regards, [name] [name]    This was requested via a my chart message from pt.

## 2020-09-08 NOTE — Telephone Encounter (Signed)
Left message for pt to call.

## 2020-09-14 NOTE — Telephone Encounter (Signed)
Called patient left message on personal voice mail to call back. 

## 2020-09-14 NOTE — Telephone Encounter (Signed)
Patient following up.

## 2020-09-18 ENCOUNTER — Telehealth: Payer: Self-pay | Admitting: *Deleted

## 2020-09-18 NOTE — Telephone Encounter (Signed)
Spoke with pt wife, she reports the patient got his original plaque from a previous doctor prior to becoming our patient. Currently she is not aware of the patient having any problems with SOB or chest pain with exertion. Aware he does not need a plaque just because he had a heart attack and actually he would need to park far away and walk. She voiced understanding and will contact another doctor for the plaque if he is having other issues beside his heart.

## 2020-09-18 NOTE — Telephone Encounter (Signed)
Unable to reach pt or leave a message mailbox is full 

## 2020-10-04 ENCOUNTER — Other Ambulatory Visit: Payer: Self-pay | Admitting: Cardiovascular Disease

## 2020-12-08 ENCOUNTER — Ambulatory Visit: Payer: Medicare PPO | Admitting: Cardiovascular Disease

## 2021-02-18 ENCOUNTER — Ambulatory Visit: Payer: Medicare PPO | Admitting: Podiatry

## 2021-03-03 ENCOUNTER — Ambulatory Visit (INDEPENDENT_AMBULATORY_CARE_PROVIDER_SITE_OTHER): Payer: Medicare PPO | Admitting: Podiatry

## 2021-03-03 ENCOUNTER — Other Ambulatory Visit: Payer: Self-pay

## 2021-03-03 ENCOUNTER — Encounter: Payer: Self-pay | Admitting: Podiatry

## 2021-03-03 DIAGNOSIS — L603 Nail dystrophy: Secondary | ICD-10-CM

## 2021-03-03 DIAGNOSIS — Q828 Other specified congenital malformations of skin: Secondary | ICD-10-CM

## 2021-03-03 NOTE — Progress Notes (Signed)
  Subjective:  Patient ID: [name] [name], male    DOB: Jan 27, 1946,   MRN: [id number]  Chief Complaint  Patient presents with   Callouses    Left foot lateral aspect causing pain when she walks.     75 y.o. male presents for concern for callus of left foot that has been painful for about three months. Denies any treatment. States it as been difficult to walk and is worse in certain shoes . Denies any other pedal complaints. Denies n/v/f/c.   Past Medical History:  Diagnosis Date   Constipation    Diabetes mellitus without complication ([place])    borderline/ pre diabetic   NSTEMI (non-ST elevated myocardial infarction) ([place])    at [place]   Stroke ([place])    L side - 40 years ago    Objective:  Physical Exam: Vascular: DP/PT pulses 2/4 bilateral. CFT <3 seconds. Normal hair growth on digits. No edema.  Skin. No lacerations or abrasions bilateral feet. Cored hyperkeratotic lesion noted to plantar fifth metatarsal on left.   Musculoskeletal: MMT 5/5 bilateral lower extremities in DF, PF, Inversion and Eversion. Deceased ROM in DF of ankle joint.  Neurological: Sensation intact to light touch.   Assessment:   1. Porokeratosis   2. Onychodystrophy      Plan:  Patient was evaluated and treated and all questions answered. -Discussed corns and calluses with patient and treatment options.  -Hyperkeratotic tissue was debrided with chisel without incident.  -Applied salycylic acid treatment to area with dressing. Advised to remove bandaging tomorrow.  -Encouraged daily moisturizing -Discussed use of pumice stone -Advised good supportive shoes and inserts -Patient to return to office as needed or sooner if condition worsens.   Lorenda Peck, DPM

## 2021-03-12 ENCOUNTER — Other Ambulatory Visit: Payer: Self-pay

## 2021-03-12 ENCOUNTER — Ambulatory Visit: Payer: Medicare PPO | Admitting: Cardiovascular Disease

## 2021-03-12 ENCOUNTER — Encounter: Payer: Self-pay | Admitting: Cardiovascular Disease

## 2021-03-12 VITALS — BP 126/64 | HR 58 | Ht 65.0 in | Wt 177.8 lb

## 2021-03-12 DIAGNOSIS — E785 Hyperlipidemia, unspecified: Secondary | ICD-10-CM | POA: Diagnosis not present

## 2021-03-12 DIAGNOSIS — I739 Peripheral vascular disease, unspecified: Secondary | ICD-10-CM

## 2021-03-12 NOTE — Progress Notes (Signed)
03/12/2021 Jamesmichael Shadd   [birth year]  [id number]  Primary Physician [name], MD Primary Cardiologist: Lorretta Harp MD [name], [place]  HPI:  [name] [name] is a 75 y.o.   mildly overweight married African-American male father of 2 sons, grandfather 2 grandchildren who is a retired Surveyor, quantity at [employer].  He was referred by Kerin Ransom, PA-C for peripheral vascular evaluation.    I last saw him in the office 12/11/2019.  He does have a history of treated hyperlipidemia.  He had a stroke in the 56s.  He had bypass grafting x4 at [facility] 02/13/2016 and has remained stable since.  He did see Dr. [name], his podiatrist, because of pain in his left foot and lower extremity arterial Doppler studies ordered in our office 03/19/2019 revealed a normal right ABI with a left ABI of 0.57 and a high-grade lesion in his popliteal artery with an occluded anterior tibial.  He really denies claudication.  There is no open wound suggesting critical limb ischemia.   Since I saw him 8 months ago he continues to do well.  He has no evidence of critical limb ischemia and continues to deny claudication.  In addition, he denies chest pain or shortness of breath.   Current Meds  Medication Sig   acetaminophen (TYLENOL) 650 MG CR tablet Take 650 mg by mouth QID.   aspirin EC 81 MG tablet Take 81 mg by mouth as directed.    clopidogrel (PLAVIX) 75 MG tablet TAKE 1 TABLET BY MOUTH EVERY DAY   rosuvastatin (CRESTOR) 40 MG tablet Take 1 tablet (40 mg total) by mouth daily.     No Known Allergies  Social History   Socioeconomic History   Marital status: Married    Spouse name: Not on file   Number of children: 2   Years of education: Not on file   Highest education level: Not on file  Occupational History   Not on file  Tobacco Use   Smoking status: Former    Types: Cigarettes    Quit date: 04/11/1968    Years since quitting: 52.9   Smokeless tobacco: Never    Tobacco comments:    quit in the 1970s  Vaping Use   Vaping Use: Never used  Substance and Sexual Activity   Alcohol use: Yes    Comment: Rare   Drug use: No   Sexual activity: Not on file  Other Topics Concern   Not on file  Social History Narrative   Right handed   One story home   Drinks no caffeine   Social Determinants of Health   Financial Resource Strain: Not on file  Food Insecurity: Not on file  Transportation Needs: Not on file  Physical Activity: Not on file  Stress: Not on file  Social Connections: Not on file  Intimate Partner Violence: Not on file     Review of Systems: General: negative for chills, fever, night sweats or weight changes.  Cardiovascular: negative for chest pain, dyspnea on exertion, edema, orthopnea, palpitations, paroxysmal nocturnal dyspnea or shortness of breath Dermatological: negative for rash Respiratory: negative for cough or wheezing Urologic: negative for hematuria Abdominal: negative for nausea, vomiting, diarrhea, bright red blood per rectum, melena, or hematemesis Neurologic: negative for visual changes, syncope, or dizziness All other systems reviewed and are otherwise negative except as noted above.    Blood pressure 126/64, pulse (!) 58, height 5\' 5"  (1.651 m), weight  177 lb 12.8 oz (80.6 kg), SpO2 97 %.  General appearance: alert and no distress Neck: no adenopathy, no carotid bruit, no JVD, supple, symmetrical, trachea midline, and thyroid not enlarged, symmetric, no tenderness/mass/nodules Lungs: clear to auscultation bilaterally Heart: regular rate and rhythm, S1, S2 normal, no murmur, click, rub or gallop Extremities: extremities normal, atraumatic, no cyanosis or edema Pulses: 2+ and symmetric Skin: Skin color, texture, turgor normal. No rashes or lesions Neurologic: Grossly normal  EKG sinus bradycardia 58 with nonspecific ST and T wave changes.  I personally reviewed this EKG.  ASSESSMENT AND PLAN:   PVD  (peripheral vascular disease) ([place]) History of peripheral arterial disease with Dopplers performed 03/19/2019 revealing right ABI of 1.19 and a left of 0.57 with high-grade disease in his left popliteal artery although he is completely asymptomatic and denies claudication.  I will see him back as needed.     Lorretta Harp MD FACP,FACC,FAHA, [facility] 03/12/2021 11:13 AM

## 2021-03-12 NOTE — Patient Instructions (Signed)
Medication Instructions:  Your physician recommends that you continue on your current medications as directed. Please refer to the Current Medication list given to you today.  *If you need a refill on your cardiac medications before your next appointment, please call your pharmacy*   Lab Work: Your physician recommends that you return for lab work in: next week or so for FASTING lipid/liver profile.  If you have labs (blood work) drawn today and your tests are completely normal, you will receive your results only by: [place] (if you have MyChart) OR A paper copy in the mail If you have any lab test that is abnormal or we need to change your treatment, we will call you to review the results.   Follow-Up: At [facility], you and your health needs are our priority.  As part of our continuing mission to provide you with exceptional heart care, we have created designated Provider Care Teams.  These Care Teams include your primary Cardiologist (physician) and Advanced Practice Providers (APPs -  Physician Assistants and Nurse Practitioners) who all work together to provide you with the care you need, when you need it.  We recommend signing up for the patient portal called "MyChart".  Sign up information is provided on this After Visit Summary.  MyChart is used to connect with patients for Virtual Visits (Telemedicine).  Patients are able to view lab/test results, encounter notes, upcoming appointments, etc.  Non-urgent messages can be sent to your provider as well.   To learn more about what you can do with MyChart, go to [email or web address].    Your next appointment:   We will see you on an as needed basis.   Provider:   Quay Burow, MD

## 2021-03-12 NOTE — Assessment & Plan Note (Signed)
History of peripheral arterial disease with Dopplers performed 03/19/2019 revealing right ABI of 1.19 and a left of 0.57 with high-grade disease in his left popliteal artery although he is completely asymptomatic and denies claudication.  I will see him back as needed.

## 2021-03-25 ENCOUNTER — Ambulatory Visit: Payer: Medicare PPO | Admitting: Podiatry

## 2021-04-09 ENCOUNTER — Telehealth: Payer: Self-pay

## 2021-04-09 NOTE — Telephone Encounter (Signed)
Voicemail full - tried to reach  patient to schedule appt from email request

## 2021-04-20 ENCOUNTER — Other Ambulatory Visit: Payer: Self-pay

## 2021-04-20 ENCOUNTER — Ambulatory Visit: Payer: Medicare PPO | Admitting: Podiatry

## 2021-04-20 DIAGNOSIS — L600 Ingrowing nail: Secondary | ICD-10-CM | POA: Diagnosis not present

## 2021-04-20 DIAGNOSIS — M79676 Pain in unspecified toe(s): Secondary | ICD-10-CM | POA: Diagnosis not present

## 2021-04-20 DIAGNOSIS — D2372 Other benign neoplasm of skin of left lower limb, including hip: Secondary | ICD-10-CM

## 2021-04-20 DIAGNOSIS — D2371 Other benign neoplasm of skin of right lower limb, including hip: Secondary | ICD-10-CM

## 2021-04-20 DIAGNOSIS — B351 Tinea unguium: Secondary | ICD-10-CM | POA: Diagnosis not present

## 2021-04-20 MED ORDER — NEOMYCIN-POLYMYXIN-HC 1 % OT SOLN: OTIC | 1 refills | Status: DC

## 2021-04-20 NOTE — Patient Instructions (Signed)

## 2021-04-20 NOTE — Progress Notes (Signed)
She presents today chief complaint of painful hallux nails ingrown borders.  Also painful calluses and elongated toenails.  Objective: Vital signs are stable he is alert oriented x3 pulses are palpable 2/4 right 1/4 left capillary fill time is immediate bilateral.  Toenails are long thick yellow dystrophic clinically mycotic sharp incurvated nail margin to the tibial borders the hallux bilateral exquisitely tender.  The remainder of the toenails are thick and dystrophic as well.  He has multiple reactive hyperkeratotic lesions.  Assessment: Pain limb secondary to onychomycosis ingrown toenails and benign skin lesions.  Plan: Debrided benign skin lesions debrided toenails 2 through 5 bilaterally.  And performed a chemical matricectomy to the medial border of the hallux bilateral after local anesthetic was administered he tolerated procedure well without complications.  He was provided both oral and written home-going instruction for the care and soaking of the toe and I will follow-up with him in 2 weeks to make sure he is healing well.

## 2021-05-04 ENCOUNTER — Encounter: Payer: Self-pay | Admitting: Podiatry

## 2021-05-04 ENCOUNTER — Ambulatory Visit: Payer: Medicare PPO | Admitting: Podiatry

## 2021-05-04 ENCOUNTER — Other Ambulatory Visit: Payer: Self-pay

## 2021-05-04 DIAGNOSIS — Z9889 Other specified postprocedural states: Secondary | ICD-10-CM

## 2021-05-04 DIAGNOSIS — L600 Ingrowing nail: Secondary | ICD-10-CM

## 2021-05-05 NOTE — Progress Notes (Signed)
He presents today for follow-up of his matrixectomy hallux bilateral tibial border.  States that he has been doing just fine no problems.  Objective: Vital signs are stable alert and oriented x3 there is no erythema edema cellulitis drainage odor scab has formed and appear to be healing well.  Assessment: Well-healing surgical toe hallux bilateral.  Plan: Encouraged him to continue to soak for about another week or so once a day just to make sure that it is well Epson salts and warm water follow-up with me with any regression or concerns.

## 2021-06-15 ENCOUNTER — Telehealth: Payer: Self-pay | Admitting: Cardiovascular Disease

## 2021-06-15 NOTE — Telephone Encounter (Signed)
Spoke to patient. Patient is actually outside  doing yard work. Patient states about 2 weeks ago he had an episode at night - left side of the chest around his heart randomly discomfort . It only occurred  that night .   ? ?Patient states he took 1/2 of Tums and it relieved  the discomfort. Patient states  he has not had  the symptoms since.  ?Patient states he thinks is something he ate. He states he did eat later than usually. He also states he ate something he usually does not eat and was traveling. ? ?Patient wanted to know if Tums was okay to take or does he need something else. RN informed patient he could use Tums  or Protonix ( per [facility] Pharmacist)  on an as needed basis. ? Recommend to contact primary doctor if symptoms  persist and continue to  monitor.  Aware will forward to Dr Stanford Breed for an further instruction if needed. ? Patient voiced understanding.  Patient and wife would like a mychart message sent so they can see the name of the medication  patient may use. ? ?

## 2021-06-15 NOTE — Telephone Encounter (Signed)
?  Per MyChart scheduling request message:  ? ?Pt c/o of Chest Pain: STAT if CP now or developed within 24 hours ? ?1. Are you having CP right now?  ? ?2. Are you experiencing any other symptoms (ex. SOB, nausea, vomiting, sweating)?  ? ?3. How long have you been experiencing CP?  ? ?4. Is your CP continuous or coming and going?  ? ?5. Have you taken Nitroglycerin?  ??  ? ?It is not constant, just intermittent mostly at night ?No nausea ?I did take a few Tums ?I thought it may be indigestion, but I thought that before so I was concerned ?

## 2021-07-01 ENCOUNTER — Encounter: Payer: Self-pay | Admitting: Cardiovascular Disease

## 2021-07-01 ENCOUNTER — Other Ambulatory Visit: Payer: Self-pay

## 2021-07-01 MED ORDER — CLOPIDOGREL BISULFATE 75 MG PO TABS: 75.0000 mg | ORAL_TABLET | Freq: Every day | ORAL | 3 refills | Status: DC

## 2021-07-15 ENCOUNTER — Telehealth: Payer: Self-pay | Admitting: Cardiovascular Disease

## 2021-07-15 NOTE — Telephone Encounter (Signed)
Spoke with pt and his wife they start there is not an acute issue to be addressed today. "We just want to keep a check on things." Will get message to Dr. Kennon Holter scheduler to assist with setting this pt up with an appointment.  ?

## 2021-07-15 NOTE — Telephone Encounter (Signed)
?  Per MyChart scheduling message: ? ?Need another scan of heart arteries. Having same symptoms as prior to last heart attack, loss of weight, indigestion (or what I thought was indigestion), tiredness, low blood flow. ?

## 2021-09-01 ENCOUNTER — Encounter: Payer: Self-pay | Admitting: Cardiovascular Disease

## 2021-09-01 ENCOUNTER — Ambulatory Visit: Payer: Medicare PPO | Admitting: Cardiovascular Disease

## 2021-09-01 VITALS — BP 118/68 | HR 53 | Ht 65.0 in | Wt 169.8 lb

## 2021-09-01 DIAGNOSIS — I2510 Atherosclerotic heart disease of native coronary artery without angina pectoris: Secondary | ICD-10-CM

## 2021-09-01 DIAGNOSIS — I739 Peripheral vascular disease, unspecified: Secondary | ICD-10-CM | POA: Diagnosis not present

## 2021-09-01 DIAGNOSIS — E785 Hyperlipidemia, unspecified: Secondary | ICD-10-CM

## 2021-09-01 NOTE — Progress Notes (Signed)
09/01/2021 [name] [name]   04-21-1945  [id number]  Primary Physician [name], MD Primary Cardiologist: Lorretta Harp MD [name], [place]  HPI:  [name] [name] is a 76 y.o.  mildly overweight married African-American male father of 2 sons, grandfather 2 grandchildren who is a retired Surveyor, quantity at [employer].  He was referred by Kerin Ransom, PA-C for peripheral vascular evaluation.    I last saw him in the office 03/12/2021.  He is accompanied by his wife [name] [name] today.  He does have a history of treated hyperlipidemia.  He had a stroke in the 21s.  He had  grafting x4 at [facility] 02/13/2016 and has remained stable since.  He did see Dr. [name], his podiatrist, because of pain in his left foot and lower extremity arterial Doppler studies ordered in our office 03/19/2019 revealed a normal right ABI with a left ABI of 0.57 and a high-grade lesion in his popliteal artery with an occluded anterior tibial.  He really denies claudication.  There is no open wound suggesting critical limb ischemia.   Since I saw him 8 months ago he continues to do well.  He has no evidence of critical limb ischemia and continues to deny claudication.  In addition, he denies chest pain or shortness of breath.   Current Meds  Medication Sig   acetaminophen (TYLENOL) 650 MG CR tablet Take 650 mg by mouth QID.   aspirin EC 81 MG tablet Take 81 mg by mouth as directed.    clopidogrel (PLAVIX) 75 MG tablet Take 1 tablet (75 mg total) by mouth daily.     No Known Allergies  Social History   Socioeconomic History   Marital status: Married    Spouse name: Not on file   Number of children: 2   Years of education: Not on file   Highest education level: Not on file  Occupational History   Not on file  Tobacco Use   Smoking status: Former    Types: Cigarettes    Quit date: 04/11/1968    Years since quitting: 53.4   Smokeless tobacco: Never   Tobacco comments:    quit in the  1970s  Vaping Use   Vaping Use: Never used  Substance and Sexual Activity   Alcohol use: Yes    Comment: Rare   Drug use: No   Sexual activity: Not on file  Other Topics Concern   Not on file  Social History Narrative   Right handed   One story home   Drinks no caffeine   Social Determinants of Health   Financial Resource Strain: Not on file  Food Insecurity: Not on file  Transportation Needs: Not on file  Physical Activity: Not on file  Stress: Not on file  Social Connections: Not on file  Intimate Partner Violence: Not on file     Review of Systems: General: negative for chills, fever, night sweats or weight changes.  Cardiovascular: negative for chest pain, dyspnea on exertion, edema, orthopnea, palpitations, paroxysmal nocturnal dyspnea or shortness of breath Dermatological: negative for rash Respiratory: negative for cough or wheezing Urologic: negative for hematuria Abdominal: negative for nausea, vomiting, diarrhea, bright red blood per rectum, melena, or hematemesis Neurologic: negative for visual changes, syncope, or dizziness All other systems reviewed and are otherwise negative except as noted above.    Blood pressure 118/68, pulse (!) 53, height '5\' 5"'$  (1.651 m), weight 169 lb 12.8 oz (77 kg),  SpO2 97 %.  General appearance: alert and no distress Neck: no adenopathy, no carotid bruit, no JVD, supple, symmetrical, trachea midline, and thyroid not enlarged, symmetric, no tenderness/mass/nodules Lungs: clear to auscultation bilaterally Heart: regular rate and rhythm, S1, S2 normal, no murmur, click, rub or gallop Extremities: extremities normal, atraumatic, no cyanosis or edema Pulses: Diminished pedal pulses on the left Skin: Skin color, texture, turgor normal. No rashes or lesions Neurologic: Grossly normal  EKG sinus bradycardia 53 with nonspecific ST and T wave changes.  I personally reviewed this EKG.  ASSESSMENT AND PLAN:   PVD (peripheral vascular  disease) ([place]) History of peripheral arterial disease with Dopplers performed 03/19/2019 revealing right ABI of 1.19 and a left of 0.57.  He does have a high-frequency signal in his left popliteal artery but he is completely asymptomatic.  No evidence of critical ischemia.     Lorretta Harp MD FACP,FACC,FAHA, [facility] 09/01/2021 11:29 AM

## 2021-09-01 NOTE — Patient Instructions (Signed)
Medication Instructions:  Your physician recommends that you continue on your current medications as directed. Please refer to the Current Medication list given to you today.  *If you need a refill on your cardiac medications before your next appointment, please call your pharmacy*   Lab Work: Your physician recommends that you have labs drawn today: Lipid/liver profile  If you have labs (blood work) drawn today and your tests are completely normal, you will receive your results only by: [place] (if you have MyChart) OR A paper copy in the mail If you have any lab test that is abnormal or we need to change your treatment, we will call you to review the results.   Follow-Up: At [facility], you and your health needs are our priority.  As part of our continuing mission to provide you with exceptional heart care, we have created designated Provider Care Teams.  These Care Teams include your primary Cardiologist (physician) and Advanced Practice Providers (APPs -  Physician Assistants and Nurse Practitioners) who all work together to provide you with the care you need, when you need it.  We recommend signing up for the patient portal called "MyChart".  Sign up information is provided on this After Visit Summary.  MyChart is used to connect with patients for Virtual Visits (Telemedicine).  Patients are able to view lab/test results, encounter notes, upcoming appointments, etc.  Non-urgent messages can be sent to your provider as well.   To learn more about what you can do with MyChart, go to [email or web address].    Your next appointment:   We will see you on an as needed basis.  Provider:   Quay Burow, MD

## 2021-09-01 NOTE — Assessment & Plan Note (Signed)
History of peripheral arterial disease with Dopplers performed 03/19/2019 revealing right ABI of 1.19 and a left of 0.57.  He does have a high-frequency signal in his left popliteal artery but he is completely asymptomatic.  No evidence of critical ischemia.

## 2021-10-06 ENCOUNTER — Emergency Department (HOSPITAL_COMMUNITY): Payer: Medicare PPO

## 2021-10-06 ENCOUNTER — Encounter (HOSPITAL_COMMUNITY): Payer: Self-pay

## 2021-10-06 ENCOUNTER — Encounter (HOSPITAL_COMMUNITY): Payer: Self-pay | Admitting: Pharmacy Technician

## 2021-10-06 ENCOUNTER — Ambulatory Visit (HOSPITAL_COMMUNITY)
Admission: EM | Admit: 2021-10-06 | Discharge: 2021-10-06 | Disposition: A | Discharge status: Home / Self Care | Payer: Medicare PPO

## 2021-10-06 ENCOUNTER — Observation Stay (HOSPITAL_COMMUNITY)
Admission: EM | Admit: 2021-10-06 | Discharge: 2021-10-07 | Disposition: A | Discharge status: Home / Self Care | Payer: Medicare PPO | Attending: Family Medicine | Admitting: Family Medicine

## 2021-10-06 ENCOUNTER — Other Ambulatory Visit: Payer: Self-pay

## 2021-10-06 DIAGNOSIS — I6789 Other cerebrovascular disease: Secondary | ICD-10-CM | POA: Insufficient documentation

## 2021-10-06 DIAGNOSIS — R2681 Unsteadiness on feet: Secondary | ICD-10-CM | POA: Diagnosis not present

## 2021-10-06 DIAGNOSIS — I6930 Unspecified sequelae of cerebral infarction: Secondary | ICD-10-CM

## 2021-10-06 DIAGNOSIS — R29810 Facial weakness: Secondary | ICD-10-CM | POA: Diagnosis not present

## 2021-10-06 DIAGNOSIS — I358 Other nonrheumatic aortic valve disorders: Secondary | ICD-10-CM | POA: Insufficient documentation

## 2021-10-06 DIAGNOSIS — I672 Cerebral atherosclerosis: Secondary | ICD-10-CM | POA: Diagnosis not present

## 2021-10-06 DIAGNOSIS — Z7982 Long term (current) use of aspirin: Secondary | ICD-10-CM | POA: Insufficient documentation

## 2021-10-06 DIAGNOSIS — M21372 Foot drop, left foot: Secondary | ICD-10-CM | POA: Insufficient documentation

## 2021-10-06 DIAGNOSIS — G311 Senile degeneration of brain, not elsewhere classified: Secondary | ICD-10-CM | POA: Diagnosis not present

## 2021-10-06 DIAGNOSIS — Z8673 Personal history of transient ischemic attack (TIA), and cerebral infarction without residual deficits: Secondary | ICD-10-CM | POA: Insufficient documentation

## 2021-10-06 DIAGNOSIS — R2689 Other abnormalities of gait and mobility: Secondary | ICD-10-CM | POA: Diagnosis not present

## 2021-10-06 DIAGNOSIS — R9089 Other abnormal findings on diagnostic imaging of central nervous system: Secondary | ICD-10-CM | POA: Insufficient documentation

## 2021-10-06 DIAGNOSIS — I69354 Hemiplegia and hemiparesis following cerebral infarction affecting left non-dominant side: Secondary | ICD-10-CM | POA: Insufficient documentation

## 2021-10-06 DIAGNOSIS — I63232 Cerebral infarction due to unspecified occlusion or stenosis of left carotid arteries: Secondary | ICD-10-CM | POA: Insufficient documentation

## 2021-10-06 DIAGNOSIS — E1151 Type 2 diabetes mellitus with diabetic peripheral angiopathy without gangrene: Secondary | ICD-10-CM | POA: Insufficient documentation

## 2021-10-06 DIAGNOSIS — I63512 Cerebral infarction due to unspecified occlusion or stenosis of left middle cerebral artery: Principal | ICD-10-CM | POA: Insufficient documentation

## 2021-10-06 DIAGNOSIS — R41841 Cognitive communication deficit: Secondary | ICD-10-CM | POA: Insufficient documentation

## 2021-10-06 DIAGNOSIS — Z87891 Personal history of nicotine dependence: Secondary | ICD-10-CM | POA: Diagnosis not present

## 2021-10-06 DIAGNOSIS — R001 Bradycardia, unspecified: Secondary | ICD-10-CM | POA: Insufficient documentation

## 2021-10-06 DIAGNOSIS — I2510 Atherosclerotic heart disease of native coronary artery without angina pectoris: Secondary | ICD-10-CM | POA: Insufficient documentation

## 2021-10-06 DIAGNOSIS — R7303 Prediabetes: Secondary | ICD-10-CM | POA: Diagnosis not present

## 2021-10-06 DIAGNOSIS — M6281 Muscle weakness (generalized): Secondary | ICD-10-CM | POA: Insufficient documentation

## 2021-10-06 DIAGNOSIS — Z79899 Other long term (current) drug therapy: Secondary | ICD-10-CM | POA: Diagnosis not present

## 2021-10-06 DIAGNOSIS — E785 Hyperlipidemia, unspecified: Secondary | ICD-10-CM | POA: Diagnosis not present

## 2021-10-06 DIAGNOSIS — R55 Syncope and collapse: Secondary | ICD-10-CM | POA: Diagnosis present

## 2021-10-06 DIAGNOSIS — Z951 Presence of aortocoronary bypass graft: Secondary | ICD-10-CM | POA: Insufficient documentation

## 2021-10-06 DIAGNOSIS — I639 Cerebral infarction, unspecified: Secondary | ICD-10-CM | POA: Insufficient documentation

## 2021-10-06 DIAGNOSIS — Z7902 Long term (current) use of antithrombotics/antiplatelets: Secondary | ICD-10-CM | POA: Diagnosis not present

## 2021-10-06 DIAGNOSIS — G9389 Other specified disorders of brain: Secondary | ICD-10-CM | POA: Diagnosis not present

## 2021-10-06 DIAGNOSIS — I63213 Cerebral infarction due to unspecified occlusion or stenosis of bilateral vertebral arteries: Secondary | ICD-10-CM | POA: Insufficient documentation

## 2021-10-06 DIAGNOSIS — I739 Peripheral vascular disease, unspecified: Secondary | ICD-10-CM | POA: Insufficient documentation

## 2021-10-06 DIAGNOSIS — I6350 Cerebral infarction due to unspecified occlusion or stenosis of unspecified cerebral artery: Principal | ICD-10-CM

## 2021-10-06 DIAGNOSIS — R9431 Abnormal electrocardiogram [ECG] [EKG]: Secondary | ICD-10-CM | POA: Insufficient documentation

## 2021-10-06 DIAGNOSIS — E118 Type 2 diabetes mellitus with unspecified complications: Secondary | ICD-10-CM

## 2021-10-06 LAB — BASIC METABOLIC PANEL
Anion gap: 12 (ref 5–15)
BUN: 11 mg/dL (ref 8–23)
CO2: 23 mmol/L (ref 22–32)
Calcium: 9.3 mg/dL (ref 8.9–10.3)
Chloride: 106 mmol/L (ref 98–111)
Creatinine, Ser: 0.94 mg/dL (ref 0.61–1.24)
GFR, Estimated: >60 mL/min (ref >60)
Glucose, Bld: 179 mg/dL — ABNORMAL HIGH (ref 70–99)
Potassium: 4 mmol/L (ref 3.5–5.1)
Sodium: 141 mmol/L (ref 135–145)

## 2021-10-06 LAB — URINALYSIS, ROUTINE W REFLEX MICROSCOPIC
Bilirubin Urine: NEGATIVE
Glucose, UA: NEGATIVE mg/dL
Hgb urine dipstick: NEGATIVE
Ketones, ur: NEGATIVE mg/dL
Leukocytes,Ua: NEGATIVE
Nitrite: NEGATIVE
Protein, ur: NEGATIVE mg/dL
Specific Gravity, Urine: 1.016 (ref 1.005–1.030)
pH: 5 (ref 5.0–8.0)

## 2021-10-06 LAB — TROPONIN I (HIGH SENSITIVITY)
Troponin I (High Sensitivity): 6 ng/L (ref <18)
Troponin I (High Sensitivity): 8 ng/L (ref <18)

## 2021-10-06 LAB — CBG MONITORING, ED: Glucose-Capillary: 183 mg/dL — ABNORMAL HIGH (ref 70–99)

## 2021-10-06 LAB — LIPID PANEL
Cholesterol: 200 mg/dL (ref 0–200)
HDL: 45 mg/dL (ref >40)
LDL Cholesterol: 134 mg/dL — ABNORMAL HIGH (ref 0–99)
Total CHOL/HDL Ratio: 4.4 RATIO
Triglycerides: 107 mg/dL (ref <150)
VLDL: 21 mg/dL (ref 0–40)

## 2021-10-06 LAB — CBC
HCT: 45.2 % (ref 39.0–52.0)
Hemoglobin: 15.4 g/dL (ref 13.0–17.0)
MCH: 33.8 pg (ref 26.0–34.0)
MCHC: 34.1 g/dL (ref 30.0–36.0)
MCV: 99.3 fL (ref 80.0–100.0)
Platelets: 320 10*3/uL (ref 150–400)
RBC: 4.55 MIL/uL (ref 4.22–5.81)
RDW: 13.3 % (ref 11.5–15.5)
WBC: 8.4 10*3/uL (ref 4.0–10.5)
nRBC: 0 % (ref 0.0–0.2)

## 2021-10-06 LAB — HEMOGLOBIN A1C
Hgb A1c MFr Bld: 6.4 % — ABNORMAL HIGH (ref 4.8–5.6)
Mean Plasma Glucose: 136.98 mg/dL

## 2021-10-06 MED ORDER — SENNOSIDES-DOCUSATE SODIUM 8.6-50 MG PO TABS: 1.0000 | ORAL_TABLET | Freq: Every evening | ORAL | Status: DC | PRN

## 2021-10-06 MED ORDER — STROKE: EARLY STAGES OF RECOVERY BOOK
Freq: Once | Status: AC
  Filled 2021-10-06: qty 1

## 2021-10-06 MED ORDER — ENOXAPARIN SODIUM 40 MG/0.4ML IJ SOSY
40.0000 mg | PREFILLED_SYRINGE | Freq: Every day | INTRAMUSCULAR | Status: DC
  Administered 2021-10-07: 40 mg via SUBCUTANEOUS
  Filled 2021-10-06: qty 0.4

## 2021-10-06 MED ORDER — SODIUM CHLORIDE 0.9 % IV SOLN: INTRAVENOUS | Status: AC

## 2021-10-06 MED ORDER — ACETAMINOPHEN 325 MG PO TABS: 650.0000 mg | ORAL_TABLET | ORAL | Status: DC | PRN

## 2021-10-06 MED ORDER — ACETAMINOPHEN 650 MG RE SUPP: 650.0000 mg | RECTAL | Status: DC | PRN

## 2021-10-06 MED ORDER — GADOBUTROL 1 MMOL/ML IV SOLN
7.5000 mL | Freq: Once | INTRAVENOUS | Status: AC | PRN
  Administered 2021-10-06: 7.5 mL via INTRAVENOUS

## 2021-10-06 MED ORDER — ACETAMINOPHEN 160 MG/5ML PO SOLN: 650.0000 mg | ORAL | Status: DC | PRN

## 2021-10-06 NOTE — H&P (Incomplete)
History and Physical    Anastasios Mccloskey EAV:[id number] DOB: 1945/06/08 DOA: 10/06/2021  PCP: Doristine Bosworth, MD   Patient coming from: Home   Chief Complaint: Passed out   HPI: Aurelius Holl is a pleasant 76 y.o. male with medical history significant for CAD with CABG in 2017, history of CVA in the 1980s, peripheral arterial disease, and prediabetes who presents emergency department after syncopal episode yesterday.  The patient reports that he was having a normal day and doing some work outside when he bent over to start his weedeater and had a brief loss of consciousness.  No appreciable injury and he returned to his usual state fairly quickly but had not told his wife what happened until earlier today and she urged him to seek evaluation in the ED.  He denies any focal numbness or weakness at this time.  He relates that he had an episode in [place] approximately 3 weeks ago where he had been working outside in the heat and then felt disoriented and had some dizziness.  The symptoms had since resolved.  He is no longer taking a statin and has been taking ASA 81 mg and Plavix 75 mg on alternating days.   ED Course: Upon arrival to the ED, patient is found to be afebrile and saturating well on room air with heart rate in the 50s to 60s and stable blood pressure.  EKG features sinus bradycardia with rate 56.  Head CT concerning for acute infarction in the left frontal lobe without hemorrhage.  MRI brain reveals 4.7 cm acute to early subacute left MCA distribution infarction without hemorrhage or mass effect.  Neurology was consulted by the ED physician and recommended medical admission for stroke work-up.  Review of Systems:  All other systems reviewed and apart from HPI, are negative.  Past Medical History:  Diagnosis Date   Constipation    Diabetes mellitus without complication (HCC)    borderline/ pre diabetic   NSTEMI (non-ST elevated myocardial infarction) (HCC)    at [place]   Stroke (HCC)     L side - 40 years ago    Past Surgical History:  Procedure Laterality Date   CARDIAC CATHETERIZATION     CORONARY ARTERY BYPASS GRAFT     at [facility]    Social History:   reports that he quit smoking about 53 years ago. His smoking use included cigarettes. He has never used smokeless tobacco. He reports current alcohol use. He reports that he does not use drugs.  No Known Allergies  Family History  Problem Relation Age of Onset   Liver disease Father      Prior to Admission medications   Medication Sig Start Date End Date Taking? Authorizing Provider  acetaminophen (TYLENOL) 650 MG CR tablet Take 650 mg by mouth QID.    [provider]  aspirin EC 81 MG tablet Take 81 mg by mouth as directed.     [provider]  clopidogrel (PLAVIX) 75 MG tablet Take 1 tablet (75 mg total) by mouth daily. 07/01/21   Lewayne Bunting, MD  NEOMYCIN-POLYMYXIN-HYDROCORTISONE (CORTISPORIN) 1 % SOLN OTIC solution Apply 1-2 drops to toe BID after soaking Patient not taking: Reported on 09/01/2021 04/20/21   Elinor Parkinson, [place]    Physical Exam: Vitals:   10/06/21 2053 10/06/21 2054 10/06/21 2230 10/06/21 2333  BP: (!) 129/92  119/63 126/75  Pulse: 63 63 (!) 57 (!) 57  Resp: 13 13 13 16   Temp:  TempSrc:    Oral  SpO2: 97% 97% 97% 98%  Weight:    75.4 kg  Height:    5\' 5"  (1.651 m)    Constitutional: NAD, calm  Eyes: PERTLA, lids and conjunctivae normal ENMT: Mucous membranes are moist. Posterior pharynx clear of any exudate or lesions.   Neck: supple, no masses  Respiratory: no wheezing, no crackles. No accessory muscle use.  Cardiovascular: S1 & S2 heard, regular rate and rhythm. No extremity edema.   Abdomen: No distension, no tenderness, soft. Bowel sounds active.  Musculoskeletal: no clubbing / cyanosis. No joint deformity upper and lower extremities.   Skin: no significant rashes, lesions, ulcers. Warm, dry, well-perfused. Neurologic: CN 2-12 grossly intact.  Sensation to light touch intact. DTR normal. Strength 5/5 in all 4 limbs. Alert and oriented.  Psychiatric: Pleasant. Cooperative.    Labs and Imaging on Admission: I have personally reviewed following labs and imaging studies  CBC: Recent Labs  Lab 10/06/21 1405  WBC 8.4  HGB 15.4  HCT 45.2  MCV 99.3  PLT 320   Basic Metabolic Panel: Recent Labs  Lab 10/06/21 1405  NA 141  K 4.0  CL 106  CO2 23  GLUCOSE 179*  BUN 11  CREATININE 0.94  CALCIUM 9.3   GFR: Estimated Creatinine Clearance: 63.5 mL/min (by C-G formula based on SCr of 0.94 mg/dL). Liver Function Tests: No results for input(s): "AST", "ALT", "ALKPHOS", "BILITOT", "PROT", "ALBUMIN" in the last 168 hours. No results for input(s): "LIPASE", "AMYLASE" in the last 168 hours. No results for input(s): "AMMONIA" in the last 168 hours. Coagulation Profile: No results for input(s): "INR", "PROTIME" in the last 168 hours. Cardiac Enzymes: No results for input(s): "CKTOTAL", "CKMB", "CKMBINDEX", "TROPONINI" in the last 168 hours. BNP (last 3 results) No results for input(s): "PROBNP" in the last 8760 hours. HbA1C: Recent Labs    10/06/21 1421  HGBA1C 6.4*   CBG: Recent Labs  Lab 10/06/21 1401  GLUCAP 183*   Lipid Profile: Recent Labs    10/06/21 1421  CHOL 200  HDL 45  LDLCALC 134*  TRIG 107  CHOLHDL 4.4   Thyroid Function Tests: No results for input(s): "TSH", "T4TOTAL", "FREET4", "T3FREE", "THYROIDAB" in the last 72 hours. Anemia Panel: No results for input(s): "VITAMINB12", "FOLATE", "FERRITIN", "TIBC", "IRON", "RETICCTPCT" in the last 72 hours. Urine analysis:    Component Value Date/Time   COLORURINE YELLOW 10/06/2021 1354   APPEARANCEUR CLEAR 10/06/2021 1354   LABSPEC 1.016 10/06/2021 1354   PHURINE 5.0 10/06/2021 1354   GLUCOSEU NEGATIVE 10/06/2021 1354   HGBUR NEGATIVE 10/06/2021 1354   BILIRUBINUR NEGATIVE 10/06/2021 1354   KETONESUR NEGATIVE 10/06/2021 1354   PROTEINUR NEGATIVE  10/06/2021 1354   NITRITE NEGATIVE 10/06/2021 1354   LEUKOCYTESUR NEGATIVE 10/06/2021 1354   Sepsis Labs: @LABRCNTIP (procalcitonin:4,lacticidven:4) )No results found for this or any previous visit (from the past 240 hour(s)).   Radiological Exams on Admission: MR BRAIN W WO CONTRAST  Result Date: 10/06/2021 CLINICAL DATA:  Initial evaluation for acute stroke. EXAM: MRI HEAD WITHOUT AND WITH CONTRAST TECHNIQUE: Multiplanar, multiecho pulse sequences of the brain and surrounding structures were obtained without and with intravenous contrast. CONTRAST:  7.11mL GADAVIST GADOBUTROL 1 MMOL/ML IV SOLN COMPARISON:  Prior CT from earlier the same day as well as previous MRI from 08/15/2019. FINDINGS: Brain: Age-related cerebral atrophy. Patchy T2/FLAIR hyperintensity involving the periventricular deep white matter both cerebral hemispheres, most consistent with chronic small vessel ischemic disease, mild in nature. Encephalomalacia and gliosis at  the left parietal lobe consistent with a chronic ischemic infarct. Remote lacunar infarct present at the right pons. Confluent restricted diffusion involving the cortical and subcortical left frontal lobe measuring up to 4.7 cm, consistent with acute to early subacute left MCA distribution infarct. No significant associated blood products. No mass effect. Associated mild patchy post-contrast enhancement noted. No other evidence for acute or subacute ischemia. Gray-white matter differentiation otherwise maintained. Minimal chronic hemosiderin staining noted about the chronic left parietal infarct. No other acute or chronic intracranial blood products. No mass lesion, midline shift or mass effect no hydrocephalus or extra-axial fluid collection. Pituitary gland suprasellar region normal. No other abnormal enhancement. Vascular: Major intracranial vascular flow voids are maintained. Skull and upper cervical spine: Craniocervical junction within normal limits. Bone marrow  signal intensity within normal limits. No scalp soft tissue abnormality. Sinuses/Orbits: Prior bilateral ocular lens replacement. Scattered mucosal thickening noted throughout the paranasal sinuses. No significant mastoid effusion. Other: None. IMPRESSION: 1. 4.7 cm acute to early subacute left MCA distribution infarct. No associated hemorrhage or significant mass effect. 2. No other acute intracranial abnormality. 3. 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 4. Underlying age-related cerebral atrophy with mild chronic small vessel ischemic disease. Electronically Signed   By: Rise Mu M.D.   On: 10/06/2021 23:07   CT Head Wo Contrast  Result Date: 10/06/2021 CLINICAL DATA:  Syncope/presyncope, cerebrovascular cause suspected EXAM: CT HEAD WITHOUT CONTRAST TECHNIQUE: Contiguous axial images were obtained from the base of the skull through the vertex without intravenous contrast. RADIATION DOSE REDUCTION: This exam was performed according to the departmental dose-optimization program which includes automated exposure control, adjustment of the mA and/or kV according to patient size and/or use of iterative reconstruction technique. COMPARISON:  MRI 08/15/2019 FINDINGS: Brain: There is new loss of gray-white matter differentiation in the left frontal lobe consistent with acute infarct (series 3, image 22). There is encephalomalacia in the high left parietal cortex consistent with chronic infarct.No abnormal extra-axial collection. No concerning mass effect.No midline shift. The ventricular system is normal in size.Additional nonspecific areas of subcortical and periventricular hypoattenuation likely related to chronic small vessel ischemic disease. Vascular: No hyperdense vessel. Skull: Negative for skull fracture given limitations of motion artifact. Sinuses/Orbits: Mild posterior left ethmoid air cell mucosal thickening. Other: None. IMPRESSION: Findings concerning for acute infarct in the  left frontal lobe. No acute intracranial hemorrhage. Encephalomalacia in the high left parietal cortex consistent with old infarct. Critical Value/emergent results were called by telephone at the time of interpretation on 10/06/2021 at 3:28 pm to provider [facility] , who verbally acknowledged these results. Electronically Signed   By: Caprice Renshaw M.D.   On: 10/06/2021 15:29    EKG: Independently reviewed. Sinus bradycardia, rate 56.   Assessment/Plan   1. Acute ischemic stroke  - Presents after a syncopal episode yesterday and had head CT concerning for acute CVA  - MRI findings consistent with acute to subacute left MCA distribution infarct  - Appreciate neurology recommendations  - Resume DAPT and Crestor, continue cardiac monitoring, check TTE, CTA head and neck, lipids, and A1c, consult PT/OT/SLP    2. Syncope  - Patient had a syncopal episode yesterday when bending down to start his weed eater  - EKG with sinus bradycardia, rate 56  - Check orthostatic vitals, continue cardiac monitoring, check echocardiogram   3. CAD - Hx of CABG in 2017  - No anginal complaints  - Resume DAPT and Crestor     DVT prophylaxis:  Lovenox  Code Status: Full  Level of Care: Level of care: Telemetry Medical Family Communication: Wife at bedside  Disposition Plan:  Patient is from: home  Anticipated d/c is to: TBD Anticipated d/c date is: 10/08/21  Patient currently: pending stroke w/u, therapy assessments  Consults called: neurology  Admission status: Inpatient     Briscoe Deutscher, MD [facility]  10/07/2021, 12:48 AM

## 2021-10-06 NOTE — ED Triage Notes (Signed)
Pt states he passed out yesterday for about 54mns and has felt weak and numb to lt side ever since. States hx of stroke and this feels the same. No facial droop or drift noted. Pt speaking in complete sentences.

## 2021-10-06 NOTE — ED Provider Triage Note (Addendum)
Emergency Medicine Provider Triage Evaluation Note  [name] [name] , a 76 y.o. male  was evaluated in triage.  Pt complains of syncope. Around 7am yesterday pt was outside trying to start his weed eater but then felt lightheadedness and sts he passed out for 5 min.  Since then he has been feeling disoriented.  No fever, vision changes, neck pain, headache, focal numbness or focal weakness  Review of Systems  Positive: As above Negative: As above  Physical Exam  BP 132/73 (BP Location: Right Arm)   Pulse (!) 54   Temp 97.7 F (36.5 C) (Oral)   Resp 16   SpO2 98%  Gen:   Awake, no distress   Resp:  Normal effort  MSK:   Moves extremities without difficulty  Other:    Medical Decision Making  Medically screening exam initiated at 1:48 PM.  Appropriate orders placed.  [name] [name] was informed that the remainder of the evaluation will be completed by another provider, this initial triage assessment does not replace that evaluation, and the importance of remaining in the ED until their evaluation is complete.     Domenic Moras, PA-C 10/06/21 1354   ADDENDUM: radiologist notified head CT scan shows pt had a L frontal lobe ischemia.  Will request charge nurse to have pt seen promptly.  Pt is outside the stroke window.   Domenic Moras, PA-C 10/06/21 1530

## 2021-10-06 NOTE — ED Provider Notes (Signed)
[place] EMERGENCY DEPARTMENT Provider Note   CSN: [id number] Arrival date & time: 10/06/21  1310     History  Chief Complaint  Patient presents with  . Loss of Consciousness    [name] [name] is a 75 y.o. male.  76 year old male with a past medical history of coronary artery disease s/p CABG, prior left MCA CVA currently on aspirin and Plavix presents to the ED following a syncopal episode that occurred around 7 AM on Monday.  Patient states that he went outside to start of his lawnmower when he suddenly felt dizzy and lightheaded before passing out.  He states that he presented to the emergency room today after he told his wife about the incident. He states that over the past 3 weeks, he has felt like he has had difficulty speaking.  He states that he is typically very conversant but has not been speaking as much due to difficulty with word finding.  He also states that he has felt more confused and has had an intermittent left-sided frontal headache during this time.  He reports being seen at a hospital in [place] for these symptoms and was told everything looked normal. Wife at bedside denies noticing any facial asymmetry or dysarthria but confirms that the patient has not been as talkative recently.  Patient also states that he has been having diffuse weakness and numbness, denies that symptoms are localized to one side.  The history is provided by the patient, medical records and the spouse.       Home Medications Prior to Admission medications   Medication Sig Start Date End Date Taking? Authorizing Provider  acetaminophen (TYLENOL) 650 MG CR tablet Take 650 mg by mouth QID.    [provider]  aspirin EC 81 MG tablet Take 81 mg by mouth as directed.     [provider]  clopidogrel (PLAVIX) 75 MG tablet Take 1 tablet (75 mg total) by mouth daily. 07/01/21   Lelon Perla, MD  NEOMYCIN-POLYMYXIN-HYDROCORTISONE (CORTISPORIN) 1 % SOLN OTIC  solution Apply 1-2 drops to toe BID after soaking Patient not taking: Reported on 09/01/2021 04/20/21   Garrel Ridgel, DPM      Allergies    Patient has no known allergies.    Review of Systems   Review of Systems  Constitutional:  Positive for fatigue. Negative for chills and fever.  Respiratory:  Negative for cough and shortness of breath.   Cardiovascular:  Negative for chest pain.  Gastrointestinal:  Negative for abdominal pain, diarrhea, nausea and vomiting.  Neurological:  Positive for dizziness, syncope, speech difficulty, weakness (generalized), light-headedness, numbness (generalized) and headaches.    Physical Exam Updated Vital Signs BP (!) 129/92   Pulse 63   Temp 97.7 F (36.5 C) (Oral)   Resp 13   SpO2 97%  Physical Exam Vitals and nursing note reviewed.  Constitutional:      General: He is not in acute distress.    Appearance: Normal appearance. He is well-developed. He is not ill-appearing.  HENT:     Head: Normocephalic and atraumatic.     Mouth/Throat:     Mouth: Mucous membranes are moist.     Pharynx: Oropharynx is clear.  Eyes:     Extraocular Movements: Extraocular movements intact.     Right eye: Normal extraocular motion and no nystagmus.     Left eye: Normal extraocular motion and no nystagmus.     Conjunctiva/sclera: Conjunctivae normal.     Pupils: Pupils  are equal, round, and reactive to light.     Visual Fields: Right eye visual fields normal and left eye visual fields normal.  Cardiovascular:     Rate and Rhythm: Normal rate and regular rhythm.     Heart sounds: No murmur heard. Pulmonary:     Effort: Pulmonary effort is normal. No respiratory distress.     Breath sounds: Normal breath sounds. No wheezing.  Chest:     Comments: Midline sternotomy scar present Abdominal:     Palpations: Abdomen is soft.     Tenderness: There is no abdominal tenderness. There is no guarding.  Musculoskeletal:        General: No swelling.     Cervical  back: Neck supple.  Skin:    General: Skin is warm and dry.  Neurological:     Mental Status: He is alert.     GCS: GCS eye subscore is 4. GCS verbal subscore is 5. GCS motor subscore is 6.     Comments: Awake, alert and answers questions appropriately.  He has mild left lower facial droop at rest with symmetric activation.  No overt dysarthria.  Cranial nerves III - XII are intact. Strength 5/5 to the proximal and distal bilateral upper and lower extremities.  Sensation is equal and symmetric to the V1-V3 distribution as well as to the bilateral upper and lower extremities. No tremor or drift.  Intact finger-to-nose bilaterally.    ED Results / Procedures / Treatments   Labs (all labs ordered are listed, but only abnormal results are displayed) Labs Reviewed  BASIC METABOLIC PANEL - Abnormal; Notable for the following components:      Result Value   Glucose, Bld 179 (*)    All other components within normal limits  HEMOGLOBIN A1C - Abnormal; Notable for the following components:   Hgb A1c MFr Bld 6.4 (*)    All other components within normal limits  LIPID PANEL - Abnormal; Notable for the following components:   LDL Cholesterol 134 (*)    All other components within normal limits  CBG MONITORING, ED - Abnormal; Notable for the following components:   Glucose-Capillary 183 (*)    All other components within normal limits  CBC  URINALYSIS, ROUTINE W REFLEX MICROSCOPIC  TROPONIN I (HIGH SENSITIVITY)  TROPONIN I (HIGH SENSITIVITY)    EKG None  Radiology CT Head Wo Contrast  Result Date: 10/06/2021 CLINICAL DATA:  Syncope/presyncope, cerebrovascular cause suspected EXAM: CT HEAD WITHOUT CONTRAST TECHNIQUE: Contiguous axial images were obtained from the base of the skull through the vertex without intravenous contrast. RADIATION DOSE REDUCTION: This exam was performed according to the departmental dose-optimization program which includes automated exposure control, adjustment of the  mA and/or kV according to patient size and/or use of iterative reconstruction technique. COMPARISON:  MRI 08/15/2019 FINDINGS: Brain: There is new loss of gray-white matter differentiation in the left frontal lobe consistent with acute infarct (series 3, image 22). There is encephalomalacia in the high left parietal cortex consistent with chronic infarct.No abnormal extra-axial collection. No concerning mass effect.No midline shift. The ventricular system is normal in size.Additional nonspecific areas of subcortical and periventricular hypoattenuation likely related to chronic small vessel ischemic disease. Vascular: No hyperdense vessel. Skull: Negative for skull fracture given limitations of motion artifact. Sinuses/Orbits: Mild posterior left ethmoid air cell mucosal thickening. Other: None. IMPRESSION: Findings concerning for acute infarct in the left frontal lobe. No acute intracranial hemorrhage. Encephalomalacia in the high left parietal cortex consistent with old infarct. Critical  Value/emergent results were called by telephone at the time of interpretation on 10/06/2021 at 3:28 pm to provider [facility] , who verbally acknowledged these results. Electronically Signed   By: Maurine Simmering M.D.   On: 10/06/2021 15:29    Procedures Procedures    Medications Ordered in ED Medications  gadobutrol (GADAVIST) 1 MMOL/ML injection 7.5 mL (7.5 mLs Intravenous Contrast Given 10/06/21 2032)    ED Course/ Medical Decision Making/ A&P                           Medical Decision Making Amount and/or Complexity of Data Reviewed External Data Reviewed: labs and notes. Labs: ordered. Radiology: ordered and independent interpretation performed. ECG/medicine tests: ordered and independent interpretation performed.  Risk Prescription drug management. Decision regarding hospitalization.   76 year old male with a history as above presents today with 3 weeks of subacute word finding difficulty with a syncopal  episode 2 days ago.  On arrival, patient is well-appearing and hemodynamically stable.  On neuro exam, he has subtle left-sided facial droop at rest with symmetric activation.  No signs of focal weakness or numbness.  Patient received a CT head  Final Clinical Impression(s) / ED Diagnoses Final diagnoses:  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 ([place])  Syncope and collapse    Rx / DC Orders ED Discharge Orders     None

## 2021-10-06 NOTE — ED Provider Notes (Incomplete)
[place] EMERGENCY DEPARTMENT Provider Note   CSN: [id number] Arrival date & time: 10/06/21  1310     History  Chief Complaint  Patient presents with  . Loss of Consciousness    [name] [name] is a 76 y.o. male.  76 year old male with a past medical history of coronary artery disease s/p CABG, prior left MCA CVA currently on aspirin and Plavix presents to the ED following a syncopal episode that occurred around 7 AM on Monday.  Patient states that he went outside to start of his lawnmower when he suddenly felt dizzy and lightheaded before passing out.  He states that he presented to the emergency room today after he told his wife about the incident. He states that over the past 3 weeks, he has felt like he has had difficulty speaking.  He states that he is typically very conversant but has not been speaking as much due to difficulty with word finding.  He also states that he has felt more confused and has had an intermittent left-sided frontal headache during this time.  He reports being seen at a hospital in [place] for these symptoms and was told everything looked normal. Wife at bedside denies noticing any facial asymmetry or dysarthria but confirms that the patient has not been as talkative recently.  Patient also states that he has been having diffuse weakness and numbness, denies that symptoms are localized to one side.  The history is provided by the patient, medical records and the spouse.       Home Medications Prior to Admission medications   Medication Sig Start Date End Date Taking? Authorizing Provider  acetaminophen (TYLENOL) 650 MG CR tablet Take 650 mg by mouth QID.    [provider]  aspirin EC 81 MG tablet Take 81 mg by mouth as directed.     [provider]  clopidogrel (PLAVIX) 75 MG tablet Take 1 tablet (75 mg total) by mouth daily. 07/01/21   Lelon Perla, MD  NEOMYCIN-POLYMYXIN-HYDROCORTISONE (CORTISPORIN) 1 % SOLN OTIC  solution Apply 1-2 drops to toe BID after soaking Patient not taking: Reported on 09/01/2021 04/20/21   Garrel Ridgel, DPM      Allergies    Patient has no known allergies.    Review of Systems   Review of Systems  Constitutional:  Positive for fatigue. Negative for chills and fever.  Respiratory:  Negative for cough and shortness of breath.   Cardiovascular:  Negative for chest pain.  Gastrointestinal:  Negative for abdominal pain, diarrhea, nausea and vomiting.  Neurological:  Positive for dizziness, syncope, speech difficulty, weakness (generalized), light-headedness, numbness (generalized) and headaches.    Physical Exam Updated Vital Signs BP 126/75 (BP Location: Left Arm)   Pulse (!) 57   Temp 97.7 F (36.5 C) (Oral)   Resp 16   Ht '5\' 5"'$  (1.651 m)   Wt 75.4 kg   SpO2 98%   BMI 27.66 kg/m  Physical Exam Vitals and nursing note reviewed.  Constitutional:      General: He is not in acute distress.    Appearance: Normal appearance. He is well-developed. He is not ill-appearing.  HENT:     Head: Normocephalic and atraumatic.     Mouth/Throat:     Mouth: Mucous membranes are moist.     Pharynx: Oropharynx is clear.  Eyes:     Extraocular Movements: Extraocular movements intact.     Right eye: Normal extraocular motion and no nystagmus.  Left eye: Normal extraocular motion and no nystagmus.     Conjunctiva/sclera: Conjunctivae normal.     Pupils: Pupils are equal, round, and reactive to light.     Visual Fields: Right eye visual fields normal and left eye visual fields normal.  Cardiovascular:     Rate and Rhythm: Normal rate and regular rhythm.     Heart sounds: No murmur heard. Pulmonary:     Effort: Pulmonary effort is normal. No respiratory distress.     Breath sounds: Normal breath sounds. No wheezing.  Chest:     Comments: Midline sternotomy scar present Abdominal:     Palpations: Abdomen is soft.     Tenderness: There is no abdominal tenderness. There is  no guarding.  Musculoskeletal:        General: No swelling.     Cervical back: Neck supple.  Skin:    General: Skin is warm and dry.  Neurological:     Mental Status: He is alert.     GCS: GCS eye subscore is 4. GCS verbal subscore is 5. GCS motor subscore is 6.     Comments: Awake, alert and answers questions appropriately.  He has mild left lower facial droop at rest with symmetric activation.  No overt dysarthria.  Cranial nerves III - XII are intact. Strength 5/5 to the proximal and distal bilateral upper and lower extremities.  Sensation is equal and symmetric to the V1-V3 distribution as well as to the bilateral upper and lower extremities. No tremor or drift.  Intact finger-to-nose bilaterally.     ED Results / Procedures / Treatments   Labs (all labs ordered are listed, but only abnormal results are displayed) Labs Reviewed  BASIC METABOLIC PANEL - Abnormal; Notable for the following components:      Result Value   Glucose, Bld 179 (*)    All other components within normal limits  HEMOGLOBIN A1C - Abnormal; Notable for the following components:   Hgb A1c MFr Bld 6.4 (*)    All other components within normal limits  LIPID PANEL - Abnormal; Notable for the following components:   LDL Cholesterol 134 (*)    All other components within normal limits  CBG MONITORING, ED - Abnormal; Notable for the following components:   Glucose-Capillary 183 (*)    All other components within normal limits  CBC  URINALYSIS, ROUTINE W REFLEX MICROSCOPIC  CBC  COMPREHENSIVE METABOLIC PANEL  TROPONIN I (HIGH SENSITIVITY)  TROPONIN I (HIGH SENSITIVITY)    EKG None  Radiology MR BRAIN W WO CONTRAST  Result Date: 10/06/2021 CLINICAL DATA:  Initial evaluation for acute stroke. EXAM: MRI HEAD WITHOUT AND WITH CONTRAST TECHNIQUE: Multiplanar, multiecho pulse sequences of the brain and surrounding structures were obtained without and with intravenous contrast. CONTRAST:  7.62m GADAVIST  GADOBUTROL 1 MMOL/ML IV SOLN COMPARISON:  Prior CT from earlier the same day as well as previous MRI from 08/15/2019. FINDINGS: Brain: Age-related cerebral atrophy. Patchy T2/FLAIR hyperintensity involving the periventricular deep white matter both cerebral hemispheres, most consistent with chronic small vessel ischemic disease, mild in nature. Encephalomalacia and gliosis at the left parietal lobe consistent with a chronic ischemic infarct. Remote lacunar infarct present at the right pons. Confluent restricted diffusion involving the cortical and subcortical left frontal lobe measuring up to 4.7 cm, consistent with acute to early subacute left MCA distribution infarct. No significant associated blood products. No mass effect. Associated mild patchy post-contrast enhancement noted. No other evidence for acute or subacute ischemia. Gray-white matter  differentiation otherwise maintained. Minimal chronic hemosiderin staining noted about the chronic left parietal infarct. No other acute or chronic intracranial blood products. No mass lesion, midline shift or mass effect no hydrocephalus or extra-axial fluid collection. Pituitary gland suprasellar region normal. No other abnormal enhancement. Vascular: Major intracranial vascular flow voids are maintained. Skull and upper cervical spine: Craniocervical junction within normal limits. Bone marrow signal intensity within normal limits. No scalp soft tissue abnormality. Sinuses/Orbits: Prior bilateral ocular lens replacement. Scattered mucosal thickening noted throughout the paranasal sinuses. No significant mastoid effusion. Other: None. IMPRESSION: 1. 4.7 cm acute to early subacute left MCA distribution infarct. No associated hemorrhage or significant mass effect. 2. No other acute intracranial abnormality. 3. 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 4. Underlying age-related cerebral atrophy with mild chronic small vessel ischemic disease. Electronically Signed    By: Jeannine Boga M.D.   On: 10/06/2021 23:07   CT Head Wo Contrast  Result Date: 10/06/2021 CLINICAL DATA:  Syncope/presyncope, cerebrovascular cause suspected EXAM: CT HEAD WITHOUT CONTRAST TECHNIQUE: Contiguous axial images were obtained from the base of the skull through the vertex without intravenous contrast. RADIATION DOSE REDUCTION: This exam was performed according to the departmental dose-optimization program which includes automated exposure control, adjustment of the mA and/or kV according to patient size and/or use of iterative reconstruction technique. COMPARISON:  MRI 08/15/2019 FINDINGS: Brain: There is new loss of gray-white matter differentiation in the left frontal lobe consistent with acute infarct (series 3, image 22). There is encephalomalacia in the high left parietal cortex consistent with chronic infarct.No abnormal extra-axial collection. No concerning mass effect.No midline shift. The ventricular system is normal in size.Additional nonspecific areas of subcortical and periventricular hypoattenuation likely related to chronic small vessel ischemic disease. Vascular: No hyperdense vessel. Skull: Negative for skull fracture given limitations of motion artifact. Sinuses/Orbits: Mild posterior left ethmoid air cell mucosal thickening. Other: None. IMPRESSION: Findings concerning for acute infarct in the left frontal lobe. No acute intracranial hemorrhage. Encephalomalacia in the high left parietal cortex consistent with old infarct. Critical Value/emergent results were called by telephone at the time of interpretation on 10/06/2021 at 3:28 pm to provider [facility] , who verbally acknowledged these results. Electronically Signed   By: Maurine Simmering M.D.   On: 10/06/2021 15:29    Procedures Procedures    Medications Ordered in ED Medications   stroke: early stages of recovery book (has no administration in time range)  0.9 %  sodium chloride infusion ( Intravenous New Bag/Given  10/06/21 2353)  acetaminophen (TYLENOL) tablet 650 mg (has no administration in time range)    Or  acetaminophen (TYLENOL) 160 MG/5ML solution 650 mg (has no administration in time range)    Or  acetaminophen (TYLENOL) suppository 650 mg (has no administration in time range)  senna-docusate (Senokot-S) tablet 1 tablet (has no administration in time range)  enoxaparin (LOVENOX) injection 40 mg (has no administration in time range)  gadobutrol (GADAVIST) 1 MMOL/ML injection 7.5 mL (7.5 mLs Intravenous Contrast Given 10/06/21 2032)    ED Course/ Medical Decision Making/ A&P                           Medical Decision Making Amount and/or Complexity of Data Reviewed External Data Reviewed: labs and notes. Labs: ordered. Radiology: ordered and independent interpretation performed. ECG/medicine tests: ordered and independent interpretation performed.  Risk Prescription drug management. Decision regarding hospitalization.   76 year old male with a history as above  presents today with 3 weeks of subacute word finding difficulty with a syncopal episode 2 days ago.  On arrival, patient is well-appearing and hemodynamically stable.  On neuro exam, he has subtle left-sided facial droop at rest with symmetric activation.  No signs of focal weakness or numbness.  CT head was performed while the patient was in the waiting room was concerning for an acute ischemic infarct of the left frontal lobe.  I reviewed and interpreted the patient's labs which are largely reassuring.  He has no significant leukocytosis or leukopenia, no new anemia.  BMP without significant electrolyte abnormalities, no evidence of renal dysfunction.  Troponin stable at 6 -> 8. UA without evidence of infection.  Given this, neurology was consulted who evaluated the patient in the ED.  During this time, we obtained an MRI brain which confirmed an acute early subacute left MCA infarct without associated hemorrhage or mass effect.   Neurology recommends admission to medicine for further stroke work-up.  I spoke with the inpatient medicine team who is agreeable to admit the patient to their service.  Plan of care was discussed with the patient and family at bedside who verbalized understanding.  No further acute events or interventions prior to transition of care to the inpatient medicine team.  Final Clinical Impression(s) / ED Diagnoses Final diagnoses:  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 ([place])  Syncope and collapse    Rx / DC Orders ED Discharge Orders     None

## 2021-10-06 NOTE — Consult Note (Signed)
Neurology Consultation  Reason for Consult: abnormal CTH Referring Physician: Dr Matilde Sprang  CC: syncope, bilateral hand numbness that is intermittent  History is obtained from:   HPI: [name] [name] is a 76 y.o. male past medical history of a left hemispheric stroke with some right arm weakness that might have been residual but for most part has made good recovery per family from a stroke 53 years ago, coronary artery disease status post CABG, diabetes, presented to the emergency room for evaluation of an episode of syncope that happened yesterday 10/05/2021 at 7 AM.  His wife only discovered that he had a syncopal episode after he told her today and that is why she brought him in for further evaluation.  They were in [place] earlier this month and he had a near syncopal episode for which she was seen at a hospital in [place] with serial troponins that were reassuring.  No head imaging available-care everywhere reviewed. He also reports of intermittent hand tingling and numbness-currently resolved. He had a head CT done that showed a possible evolving acute infarct in the left frontal region.  An MRI brain has been ordered.  Neurological consultation was obtained for the abnormal CT findings.   LKW: Unclear because there are no focal neurological deficits reported IV thrombolysis given?: no, no focal deficits, syncopal episode happened 7 AM yesterday which is greater than 24 hours out Premorbid modified Rankin scale (mRS): 2   ROS: Full ROS was performed and is negative except as noted in the HPI.  Past Medical History:  Diagnosis Date   Constipation    Diabetes mellitus without complication ([place])    borderline/ pre diabetic   NSTEMI (non-ST elevated myocardial infarction) ([place])    at [place]   Stroke ([place])    L side - 40 years ago     Family History  Problem Relation Age of Onset   Liver disease Father      Social History:   reports that he quit smoking about 53 years ago. His smoking  use included cigarettes. He has never used smokeless tobacco. He reports current alcohol use. He reports that he does not use drugs.  Medications No current facility-administered medications for this encounter.  Current Outpatient Medications:    acetaminophen (TYLENOL) 650 MG CR tablet, Take 650 mg by mouth QID., Disp: , Rfl:    aspirin EC 81 MG tablet, Take 81 mg by mouth as directed. , Disp: , Rfl:    clopidogrel (PLAVIX) 75 MG tablet, Take 1 tablet (75 mg total) by mouth daily., Disp: 90 tablet, Rfl: 3   NEOMYCIN-POLYMYXIN-HYDROCORTISONE (CORTISPORIN) 1 % SOLN OTIC solution, Apply 1-2 drops to toe BID after soaking (Patient not taking: Reported on 09/01/2021), Disp: 10 mL, Rfl: 1   Exam: Current vital signs: BP (!) 146/78   Pulse 61   Temp 97.7 F (36.5 C) (Oral)   Resp 19   SpO2 100%  Vital signs in last 24 hours: Temp:  [97.6 F (36.4 C)-97.7 F (36.5 C)] 97.7 F (36.5 C) (06/28 1326) Pulse Rate:  [52-64] 61 (06/28 2000) Resp:  [15-19] 19 (06/28 2000) BP: (124-146)/(62-78) 146/78 (06/28 2000) SpO2:  [97 %-100 %] 100 % (06/28 2000) General: Awake alert in no distress HEENT: Normocephalic atraumatic, dry oral mucous membranes, no nephropathy Lungs clear with no wheezing Cardiovascular: Regular rate rhythm, equal pulses bilaterally Abdomen soft nondistended nontender Extremities warm well perfused with intact pulses Neurological exam Awake alert oriented x3 No dysarthria No aphasia Cranial nerves II to  XII intact Motor examination with no drift in any of the 4 extremities Sensation intact light touch without extinction Coordination with no dysmetria NIH stroke scale-0    Labs I have reviewed labs in epic and the results pertinent to this consultation are:  CBC    Component Value Date/Time   WBC 8.4 10/06/2021 1405   RBC 4.55 10/06/2021 1405   HGB 15.4 10/06/2021 1405   HCT 45.2 10/06/2021 1405   PLT 320 10/06/2021 1405   MCV 99.3 10/06/2021 1405   MCH 33.8  10/06/2021 1405   MCHC 34.1 10/06/2021 1405   RDW 13.3 10/06/2021 1405    CMP     Component Value Date/Time   NA 141 10/06/2021 1405   K 4.0 10/06/2021 1405   CL 106 10/06/2021 1405   CO2 23 10/06/2021 1405   GLUCOSE 179 (H) 10/06/2021 1405   BUN 11 10/06/2021 1405   CREATININE 0.94 10/06/2021 1405   CALCIUM 9.3 10/06/2021 1405   PROT 7.0 11/15/2016 0942   ALBUMIN 4.3 11/15/2016 0942   AST 26 11/15/2016 0942   ALT 24 11/15/2016 0942   ALKPHOS 65 11/15/2016 0942   BILITOT 0.4 11/15/2016 0942   GFRNONAA >60 10/06/2021 1405    Lipid Panel     Component Value Date/Time   CHOL 200 10/06/2021 1421   CHOL 144 11/15/2016 0929   TRIG 107 10/06/2021 1421   HDL 45 10/06/2021 1421   HDL 46 11/15/2016 0929   CHOLHDL 4.4 10/06/2021 1421   VLDL 21 10/06/2021 1421   LDLCALC 134 (H) 10/06/2021 1421   LDLCALC 84 11/15/2016 0929     Imaging I have reviewed the images obtained:  CT-head: Possible acute infarct in the left frontal lobe.  Encephalomalacia in the high left parietal cortex consistent with old infarct.  No bleed.   Assessment: 76 year old presenting with an episode of syncope yesterday with no current focal neurological deficits with head imaging concerning for a possible acute infarct in the left frontal lobe on the CT head. Pending MRI. Has a history of prior strokes. Has a history of coronary artery disease status post CABG. Labs reveal hyper lipidemia with LDL 134 On DAPT at home Unclear if imaging findings, which are definitely new from last year are acute or have happened somewhat in the last year. MRI would be helpful  Recommendations: MRI brain without contrast-if it shows an acute or subacute stroke, admit for stroke work-up to include CT angiography head and neck, 2D echocardiogram, A1c, frequent neurochecks, PT, OT and speech therapy assessments. Continue dual antiplatelets. If the MRI brain is negative for any acute process, he can continue to follow  outpatient for the syncopal episode or if deemed appropriate by the ED can be admitted inpatient for an echocardiogram and an EEG also can be considered given his prior history of strokes and encephalomalacia seen on CT head. Plan discussed with Dr. Matilde Sprang  -- Amie Portland, MD Neurologist [facility] Pager: [phone]

## 2021-10-06 NOTE — ED Notes (Signed)
Patient is being discharged from the Urgent Care and sent to the Emergency Department via POV . Per Hagler, patient is in need of higher level of care due to Stroke like sx's. Patient is aware and verbalizes understanding of plan of care.  Vitals:   10/06/21 1300  BP: 133/62  Pulse: 64  Resp: 18  Temp: 97.6 F (36.4 C)  SpO2: 100%

## 2021-10-06 NOTE — Progress Notes (Signed)
MR brain completed. Positive for stroke. Admit to hospitalist for stroke w/u  -- Amie Portland, MD Neurologist [facility] Pager: [phone]

## 2021-10-06 NOTE — ED Notes (Signed)
ED TO INPATIENT HANDOFF REPORT  ED Nurse Name and Phone #: Stanton Kidney, RN, [phone]  S Name/Age/Gender [name] [name] 76 y.o. male Room/Bed: 023C/023C  Code Status   Code Status: Not on file  Home/SNF/Other Home Patient oriented to: self, place, time, and situation Is this baseline? Yes   Triage Complete: Triage complete  Chief Complaint Acute ischemic stroke [facility]) [I63.9]  Triage Note Pt here with reports of being disoriented and having generalized weakness along with tingling in bil hands after having a syncopal event.    Allergies No Known Allergies  Level of Care/Admitting Diagnosis ED Disposition     ED Disposition  Admit   Condition  --   Comment  Hospital Area: [place] [[id number]]  Level of Care: Telemetry Medical [104]  May admit patient to [facility] or [facility] if equivalent level of care is available:: No  Covid Evaluation: Asymptomatic - no recent exposure (last 10 days) testing not required  Diagnosis: Acute ischemic stroke [facility]) [[id number]]  Admitting Physician: Vianne Bulls [[id number]]  Attending Physician: Vianne Bulls [[id number]]  Estimated length of stay: past midnight tomorrow  Certification:: I certify this patient will need inpatient services for at least 2 midnights          B Medical/Surgery History Past Medical History:  Diagnosis Date   Constipation    Diabetes mellitus without complication ([place])    borderline/ pre diabetic   NSTEMI (non-ST elevated myocardial infarction) ([place])    at [place]   Stroke ([place])    L side - 40 years ago   Past Surgical History:  Procedure Laterality Date   CARDIAC CATHETERIZATION     CORONARY ARTERY BYPASS GRAFT     at [facility]     A IV Location/Drains/Wounds Patient Lines/Drains/Airways Status     Active Line/Drains/Airways     Name Placement date Placement time Site Days   Peripheral IV 10/06/21 22 G 1.75" Anterior;Right Forearm 10/06/21  1841  Forearm  less than 1             Intake/Output Last 24 hours No intake or output data in the 24 hours ending 10/06/21 2218  Labs/Imaging Results for orders placed or performed during the hospital encounter of 10/06/21 (from the past 48 hour(s))  Urinalysis, Routine w reflex microscopic Urine, Clean Catch     Status: None   Collection Time: 10/06/21  1:54 PM  Result Value Ref Range   Color, Urine YELLOW YELLOW   APPearance CLEAR CLEAR   Specific Gravity, Urine 1.016 1.005 - 1.030   pH 5.0 5.0 - 8.0   Glucose, UA NEGATIVE NEGATIVE mg/dL   Hgb urine dipstick NEGATIVE NEGATIVE   Bilirubin Urine NEGATIVE NEGATIVE   Ketones, ur NEGATIVE NEGATIVE mg/dL   Protein, ur NEGATIVE NEGATIVE mg/dL   Nitrite NEGATIVE NEGATIVE   Leukocytes,Ua NEGATIVE NEGATIVE    Comment: Performed at [facility], [address], [place], [place] [address]  CBG monitoring, ED     Status: Abnormal   Collection Time: 10/06/21  2:01 PM  Result Value Ref Range   Glucose-Capillary 183 (H) 70 - 99 mg/dL    Comment: Glucose reference range applies only to samples taken after fasting for at least 8 hours.   Comment 1 Notify RN    Comment 2 Document in Chart   Basic metabolic panel     Status: Abnormal   Collection Time: 10/06/21  2:05 PM  Result Value Ref Range   Sodium 141  135 - 145 mmol/L   Potassium 4.0 3.5 - 5.1 mmol/L   Chloride 106 98 - 111 mmol/L   CO2 23 22 - 32 mmol/L   Glucose, Bld 179 (H) 70 - 99 mg/dL    Comment: Glucose reference range applies only to samples taken after fasting for at least 8 hours.   BUN 11 8 - 23 mg/dL   Creatinine, Ser 0.94 0.61 - 1.24 mg/dL   Calcium 9.3 8.9 - 10.3 mg/dL   GFR, Estimated >60 >60 mL/min    Comment: (NOTE) Calculated using the CKD-EPI Creatinine Equation (2021)    Anion gap 12 5 - 15    Comment: Performed at [facility] [address], [place] [address]  CBC     Status: None   Collection Time: 10/06/21  2:05 PM  Result Value Ref Range   WBC 8.4 4.0 - 10.5  K/uL   RBC 4.55 4.22 - 5.81 MIL/uL   Hemoglobin 15.4 13.0 - 17.0 g/dL   HCT 45.2 39.0 - 52.0 %   MCV 99.3 80.0 - 100.0 fL   MCH 33.8 26.0 - 34.0 pg   MCHC 34.1 30.0 - 36.0 g/dL   RDW 13.3 11.5 - 15.5 %   Platelets 320 150 - 400 K/uL   nRBC 0.0 0.0 - 0.2 %    Comment: Performed at [facility], [place] [address], [place], [place] [address]  Troponin I (High Sensitivity)     Status: None   Collection Time: 10/06/21  2:21 PM  Result Value Ref Range   Troponin I (High Sensitivity) 6 <18 ng/L    Comment: (NOTE) Elevated high sensitivity troponin I (hsTnI) values and significant  changes across serial measurements may suggest ACS but many other  chronic and acute conditions are known to elevate hsTnI results.  Refer to the "Links" section for chest pain algorithms and additional  guidance. Performed at [facility], [place] [address] [place], [place] [address]   Hemoglobin A1c     Status: Abnormal   Collection Time: 10/06/21  2:21 PM  Result Value Ref Range   Hgb A1c MFr Bld 6.4 (H) 4.8 - 5.6 %    Comment: (NOTE) Pre diabetes:          5.7%-6.4%  Diabetes:              >6.4%  Glycemic control for   <7.0% adults with diabetes    Mean Plasma Glucose 136.98 mg/dL    Comment: Performed at [facility] [address], [place], [place] [address]  Lipid panel     Status: Abnormal   Collection Time: 10/06/21  2:21 PM  Result Value Ref Range   Cholesterol 200 0 - 200 mg/dL   Triglycerides 107 <150 mg/dL   HDL 45 >40 mg/dL   Total CHOL/HDL Ratio 4.4 RATIO   VLDL 21 0 - 40 mg/dL   LDL Cholesterol 134 (H) 0 - 99 mg/dL    Comment:        Total Cholesterol/HDL:CHD Risk Coronary Heart Disease Risk Table                     Men   Women  1/2 Average Risk   3.4   3.3  Average Risk       5.0   4.4  2 X Average Risk   9.6   7.1  3 X Average Risk  23.4   11.0  Use the calculated Patient Ratio above and the CHD Risk Table to determine the patient's CHD Risk.         ATP III CLASSIFICATION (LDL):  <100     mg/dL   Optimal  100-129  mg/dL   Near or Above                    Optimal  130-159  mg/dL   Borderline  160-189  mg/dL   High  >190     mg/dL   Very High Performed at [facility] [address] [place], [place] [address]   Troponin I (High Sensitivity)     Status: None   Collection Time: 10/06/21  6:44 PM  Result Value Ref Range   Troponin I (High Sensitivity) 8 <18 ng/L    Comment: (NOTE) Elevated high sensitivity troponin I (hsTnI) values and significant  changes across serial measurements may suggest ACS but many other  chronic and acute conditions are known to elevate hsTnI results.  Refer to the "Links" section for chest pain algorithms and additional  guidance. Performed at [facility], [place] [address] [place], [place] [address]    CT Head Wo Contrast  Result Date: 10/06/2021 CLINICAL DATA:  Syncope/presyncope, cerebrovascular cause suspected EXAM: CT HEAD WITHOUT CONTRAST TECHNIQUE: Contiguous axial images were obtained from the base of the skull through the vertex without intravenous contrast. RADIATION DOSE REDUCTION: This exam was performed according to the departmental dose-optimization program which includes automated exposure control, adjustment of the mA and/or kV according to patient size and/or use of iterative reconstruction technique. COMPARISON:  MRI 08/15/2019 FINDINGS: Brain: There is new loss of gray-white matter differentiation in the left frontal lobe consistent with acute infarct (series 3, image 22). There is encephalomalacia in the high left parietal cortex consistent with chronic infarct.No abnormal extra-axial collection. No concerning mass effect.No midline shift. The ventricular system is normal in size.Additional nonspecific areas of subcortical and periventricular hypoattenuation likely related to chronic small vessel ischemic disease. Vascular: No hyperdense vessel. Skull: Negative for skull fracture  given limitations of motion artifact. Sinuses/Orbits: Mild posterior left ethmoid air cell mucosal thickening. Other: None. IMPRESSION: Findings concerning for acute infarct in the left frontal lobe. No acute intracranial hemorrhage. Encephalomalacia in the high left parietal cortex consistent with old infarct. Critical Value/emergent results were called by telephone at the time of interpretation on 10/06/2021 at 3:28 pm to provider [facility] , who verbally acknowledged these results. Electronically Signed   By: Maurine Simmering M.D.   On: 10/06/2021 15:29    Pending Labs Unresulted Labs (From admission, onward)    None       Vitals/Pain Today's Vitals   10/06/21 1936 10/06/21 2000 10/06/21 2053 10/06/21 2054  BP: 124/76 (!) 146/78 (!) 129/92   Pulse: (!) 56 61 63 63  Resp: '15 19 13 13  '$ Temp:      TempSrc:      SpO2: 97% 100% 97% 97%    Isolation Precautions No active isolations  Medications Medications  gadobutrol (GADAVIST) 1 MMOL/ML injection 7.5 mL (7.5 mLs Intravenous Contrast Given 10/06/21 2032)    Mobility walks with person assist Low fall risk   Focused Assessments Cardiac Assessment Handoff:    No results found for: "CKTOTAL", "CKMB", "CKMBINDEX", "TROPONINI" No results found for: "DDIMER" Does the Patient currently have chest pain? No    R Recommendations: See Admitting Provider Note  Report given to:   Additional Notes: pt  is AAOx4. Pt is on room air.

## 2021-10-06 NOTE — ED Notes (Addendum)
Pt in room with family. Pt A&O x3. Reports had periods of bilateral hand tingling but since has resolved. Denies any pain. No HA/N/V/D/CP or SOB. IV attempted. No success.

## 2021-10-06 NOTE — ED Triage Notes (Signed)
Pt here with reports of being disoriented and having generalized weakness along with tingling in bil hands after having a syncopal event.

## 2021-10-07 ENCOUNTER — Inpatient Hospital Stay (HOSPITAL_COMMUNITY): Payer: Medicare PPO

## 2021-10-07 ENCOUNTER — Other Ambulatory Visit (HOSPITAL_COMMUNITY): Payer: Medicare PPO

## 2021-10-07 ENCOUNTER — Other Ambulatory Visit: Payer: Self-pay | Admitting: Cardiology

## 2021-10-07 DIAGNOSIS — R55 Syncope and collapse: Secondary | ICD-10-CM | POA: Diagnosis not present

## 2021-10-07 DIAGNOSIS — I639 Cerebral infarction, unspecified: Secondary | ICD-10-CM | POA: Diagnosis not present

## 2021-10-07 DIAGNOSIS — I6350 Cerebral infarction due to unspecified occlusion or stenosis of unspecified cerebral artery: Secondary | ICD-10-CM | POA: Diagnosis not present

## 2021-10-07 DIAGNOSIS — Z8673 Personal history of transient ischemic attack (TIA), and cerebral infarction without residual deficits: Secondary | ICD-10-CM | POA: Diagnosis not present

## 2021-10-07 DIAGNOSIS — E785 Hyperlipidemia, unspecified: Secondary | ICD-10-CM

## 2021-10-07 DIAGNOSIS — E118 Type 2 diabetes mellitus with unspecified complications: Secondary | ICD-10-CM

## 2021-10-07 DIAGNOSIS — R7303 Prediabetes: Secondary | ICD-10-CM

## 2021-10-07 DIAGNOSIS — I6389 Other cerebral infarction: Secondary | ICD-10-CM

## 2021-10-07 DIAGNOSIS — I4891 Unspecified atrial fibrillation: Secondary | ICD-10-CM

## 2021-10-07 LAB — RAPID URINE DRUG SCREEN, HOSP PERFORMED
Amphetamines: NOT DETECTED
Barbiturates: NOT DETECTED
Benzodiazepines: NOT DETECTED
Cocaine: NOT DETECTED
Opiates: NOT DETECTED
Tetrahydrocannabinol: NOT DETECTED

## 2021-10-07 LAB — ECHOCARDIOGRAM COMPLETE
AR max vel: 2.51 cm2
AV Area VTI: 1.98 cm2
AV Area mean vel: 2.1 cm2
AV Mean grad: 4 mmHg
AV Peak grad: 5.9 mmHg
Ao pk vel: 1.21 m/s
Area-P 1/2: 4.52 cm2
Calc EF: 40.1 %
Height: 65 in
S' Lateral: 3.8 cm
Single Plane A2C EF: 40.2 %
Single Plane A4C EF: 48.8 %
Weight: 2659.63 oz

## 2021-10-07 LAB — CBC
HCT: 41.1 % (ref 39.0–52.0)
Hemoglobin: 14.1 g/dL (ref 13.0–17.0)
MCH: 33.6 pg (ref 26.0–34.0)
MCHC: 34.3 g/dL (ref 30.0–36.0)
MCV: 97.9 fL (ref 80.0–100.0)
Platelets: 290 10*3/uL (ref 150–400)
RBC: 4.2 MIL/uL — ABNORMAL LOW (ref 4.22–5.81)
RDW: 13.2 % (ref 11.5–15.5)
WBC: 8.7 10*3/uL (ref 4.0–10.5)
nRBC: 0 % (ref 0.0–0.2)

## 2021-10-07 LAB — COMPREHENSIVE METABOLIC PANEL
ALT: 16 U/L (ref 0–44)
AST: 19 U/L (ref 15–41)
Albumin: 3.2 g/dL — ABNORMAL LOW (ref 3.5–5.0)
Alkaline Phosphatase: 52 U/L (ref 38–126)
Anion gap: 5 (ref 5–15)
BUN: 9 mg/dL (ref 8–23)
CO2: 24 mmol/L (ref 22–32)
Calcium: 8.5 mg/dL — ABNORMAL LOW (ref 8.9–10.3)
Chloride: 109 mmol/L (ref 98–111)
Creatinine, Ser: 0.9 mg/dL (ref 0.61–1.24)
GFR, Estimated: >60 mL/min (ref >60)
Glucose, Bld: 130 mg/dL — ABNORMAL HIGH (ref 70–99)
Potassium: 3.7 mmol/L (ref 3.5–5.1)
Sodium: 138 mmol/L (ref 135–145)
Total Bilirubin: 0.8 mg/dL (ref 0.3–1.2)
Total Protein: 6.3 g/dL — ABNORMAL LOW (ref 6.5–8.1)

## 2021-10-07 LAB — GLUCOSE, CAPILLARY
Glucose-Capillary: 128 mg/dL — ABNORMAL HIGH (ref 70–99)
Glucose-Capillary: 109 mg/dL — ABNORMAL HIGH (ref 70–99)
Glucose-Capillary: 126 mg/dL — ABNORMAL HIGH (ref 70–99)

## 2021-10-07 MED ORDER — ASPIRIN EC 81 MG PO TBEC
81.0000 mg | DELAYED_RELEASE_TABLET | Freq: Every day | ORAL | 3 refills | Status: AC
Start: 2021-10-07 — End: 2022-02-04

## 2021-10-07 MED ORDER — INSULIN ASPART 100 UNIT/ML IJ SOLN: 0.0000 [IU] | Freq: Three times a day (TID) | INTRAMUSCULAR | Status: DC

## 2021-10-07 MED ORDER — IOHEXOL 350 MG/ML SOLN
100.0000 mL | Freq: Once | INTRAVENOUS | Status: AC | PRN
  Administered 2021-10-07: 75 mL via INTRAVENOUS

## 2021-10-07 MED ORDER — ROSUVASTATIN CALCIUM 20 MG PO TABS
20.0000 mg | ORAL_TABLET | Freq: Every day | ORAL | Status: DC
  Administered 2021-10-07: 20 mg via ORAL
  Filled 2021-10-07: qty 1

## 2021-10-07 MED ORDER — CLOPIDOGREL BISULFATE 75 MG PO TABS
75.0000 mg | ORAL_TABLET | Freq: Every day | ORAL | Status: DC
  Administered 2021-10-07: 75 mg via ORAL
  Filled 2021-10-07: qty 1

## 2021-10-07 MED ORDER — CLOPIDOGREL BISULFATE 75 MG PO TABS: 75.0000 mg | ORAL_TABLET | Freq: Every day | ORAL | 3 refills | Status: DC

## 2021-10-07 MED ORDER — ROSUVASTATIN CALCIUM 20 MG PO TABS: 20.0000 mg | ORAL_TABLET | Freq: Every day | ORAL | 11 refills | Status: DC

## 2021-10-07 MED ORDER — ASPIRIN 81 MG PO TBEC
81.0000 mg | DELAYED_RELEASE_TABLET | Freq: Every day | ORAL | Status: DC
  Administered 2021-10-07: 81 mg via ORAL
  Filled 2021-10-07: qty 1

## 2021-10-07 NOTE — Evaluation (Signed)
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 male with medical history significant for CAD with CABG in 2017, history of CVA in the 1980s, peripheral arterial disease, and prediabetes who presents emergency department after syncopal episode yesterday.  The patient reports that he was having a normal day and doing some work outside when he bent over to start his weedeater and had a brief loss of consciousness.  No appreciable injury and he returned to his usual state fairly quickly but had not told his wife what happened until earlier today and she urged him to seek evaluation in the ED.  He denies any focal numbness or weakness at this time.  He relates that he had an episode in [place] approximately 3 weeks ago where he had been working outside in the heat and then felt disoriented and had some dizziness.  The symptoms had since resolved; MRI brain on 10/06/21 indicated 4.7 cm acute to early subacute left MCA distribution infarct. No  associated hemorrhage or significant mass effect.  2. No other  acute intracranial abnormality.  3. 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   Assessment / Plan / Recommendation Clinical Impression  Pt assessed via [facility] Mental Status Examination (SLUMS) with a score of 16/30 obtained with a typical score on this assessment being 27/30 or above.  Deficits included attention, memory and organization/reasoning and problem solving.  Pt had difficulty with repeating digits backwards, recalling all information from simple paragraph (50% accurate) and recalling objects after a time delay without categorization cue given.  He recalled all objects with cues, but only 1/5 without min cueing from SLP.  Pt also exhibited difficulty organizing infomration for a word fluency task with a simple category.  Pt was oriented  x4 and speech was intelligible within complex cnnversation.  OME grossly within normal limits.  ST will f/u while in acute setting for cognitive deficits.  Thank you for this consult.    SLP Assessment  SLP Recommendation/Assessment: Patient needs continued Speech Language Pathology Services SLP Visit Diagnosis: Attention and concentration deficit;Cognitive communication deficit (R41.841) Attention and concentration deficit following: Cerebral infarction    Recommendations for follow up therapy are one component of a multi-disciplinary discharge planning process, led by the attending physician.  Recommendations may be updated based on patient status, additional functional criteria and insurance authorization.    Follow Up Recommendations  Outpatient SLP    Assistance Recommended at Discharge  Intermittent Supervision/Assistance  Functional Status Assessment Patient has had a  recent decline in their functional status and demonstrates the ability to make significant improvements in function in a reasonable and predictable amount of time.  Frequency and Duration min 2x/week  1 week      SLP  Evaluation Cognition  Overall Cognitive Status: Impaired/Different from baseline Arousal/Alertness: Awake/alert Orientation Level: Oriented X4 Year: 2023 Month: June Day of Week: Correct Attention: Sustained Sustained Attention: Impaired Sustained Attention Impairment: Verbal basic;Functional basic Memory: Impaired Memory Impairment: Retrieval deficit;Decreased recall of new information;Decreased short term memory Decreased Short Term Memory: Verbal basic;Functional basic Awareness: Appears intact Problem Solving: Impaired Problem Solving Impairment: Verbal basic;Functional basic Executive Function: Sequencing;Reasoning;Organizing Reasoning: Impaired Reasoning Impairment: Verbal basic;Functional basic Sequencing: Impaired Sequencing Impairment: Verbal basic;Functional basic Organizing: Impaired Organizing Impairment: Verbal basic;Functional basic Behaviors: Perseveration Safety/Judgment: Impaired       Comprehension  Auditory Comprehension Overall Auditory Comprehension: Other (comment) (DTA d/t cog impairment) Conversation: Complex Interfering Components: Working Marine scientist;Attention EffectiveTechniques: Repetition Visual Recognition/Discrimination Discrimination: Not tested Reading Comprehension Reading Status: Within funtional limits (for environmental signs only; had recent eye surgery for cataract on R and additional sx for L eye)    Expression Expression Primary Mode of Expression: Verbal Verbal Expression Overall Verbal Expression: Appears within functional limits for tasks assessed Initiation: No impairment Level of Generative/Spontaneous Verbalization: Conversation Naming: Not tested Interfering Components: Attention Non-Verbal Means of Communication: Not applicable Written Expression Dominant Hand: Right Written Expression: Not tested   Oral / Motor  Oral Motor/Sensory Function Overall Oral Motor/Sensory Function: Within functional limits Motor  Speech Overall Motor Speech: Appears within functional limits for tasks assessed Respiration: Within functional limits Phonation: Normal Resonance: Within functional limits Articulation: Within functional limitis Intelligibility: Intelligible Motor Planning: Witnin functional limits Motor Speech Errors: Not applicable            Elvina Sidle, M.S., CCC-SLP 10/07/2021, 3:21 PM

## 2021-10-07 NOTE — Assessment & Plan Note (Signed)
Follow outpatient  

## 2021-10-07 NOTE — Evaluation (Signed)
Physical Therapy Evaluation  Patient Details Name: [name] [name] MRN: [id number] DOB: [birth year] Today's Date: 10/07/2021  History of Present Illness  Pt is a 76 y/o male presenting on 6/28 with tingling in bil hands/weakness after syncopal event. 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  PMH includes: CAD with CABG 2017, CVA, PAD, prediabetes.   Clinical Impression  Pt admitted with above diagnosis. Pt currently with functional limitations due to the deficits listed below (see PT Problem List). At the time of PT eval pt was able to perform transfers and ambulation with gross min guard assist to min assist and no AD. Pt scored 14/24 on the DGI, indicating pt is at an increased risk for falls. Wife reports pt appears more unsteady than normal. Recommend follow up at a neuro outpatient rehab clinic to maximize functional independence and safety, and to decrease risk for falls. Pt will benefit from skilled PT to increase their independence and safety with mobility to allow discharge to the venue listed below.          Recommendations for follow up therapy are one component of a multi-disciplinary discharge planning process, led by the attending physician.  Recommendations may be updated based on patient status, additional functional criteria and insurance authorization.  Follow Up Recommendations Outpatient PT      Assistance Recommended at Discharge Intermittent Supervision/Assistance  Patient can return home with the following  A little help with walking and/or transfers;A little help with bathing/dressing/bathroom;Assistance with cooking/housework;Assist for transportation;Help with stairs or ramp for entrance;Direct supervision/assist for medications management    Equipment Recommendations None recommended by PT  Recommendations for Other Services       Functional Status Assessment Patient has had a recent decline in their functional status and  demonstrates the ability to make significant improvements in function in a reasonable and predictable amount of time.     Precautions / Restrictions Precautions Precautions: Fall Restrictions Weight Bearing Restrictions: No      Mobility  Bed Mobility Overal bed mobility: Modified Independent             General bed mobility comments: HOB elevated but no assist required for pt to roll over and sit up EOB.    Transfers Overall transfer level: Needs assistance Equipment used: None Transfers: Sit to/from Stand Sit to Stand: Min guard, Supervision           General transfer comment: min guard fading to supervision by end of session. No gross unsteadiness noted.    Ambulation/Gait Ambulation/Gait assistance: Min guard, Min assist Gait Distance (Feet): 200 Feet Assistive device: None Gait Pattern/deviations: Step-through pattern, Decreased stride length, Trunk flexed, Drifts right/left, Antalgic Gait velocity: Decreased Gait velocity interpretation: 1.31 - 2.62 ft/sec, indicative of limited community ambulator   General Gait Details: Pt tolerated ambulation in the hallway well and for completion of DGI. Pt drifting at times and wife reports he appears more unsteady than normal, even though his "limp" is baseline from prior stroke.  Stairs Stairs: Yes Stairs assistance: Min guard Stair Management: Two rails, Alternating pattern, Forwards Number of Stairs: 5 General stair comments: Stairs as part of the DGI. No assist required however hands on guarding provided for safety.  Wheelchair Mobility    Modified Rankin (Stroke Patients Only) Modified Rankin (Stroke Patients Only) Pre-Morbid Rankin Score: No significant disability Modified Rankin: Moderately severe disability     Balance Overall balance assessment: Needs assistance Sitting-balance support: Feet supported, No upper extremity supported Sitting balance-Leahy Scale:  Fair     Standing balance support: No  upper extremity supported, During functional activity Standing balance-Leahy Scale: Fair                   Standardized Balance Assessment Standardized Balance Assessment : Dynamic Gait Index   Dynamic Gait Index Level Surface: Moderate Impairment Change in Gait Speed: Mild Impairment Gait with Horizontal Head Turns: Mild Impairment Gait with Vertical Head Turns: Mild Impairment Gait and Pivot Turn: Normal Step Over Obstacle: Severe Impairment Step Around Obstacles: Mild Impairment Steps: Mild Impairment Total Score: 14       Pertinent Vitals/Pain Pain Assessment Pain Assessment: No/denies pain    Home Living Family/patient expects to be discharged to:: Private residence Living Arrangements: Spouse/significant other Available Help at Discharge: Family;Available 24 hours/day Type of Home: House Home Access: Stairs to enter Entrance Stairs-Rails: Can reach both Entrance Stairs-Number of Steps: 5   Home Layout: One level Home Equipment: None      Prior Function Prior Level of Function : Independent/Modified Independent;Driving                     Hand Dominance   Dominant Hand: Right    Extremity/Trunk Assessment   Upper Extremity Assessment Upper Extremity Assessment: Overall WFL for tasks assessed (Baseline strength is greater in RUE from prior stroke.)    Lower Extremity Assessment Lower Extremity Assessment: Overall WFL for tasks assessed (Baseline strength greater in RUE from prior stroke)    Cervical / Trunk Assessment Cervical / Trunk Assessment: Other exceptions Cervical / Trunk Exceptions: Forward head posture with rounded shoulders  Communication   Communication: No difficulties  Cognition Arousal/Alertness: Awake/alert Behavior During Therapy: Flat affect Overall Cognitive Status: Impaired/Different from baseline Area of Impairment: Attention, Problem solving, Awareness                   Current Attention Level:  Sustained       Awareness: Emergent Problem Solving: Difficulty sequencing, Requires verbal cues, Slow processing General Comments: pt reports dificulty with attention and sequencing, but demonstrates diffulty with awareness to level of deficits and how they will affect his independence.        General Comments      Exercises     Assessment/Plan    PT Assessment Patient needs continued PT services  PT Problem List Decreased strength;Decreased activity tolerance;Decreased balance;Decreased mobility;Decreased coordination;Decreased cognition;Decreased safety awareness;Decreased knowledge of precautions       PT Treatment Interventions DME instruction;Gait training;Stair training;Functional mobility training;Therapeutic activities;Therapeutic exercise;Neuromuscular re-education;Patient/family education;Balance training;Cognitive remediation    PT Goals (Current goals can be found in the Care Plan section)  Acute Rehab PT Goals Patient Stated Goal: Home today PT Goal Formulation: With patient/family Time For Goal Achievement: 10/14/21 Potential to Achieve Goals: Good Additional Goals Additional Goal #1: Pt will score 19 or greater on DGI without an AD.    Frequency Min 4X/week     Co-evaluation               AM-PAC PT "6 Clicks" Mobility  Outcome Measure Help needed turning from your back to your side while in a flat bed without using bedrails?: None Help needed moving from lying on your back to sitting on the side of a flat bed without using bedrails?: None Help needed moving to and from a bed to a chair (including a wheelchair)?: A Little Help needed standing up from a chair using your arms (e.g., wheelchair or bedside chair)?: A Little  Help needed to walk in hospital room?: A Little Help needed climbing 3-5 steps with a railing? : A Little 6 Click Score: 20    End of Session Equipment Utilized During Treatment: Gait belt Activity Tolerance: Patient tolerated  treatment well Patient left: in chair;with call bell/phone within reach;with chair alarm set;with family/visitor present Nurse Communication: Mobility status PT Visit Diagnosis: Unsteadiness on feet (R26.81);Other abnormalities of gait and mobility (R26.89)    Time: 8343-7357 PT Time Calculation (min) (ACUTE ONLY): 24 min   Charges:   PT Evaluation $PT Eval Moderate Complexity: 1 Mod PT Treatments $Gait Training: 8-22 mins        Rolinda Roan, PT, DPT Acute Rehabilitation Services Secure Chat Preferred Office: [phone]   Thelma Comp 10/07/2021, 4:01 PM

## 2021-10-07 NOTE — Progress Notes (Signed)
Echocardiogram 2D Echocardiogram has been performed.  Joette Catching 10/07/2021, 9:03 AM

## 2021-10-07 NOTE — Progress Notes (Signed)
STROKE TEAM PROGRESS NOTE   SUBJECTIVE (INTERVAL HISTORY) His wife is at the bedside.  Overall his condition is stable. Pt stated that 6/27 he was in the yard, bending down and then passed out. He woke up by himself and stated not too long. He denies focal deficit. He had a near syncope episode earlier this month in [place], and he felt some dizziness, no chest pain and almost passed out but he sat down and resolved. 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 He still has some left foot drop as of now.    OBJECTIVE Temp:  [97.8 F (36.6 C)-98.6 F (37 C)] 97.8 F (36.6 C) (06/29 1203) Pulse Rate:  [52-65] 59 (06/29 1203) Cardiac Rhythm: Normal sinus rhythm (06/29 0734) Resp:  [13-19] 16 (06/29 1203) BP: (108-146)/(63-92) 110/65 (06/29 1203) SpO2:  [94 %-100 %] 94 % (06/29 1203) Weight:  [75.4 kg] 75.4 kg (06/28 2333)  Recent Labs  Lab 10/06/21 1401 10/07/21 0631 10/07/21 1204  GLUCAP 183* 128* 109*   Recent Labs  Lab 10/06/21 1405 10/07/21 0412  NA 141 138  K 4.0 3.7  CL 106 109  CO2 23 24  GLUCOSE 179* 130*  BUN 11 9  CREATININE 0.94 0.90  CALCIUM 9.3 8.5*   Recent Labs  Lab 10/07/21 0412  AST 19  ALT 16  ALKPHOS 52  BILITOT 0.8  PROT 6.3*  ALBUMIN 3.2*   Recent Labs  Lab 10/06/21 1405 10/07/21 0412  WBC 8.4 8.7  HGB 15.4 14.1  HCT 45.2 41.1  MCV 99.3 97.9  PLT 320 290   No results for input(s): "CKTOTAL", "CKMB", "CKMBINDEX", "TROPONINI" in the last 168 hours. No results for input(s): "LABPROT", "INR" in the last 72 hours. Recent Labs    10/06/21 1354  COLORURINE YELLOW  LABSPEC 1.016  PHURINE 5.0  GLUCOSEU NEGATIVE  HGBUR NEGATIVE  BILIRUBINUR NEGATIVE  KETONESUR NEGATIVE  PROTEINUR NEGATIVE  NITRITE NEGATIVE  LEUKOCYTESUR NEGATIVE       Component Value Date/Time   CHOL 200 10/06/2021 1421   CHOL 144 11/15/2016 0929   TRIG 107 10/06/2021 1421   HDL 45 10/06/2021 1421   HDL 46 11/15/2016 0929    CHOLHDL 4.4 10/06/2021 1421   VLDL 21 10/06/2021 1421   LDLCALC 134 (H) 10/06/2021 1421   LDLCALC 84 11/15/2016 0929   Lab Results  Component Value Date   HGBA1C 6.4 (H) 10/06/2021   No results found for: "LABOPIA", "COCAINSCRNUR", "LABBENZ", "AMPHETMU", "THCU", "LABBARB"  No results for input(s): "ETH" in the last 168 hours.  I have personally reviewed the radiological images below and agree with the radiology interpretations.  ECHOCARDIOGRAM COMPLETE  Result Date: 10/07/2021    ECHOCARDIOGRAM REPORT   Patient Name:   [name] [name] Date of Exam: 10/07/2021 Medical Rec #:  [id number]    Height:       65.0 in Accession #:    [id number]   Weight:       166.2 lb Date of Birth:  07-23-1945    BSA:          1.829 m Patient Age:    76 years     BP:           114/65 mmHg Patient Gender: M            HR:           57 bpm. Exam Location:  Inpatient Procedure: 2D Echo, Color Doppler and  Cardiac Doppler Indications:    Stroke  History:        Patient has prior history of Echocardiogram examinations, most                 recent 10/03/2016. CAD, Prior CABG; PAD.  Sonographer:    Joette Catching RCS Referring Phys: [phone] [place]  1. Left ventricular ejection fraction, by estimation, is 50 to 55%. The left ventricle has low normal function. The left ventricle demonstrates regional wall motion abnormalities (see scoring diagram/findings for description). Left ventricular diastolic  parameters are consistent with Grade I diastolic dysfunction (impaired relaxation). There is hypokinesis of the left ventricular, apical inferior wall.  2. Right ventricular systolic function is normal. The right ventricular size is normal. There is normal pulmonary artery systolic pressure. The estimated right ventricular systolic pressure is 64.3 mmHg.  3. The mitral valve is normal in structure. Trivial mitral valve regurgitation. No evidence of mitral stenosis.  4. The aortic valve is tricuspid. Aortic valve  regurgitation is not visualized. Aortic valve sclerosis/calcification is present, without any evidence of aortic stenosis. Aortic valve area, by VTI measures 1.98 cm. Aortic valve mean gradient measures 4.0 mmHg. Aortic valve Vmax measures 1.21 m/s.  5. The inferior vena cava is normal in size with greater than 50% respiratory variability, suggesting right atrial pressure of 3 mmHg.  6. Recommend repeat limited study with definity contrast to define wall motion FINDINGS  Left Ventricle: Left ventricular ejection fraction, by estimation, is 50 to 55%. The left ventricle has low normal function. The left ventricle demonstrates regional wall motion abnormalities. The left ventricular internal cavity size was normal in size. There is no left ventricular hypertrophy. Left ventricular diastolic parameters are consistent with Grade I diastolic dysfunction (impaired relaxation). Normal left ventricular filling pressure. Right Ventricle: The right ventricular size is normal. No increase in right ventricular wall thickness. Right ventricular systolic function is normal. There is normal pulmonary artery systolic pressure. The tricuspid regurgitant velocity is 2.27 m/s, and  with an assumed right atrial pressure of 3 mmHg, the estimated right ventricular systolic pressure is 32.9 mmHg. Left Atrium: Left atrial size was normal in size. Right Atrium: Right atrial size was normal in size. Pericardium: There is no evidence of pericardial effusion. Mitral Valve: The mitral valve is normal in structure. Trivial mitral valve regurgitation. No evidence of mitral valve stenosis. Tricuspid Valve: The tricuspid valve is normal in structure. Tricuspid valve regurgitation is mild . No evidence of tricuspid stenosis. Aortic Valve: The aortic valve is tricuspid. Aortic valve regurgitation is not visualized. Aortic valve sclerosis/calcification is present, without any evidence of aortic stenosis. Aortic valve mean gradient measures 4.0 mmHg.  Aortic valve peak gradient measures 5.9 mmHg. Aortic valve area, by VTI measures 1.98 cm. Pulmonic Valve: The pulmonic valve was normal in structure. Pulmonic valve regurgitation is trivial. No evidence of pulmonic stenosis. Aorta: The aortic root is normal in size and structure. Venous: The inferior vena cava is normal in size with greater than 50% respiratory variability, suggesting right atrial pressure of 3 mmHg. IAS/Shunts: No atrial level shunt detected by color flow Doppler.  LEFT VENTRICLE PLAX 2D LVIDd:         4.60 cm     Diastology LVIDs:         3.80 cm     LV e' medial:    5.34 cm/s LV PW:         0.90 cm     LV E/e'  medial:  13.2 LV IVS:        1.10 cm     LV e' lateral:   5.43 cm/s LVOT diam:     2.00 cm     LV E/e' lateral: 13.0 LV SV:         56 LV SV Index:   31 LVOT Area:     3.14 cm  LV Volumes (MOD) LV vol d, MOD A2C: 93.7 ml LV vol d, MOD A4C: 81.3 ml LV vol s, MOD A2C: 56.0 ml LV vol s, MOD A4C: 41.6 ml LV SV MOD A2C:     37.7 ml LV SV MOD A4C:     81.3 ml LV SV MOD BP:      36.0 ml RIGHT VENTRICLE            IVC RV S prime:     7.62 cm/s  IVC diam: 1.20 cm TAPSE (M-mode): 1.4 cm LEFT ATRIUM             Index        RIGHT ATRIUM           Index LA diam:        3.30 cm 1.80 cm/m   RA Area:     13.00 cm LA Vol (A2C):   33.8 ml 18.48 ml/m  RA Volume:   35.30 ml  19.30 ml/m LA Vol (A4C):   38.4 ml 21.00 ml/m LA Biplane Vol: 39.2 ml 21.44 ml/m  AORTIC VALVE                    PULMONIC VALVE AV Area (Vmax):    2.51 cm     PV Vmax:          0.94 m/s AV Area (Vmean):   2.10 cm     PV Peak grad:     3.5 mmHg AV Area (VTI):     1.98 cm     PR End Diast Vel: 2.84 msec AV Vmax:           121.00 cm/s AV Vmean:          92.600 cm/s AV VTI:            0.282 m AV Peak Grad:      5.9 mmHg AV Mean Grad:      4.0 mmHg LVOT Vmax:         96.60 cm/s LVOT Vmean:        62.000 cm/s LVOT VTI:          0.178 m LVOT/AV VTI ratio: 0.63  AORTA Ao Root diam: 3.40 cm Ao Asc diam:  3.40 cm MITRAL VALVE                TRICUSPID VALVE MV Area (PHT): 4.52 cm    TR Peak grad:   20.6 mmHg MV Decel Time: 168 msec    TR Vmax:        227.00 cm/s MV E velocity: 70.70 cm/s MV A velocity: 92.50 cm/s  SHUNTS MV E/A ratio:  0.76        Systemic VTI:  0.18 m                            Systemic Diam: 2.00 cm [name] Him MD Electronically signed by [name] Him MD Signature Date/Time: 10/07/2021/9:12:01 AM    Final    CT ANGIO HEAD NECK W WO CM  Result Date:  10/07/2021 CLINICAL DATA:  Follow-up examination for acute stroke. EXAM: CT ANGIOGRAPHY HEAD AND NECK TECHNIQUE: Multidetector CT imaging of the head and neck was performed using the standard protocol during bolus administration of intravenous contrast. Multiplanar CT image reconstructions and MIPs were obtained to evaluate the vascular anatomy. Carotid stenosis measurements (when applicable) are obtained utilizing NASCET criteria, using the distal internal carotid diameter as the denominator. RADIATION DOSE REDUCTION: This exam was performed according to the departmental dose-optimization program which includes automated exposure control, adjustment of the mA and/or kV according to patient size and/or use of iterative reconstruction technique. CONTRAST:  65m OMNIPAQUE IOHEXOL 350 MG/ML SOLN COMPARISON:  Prior CT and MRI from 10/06/2021. FINDINGS: CTA NECK FINDINGS Aortic arch: Visualized aortic arch normal in caliber with standard branching pattern. Mild atheromatous change about the origin of the left subclavian artery. No significant stenosis about the origin of the great vessels. Right carotid system: Right CCA widely patent proximally. Eccentric soft plaque within the mid cervical ICA without significant stenosis. Mild mixed plaque about the right carotid bulb without stenosis. Right ICA patent distally without stenosis or dissection. Left carotid system: Left CCA widely patent proximally. Soft concentric plaque about the mid cervical ICA without hemodynamically significant  stenosis. Mild calcified plaque about the left carotid bulb/proximal left ICA without stenosis. Left ICA patent distally without stenosis or dissection. Vertebral arteries: Both vertebral arteries arise from subclavian arteries. No significant proximal subclavian artery stenosis. Atheromatous change seen about the proximal V1 segments bilaterally with associated moderate to severe stenoses (series 6, images 80, 92 on the left, image 94 on the right). Vertebral arteries otherwise patent distally without stenosis or dissection. Right vertebral artery dominant. Skeleton: No discrete or worrisome osseous lesions. Moderate spondylosis present at C5-6 and C6-7 without significant stenosis. Median sternotomy wires noted. Other neck: No other acute soft tissue abnormality within the neck. Upper chest: Scattered atelectatic changes with mild pulmonary interstitial congestion noted within the visualized lungs. Visualized upper chest demonstrates no other acute finding. Review of the MIP images confirms the above findings CTA HEAD FINDINGS Anterior circulation: Petrous segments patent bilaterally without significant stenosis. Advanced atheromatous change within the carotid siphons with associated moderate to severe multifocal narrowing. A1 segments patent bilaterally. Normal anterior communicating artery complex. Both ACAs patent to their distal aspects without stenosis. No M1 stenosis or occlusion. No proximal MCA branch occlusion. Distal MCA branches well perfused and fairly symmetric. Posterior circulation: Atheromatous change about the proximal V4 segments bilaterally with moderate stenoses. Right PICA patent. Left PICA not well seen. Basilar irregular but patent without significant stenosis. Superior cerebral arteries patent bilaterally. Left PCA supplied via the basilar. Fetal type origin of the right PCA. Both PCAs patent to their distal aspects without significant stenosis. Venous sinuses: Grossly patent allowing for  timing the contrast bolus. Anatomic variants: Fetal type right PCA.  No aneurysm. Review of the MIP images confirms the above findings IMPRESSION: 1. Negative CTA for large vessel occlusion or other emergent finding. 2. Advanced atheromatous change about the carotid siphons with associated moderate to severe multifocal narrowing. 3. Advanced atheromatous change about the proximal V1 segments bilaterally with associated moderate to severe stenoses. Right vertebral artery dominant. Electronically Signed   By: BJeannine BogaM.D.   On: 10/07/2021 04:52   MR BRAIN W WO CONTRAST  Result Date: 10/06/2021 CLINICAL DATA:  Initial evaluation for acute stroke. EXAM: MRI HEAD WITHOUT AND WITH CONTRAST TECHNIQUE: Multiplanar, multiecho pulse sequences of the brain and surrounding structures were obtained  without and with intravenous contrast. CONTRAST:  7.3m GADAVIST GADOBUTROL 1 MMOL/ML IV SOLN COMPARISON:  Prior CT from earlier the same day as well as previous MRI from 08/15/2019. FINDINGS: Brain: Age-related cerebral atrophy. Patchy T2/FLAIR hyperintensity involving the periventricular deep white matter both cerebral hemispheres, most consistent with chronic small vessel ischemic disease, mild in nature. Encephalomalacia and gliosis at the left parietal lobe consistent with a chronic ischemic infarct. Remote lacunar infarct present at the right pons. Confluent restricted diffusion involving the cortical and subcortical left frontal lobe measuring up to 4.7 cm, consistent with acute to early subacute left MCA distribution infarct. No significant associated blood products. No mass effect. Associated mild patchy post-contrast enhancement noted. No other evidence for acute or subacute ischemia. Gray-white matter differentiation otherwise maintained. Minimal chronic hemosiderin staining noted about the chronic left parietal infarct. No other acute or chronic intracranial blood products. No mass lesion, midline shift or  mass effect no hydrocephalus or extra-axial fluid collection. Pituitary gland suprasellar region normal. No other abnormal enhancement. Vascular: Major intracranial vascular flow voids are maintained. Skull and upper cervical spine: Craniocervical junction within normal limits. Bone marrow signal intensity within normal limits. No scalp soft tissue abnormality. Sinuses/Orbits: Prior bilateral ocular lens replacement. Scattered mucosal thickening noted throughout the paranasal sinuses. No significant mastoid effusion. Other: None. IMPRESSION: 1. 4.7 cm acute to early subacute left MCA distribution infarct. No associated hemorrhage or significant mass effect. 2. No other acute intracranial abnormality. 3. 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 4. Underlying age-related cerebral atrophy with mild chronic small vessel ischemic disease. Electronically Signed   By: BJeannine BogaM.D.   On: 10/06/2021 23:07   CT Head Wo Contrast  Result Date: 10/06/2021 CLINICAL DATA:  Syncope/presyncope, cerebrovascular cause suspected EXAM: CT HEAD WITHOUT CONTRAST TECHNIQUE: Contiguous axial images were obtained from the base of the skull through the vertex without intravenous contrast. RADIATION DOSE REDUCTION: This exam was performed according to the departmental dose-optimization program which includes automated exposure control, adjustment of the mA and/or kV according to patient size and/or use of iterative reconstruction technique. COMPARISON:  MRI 08/15/2019 FINDINGS: Brain: There is new loss of gray-white matter differentiation in the left frontal lobe consistent with acute infarct (series 3, image 22). There is encephalomalacia in the high left parietal cortex consistent with chronic infarct.No abnormal extra-axial collection. No concerning mass effect.No midline shift. The ventricular system is normal in size.Additional nonspecific areas of subcortical and periventricular hypoattenuation likely related to  chronic small vessel ischemic disease. Vascular: No hyperdense vessel. Skull: Negative for skull fracture given limitations of motion artifact. Sinuses/Orbits: Mild posterior left ethmoid air cell mucosal thickening. Other: None. IMPRESSION: Findings concerning for acute infarct in the left frontal lobe. No acute intracranial hemorrhage. Encephalomalacia in the high left parietal cortex consistent with old infarct. Critical Value/emergent results were called by telephone at the time of interpretation on 10/06/2021 at 3:28 pm [facility] verbally acknowledged these results. Electronically Signed   By: JMaurine SimmeringM.D.   On: 10/06/2021 15:29     PHYSICAL EXAM  Temp:  [97.8 F (36.6 C)-98.6 F (37 C)] 97.8 F (36.6 C) (06/29 1203) Pulse Rate:  [52-65] 59 (06/29 1203) Resp:  [13-19] 16 (06/29 1203) BP: (108-146)/(63-92) 110/65 (06/29 1203) SpO2:  [94 %-100 %] 94 % (06/29 1203) Weight:  [75.4 kg] 75.4 kg (06/28 2333)  General - Well nourished, well developed, in no apparent distress.  Ophthalmologic - fundi not visualized due to noncooperation.  Cardiovascular - Regular rhythm and rate.  Mental Status -  Level of arousal and orientation to time, place, and person were intact. Language including expression, naming, repetition, comprehension was assessed and found intact. Fund of Knowledge was assessed and was intact.  Cranial Nerves II - XII - II - Visual field intact OU. III, IV, VI - Extraocular movements intact. V - Facial sensation intact bilaterally. VII - Facial movement intact bilaterally. VIII - Hearing & vestibular intact bilaterally. X - Palate elevates symmetrically. XI - Chin turning & shoulder shrug intact bilaterally. XII - Tongue protrusion intact.  Motor Strength - The patient's strength was normal in all extremities except left foot DF/PF 4/5 and pronator drift was absent.  Bulk was normal and fasciculations were absent.   Motor Tone - Muscle tone was  assessed at the neck and appendages and was normal.  Reflexes - The patient's reflexes were symmetrical in all extremities and he had no pathological reflexes.  Sensory - Light touch, temperature/pinprick were assessed and were symmetrical.    Coordination - The patient had normal movements in the hands with no ataxia or dysmetria.  Tremor was absent.  Gait and Station - deferred.   ASSESSMENT/PLAN Mr. Kraven Calk is a 76 y.o. 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 No tPA given due to resolved.    Stroke:  left frontal acute infarct likely secondary to severe left ICA siphon stenosis. However, cardioembolic source can not be completely ruled out CT left frontal acute infarct, old left frontoparietal infarct. CTA head and neck b/l siphon severe stenosis, L>R, left V1 moderate to severe stenosis MRI  left frontal acute infarct, chronic left frontoparietal infarct. No right sided chronic infarct seen to explain pt stroke 40 years ago with left sided residue.  2D Echo  EF 50-55% Recommend 30 day cardiac event monitoring as outpt to rule out arrhythmia.  LDL 134 HgbA1c 6.4 lovenox for VTE prophylaxis aspirin 81 mg and clopidogrel 75 mg alternating every other day prior to admission, now on aspirin 81 mg daily and clopidogrel 75 mg daily DAPT for 3 months given left ICA siphon high grade stenosis and then either ASA or plavix per cardiology. Patient counseled to be compliant with his antithrombotic medications Ongoing aggressive stroke risk factor management Therapy recommendations:  outpt PT/OT Disposition:  home  Syncope  6/27 syncope Earlier this month near-syncope EF 50-55% with hypokinesis of the left ventricular, apical inferior wall.  Limited echo pending Recommend 30 day cardiac event monitoring as outpt to rule out arrhythmia.   Diabetes HgbA1c 6.4 goal < 7.0 Controlled CBG monitoring SSI DM  education and close PCP follow up  Hypertension Stable Long term BP goal normotensive  Hyperlipidemia Home meds:  none  LDL 134, goal < 70 Now on crestor 20 Continue statin at discharge  Other Stroke Risk Factors Advanced age Hx stroke/TIA - 40 years ago with residue left foot mild drop. However, MRI did not show right sided chronic infarct Coronary artery disease s/p CABG PVD follows with Dr. Gwenlyn Found  Other Active Problems   Hospital day # 1  Neurology will sign off. Please call with questions. Pt will follow up with Dr. Tomi Likens at [facility] in about 4 weeks. Thanks for the consult.   Rosalin Hawking, MD PhD Stroke Neurology 10/07/2021 2:09 PM    To contact Stroke Continuity provider, please refer to [email or web address] After hours, contact General Neurology

## 2021-10-07 NOTE — Assessment & Plan Note (Signed)
Residual L sided weakness

## 2021-10-07 NOTE — Care Management CC44 (Signed)
Condition Code 44 Documentation Completed  Patient Details  Name: Lenoard Helbert MRN: [id number] Date of Birth: 1945/08/16   Condition Code 44 given:  Yes Patient signature on Condition Code 44 notice:  Yes Documentation of 2 MD's agreement:  Yes Code 44 added to claim:  Yes    Ninfa Meeker, RN 10/07/2021, 2:36 PM

## 2021-10-07 NOTE — Assessment & Plan Note (Signed)
Hx CABG in 2017

## 2021-10-07 NOTE — Assessment & Plan Note (Addendum)
Pending orthostatics (most likely given working outside, bending over?) Echo with EF 50-55%, RWMA, grade 1 diastolic dysfunction -> cards recommended repeat limited study with definity contrast (pending) Repeat limited echo won't be completed before 6/30, I don't think this should delay his discharge.  Would recommend outpatient follow up for this (discussed with cards).  Follow outpatient. Planning for 30 day event monitor outpatient

## 2021-10-07 NOTE — Hospital Course (Signed)
[name] [name] is [name] [name] pleasant 76 y.o. male with medical history significant for CAD with CABG in 2017, history of CVA in the 1980s, peripheral arterial disease, and prediabetes who presents emergency department after syncopal episode yesterday.  The patient reports that he was having [name] [name] normal day and doing some work outside when he bent over to start his weedeater and had [name] [name] brief loss of consciousness.  No appreciable injury and he returned to his usual state fairly quickly but had not told his wife what happened until earlier today and she urged him to seek evaluation in the ED.  He denies any focal numbness or weakness at this time.  He relates that he had an episode in [place] approximately 3 weeks ago where he had been working outside in the heat and then felt disoriented and had some dizziness.  The symptoms had since resolved.   He is no longer taking [name] [name] statin and has been taking ASA 81 mg and Plavix 75 mg on alternating days.    ED Course: Upon arrival to the ED, patient is found to be afebrile and saturating well on room air with heart rate in the 50s to 60s and stable blood pressure.  EKG features sinus bradycardia with rate 56.  Head CT concerning for acute infarction in the left frontal lobe without hemorrhage.  MRI brain reveals 4.7 cm acute to early subacute left MCA distribution infarction without hemorrhage or mass effect.  Neurology was consulted by the ED physician and recommended medical admission for stroke work-up  He was admitted for stroke workup.  Neurology suspects left frontal acute infarct due to severe L ICA siphon stenosis (cardioembolic source can't be ruled out).  Recommending DAPT x3 months, then aspirin or plavix per cardiology outpatient.  Will request 30 day event monitor by cardiology.  Outpatient neurology follow up.  See below for additional details

## 2021-10-07 NOTE — Evaluation (Signed)
Occupational Therapy Evaluation Patient Details Name: [name] [name] MRN: [id number] DOB: 11/01/1945 Today's Date: 10/07/2021   History of Present Illness Pt is a 76 y/o male presenting on 6/28 with tingling in bil hands/weakness after syncopal event. 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  PMH includes: CAD with CABG 2017, CVA, PAD, prediabetes.   Clinical Impression   PTA patient independent and driving. Admitted for above and presents with problem list below, including impaired cognition, mild unsteadiness and decreased activity tolerance.  He demonstrates difficulty with problem solving, awareness and attention, specifically noted when attempting to count backwards from 20 and making several errors-- recommended further testing using pill box test.  Completing ADLs with min guard to supervision, min guard for transfers and mobility in room.  Recommended spouse assist with driving and med mgmt at this time, pt and spouse agreeable.  Based on performance today, recommend neuro outpt OT at dc and will continue to follow acutely.      Recommendations for follow up therapy are one component of a multi-disciplinary discharge planning process, led by the attending physician.  Recommendations may be updated based on patient status, additional functional criteria and insurance authorization.   Follow Up Recommendations  Outpatient OT    Assistance Recommended at Discharge Intermittent Supervision/Assistance  Patient can return home with the following A little help with walking and/or transfers;A little help with bathing/dressing/bathroom;Assistance with cooking/housework;Direct supervision/assist for medications management;Direct supervision/assist for financial management;Assist for transportation    Functional Status Assessment  Patient has had a recent decline in their functional status and demonstrates the ability to make significant improvements in function in  a reasonable and predictable amount of time.  Equipment Recommendations  None recommended by OT    Recommendations for Other Services PT consult;Speech consult     Precautions / Restrictions Precautions Precautions: Fall Restrictions Weight Bearing Restrictions: No      Mobility Bed Mobility               General bed mobility comments: EOB upon entry    Transfers Overall transfer level: Needs assistance   Transfers: Sit to/from Stand Sit to Stand: Min guard, Supervision           General transfer comment: min guard fading to supervision      Balance Overall balance assessment: Mild deficits observed, not formally tested                                         ADL either performed or assessed with clinical judgement   ADL Overall ADL's : Needs assistance/impaired     Grooming: Min guard;Standing           Upper Body Dressing : Set up;Sitting   Lower Body Dressing: Min guard;Sit to/from stand   Toilet Transfer: Min guard;Ambulation   Toileting- Clothing Manipulation and Hygiene: Supervision/safety;Sit to/from stand       Functional mobility during ADLs: Min guard;Cueing for safety General ADL Comments: pt with mild unsteadiness but reports "limp" since prior stroke     Vision Baseline Vision/History: 1 Wears glasses Ability to See in Adequate Light: 0 Adequate Patient Visual Report: No change from baseline Vision Assessment?: No apparent visual deficits     Perception     Praxis      Pertinent Vitals/Pain Pain Assessment Pain Assessment: No/denies pain     Hand Dominance Right  Extremity/Trunk Assessment Upper Extremity Assessment Upper Extremity Assessment: Overall WFL for tasks assessed (R>L but dominant R handed)   Lower Extremity Assessment Lower Extremity Assessment: Defer to PT evaluation       Communication Communication Communication: No difficulties   Cognition Arousal/Alertness:  Awake/alert Behavior During Therapy: Flat affect Overall Cognitive Status: Impaired/Different from baseline Area of Impairment: Attention, Problem solving, Awareness                   Current Attention Level: Sustained       Awareness: Emergent Problem Solving: Difficulty sequencing, Requires verbal cues, Slow processing General Comments: pt reports dificulty with attention and sequencing, but demonstrates diffulty with awareness to level of deficits and how they will affect his independence.  Multiple errors when counting backwards and difficulty with dual cognitive tasks.     General Comments  discussed safety with pt and spouse, recommended no driving at this time and assist with med mgmt    Exercises     Shoulder Instructions      Home Living Family/patient expects to be discharged to:: Private residence Living Arrangements: Spouse/significant other Available Help at Discharge: Family;Available 24 hours/day Type of Home: House Home Access: Stairs to enter [employer] of Steps: 5 Entrance Stairs-Rails: Can reach both Home Layout: One level     Bathroom Shower/Tub: Teacher, early years/pre: Standard     Home Equipment: None          Prior Functioning/Environment Prior Level of Function : Independent/Modified Independent;Driving               ADLs Comments: independent ADLs, IADLs, yardwork        OT Problem List: Decreased strength;Decreased activity tolerance;Impaired balance (sitting and/or standing);Decreased knowledge of precautions;Decreased knowledge of use of DME or AE;Decreased safety awareness;Decreased cognition      OT Treatment/Interventions: Self-care/ADL training;Therapeutic exercise;DME and/or AE instruction;Therapeutic activities;Patient/family education;Balance training;Cognitive remediation/compensation    OT Goals(Current goals can be found in the care plan section) Acute Rehab OT Goals Patient Stated Goal:  get better OT Goal Formulation: With patient Time For Goal Achievement: 10/21/21 Potential to Achieve Goals: Good  OT Frequency: Min 2X/week    Co-evaluation              AM-PAC OT "6 Clicks" Daily Activity     Outcome Measure Help from another person eating meals?: A Little Help from another person taking care of personal grooming?: A Little Help from another person toileting, which includes using toliet, bedpan, or urinal?: A Little Help from another person bathing (including washing, rinsing, drying)?: A Little Help from another person to put on and taking off regular upper body clothing?: A Little Help from another person to put on and taking off regular lower body clothing?: A Little 6 Click Score: 18   End of Session Equipment Utilized During Treatment: Gait belt Nurse Communication: Mobility status  Activity Tolerance: Patient tolerated treatment well Patient left: with call bell/phone within reach;with family/visitor present;with bed alarm set;Other (comment) (seated EOB)  OT Visit Diagnosis: Other abnormalities of gait and mobility (R26.89);Muscle weakness (generalized) (M62.81);Other symptoms and signs involving cognitive function                Time: 0930-1000 OT Time Calculation (min): 30 min Charges:  OT General Charges $OT Visit: 1 Visit OT Evaluation $OT Eval Moderate Complexity: 1 Mod OT Treatments $Self Care/Home Management : 8-22 mins  Jolaine Artist, OT Acute Rehabilitation Services Office [phone]   [place]  S Nuel Dejaynes 10/07/2021, 11:03 AM

## 2021-10-07 NOTE — Plan of Care (Signed)
  Problem: Activity: Goal: Risk for activity intolerance will decrease Outcome: Progressing   Problem: Coping: Goal: Level of anxiety will decrease Outcome: Not Applicable   Problem: Elimination: Goal: Will not experience complications related to bowel motility Outcome: Progressing Goal: Will not experience complications related to urinary retention Outcome: Progressing   Problem: Pain Managment: Goal: General experience of comfort will improve Outcome: Progressing   Problem: Safety: Goal: Ability to remain free from injury will improve Outcome: Progressing   Problem: Nutritional: Goal: Maintenance of adequate nutrition will improve Outcome: Progressing   Problem: Skin Integrity: Goal: Risk for impaired skin integrity will decrease Outcome: Progressing   Problem: Tissue Perfusion: Goal: Adequacy of tissue perfusion will improve Outcome: Progressing   Problem: Coping: Goal: Will verbalize positive feelings about self Outcome: Progressing Goal: Will identify appropriate support needs Outcome: Progressing

## 2021-10-07 NOTE — Discharge Summary (Signed)
Physician Discharge Summary  Story Vanvranken FTD:[id number] DOB: [birth year] DOA: 10/06/2021  PCP: [name], MD  Admit date: 10/06/2021 Discharge date: 10/07/2021  Time spent: 40 minutes  Recommendations for Outpatient Follow-up:  Follow outpatient CBC/CMP  Follow outpatient limited echo with definity for follow up wall motion abnormalities Follow with cardiology outpatient for antiplatelet plan after 3 months DAPT (aspirin vs plavix long term?) Follow tolerance of crestor outpatient Follow with neurology outpatient Ensure he establishes with PCP outpatient  Follow prediabetes outpatient 30 day event monitor    Discharge Diagnoses:  Active Problems:   Acute ischemic stroke (HCC)   History of stroke   Syncope   Dyslipidemia, goal LDL below 70   CAD (coronary artery disease)   Discharge Condition: stable  Diet recommendation: heart healthy  Filed Weights   10/06/21 2333  Weight: 75.4 kg    History of present illness:  Nalu Troublefield is Dayvon Dax pleasant 76 y.o. male with medical history significant for CAD with CABG in 2017, history of CVA in the 1980s, peripheral arterial disease, and prediabetes who presents emergency department after syncopal episode yesterday.  The patient reports that he was having Zayne Marovich normal day and doing some work outside when he bent over to start his weedeater and had Lauraine Crespo brief loss of consciousness.  No appreciable injury and he returned to his usual state fairly quickly but had not told his wife what happened until earlier today and she urged him to seek evaluation in the ED.  He denies any focal numbness or weakness at this time.  He relates that he had an episode in [place] approximately 3 weeks ago where he had been working outside in the heat and then felt disoriented and had some dizziness.  The symptoms had since resolved.   He is no longer taking Delon Revelo statin and has been taking ASA 81 mg and Plavix 75 mg on alternating days.    ED Course: Upon arrival to the  ED, patient is found to be afebrile and saturating well on room air with heart rate in the 50s to 60s and stable blood pressure.  EKG features sinus bradycardia with rate 56.  Head CT concerning for acute infarction in the left frontal lobe without hemorrhage.  MRI brain reveals 4.7 cm acute to early subacute left MCA distribution infarction without hemorrhage or mass effect.  Neurology was consulted by the ED physician and recommended medical admission for stroke work-up  He was admitted for stroke workup.  Neurology suspects left frontal acute infarct due to severe L ICA siphon stenosis (cardioembolic source can't be ruled out).  Recommending DAPT x3 months, then aspirin or plavix per cardiology outpatient.  Will request 30 day event monitor by cardiology.  Outpatient neurology follow up.  See below for additional details   Hospital Course:  Assessment and Plan: Acute ischemic stroke [facility]) Per neuro, likely due to severe L ICA siphon stenosis (cardioembolic source cannot be completely ruled out) MRI brain notable for 4.7 cm acute to early subacute L MCA distribution infarct, no associated hemorrhage or significant mass effect.  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 CTA head/neck negative for LVO, advanced atheromatous change about carotid siphons with associated moderate to severe multifocal narrowing.  Advanced atheromatous change about the proximal V1 segments bilaterally with associated moderate to severe stenoses. Echo with EF 50-55%, +RWMA (see below) LDL 134, a1c 6.4 Start crestor  Appreciate neurology recs - 30 day event monitoring to r/o arrhythmia (message sent to card master), DAPT  x 3 months given L  ICA siphon high grade stenosis then either aspirin or plavix per cardiology, crestor   Syncope Pending orthostatics (most likely given working outside, bending over?) Echo with EF 50-55%, RWMA, grade 1 diastolic dysfunction -> cards recommended repeat limited study with definity  contrast (pending) Repeat limited echo won't be completed before 6/30, I don't think this should delay his discharge.  Would recommend outpatient follow up for this (discussed with cards).  Follow outpatient. Planning for 30 day event monitor outpatient   History of stroke Residual L sided weakness  CAD (coronary artery disease) Hx CABG in 2017     Procedures: Echo IMPRESSIONS     1. Left ventricular ejection fraction, by estimation, is 50 to 55%. The  left ventricle has low normal function. The left ventricle demonstrates  regional wall motion abnormalities (see scoring diagram/findings for  description). Left ventricular diastolic   parameters are consistent with Grade I diastolic dysfunction (impaired  relaxation). There is hypokinesis of the left ventricular, apical inferior  wall.   2. Right ventricular systolic function is normal. The right ventricular  size is normal. There is normal pulmonary artery systolic pressure. The  estimated right ventricular systolic pressure is 26.7 mmHg.   3. The mitral valve is normal in structure. Trivial mitral valve  regurgitation. No evidence of mitral stenosis.   4. The aortic valve is tricuspid. Aortic valve regurgitation is not  visualized. Aortic valve sclerosis/calcification is present, without any  evidence of aortic stenosis. Aortic valve area, by VTI measures 1.98 cm.  Aortic valve mean gradient measures  4.0 mmHg. Aortic valve Vmax measures 1.21 m/s.   5. The inferior vena cava is normal in size with greater than 50%  respiratory variability, suggesting right atrial pressure of 3 mmHg.   6. Recommend repeat limited study with definity contrast to define wall  motion    Consultations: neurology  Discharge Exam: Vitals:   10/07/21 1203 10/07/21 1558  BP: 110/65 111/66  Pulse: (!) 59 67  Resp: 16 18  Temp: 97.8 F (36.6 C) 98.6 F (37 C)  SpO2: 94% 98%   No new complaints Feels back to himself Chronic L sided  weakness from old stroke  General: No acute distress. Cardiovascular: RRR Lungs: unlabored Abdomen: Soft, nontender, nondistended Neurological: L sided weakness, chronic Extremities: No clubbing or cyanosis. No edema.   Discharge Instructions   Discharge Instructions     Ambulatory referral to Neurology   Complete by: As directed    Follow up with Dr. Tomi Likens at [facility] in 4-6 weeks. Pt is Dr. Georgie Chard pt. Thanks.   Ambulatory referral to Neurology   Complete by: As directed    An appointment is requested in approximately: 4 weeks   Ambulatory referral to Occupational Therapy   Complete by: As directed    Ambulatory referral to Physical Therapy   Complete by: As directed    S/p stroke   T.E.N.S. Unit Evaluation and Dispense as Indicated: No   Ambulatory referral to Physical Therapy   Complete by: As directed    Ambulatory referral to Speech Therapy   Complete by: As directed    Call MD for:  difficulty breathing, headache or visual disturbances   Complete by: As directed    Call MD for:  extreme fatigue   Complete by: As directed    Call MD for:  hives   Complete by: As directed    Call MD for:  persistant dizziness or light-headedness   Complete  by: As directed    Call MD for:  persistant nausea and vomiting   Complete by: As directed    Call MD for:  redness, tenderness, or signs of infection (pain, swelling, redness, odor or green/yellow discharge around incision site)   Complete by: As directed    Call MD for:  severe uncontrolled pain   Complete by: As directed    Call MD for:  temperature >100.4   Complete by: As directed    Diet - low sodium heart healthy   Complete by: As directed    Discharge instructions   Complete by: As directed    You were seen for Kamylle Axelson stroke.  We've recommended you continue your aspirin and plavix daily for 3 months.  After this period of time, you can discuss with cardiology how they'd like you to take these medicine (whether to take aspirin  or plavix) (discuss this with cardiology before you run out of medicine so you have Ottilie Wigglesworth plan when the 3 months is up).  You need Shayanna Thatch repeat echocardiogram as an outpatient to evaluate the wall motion abnormalities seen here.  Follow this up with Dr. Stanford Breed.  We'll arrange Carron Jaggi 30 day event monitor for you with cardiology.  We've started you on crestor.  Follow up your cholesterol with your PCP.  You have prediabetes.  Diet, exercise, and maintaining Nikesh Teschner healthy weight are important to prevent the progression to diabetes.  Please follow up with Dr. Tomi Likens from [facility] neurolog[facility] outpatient.  It's not clear what caused your loss of consciousness.  Follow this up with your PCP and cardiologist.   Return for new, recurrent, or worsening symptoms.  Please ask your PCP to request records from this hospitalization so they know what was done and what the next steps will be.   Discharge patient   Complete by: As directed    Discharge disposition: 01-Home or Self Care   Discharge patient date: 10/07/2021   Increase activity slowly   Complete by: As directed       Allergies as of 10/07/2021   No Known Allergies      Medication List     TAKE these medications    aspirin EC 81 MG tablet Take 1 tablet (81 mg total) by mouth daily. Take daily with plavix for 3 months, then ask cardiology how they recommend you take the aspirin and plavix What changed:  when to take this additional instructions   clopidogrel 75 MG tablet Commonly known as: PLAVIX Take 1 tablet (75 mg total) by mouth daily. Take daily with aspirin for 3 months.  Then discuss with cardiology to see what they'd like you to do with your aspirin and plavix. What changed: additional instructions   rosuvastatin 20 MG tablet Commonly known as: CRESTOR Take 1 tablet (20 mg total) by mouth daily. Start taking on: October 08, 2021       No Known Allergies  Follow-up Information     Pieter Partridge, DO. Schedule an appointment as soon  as possible for Deandra Goering visit in 1 month(s).   Specialty: Neurology Contact information: [place] [id number] [phone]         [place]. Schedule an appointment as soon as possible for Haylyn Halberg visit.   Specialty: Rehabilitation Why: Call to schedule appt Contact information: [place] [phone] [place]  The results of significant diagnostics from this hospitalization (including imaging, microbiology, ancillary and laboratory) are listed below for reference.    Significant Diagnostic Studies: ECHOCARDIOGRAM COMPLETE  Result Date: 10/07/2021    ECHOCARDIOGRAM REPORT   Patient Name:   FAWAZ BORQUEZ Date of Exam: 10/07/2021 Medical Rec #:  [id number]    Height:       65.0 in Accession #:    [id number]   Weight:       166.2 lb Date of Birth:  1945-09-21    BSA:          1.829 m Patient Age:    76 years     BP:           114/65 mmHg Patient Gender: M            HR:           57 bpm. Exam Location:  Inpatient Procedure: 2D Echo, Color Doppler and Cardiac Doppler Indications:    Stroke  History:        Patient has prior history of Echocardiogram examinations, most                 recent 10/03/2016. CAD, Prior CABG; PAD.  Sonographer:    Joette Catching RCS Referring Phys: [phone] [place]  1. Left ventricular ejection fraction, by estimation, is 50 to 55%. The left ventricle has low normal function. The left ventricle demonstrates regional wall motion abnormalities (see scoring diagram/findings for description). Left ventricular diastolic  parameters are consistent with Grade I diastolic dysfunction (impaired relaxation). There is hypokinesis of the left ventricular, apical inferior wall.  2. Right ventricular systolic function is normal. The right ventricular size is normal. There is normal pulmonary artery systolic pressure. The  estimated right ventricular systolic pressure is 49.7 mmHg.  3. The mitral valve is normal in structure. Trivial mitral valve regurgitation. No evidence of mitral stenosis.  4. The aortic valve is tricuspid. Aortic valve regurgitation is not visualized. Aortic valve sclerosis/calcification is present, without any evidence of aortic stenosis. Aortic valve area, by VTI measures 1.98 cm. Aortic valve mean gradient measures 4.0 mmHg. Aortic valve Vmax measures 1.21 m/s.  5. The inferior vena cava is normal in size with greater than 50% respiratory variability, suggesting right atrial pressure of 3 mmHg.  6. Recommend repeat limited study with definity contrast to define wall motion FINDINGS  Left Ventricle: Left ventricular ejection fraction, by estimation, is 50 to 55%. The left ventricle has low normal function. The left ventricle demonstrates regional wall motion abnormalities. The left ventricular internal cavity size was normal in size. There is no left ventricular hypertrophy. Left ventricular diastolic parameters are consistent with Grade I diastolic dysfunction (impaired relaxation). Normal left ventricular filling pressure. Right Ventricle: The right ventricular size is normal. No increase in right ventricular wall thickness. Right ventricular systolic function is normal. There is normal pulmonary artery systolic pressure. The tricuspid regurgitant velocity is 2.27 m/s, and  with an assumed right atrial pressure of 3 mmHg, the estimated right ventricular systolic pressure is 02.6 mmHg. Left Atrium: Left atrial size was normal in size. Right Atrium: Right atrial size was normal in size. Pericardium: There is no evidence of pericardial effusion. Mitral Valve: The mitral valve is normal in structure. Trivial mitral valve regurgitation. No evidence of mitral valve stenosis. Tricuspid Valve: The tricuspid valve is normal in structure. Tricuspid valve regurgitation is mild . No evidence of tricuspid stenosis. Aortic  Valve: The aortic valve is  tricuspid. Aortic valve regurgitation is not visualized. Aortic valve sclerosis/calcification is present, without any evidence of aortic stenosis. Aortic valve mean gradient measures 4.0 mmHg. Aortic valve peak gradient measures 5.9 mmHg. Aortic valve area, by VTI measures 1.98 cm. Pulmonic Valve: The pulmonic valve was normal in structure. Pulmonic valve regurgitation is trivial. No evidence of pulmonic stenosis. Aorta: The aortic root is normal in size and structure. Venous: The inferior vena cava is normal in size with greater than 50% respiratory variability, suggesting right atrial pressure of 3 mmHg. IAS/Shunts: No atrial level shunt detected by color flow Doppler.  LEFT VENTRICLE PLAX 2D LVIDd:         4.60 cm     Diastology LVIDs:         3.80 cm     LV e' medial:    5.34 cm/s LV PW:         0.90 cm     LV E/e' medial:  13.2 LV IVS:        1.10 cm     LV e' lateral:   5.43 cm/s LVOT diam:     2.00 cm     LV E/e' lateral: 13.0 LV SV:         56 LV SV Index:   31 LVOT Area:     3.14 cm  LV Volumes (MOD) LV vol d, MOD A2C: 93.7 ml LV vol d, MOD A4C: 81.3 ml LV vol s, MOD A2C: 56.0 ml LV vol s, MOD A4C: 41.6 ml LV SV MOD A2C:     37.7 ml LV SV MOD A4C:     81.3 ml LV SV MOD BP:      36.0 ml RIGHT VENTRICLE            IVC RV S prime:     7.62 cm/s  IVC diam: 1.20 cm TAPSE (M-mode): 1.4 cm LEFT ATRIUM             Index        RIGHT ATRIUM           Index LA diam:        3.30 cm 1.80 cm/m   RA Area:     13.00 cm LA Vol (A2C):   33.8 ml 18.48 ml/m  RA Volume:   35.30 ml  19.30 ml/m LA Vol (A4C):   38.4 ml 21.00 ml/m LA Biplane Vol: 39.2 ml 21.44 ml/m  AORTIC VALVE                    PULMONIC VALVE AV Area (Vmax):    2.51 cm     PV Vmax:          0.94 m/s AV Area (Vmean):   2.10 cm     PV Peak grad:     3.5 mmHg AV Area (VTI):     1.98 cm     PR End Diast Vel: 2.84 msec AV Vmax:           121.00 cm/s AV Vmean:          92.600 cm/s AV VTI:            0.282 m AV Peak Grad:      5.9  mmHg AV Mean Grad:      4.0 mmHg LVOT Vmax:         96.60 cm/s LVOT Vmean:        62.000 cm/s LVOT VTI:  0.178 m LVOT/AV VTI ratio: 0.63  AORTA Ao Root diam: 3.40 cm Ao Asc diam:  3.40 cm MITRAL VALVE               TRICUSPID VALVE MV Area (PHT): 4.52 cm    TR Peak grad:   20.6 mmHg MV Decel Time: 168 msec    TR Vmax:        227.00 cm/s MV E velocity: 70.70 cm/s MV Elinore Shults velocity: 92.50 cm/s  SHUNTS MV E/Jailyne Chieffo ratio:  0.76        Systemic VTI:  0.18 m                            Systemic Diam: 2.00 cm Fransico Him MD Electronically signed by Fransico Him MD Signature Date/Time: 10/07/2021/9:12:01 AM    Final    CT ANGIO HEAD NECK W WO CM  Result Date: 10/07/2021 CLINICAL DATA:  Follow-up examination for acute stroke. EXAM: CT ANGIOGRAPHY HEAD AND NECK TECHNIQUE: Multidetector CT imaging of the head and neck was performed using the standard protocol during bolus administration of intravenous contrast. Multiplanar CT image reconstructions and MIPs were obtained to evaluate the vascular anatomy. Carotid stenosis measurements (when applicable) are obtained utilizing NASCET criteria, using the distal internal carotid diameter as the denominator. RADIATION DOSE REDUCTION: This exam was performed according to the departmental dose-optimization program which includes automated exposure control, adjustment of the mA and/or kV according to patient size and/or use of iterative reconstruction technique. CONTRAST:  75m OMNIPAQUE IOHEXOL 350 MG/ML SOLN COMPARISON:  Prior CT and MRI from 10/06/2021. FINDINGS: CTA NECK FINDINGS Aortic arch: Visualized aortic arch normal in caliber with standard branching pattern. Mild atheromatous change about the origin of the left subclavian artery. No significant stenosis about the origin of the great vessels. Right carotid system: Right CCA widely patent proximally. Eccentric soft plaque within the mid cervical ICA without significant stenosis. Mild mixed plaque about the right carotid bulb  without stenosis. Right ICA patent distally without stenosis or dissection. Left carotid system: Left CCA widely patent proximally. Soft concentric plaque about the mid cervical ICA without hemodynamically significant stenosis. Mild calcified plaque about the left carotid bulb/proximal left ICA without stenosis. Left ICA patent distally without stenosis or dissection. Vertebral arteries: Both vertebral arteries arise from subclavian arteries. No significant proximal subclavian artery stenosis. Atheromatous change seen about the proximal V1 segments bilaterally with associated moderate to severe stenoses (series 6, images 80, 92 on the left, image 94 on the right). Vertebral arteries otherwise patent distally without stenosis or dissection. Right vertebral artery dominant. Skeleton: No discrete or worrisome osseous lesions. Moderate spondylosis present at C5-6 and C6-7 without significant stenosis. Median sternotomy wires noted. Other neck: No other acute soft tissue abnormality within the neck. Upper chest: Scattered atelectatic changes with mild pulmonary interstitial congestion noted within the visualized lungs. Visualized upper chest demonstrates no other acute finding. Review of the MIP images confirms the above findings CTA HEAD FINDINGS Anterior circulation: Petrous segments patent bilaterally without significant stenosis. Advanced atheromatous change within the carotid siphons with associated moderate to severe multifocal narrowing. A1 segments patent bilaterally. Normal anterior communicating artery complex. Both ACAs patent to their distal aspects without stenosis. No M1 stenosis or occlusion. No proximal MCA branch occlusion. Distal MCA branches well perfused and fairly symmetric. Posterior circulation: Atheromatous change about the proximal V4 segments bilaterally with moderate stenoses. Right PICA patent. Left PICA not well seen. Basilar irregular but  patent without significant stenosis. Superior  cerebral arteries patent bilaterally. Left PCA supplied via the basilar. Fetal type origin of the right PCA. Both PCAs patent to their distal aspects without significant stenosis. Venous sinuses: Grossly patent allowing for timing the contrast bolus. Anatomic variants: Fetal type right PCA.  No aneurysm. Review of the MIP images confirms the above findings IMPRESSION: 1. Negative CTA for large vessel occlusion or other emergent finding. 2. Advanced atheromatous change about the carotid siphons with associated moderate to severe multifocal narrowing. 3. Advanced atheromatous change about the proximal V1 segments bilaterally with associated moderate to severe stenoses. Right vertebral artery dominant. Electronically Signed   By: Jeannine Boga M.D.   On: 10/07/2021 04:52   MR BRAIN W WO CONTRAST  Result Date: 10/06/2021 CLINICAL DATA:  Initial evaluation for acute stroke. EXAM: MRI HEAD WITHOUT AND WITH CONTRAST TECHNIQUE: Multiplanar, multiecho pulse sequences of the brain and surrounding structures were obtained without and with intravenous contrast. CONTRAST:  7.53m GADAVIST GADOBUTROL 1 MMOL/ML IV SOLN COMPARISON:  Prior CT from earlier the same day as well as previous MRI from 08/15/2019. FINDINGS: Brain: Age-related cerebral atrophy. Patchy T2/FLAIR hyperintensity involving the periventricular deep white matter both cerebral hemispheres, most consistent with chronic small vessel ischemic disease, mild in nature. Encephalomalacia and gliosis at the left parietal lobe consistent with Daysie Helf chronic ischemic infarct. Remote lacunar infarct present at the right pons. Confluent restricted diffusion involving the cortical and subcortical left frontal lobe measuring up to 4.7 cm, consistent with acute to early subacute left MCA distribution infarct. No significant associated blood products. No mass effect. Associated mild patchy post-contrast enhancement noted. No other evidence for acute or subacute ischemia.  Gray-white matter differentiation otherwise maintained. Minimal chronic hemosiderin staining noted about the chronic left parietal infarct. No other acute or chronic intracranial blood products. No mass lesion, midline shift or mass effect no hydrocephalus or extra-axial fluid collection. Pituitary gland suprasellar region normal. No other abnormal enhancement. Vascular: Major intracranial vascular flow voids are maintained. Skull and upper cervical spine: Craniocervical junction within normal limits. Bone marrow signal intensity within normal limits. No scalp soft tissue abnormality. Sinuses/Orbits: Prior bilateral ocular lens replacement. Scattered mucosal thickening noted throughout the paranasal sinuses. No significant mastoid effusion. Other: None. IMPRESSION: 1. 4.7 cm acute to early subacute left MCA distribution infarct. No associated hemorrhage or significant mass effect. 2. No other acute intracranial abnormality. 3. 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 4. Underlying age-related cerebral atrophy with mild chronic small vessel ischemic disease. Electronically Signed   By: BJeannine BogaM.D.   On: 10/06/2021 23:07   CT Head Wo Contrast  Result Date: 10/06/2021 CLINICAL DATA:  Syncope/presyncope, cerebrovascular cause suspected EXAM: CT HEAD WITHOUT CONTRAST TECHNIQUE: Contiguous axial images were obtained from the base of the skull through the vertex without intravenous contrast. RADIATION DOSE REDUCTION: This exam was performed according to the departmental dose-optimization program which includes automated exposure control, adjustment of the mA and/or kV according to patient size and/or use of iterative reconstruction technique. COMPARISON:  MRI 08/15/2019 FINDINGS: Brain: There is new loss of gray-white matter differentiation in the left frontal lobe consistent with acute infarct (series 3, image 22). There is encephalomalacia in the high left parietal cortex consistent with  chronic infarct.No abnormal extra-axial collection. No concerning mass effect.No midline shift. The ventricular system is normal in size.Additional nonspecific areas of subcortical and periventricular hypoattenuation likely related to chronic small vessel ischemic disease. Vascular: No hyperdense vessel. Skull: Negative for skull  fracture given limitations of motion artifact. Sinuses/Orbits: Mild posterior left ethmoid air cell mucosal thickening. Other: None. IMPRESSION: Findings concerning for acute infarct in the left frontal lobe. No acute intracranial hemorrhage. Encephalomalacia in the high left parietal cortex consistent with old infarct. Critical Value/emergent results were called by telephone at the time of interpretation on 10/06/2021 at 3:28 pm to provider [facility] , who verbally acknowledged these results. Electronically Signed   By: Maurine Simmering M.D.   On: 10/06/2021 15:29    Microbiology: No results found for this or any previous visit (from the past 240 hour(s)).   Labs: Basic Metabolic Panel: Recent Labs  Lab 10/06/21 1405 10/07/21 0412  NA 141 138  K 4.0 3.7  CL 106 109  CO2 23 24  GLUCOSE 179* 130*  BUN 11 9  CREATININE 0.94 0.90  CALCIUM 9.3 8.5*   Liver Function Tests: Recent Labs  Lab 10/07/21 0412  AST 19  ALT 16  ALKPHOS 52  BILITOT 0.8  PROT 6.3*  ALBUMIN 3.2*   No results for input(s): "LIPASE", "AMYLASE" in the last 168 hours. No results for input(s): "AMMONIA" in the last 168 hours. CBC: Recent Labs  Lab 10/06/21 1405 10/07/21 0412  WBC 8.4 8.7  HGB 15.4 14.1  HCT 45.2 41.1  MCV 99.3 97.9  PLT 320 290   Cardiac Enzymes: No results for input(s): "CKTOTAL", "CKMB", "CKMBINDEX", "TROPONINI" in the last 168 hours. BNP: BNP (last 3 results) No results for input(s): "BNP" in the last 8760 hours.  ProBNP (last 3 results) No results for input(s): "PROBNP" in the last 8760 hours.  CBG: Recent Labs  Lab 10/06/21 1401 10/07/21 0631  10/07/21 1204 10/07/21 1559  GLUCAP 183* 128* 109* 126*       Signed:  Fayrene Helper MD.  [facility] 10/07/2021, 5:24 PM

## 2021-10-07 NOTE — Assessment & Plan Note (Addendum)
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 CTA head/neck negative for LVO, advanced atheromatous change about carotid siphons with associated moderate to severe multifocal narrowing.  Advanced atheromatous change about the proximal V1 segments bilaterally with associated moderate to severe stenoses. Echo with EF 50-55%, +RWMA (see below) LDL 134, a1c 6.4 Start crestor  Appreciate neurology recs - 30 day event monitoring to r/o arrhythmia (message sent to card master), DAPT x 3 months given L  ICA siphon high grade stenosis then either aspirin or plavix per cardiology, crestor

## 2021-10-07 NOTE — Progress Notes (Signed)
PT Cancellation Note  Patient Details Name: Dalin Caldera MRN: [id number] DOB: 18-Oct-1945   Cancelled Treatment:    Reason Eval/Treat Not Completed: Patient at procedure or test/unavailable. Pt currently with SLP. Will check back as schedule allows to initiate PT evaluation.    Thelma Comp 10/07/2021, 2:29 PM  Rolinda Roan, PT, DPT Acute Rehabilitation Services Secure Chat Preferred Office: [phone]

## 2021-10-12 ENCOUNTER — Ambulatory Visit (INDEPENDENT_AMBULATORY_CARE_PROVIDER_SITE_OTHER): Payer: Medicare PPO

## 2021-10-12 DIAGNOSIS — I4891 Unspecified atrial fibrillation: Secondary | ICD-10-CM

## 2021-10-12 DIAGNOSIS — I639 Cerebral infarction, unspecified: Secondary | ICD-10-CM

## 2021-10-19 NOTE — Progress Notes (Unsigned)
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  History supplemented by hospital records.    Patient had a stroke in 1980 presenting with left sided hemiparesis.  No specific cause was identified.  Still had a some residual left foot drop.  MRI/MRA of head in 2021 and CT/MRI brain and CTA head and neck last month personally reviewed.  I previously saw patient in April 2021 for right arm weakness.  MRI of brain revealed subacute to chronic posterior left MCA territory infarct involving the left parietal cortex.  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  He was advised to continue Plavix.  Echocardiogram was ordered but patient never had performed and was lost to follow up.  He was admitted to [facility] on 10/06/2021 for syncope.  He had a syncopal episode the previous day as well as a near syncopal earlier in the month.  CT head showed possible evolving acute left frontal infarct.  MRI of brain confirmed acute left frontal infarct as well as chronic left frontoparietal infarct.  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  2D echocardiogram showed EF 50-55% with hypokinesis of left venticular, apical inferior wall and no interatrial shunt.  LDL was 134 and Hgb A1c 6.4.  Prior to admission, he was alternating everyday between ASA '81mg'$  and Plavix.  He was discharged on DAPT for 3 months due to high-grade stenosis of left ICA siphon, followed by  monotherapy o either ASA or Plavix.  He was also started on Crestor '20mg'$ .  30 day cardiac event monitor was recommended.  PAST MEDICAL HISTORY: Past Medical History:  Diagnosis Date   Constipation    Diabetes mellitus without complication ([place])    borderline/ pre diabetic   NSTEMI (non-ST elevated myocardial infarction) ([place])    at [place]   Stroke ([place])    L side - 40 years ago    MEDICATIONS: Current Outpatient Medications on File Prior to Visit  Medication Sig Dispense Refill   aspirin EC 81 MG tablet Take 1 tablet (81 mg total) by mouth daily. Take daily with plavix for 3 months, then ask cardiology how they recommend you take the aspirin and plavix 30 tablet 3   clopidogrel (PLAVIX) 75 MG tablet Take 1 tablet (75 mg total) by mouth daily. Take daily with aspirin for 3 months.  Then discuss with cardiology to see what they'd like you to do with your aspirin and plavix. 30 tablet 3   rosuvastatin (CRESTOR) 20 MG tablet Take 1 tablet (20 mg total) by mouth daily. 30 tablet 11   No current facility-administered medications on file prior to visit.    ALLERGIES: No Known Allergies  FAMILY HISTORY: Family History  Problem Relation Age of Onset   Liver disease Father       Objective:  *** General: No acute distress.  Patient appears well-groomed.   Head:  Normocephalic/atraumatic Eyes:  Fundi examined but not visualized Neck: supple, no paraspinal tenderness, full range of motion Heart:  Regular rate and rhythm Lungs:  Clear to auscultation bilaterally Back: No paraspinal tenderness Neurological Exam: alert and oriented to person, place, and time.  Speech fluent and not dysarthric, language intact.  CN II-XII intact. Bulk and tone normal, muscle strength 5/5 throughout.  Sensation to light touch intact.  Deep tendon reflexes 2+ throughout, toes downgoing.  Finger to nose testing intact.  Gait normal, Romberg negative.   Metta Clines, DO  CC:  Delia Chimes,  MD

## 2021-10-20 ENCOUNTER — Ambulatory Visit (INDEPENDENT_AMBULATORY_CARE_PROVIDER_SITE_OTHER): Payer: Medicare PPO | Admitting: Neurology

## 2021-10-20 ENCOUNTER — Encounter: Payer: Self-pay | Admitting: Neurology

## 2021-10-20 VITALS — BP 131/75 | HR 60 | Ht 65.0 in | Wt 169.0 lb

## 2021-10-20 DIAGNOSIS — E785 Hyperlipidemia, unspecified: Secondary | ICD-10-CM | POA: Diagnosis not present

## 2021-10-20 DIAGNOSIS — R55 Syncope and collapse: Secondary | ICD-10-CM | POA: Diagnosis not present

## 2021-10-20 DIAGNOSIS — I63232 Cerebral infarction due to unspecified occlusion or stenosis of left carotid arteries: Secondary | ICD-10-CM | POA: Diagnosis not present

## 2021-10-20 DIAGNOSIS — R7303 Prediabetes: Secondary | ICD-10-CM

## 2021-10-20 DIAGNOSIS — I2510 Atherosclerotic heart disease of native coronary artery without angina pectoris: Secondary | ICD-10-CM

## 2021-10-20 NOTE — Patient Instructions (Signed)
CONTINUE ASPIRIN AND PLAVIX FOR AT LEAST 3 MONTHS - DEFER TO CARDIOLOGY WHETHER TO STAY ON BOTH BLOOD THINNERS, JUST ASPIRIN OR JUST PLAVIX CONTINUE ROSUVASTATIN MEDITERRANEAN DIET (SEE BELOW) FOLLOW UP 6 MONTHS.    Mediterranean Diet A Mediterranean diet refers to food and lifestyle choices that are based on the traditions of countries located on the [employer]. It focuses on eating more fruits, vegetables, whole grains, beans, nuts, seeds, and heart-healthy fats, and eating less dairy, meat, eggs, and processed foods with added sugar, salt, and fat. This way of eating has been shown to help prevent certain conditions and improve outcomes for people who have chronic diseases, like kidney disease and heart disease. What are tips for following this plan? Reading food labels Check the serving size of packaged foods. For foods such as rice and pasta, the serving size refers to the amount of cooked product, not dry. Check the total fat in packaged foods. Avoid foods that have saturated fat or trans fats. Check the ingredient list for added sugars, such as corn syrup. Shopping  Buy a variety of foods that offer a balanced diet, including: Fresh fruits and vegetables (produce). Grains, beans, nuts, and seeds. Some of these may be available in unpackaged forms or large amounts (in bulk). Fresh seafood. Poultry and eggs. Low-fat dairy products. Buy whole ingredients instead of prepackaged foods. Buy fresh fruits and vegetables in-season from local farmers markets. Buy plain frozen fruits and vegetables. If you do not have access to quality fresh seafood, buy precooked frozen shrimp or canned fish, such as tuna, salmon, or sardines. Stock your pantry so you always have certain foods on hand, such as olive oil, canned tuna, canned tomatoes, rice, pasta, and beans. Cooking Cook foods with extra-virgin olive oil instead of using butter or other vegetable oils. Have meat as a side dish, and  have vegetables or grains as your main dish. This means having meat in small portions or adding small amounts of meat to foods like pasta or stew. Use beans or vegetables instead of meat in common dishes like chili or lasagna. Experiment with different cooking methods. Try roasting, broiling, steaming, and sauting vegetables. Add frozen vegetables to soups, stews, pasta, or rice. Add nuts or seeds for added healthy fats and plant protein at each meal. You can add these to yogurt, salads, or vegetable dishes. Marinate fish or vegetables using olive oil, lemon juice, garlic, and fresh herbs. Meal planning Plan to eat one vegetarian meal one day each week. Try to work up to two vegetarian meals, if possible. Eat seafood two or more times a week. Have healthy snacks readily available, such as: Vegetable sticks with hummus. Greek yogurt. Fruit and nut trail mix. Eat balanced meals throughout the week. This includes: Fruit: 2-3 servings a day. Vegetables: 4-5 servings a day. Low-fat dairy: 2 servings a day. Fish, poultry, or lean meat: 1 serving a day. Beans and legumes: 2 or more servings a week. Nuts and seeds: 1-2 servings a day. Whole grains: 6-8 servings a day. Extra-virgin olive oil: 3-4 servings a day. Limit red meat and sweets to only a few servings a month. Lifestyle  Cook and eat meals together with your family, when possible. Drink enough fluid to keep your urine pale yellow. Be physically active every day. This includes: Aerobic exercise like running or swimming. Leisure activities like gardening, walking, or housework. Get 7-8 hours of sleep each night. If recommended by your health care provider, drink red wine in moderation.  This means 1 glass a day for nonpregnant women and 2 glasses a day for men. A glass of wine equals 5 oz (150 mL). What foods should I eat? Fruits Apples. Apricots. Avocado. Berries. Bananas. Cherries. Dates. Figs. Grapes. Lemons. Melon. Oranges.  Peaches. Plums. Pomegranate. Vegetables Artichokes. Beets. Broccoli. Cabbage. Carrots. Eggplant. Green beans. Chard. Kale. Spinach. Onions. Leeks. Peas. Squash. Tomatoes. Peppers. Radishes. Grains Whole-grain pasta. Brown rice. Bulgur wheat. Polenta. Couscous. Whole-wheat bread. [place]. Meats and other proteins Beans. Almonds. Sunflower seeds. Pine nuts. Peanuts. [place]. Salmon. Scallops. Shrimp. [place]. Tilapia. Clams. Oysters. Eggs. Poultry without skin. Dairy Low-fat milk. Cheese. Greek yogurt. Fats and oils Extra-virgin olive oil. Avocado oil. Grapeseed oil. Beverages Water. Red wine. Herbal tea. Sweets and desserts Greek yogurt with honey. Baked apples. Poached pears. Trail mix. Seasonings and condiments Basil. Cilantro. Coriander. Cumin. Mint. Parsley. Sage. Rosemary. Tarragon. Garlic. Oregano. Thyme. Pepper. Balsamic vinegar. Tahini. Hummus. Tomato sauce. Olives. Mushrooms. The items listed above may not be a complete list of foods and beverages you can eat. Contact a dietitian for more information. What foods should I limit? This is a list of foods that should be eaten rarely or only on special occasions. Fruits Fruit canned in syrup. Vegetables Deep-fried potatoes (french fries). Grains Prepackaged pasta or rice dishes. Prepackaged cereal with added sugar. Prepackaged snacks with added sugar. Meats and other proteins Beef. Pork. Lamb. Poultry with skin. Hot dogs. [place] Salines. Dairy Ice cream. Sour cream. Whole milk. Fats and oils Butter. Canola oil. Vegetable oil. Beef fat (tallow). Lard. Beverages Juice. Sugar-sweetened soft drinks. Beer. Liquor and spirits. Sweets and desserts Cookies. Cakes. Pies. Candy. Seasonings and condiments Mayonnaise. Pre-made sauces and marinades. The items listed above may not be a complete list of foods and beverages you should limit. Contact a dietitian for more information. Summary The Mediterranean diet includes both food and lifestyle  choices. Eat a variety of fresh fruits and vegetables, beans, nuts, seeds, and whole grains. Limit the amount of red meat and sweets that you eat. If recommended by your health care provider, drink red wine in moderation. This means 1 glass a day for nonpregnant women and 2 glasses a day for men. A glass of wine equals 5 oz (150 mL). This information is not intended to replace advice given to you by your health care provider. Make sure you discuss any questions you have with your health care provider. Document Revised: 05/03/2019 Document Reviewed: 02/28/2019 Elsevier Patient Education  [place].

## 2021-10-21 ENCOUNTER — Ambulatory Visit: Payer: Medicare PPO

## 2021-10-21 ENCOUNTER — Other Ambulatory Visit: Payer: Self-pay

## 2021-10-21 DIAGNOSIS — M792 Neuralgia and neuritis, unspecified: Secondary | ICD-10-CM

## 2021-10-21 DIAGNOSIS — I6350 Cerebral infarction due to unspecified occlusion or stenosis of unspecified cerebral artery: Secondary | ICD-10-CM

## 2021-10-21 NOTE — Assessment & Plan Note (Signed)
July 2023 found to have L siphon ICA stenosis causing mca distribution infarct. 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 Patient denies any orthostasis, changes in vision, difficulty with talking or word finding, difficulty with chewing or swallowing, sensory or motor deficits. On exam CN 2-12 intact, no deficits to touch or vibration, 5/5 strength bilaterally about all joints of the upper and lower extremity, no abnormalities on romberg and pronator drift testing. Following with neurology with 6 month follow up. Also following with cardiology. Lipid panel found to have elevated LDL to 134 while hospitalized and started on a statin. Patient had an A1c at that time of 6.4% consistent with pre-diabetes, but being managed with mediterranean diet and lifestyle changes. BP was 114/ 67 and continues to be well controlled. Patient does not smoke or drink. -DAPT for 3 month with plavix '75mg'$  daily and ASA 81 daily -Rosuvastatin 20 mg daily, repeat lipid panel @ 3 month follow up -cardiac event monitor 30 days

## 2021-10-21 NOTE — Progress Notes (Signed)
New Patient Office Visit  Subjective    Patient ID: [name] [name], male    DOB: 1946-02-16  Age: 76 y.o. MRN: [id number]  CC:  Chief Complaint  Patient presents with   Hospitalization Follow-up   Establish Care    HPI [name] [name] is a 76 y/o with a pmh outlined below who presents to establish care.  Outpatient Encounter Medications as of 10/21/2021  Medication Sig   aspirin EC 81 MG tablet Take 1 tablet (81 mg total) by mouth daily. Take daily with plavix for 3 months, then ask cardiology how they recommend you take the aspirin and plavix   clopidogrel (PLAVIX) 75 MG tablet Take 1 tablet (75 mg total) by mouth daily. Take daily with aspirin for 3 months.  Then discuss with cardiology to see what they'd like you to do with your aspirin and plavix.   rosuvastatin (CRESTOR) 20 MG tablet Take 1 tablet (20 mg total) by mouth daily.   No facility-administered encounter medications on file as of 10/21/2021.    Past Medical History:  Diagnosis Date   Constipation    Diabetes mellitus without complication ([facility])    borderline/ pre diabetic   NSTEMI (non-ST elevated myocardial infarction) ([facility])    at [place]   Stroke ([place])    L side - 40 years ago    Past Surgical History:  Procedure Laterality Date   CARDIAC CATHETERIZATION     CORONARY ARTERY BYPASS GRAFT     at [facility]    Family History  Problem Relation Age of Onset   Dementia Mother    Liver disease Father    Diabetes Sister    Cirrhosis Brother     Social History   Socioeconomic History   Marital status: Married    Spouse name: Not on file   Number of children: 2   Years of education: Not on file   Highest education level: Not on file  Occupational History   Not on file  Tobacco Use   Smoking status: Former    Types: Cigarettes    Quit date: 04/11/1968    Years since quitting: 53.5   Smokeless tobacco: Never   Tobacco comments:    quit in the 1970s  Vaping Use   Vaping Use: Never used  Substance and  Sexual Activity   Alcohol use: Yes    Comment: Rare   Drug use: No   Sexual activity: Not on file  Other Topics Concern   Not on file  Social History Narrative   Right handed   One story home   Drinks no caffeine   Social Determinants of Health   Financial Resource Strain: Not on file  Food Insecurity: Not on file  Transportation Needs: Not on file  Physical Activity: Not on file  Stress: Not on file  Social Connections: Not on file  Intimate Partner Violence: Not on file    Review of Systems  Constitutional:  Negative for chills, fever and weight loss.  Eyes:  Negative for blurred vision and double vision.  Respiratory:  Negative for cough, sputum production, shortness of breath and wheezing.   Cardiovascular:  Negative for chest pain, palpitations, orthopnea, leg swelling and PND.  Gastrointestinal:  Positive for constipation. Negative for abdominal pain, diarrhea, nausea and vomiting.  Genitourinary:  Negative for dysuria and frequency.  Neurological:  Negative for dizziness, sensory change, speech change, loss of consciousness, weakness and headaches.        Objective    BP 114/67 (  BP Location: Left Arm, Patient Position: Sitting, Cuff Size: Normal)   Pulse 62   Temp 97.8 F (36.6 C) (Oral)   Ht '5\' 5"'$  (1.651 m)   Wt 170 lb 11.2 oz (77.4 kg)   SpO2 100%   BMI 28.41 kg/m   Physical Exam Constitutional:      Appearance: Normal appearance.  Neck:     Vascular: No carotid bruit.  Cardiovascular:     Rate and Rhythm: Normal rate and regular rhythm.     Pulses: Normal pulses.     Heart sounds: Normal heart sounds. No murmur heard. Pulmonary:     Effort: Pulmonary effort is normal. No respiratory distress.     Breath sounds: Normal breath sounds. No wheezing or rales.  Abdominal:     General: Bowel sounds are normal.  Neurological:     Mental Status: He is alert.     Sensory: No sensory deficit.     Gait: Gait normal.     Comments: CN 2-12 intact, no  deficits to touch or vibration, 5/5 strength bilaterally about all joints of the upper and lower extremity. No abnormalities on romberg and pronator drift testing. Tinel and Phalen test negative.   Psychiatric:        Mood and Affect: Mood normal.        Behavior: Behavior normal.         Assessment & Plan:   Problem List Items Addressed This Visit       Cardiovascular and Mediastinum   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 [facility])     July 2023 found to have L siphon ICA stenosis causing mca distribution infarct. 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 Patient denies any orthostasis, changes in vision, difficulty with talking or word finding, difficulty with chewing or swallowing, sensory or motor deficits. On exam CN 2-12 intact, no deficits to touch or vibration, 5/5 strength bilaterally about all joints of the upper and lower extremity, no abnormalities on romberg and pronator drift testing. Following with neurology with 6 month follow up. Also following with cardiology. Lipid panel found to have elevated LDL to 134 while hospitalized and started on a statin. Patient had an A1c at that time of 6.4% consistent with pre-diabetes, but being managed with mediterranean diet and lifestyle changes. BP was 114/ 67 and continues to be well controlled. Patient does not smoke or drink. -DAPT for 3 month with plavix '75mg'$  daily and ASA 81 daily -Rosuvastatin 20 mg daily, repeat lipid panel @ 3 month follow up -cardiac event monitor 30 days         Other   Neuropathic pain of hand, right    Patient reports shooting pain that goes into his right wrist and forearm, accompanied by numbness more on the thumb side of his hand. 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 Tinel and phalen tests were negative. This seems most consistent with carpal tunnel syndrome. This could represent central post stroke pain  syndrome, but this is less likely given that is preceded the most recent left MCA stroke. -advised patient to get a OTC splint and apply at night and with heavy wrist flexion during work       No follow-ups on file.   Iona Coach, MD

## 2021-10-21 NOTE — Assessment & Plan Note (Signed)
Patient reports shooting pain that goes into his right wrist and forearm, accompanied by numbness more on the thumb side of his hand. 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 Tinel and phalen tests were negative. This seems most consistent with carpal tunnel syndrome. This could represent central post stroke pain syndrome, but this is less likely given that is preceded the most recent left MCA stroke. -advised patient to get a OTC splint and apply at night and with heavy wrist flexion during work

## 2021-10-21 NOTE — Patient Instructions (Signed)
Thank you, Mr.Wolfe Fout for allowing [place] to provide your care today. Today we discussed :  Stroke- You had a stroke and were seen in the hospital for this. I did not find any weakness or sensory deficits on my exam. You have follow up with your neurologist in 6 months. Until then, continue to wear you cardiac event monitor for 30. Also continue taking your aspirin, plavix and cholesterol medication as these are the best ways to prevent future stroke. Your blood sugar and blood pressure are all in control, and keeping them this way will also help to prevent stroke. Continue to abstain from alcohol and smoking.  Hand tingling and numbness- This sounds like carpal tunnel. Please obtain an over the counter wrist splint and wear this during work and at night. I do not think this is B12 deficiency as this would be in all 4 extremities, cause loss of vibratory sensation, and loss of balance. You do not have these symptoms.      Follow up: 3 months     We look forward to seeing you next time. Please call our clinic at [phone] if you have any questions or concerns. The best time to call is Monday-Friday from 9am-4pm, but there is someone available 24/7. If after hours or the weekend, call the main hospital number and ask for the Internal Medicine Resident On-Call. If you need medication refills, please notify your pharmacy one week in advance and they will send [place] a request.   Thank you for trusting me with your care. Wishing you the best!   Iona Coach MD [facility]

## 2021-10-22 NOTE — Progress Notes (Signed)
Internal Medicine Clinic Attending  I saw and evaluated the patient.  I personally confirmed the key portions of the history and exam documented by Dr. Stann Mainland and I reviewed pertinent patient test results.  The assessment, diagnosis, and plan were formulated together and I agree with the documentation in the resident's note.    New patient here to establish care (we'll be his new PCP) after recent stroke. He has no residual deficits from stroke and is doing well. Taking DAPT & statin, saw Neuro yesterday, and has his 30-day event monitor from cardiology.  No major changes made today, will follow up in 3 months.

## 2021-10-26 ENCOUNTER — Ambulatory Visit: Payer: Medicare PPO

## 2021-10-26 ENCOUNTER — Encounter: Payer: Self-pay | Admitting: Occupational Therapy

## 2021-10-26 ENCOUNTER — Ambulatory Visit: Payer: Medicare PPO | Admitting: Physical Therapy

## 2021-10-26 ENCOUNTER — Ambulatory Visit: Payer: Medicare PPO | Attending: Family Medicine | Admitting: Occupational Therapy

## 2021-10-26 DIAGNOSIS — R41840 Attention and concentration deficit: Secondary | ICD-10-CM | POA: Diagnosis present

## 2021-10-26 DIAGNOSIS — R2681 Unsteadiness on feet: Secondary | ICD-10-CM | POA: Diagnosis present

## 2021-10-26 DIAGNOSIS — M6281 Muscle weakness (generalized): Secondary | ICD-10-CM | POA: Diagnosis present

## 2021-10-26 DIAGNOSIS — R278 Other lack of coordination: Secondary | ICD-10-CM | POA: Diagnosis not present

## 2021-10-26 DIAGNOSIS — R41841 Cognitive communication deficit: Secondary | ICD-10-CM | POA: Insufficient documentation

## 2021-10-26 DIAGNOSIS — R41842 Visuospatial deficit: Secondary | ICD-10-CM | POA: Insufficient documentation

## 2021-10-26 NOTE — Therapy (Signed)
OUTPATIENT OCCUPATIONAL THERAPY NEURO EVALUATION  Patient Name: [name] [name] MRN: [id number] DOB:23-Aug-1945, 76 y.o., male Today's Date: 10/26/2021  PCP: Iona Coach REFERRING PROVIDER:  Elodia Florence   OT End of Session - 10/26/21 1155     Visit Number 1    Number of Visits 9    Date for OT Re-Evaluation 12/27/21    Authorization Type [employer] MCR - form submitted    Progress Note Due on Visit 10    OT Start Time 1015    OT Stop Time 1100    OT Time Calculation (min) 45 min    Activity Tolerance Patient tolerated treatment well    Behavior During Therapy WFL for tasks assessed/performed             Past Medical History:  Diagnosis Date   Constipation    Diabetes mellitus without complication ([place])    borderline/ pre diabetic   NSTEMI (non-ST elevated myocardial infarction) ([place])    at [place]   Stroke ([place])    L side - 40 years ago   Past Surgical History:  Procedure Laterality Date   CARDIAC CATHETERIZATION     CORONARY ARTERY BYPASS GRAFT     at [facility]   Patient Active Problem List   Diagnosis Date Noted   Neuropathic pain of hand, right 10/21/2021   Syncope 10/07/2021   Prediabetes 10/07/2021   Stroke ([place]) 10/07/2021   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 ([place])    Acute ischemic stroke ([place]) 10/06/2021   CAD (coronary artery disease) 10/06/2021   PVD (peripheral vascular disease) ([place]) 04/15/2019   Dyslipidemia, goal LDL below 70 04/15/2019   History of stroke 04/15/2019   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 I feel like the last 6 months to a year, I've been stumbling more but I've always caught myself Pt accompanied by: significant other  PERTINENT HISTORY: medical history significant for CAD with CABG in 2017, history of CVA in the 1980s, peripheral arterial disease, and prediabetes who presents emergency department after syncopal episode yesterday.   IMPRESSION: 1. 4.7 cm acute to early subacute left MCA distribution infarct. No associated hemorrhage or significant mass effect. 2. No other acute intracranial abnormality. 3. 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 4. Underlying age-related cerebral atrophy with mild chronic small vessel ischemic disease.  PRECAUTIONS: Other: heart monitor, no driving  WEIGHT BEARING RESTRICTIONS No  PAIN:  Are you having pain? No  FALLS: Has patient fallen in last 6 months? Yes. Number of falls 1  LIVING ENVIRONMENT: Lives with: lives alone Lives in: House/apartment Pt lives in 1 story home w/ 6 steps to enter w/ railing on both sides Has following equipment at home: None  PLOF: Independent and retired  PATIENT GOALS not sure  OBJECTIVE:   HAND DOMINANCE: Right  ADLs: Transfers/ambulation related to ADLs: Eating: mod I w/ some assistance from Lt hand Grooming: mod I w/ some assistance from Lt hand UB Dressing: independent LB Dressing: independent Toileting: independent Bathing: independent Tub Shower transfers: tub/shower combo, mod I w/ some difficulty Equipment: none  IADLs: Shopping: stays in car, wife does Light housekeeping: wife is doing (wife did majority prior to stroke)  Meal Prep: dependent (pt did very little cooking before the stroke) Community mobility: relies on wife currently Medication management: wife oversees but pt reports he remembers Doctor, hospital: wife always did Handwriting: 100% legible (in print)   MOBILITY STATUS: Independent   UPPER EXTREMITY ROM   BUE AROM  WFL's    UPPER EXTREMITY MMT:   RUE MMT grossly 5/5, LUE MMT grossly 4/5 (always been weaker since older stroke in "80's)    HAND FUNCTION: Grip strength: Right: 84  lbs; Left: 80 lbs  COORDINATION: 9 Hole Peg test: Right: 30  sec; Left: 35.90  sec (Lt side impaired from old strokes in '80's) Noted decreased ability to touch fingertips Rt hand (? Apraxia)   SENSATION: Light touch: WFL  EDEMA: MP joint swelling worse on Rt hand  MUSCLE TONE: none   COGNITION: Overall cognitive status:  pt w/ possible mild apraxia and Rt inattention  VISION: Subjective report: I think most things have resolved Baseline vision: Wears glasses for distance only (w/ prisms) Visual history:  cataract surgery Rt eye, then Lt eye  VISION ASSESSMENT: WFL for visual fields and OROM. Pt appears slightly impaired w/ tracking (? Attention)   PERCEPTION: Impaired: mild Rt inattention  PRAXIS: Impaired: Motor planning  OBSERVATIONS: Difficulty for pt to id current deficits and what has changed since recent stroke   TODAY'S TREATMENT:  N/A   PATIENT EDUCATION: Education details: OT POC Person educated: Patient and Spouse Education method: Explanation Education comprehension: verbalized understanding   HOME EXERCISE PROGRAM: Not yet addressed    GOALS: Goals reviewed with patient? Yes   LONG TERM GOALS: Target date: 12/27/21  Independent w/ HEP for BUE endurance and bilateral coordination Baseline:  Goal status: INITIAL  2.  Pt to attend to Rt hand for functional tasks, especially to release items when appropriate Baseline:  Goal status: INITIAL  3.  Pt to perform environmental scanning w/ 85% accuracy or greater Baseline:  Goal status: INITIAL  4.  Pt to return to performing light IADLS (laundry/washing dishes) Baseline:  Goal status: INITIAL  5.  Pt to demo picking up items off floor w/o LOB in prep for light yardwork Baseline:  Goal status:  INITIAL   ASSESSMENT:  CLINICAL IMPRESSION: Patient is a 76  y.o. male who was seen today for occupational therapy evaluation for new Lt CVA admitted to hospital on 10/06/21. Pt w/ mild Rt inattention, motor planning deficits Rt hand, decreased endurance and balance, and residual Lt weakness from old strokes (in 1980's). Pt would benefit from O.T. to address these deficits and maximize safety w/ ADLS.  PERFORMANCE DEFICITS in functional skills including ADLs, IADLs, coordination, dexterity, strength, FMC, GMC, mobility, body mechanics, endurance, decreased knowledge of precautions, and UE functional use, cognitive skills including attention and safety awareness,   IMPAIRMENTS are limiting patient from IADLs and leisure.   COMORBIDITIES may have co-morbidities  that affects occupational performance. Patient will benefit from skilled OT to address above impairments and improve overall function.  MODIFICATION OR ASSISTANCE TO COMPLETE EVALUATION: No modification of tasks or assist necessary to complete an evaluation.  OT OCCUPATIONAL PROFILE AND HISTORY: Problem focused assessment: Including review of records relating to presenting problem.  CLINICAL DECISION MAKING: Moderate - several treatment options, min-mod task modification necessary  REHAB POTENTIAL: Good  EVALUATION COMPLEXITY: Low    PLAN: OT FREQUENCY: 2x/week  OT DURATION: 4 weeks (  PLUS EVAL)   PLANNED INTERVENTIONS: self care/ADL training, therapeutic exercise, therapeutic activity, neuromuscular re-education, manual therapy, passive range of motion, functional mobility training, moist heat, patient/family education, cognitive remediation/compensation, visual/perceptual remediation/compensation, coping strategies training, DME and/or AE instructions, and Re-evaluation  RECOMMENDED OTHER SERVICES: NONE  CONSULTED AND AGREED WITH PLAN OF CARE: Patient and family member/caregiver  PLAN FOR NEXT SESSION: HEP for BUE endurance  and bilateral hand coordination   Hans Eden, OT 10/26/2021, 12:01 PM

## 2021-10-26 NOTE — Therapy (Unsigned)
OUTPATIENT SPEECH LANGUAGE PATHOLOGY EVALUATION ONLY   Patient Name: [name] [name] MRN: [id number] DOB:03-13-1946, 76 y.o., male Today's Date: 10/26/2021  PCP: [name], MD REFERRING PROVIDER: Elodia Florence., MD   End of Session - 10/26/21 1537     Visit Number 1    Number of Visits 1    Authorization Type [employer] Medicare    SLP Start Time 18    SLP Stop Time  1017    SLP Time Calculation (min) 45 min    Activity Tolerance Patient tolerated treatment well             Past Medical History:  Diagnosis Date   Constipation    Diabetes mellitus without complication ([place])    borderline/ pre diabetic   NSTEMI (non-ST elevated myocardial infarction) ([place])    at [place]   Stroke ([place])    L side - 40 years ago   Past Surgical History:  Procedure Laterality Date   CARDIAC CATHETERIZATION     CORONARY ARTERY BYPASS GRAFT     at [facility]   Patient Active Problem List   Diagnosis Date Noted   Neuropathic pain of hand, right 10/21/2021   Syncope 10/07/2021   Prediabetes 10/07/2021   Stroke ([place]) 10/07/2021   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 ([place])    Acute ischemic stroke ([place]) 10/06/2021   CAD (coronary artery disease) 10/06/2021   PVD (peripheral vascular disease) ([place]) 04/15/2019   Dyslipidemia, goal LDL below 70 04/15/2019   History of stroke 04/15/2019   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    ONSET DATE: 10-06-21   REFERRING DIAG: I63.50 (ICD-10-CM) - 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 male with medical history significant for CAD with CABG in 2017, history of CVA in the 1980s, peripheral arterial disease, and prediabetes who presents emergency department after  syncopal episode yesterday.  The patient reports that he was having a normal day and doing some work outside when he bent over to start his weedeater and had a brief loss of consciousness.  No appreciable injury and he returned to his usual state fairly quickly but had not told his wife what happened until earlier today and she urged him to seek evaluation in the ED.  He denies any focal numbness or weakness at this time.  He relates that he had an episode in [place] approximately 3 weeks ago where he had been working outside in the heat and then felt disoriented and had some dizziness.  The symptoms had since resolved; MRI brain on 10/06/21 indicated 4.7 cm acute to early subacute left MCA distribution infarct. No  associated hemorrhage or significant mass effect.  2. No other acute intracranial abnormality.  3. 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 No   FALLS: Has patient fallen in last 6 months?  No  LIVING ENVIRONMENT: Lives with: lives with their spouse Lives in: House/apartment  PLOF:  Level of assistance: Independent with ADLs, Independent with IADLs Employment: Retired Psychologist, occupational professor)  OBJECTIVE:   COGNITION: Overall cognitive status: Within functional limits for tasks assessed Functional deficits: Denied any changes in short term recall, attention, and problem solving. Only minimal changes reported by patient and wife were very rare word finding  and occasional extra processing time needed, which has improved since hospital discharge, and holding items in hand (OT to address).  COGNITIVE COMMUNICATION Following directions: Follows multi-step commands consistently  Auditory comprehension: WFL Verbal expression: WFL  Functional communication: WFL (very rare word finding reported, 1-2x recently; takes some extra time to formulate ideas but pt endorsed ability to communicate effectively which was evidenced today. No overt word finding exhibited  or indicated. No dysarthria reported or demonstrated)  ORAL MOTOR EXAMINATION Overall status: WFL   PATIENT REPORTED OUTCOME MEASURES (PROM): Cognitive function: Short Form: 35 (rarely/a little for a few select items - most were not new or significant changes)  PATIENT EDUCATION: Education details: evaluation results Person educated: Patient and Spouse Education method: Explanation and Demonstration Education comprehension: verbalized understanding and returned demonstration  ASSESSMENT:  CLINICAL IMPRESSION: Patient is a 76 y.o. male who was seen today for CVA in late June 2023. Prior hx of CVA in 40s. Thorough questioning and interviewing of patient and wife revealed there are no current overt cognitive linguistic changes impacting functional abilities since most recent CVA. Any prior deficits (less talkative, delayed processing) have reportedly resolved with wife indicating patient is at/near prior baseline. No overt dysarthria or aphasia demonstrated in extended conversation. Pt and wife denied any cognitive changes related to short term recall, attention, or problem solving. No difficulty with reading or writing reported. In mod complex conversation, pt able to demonstrate functional recall and problem solving when discussing his medical history. No swallowing difficulty endorsed. Some possible inattention/neglect reported for retaining items in right hand; OT plans to address. Given no significant functional changes in cognitive linguistic skills reported or exhibited, ST evaluation only at this time. Both patient and wife aware of ability to request repeat referral if any cognitive communication changes or decline exhibited.   PLAN: SLP FREQUENCY: 1x/week  SLP DURATION: other: [place], [place] 10/26/2021, 3:38 PM

## 2021-10-26 NOTE — Therapy (Signed)
OUTPATIENT PHYSICAL THERAPY NEURO EVALUATION   Patient Name: [name] [name] MRN: [id number] DOB:Oct 23, 1945, 76 y.o., male Today's Date: 10/26/2021   PCP: [name], MD REFERRING PROVIDER: Elodia Florence., MD    PT End of Session - 10/26/21 1109     Visit Number 1    Number of Visits 5   Plus eval   Date for PT Re-Evaluation 11/30/21    Authorization Type [employer] Medicare    Progress Note Due on Visit 10    PT Start Time 1106    PT Stop Time 1145    PT Time Calculation (min) 39 min    Activity Tolerance Patient tolerated treatment well    Behavior During Therapy WFL for tasks assessed/performed             Past Medical History:  Diagnosis Date   Constipation    Diabetes mellitus without complication ([place])    borderline/ pre diabetic   NSTEMI (non-ST elevated myocardial infarction) ([place])    at [place]   Stroke ([place])    L side - 40 years ago   Past Surgical History:  Procedure Laterality Date   CARDIAC CATHETERIZATION     CORONARY ARTERY BYPASS GRAFT     at [facility]   Patient Active Problem List   Diagnosis Date Noted   Neuropathic pain of hand, right 10/21/2021   Syncope 10/07/2021   Prediabetes 10/07/2021   Stroke ([place]) 10/07/2021   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 ([place])    Acute ischemic stroke ([place]) 10/06/2021   CAD (coronary artery disease) 10/06/2021   PVD (peripheral vascular disease) ([place]) 04/15/2019   Dyslipidemia, goal LDL below 70 04/15/2019   History of stroke 04/15/2019   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    ONSET DATE: 10/07/2021 (referral)  REFERRING DIAG: I63.50 (ICD-10-CM) - 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 "Things have been getting better". States he often carries things from one room to another throughout the house (carrying bath towel in hand out of bathroom to kitchen and being unaware of carrying it). Reports he is walking outside after dinner on grass/large gravel/concrete with no  issues.   Pt accompanied by: significant other(wife, [name])  PERTINENT HISTORY: CAD with CABG in 2017, history of CVA in the 1980s, peripheral arterial disease, and prediabetes   PAIN:  Are you having pain? No  PRECAUTIONS: Fall  WEIGHT BEARING RESTRICTIONS No  FALLS: Has patient fallen in last 6 months? Yes. Number of falls 1 - pt fainted when he had the stroke  LIVING ENVIRONMENT: Lives with: lives with their spouse Lives in: House/apartment Stairs: Yes: External: 4 +1 steps; can reach both Has following equipment at home: None  PLOF: Independent  PATIENT GOALS "I wanna work on my coordination"  OBJECTIVE:   DIAGNOSTIC FINDINGS: MRI on 10/06/21  IMPRESSION: 1. 4.7 cm acute to early subacute left MCA distribution infarct. No associated hemorrhage or significant mass effect. 2. No other acute intracranial abnormality. 3. 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 4. Underlying age-related cerebral atrophy with mild chronic small vessel ischemic disease.  COGNITION: Overall cognitive status: Within functional limits for tasks assessed   SENSATION: Pt reports mild tingling in R hemibody when he first wakes up that dissipates w/movement   COORDINATION: Heel to shin: WFL bilaterally Finger to nose: significant dysmetria w/overshooting bilaterally (LUE>RUE) and impaired sustained attention requiring concurrent max verbal cues to complete task   EDEMA:  Reports he used to have a lot of swelling in BLEs to the point where he had  to stop walking due to the extra weight in his legs. Since the CVA, has not had much swelling    POSTURE: rounded shoulders, forward head, and posterior pelvic tilt   LOWER EXTREMITY MMT:  Tested in seated position  MMT Right Eval Left Eval  Hip flexion 4 4-  Hip extension    Hip abduction 4 4-  Hip adduction 4 4-  Hip internal rotation    Hip external rotation    Knee flexion 5 4+  Knee extension 5 4+  Ankle dorsiflexion 4+ 4-  Ankle plantarflexion    Ankle inversion    Ankle eversion    (Blank rows = not tested)  BED MOBILITY:  Independent per pt   TRANSFERS: Assistive device utilized: None  Sit to stand: Modified independence Stand to sit: Modified independence   STAIRS:  Level of Assistance: Modified independence  Stair Negotiation Technique: Alternating Pattern  with Bilateral Rails  Number of Stairs: 12   Height of Stairs: 6"  Comments: No instability noted, noted circumduction to ascend LLE  GAIT: Gait pattern: step through pattern, decreased arm swing- Right, decreased step length- Left, decreased stride length, decreased hip/knee flexion- Left, decreased ankle dorsiflexion- Right, circumduction- Left, Left hip hike, lateral lean- Right, trunk rotated posterior- Right, and wide BOS Distance walked: Various clinic distances  Assistive device utilized: None Level of assistance: Modified independence Comments: Pt demonstrates   FUNCTIONAL TESTs:   [facility] PT Assessment - 10/26/21 0001       Transfers   Five time sit to stand comments  14.06 sec with no UE support      Ambulation/Gait   Gait velocity 32.8' over 11.35s = 2.88 ft/s without AD      Balance   Balance Assessed Yes      Standardized Balance Assessment   Standardized Balance Assessment Timed Up and Go Test      Timed Up and Go Test   Normal TUG (seconds) 10.97             TODAY'S TREATMENT:  Next session    PATIENT EDUCATION: Education details:  Eval findings, POC  Person  educated: Patient and Spouse Education method: Explanation and Demonstration Education comprehension: verbalized understanding and needs further education   HOME EXERCISE PROGRAM: To be established next session     GOALS: Goals reviewed with patient? Yes STG = LTG DUE TO POC LENGTH  LONG TERM GOALS: Target date: 11/23/2021  Pt will be independent with final HEP for improved strength, balance, transfers and gait.  Baseline:  Goal status: INITIAL  2.  Pt will improve gait velocity to at least 3.2 ft/s for improved gait efficiency   Baseline: 2.88 ft/s  Goal status: INITIAL  3.  HiMat to be assessed and goal written  Baseline:  Goal status: INITIAL  4.  Pt will verbalize plan for fitness post DC for continued functional mobility and strength gains.  Baseline:  Goal status: INITIAL  ASSESSMENT:  CLINICAL IMPRESSION: Patient is a 76 year old male referred to Neuro OPPT for CVA. Pt's PMH is significant for: CAD with CABG in 2017, history of CVA in the 1980s, peripheral arterial disease, and prediabetes. The following deficits were present during the exam: impaired sustained attention, potential bilateral inattention, L hemibody weakness and decreased safety awareness. Based on gait speed and attention impairments, pt is an incr risk for falls. Will further assess balance next session. Pt would benefit from skilled PT to address these impairments and functional limitations to maximize functional mobility independence.   OBJECTIVE IMPAIRMENTS Abnormal gait, decreased balance, decreased cognition, decreased coordination, decreased knowledge of condition, decreased mobility, difficulty walking, decreased strength, and decreased safety awareness.   ACTIVITY LIMITATIONS carrying, lifting, bending, and squatting  PARTICIPATION LIMITATIONS: cleaning, laundry, driving, community activity, and yard work  PERSONAL FACTORS Past/current experiences, Time since onset of  injury/illness/exacerbation, and 1 comorbidity: chronic CVA  are also affecting patient's functional outcome.   REHAB POTENTIAL: Good  CLINICAL DECISION MAKING: Stable/uncomplicated  EVALUATION COMPLEXITY: Low  PLAN: PT FREQUENCY: 1x/week  PT DURATION: 4 weeks  PLANNED INTERVENTIONS: Therapeutic exercises, Therapeutic activity, Neuromuscular re-education, Balance training, Gait training, Patient/Family education, Self Care, and Joint mobilization  PLAN FOR NEXT SESSION: HiMat, establish initial HEP based on balance impairments, LLE NMR and sustained attention    Cherae Marton E Emaad Nanna, PT, DPT 10/26/2021, 11:50 AM

## 2021-11-01 ENCOUNTER — Ambulatory Visit: Payer: Medicare PPO | Admitting: Physical Therapy

## 2021-11-01 DIAGNOSIS — R2681 Unsteadiness on feet: Secondary | ICD-10-CM

## 2021-11-01 DIAGNOSIS — M6281 Muscle weakness (generalized): Secondary | ICD-10-CM

## 2021-11-01 DIAGNOSIS — R278 Other lack of coordination: Secondary | ICD-10-CM | POA: Diagnosis not present

## 2021-11-01 NOTE — Therapy (Signed)
OUTPATIENT PHYSICAL THERAPY NEURO TREATMENT   Patient Name: [name] [name] MRN: [id number] DOB:01/08/1946, 76 y.o., male Today's Date: 11/01/2021   PCP: [name], MD REFERRING PROVIDER: Elodia Florence., MD    PT End of Session - 11/01/21 1102     Visit Number 2    Number of Visits 5   Plus eval   Date for PT Re-Evaluation 11/30/21    Authorization Type [employer]    Progress Note Due on Visit 10    PT Start Time 1101    PT Stop Time 1144    PT Time Calculation (min) 43 min    Equipment Utilized During Treatment Gait belt    Activity Tolerance Patient tolerated treatment well    Behavior During Therapy WFL for tasks assessed/performed              Past Medical History:  Diagnosis Date   Constipation    Diabetes mellitus without complication ([place])    borderline/ pre diabetic   NSTEMI (non-ST elevated myocardial infarction) ([place])    at [place]   Stroke ([place])    L side - 40 years ago   Past Surgical History:  Procedure Laterality Date   CARDIAC CATHETERIZATION     CORONARY ARTERY BYPASS GRAFT     at [facility]   Patient Active Problem List   Diagnosis Date Noted   Neuropathic pain of hand, right 10/21/2021   Syncope 10/07/2021   Prediabetes 10/07/2021   Stroke ([place]) 10/07/2021   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 ([place])    Acute ischemic stroke ([place]) 10/06/2021   CAD (coronary artery disease) 10/06/2021   PVD (peripheral vascular disease) ([place]) 04/15/2019   Dyslipidemia, goal LDL below 70 04/15/2019   History of stroke 04/15/2019   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 He has been walking around the yard, stretching, moving his arms around and exercising them. Pt states he feels that his L knee gets stiff at times especially in the colder weather. Pt reports he tried to walk "  heel/toe" with LLE but finds it difficult at times.  Pt accompanied by: significant other(wife, [name])  PERTINENT HISTORY: CAD with CABG in 2017, history of CVA in the 1980s, peripheral arterial disease, and prediabetes   PAIN:  Are you having pain? No  PRECAUTIONS: Fall   FALLS: Has patient fallen in last 6 months? Yes. Number of falls 1 - pt fainted when he had the stroke   PATIENT GOALS "I wanna work on my coordination"  OBJECTIVE:   DIAGNOSTIC FINDINGS: MRI on 10/06/21  IMPRESSION: 1. 4.7 cm acute to early subacute left MCA distribution infarct. No associated hemorrhage or significant mass effect. 2. No other acute intracranial abnormality. 3. 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 4. Underlying age-related cerebral atrophy with mild chronic small vessel ischemic disease.   COORDINATION: Heel to shin: WFL bilaterally Finger to nose: significant dysmetria w/overshooting bilaterally (LUE>RUE) and impaired sustained attention requiring concurrent max verbal cues to complete task    TODAY'S TREATMENT:  THER EX/NMR: - Forward Step Up with Counter Support  - 1 x daily - 7 x weekly - 3 sets - 10 reps - Lateral Step Up with Counter Support  - 1 x daily - 7 x weekly - 3 sets - 10 reps - Side Stepping with Counter Support With Red Theraband - 1 x daily - 7 x weekly - 3 sets - 10 reps - Seated Hamstring Curl with Anchored Resistance (Blue Theraband for RLE, Red Theraband for LLE) - 1 x daily - 7 x weekly - 3 sets - 10  reps   GAIT: Gait pattern: step through pattern, decreased arm swing- Right, decreased step length- Left, decreased stride length, decreased hip/knee flexion- Left, decreased ankle dorsiflexion- Right, circumduction- Left, Left hip hike, lateral lean- Right, trunk rotated posterior- Right, and wide BOS Distance walked: Various clinic distances  Assistive device utilized: None Level of assistance: Modified independence Comments: Pt demonstrates ongoing decreased LLE clearance, decreased LLE hip and knee flexion, and decreased heel strike with LLE during gait.  FUNCTIONAL TESTS: HiMat Assessment SCORE  ITEM PERFORMANCE   0     '1        2       3      4     5  '$ WALK 10.44 sec  X >6.6 5.4-6.6 4.3-5.3 <4.3     X  WALK BACKWARD 38.15 sec  >13.3 8.1-13.3 5.8-8.0 <5.8     X  WALK ON TOES   X >8.9 7.0-8.9 5.4-6.9 <5.4     X  WALK OVER OBSTACLE 13.69 sec  >7.1 5.4-7.1 4.5-5.3 <4.5     X  RUN   X >2.7 2.0-2.7 1.7-1.9 <1.7     X  SKIP   X >4.0 3.5-4.0 3.0-3.4 <3.0     X  HOP FORWARD (AFFECTED)   X >7.0 5.3-7.0 4.1-5.2 <4.1     X  BOUND (AFFECTED) 1)45.72 cm 2)51.82 cm 3)60.96 cm  <80 80-103 104-132 >132     X  BOUND (LESS-AFFECTED) 1)45.72 cm 2)30.48 cm 3)30.48 cm  <82 82-105 106-129 >129     X  UP STAIRS DEPENDENT 21.6 sec  >22.8 14.6-22.8 12.3-14.5 <12.3   UP STAIRS INDEPENDENT   X >9.1 7.6-9.1 6.8-7.5 <6.8     X  DOWN STAIRS DEPENDENT 21.6 sec  >24.3 17.6-24.3 12.8-17.5 <12.8   DOWN STAIRS INDEPENDENT   X >8.4 6.6-8.4 5.8-6.5 <5.8     X   SUBTOTAL 0 5 4 0 0 0  TOTAL  SCORE 9/54           Pt scores 9/54 on the HiMat.   PATIENT EDUCATION: Education details: initiated HEP, focus on increasing L hip/knee flexion during gait and working on heel strike Person educated: Patient and Spouse Education method: Explanation, Media planner, and Handouts Education comprehension: verbalized understanding and needs further education   HOME EXERCISE PROGRAM: Access Code: [email or web address] URL:  [email or web address] Date: 11/01/2021 Prepared by: Excell Seltzer  Exercises - Forward Step Up with Counter Support  - 1 x daily - 7 x weekly - 3 sets - 10 reps - Lateral Step Up with Counter Support  - 1 x daily - 7 x weekly - 3 sets - 10 reps - Side Stepping with Counter Support With Red Theraband - 1 x daily - 7 x weekly - 3 sets - 10 reps - Seated Hamstring Curl with Anchored Resistance (Blue Theraband for RLE, Red Theraband for LLE) - 1 x daily - 7 x weekly - 3 sets - 10 reps   GOALS: Goals reviewed with patient? Yes STG = LTG DUE TO POC LENGTH  LONG TERM GOALS: Target date: 11/23/2021  Pt will be independent with final HEP for improved strength, balance, transfers and gait. Baseline:  Goal status: INITIAL  2.  Pt will improve gait velocity to at least 3.2 ft/s for improved gait efficiency   Baseline: 2.88 ft/s  Goal status: INITIAL  3.  Pt will improve HiMat score to 15/54 to demonstrate improved higher-level balance. Baseline: 9/54 (11/01/21) Goal status: REVISED  4.  Pt will verbalize plan for fitness post DC for continued functional mobility and strength gains.  Baseline:  Goal status: INITIAL  ASSESSMENT:  CLINICAL IMPRESSION: Emphasis of skilled PT session on assessing HiMat and initiating HEP with patient. Pt scores a 9/54 on the HiMat exhibiting the most difficulty with being able to run, skip, and hop on affected LE. Pt continues to exhibit gait deviations due to his most recent CVA as well as exhibiting residual deficits from his prior CVA as noted above. Initiated HEP with patient, see handout above. Continue POC.    PLAN: PT FREQUENCY: 1x/week  PT DURATION: 4 weeks  PLANNED INTERVENTIONS: Therapeutic exercises, Therapeutic activity, Neuromuscular re-education, Balance training, Gait training, Patient/Family education, Self Care, and Joint mobilization  PLAN FOR NEXT SESSION: assess effectiveness of HEP and add exercises if needed, LLE NMR  and sustained attention, dynamic balance training     Excell Seltzer, PT, DPT, CSRS 11/01/2021, 11:50 AM

## 2021-11-08 ENCOUNTER — Ambulatory Visit: Payer: Medicare PPO

## 2021-11-08 DIAGNOSIS — R278 Other lack of coordination: Secondary | ICD-10-CM | POA: Diagnosis not present

## 2021-11-08 DIAGNOSIS — M6281 Muscle weakness (generalized): Secondary | ICD-10-CM

## 2021-11-08 DIAGNOSIS — R2681 Unsteadiness on feet: Secondary | ICD-10-CM

## 2021-11-08 NOTE — Therapy (Signed)
OUTPATIENT PHYSICAL THERAPY NEURO TREATMENT   Patient Name: [name] [name] MRN: [id number] DOB:June 06, 1945, 76 y.o., male Today's Date: 11/08/2021   PCP: [name], MD REFERRING PROVIDER: Elodia Florence., MD    PT End of Session - 11/08/21 1100     Visit Number 3    Number of Visits 5    Date for PT Re-Evaluation 11/30/21    Authorization Type [employer] Medicare    Progress Note Due on Visit 10    PT Start Time 1101    PT Stop Time 1153    PT Time Calculation (min) 52 min    Activity Tolerance Patient tolerated treatment well    Behavior During Therapy WFL for tasks assessed/performed              Past Medical History:  Diagnosis Date   Constipation    Diabetes mellitus without complication ([place])    borderline/ pre diabetic   NSTEMI (non-ST elevated myocardial infarction) ([place])    at [place]   Stroke ([place])    L side - 40 years ago   Past Surgical History:  Procedure Laterality Date   CARDIAC CATHETERIZATION     CORONARY ARTERY BYPASS GRAFT     at [facility]   Patient Active Problem List   Diagnosis Date Noted   Neuropathic pain of hand, right 10/21/2021   Syncope 10/07/2021   Prediabetes 10/07/2021   Stroke ([place]) 10/07/2021   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 ([place])    Acute ischemic stroke ([place]) 10/06/2021   CAD (coronary artery disease) 10/06/2021   PVD (peripheral vascular disease) ([place]) 04/15/2019   Dyslipidemia, goal LDL below 70 04/15/2019   History of stroke 04/15/2019   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    ONSET DATE: 10/07/2021 (referral)  REFERRING DIAG: I63.50 (ICD-10-CM) - 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 Exercises going well. No falls.   Pt accompanied by: significant other(wife, [name])  PERTINENT HISTORY: CAD with CABG in 2017, history of CVA in the 1980s, peripheral arterial disease, and prediabetes   PAIN:  Are you having pain? No  VITALS:  BP: 110/63  PRECAUTIONS:  Fall   TODAY'S TREATMENT:  NMR:  -ladder drills: fwd stepping, lateral stepping "zipper" (required frequent and consistent cuing to complete accurately)  -rebounder: floor and foam- WBOS, NBOS, semi-tandem  -modified D1 5# ankle weight toe-> heel on 2nd step -lateral stepping + ball toss at wall (dual motor task)  -AFO vs foot up brace trial (no lace shoes avail, so simulated foot up brace with DF wrap)  PATIENT EDUCATION: Education details: AFO vs foot up, coordination of DF during gait  Person educated: Patient and Spouse Education method: Explanation, Demonstration, and Handouts Education comprehension: verbalized understanding and needs further education   HOME EXERCISE PROGRAM: Access Code: [email or web address] URL: [email or web address] Date: 11/01/2021 Prepared by: Excell Seltzer  Exercises - Forward Step Up with Counter Support  - 1 x daily - 7 x weekly - 3 sets - 10 reps - Lateral Step Up with Counter Support  - 1 x daily - 7 x weekly - 3 sets - 10 reps - Side Stepping with Counter Support With Red Theraband - 1 x daily - 7 x weekly - 3 sets - 10 reps - Seated Hamstring Curl with Anchored Resistance (Blue Theraband for RLE, Red Theraband for LLE) - 1 x daily - 7 x weekly - 3 sets - 10 reps   GOALS: Goals reviewed with patient? Yes STG = LTG DUE TO POC LENGTH  LONG TERM GOALS: Target date: 11/23/2021  Pt will be independent with final HEP for improved strength, balance, transfers and gait. Baseline:  Goal  status: INITIAL  2.  Pt will improve gait velocity to at least 3.2 ft/s for improved gait efficiency   Baseline: 2.88 ft/s  Goal status: INITIAL  3.  Pt will improve HiMat score to 15/54 to demonstrate improved higher-level balance. Baseline: 9/54 (11/01/21) Goal status: REVISED  4.  Pt will verbalize plan for fitness post DC for continued functional mobility and strength gains.  Baseline:  Goal status: INITIAL  ASSESSMENT:  CLINICAL IMPRESSION: Patient seen for skilled PT session with emphasis on L hemi NMR. He tolerated NMR exercises well, but demonstrates limited ability to safely complete dual motor tasks. He presents with L foot drop and decreased ability to dorsiflex through swing phase of gait increasing his risk for falling. PT trialing AFO vs foot up brace- however, patient did not have laced-shoes on. After wife to bring laced shoes for next session. With DF wrap applied- he had much improved foot clearance and DF through swing. Slight genu recurvatum noted, though. Continue POC.     PLAN: PT FREQUENCY: 1x/week  PT DURATION: 4 weeks  PLANNED INTERVENTIONS: Therapeutic exercises, Therapeutic activity, Neuromuscular re-education, Balance training, Gait training, Patient/Family education, Self Care, and Joint mobilization  PLAN FOR NEXT SESSION: assess effectiveness of HEP and add exercises if needed, LLE NMR and sustained attention, dynamic balance training, AFO vs foot up brace (did they bring laced sneakers?), dual motor tasking    Debbora Dus, PT, DPT Debbora Dus, PT, DPT, CBIS  11/08/2021, 12:03 PM

## 2021-11-15 ENCOUNTER — Ambulatory Visit: Payer: Medicare PPO | Attending: Family Medicine | Admitting: Occupational Therapy

## 2021-11-15 ENCOUNTER — Ambulatory Visit: Payer: Medicare PPO | Admitting: Physical Therapy

## 2021-11-15 ENCOUNTER — Encounter: Payer: Self-pay | Admitting: Physical Therapy

## 2021-11-15 VITALS — BP 112/71 | HR 74

## 2021-11-15 DIAGNOSIS — R278 Other lack of coordination: Secondary | ICD-10-CM | POA: Diagnosis present

## 2021-11-15 DIAGNOSIS — R2681 Unsteadiness on feet: Secondary | ICD-10-CM | POA: Diagnosis present

## 2021-11-15 DIAGNOSIS — M6281 Muscle weakness (generalized): Secondary | ICD-10-CM | POA: Insufficient documentation

## 2021-11-15 DIAGNOSIS — R41842 Visuospatial deficit: Secondary | ICD-10-CM | POA: Diagnosis present

## 2021-11-15 DIAGNOSIS — R41840 Attention and concentration deficit: Secondary | ICD-10-CM | POA: Insufficient documentation

## 2021-11-15 NOTE — Therapy (Signed)
OUTPATIENT PHYSICAL THERAPY NEURO TREATMENT   Patient Name: [name] [name] MRN: [id number] DOB:[birth year], 76 y.o., male Today's Date: 11/15/2021   PCP: [name], MD REFERRING PROVIDER: Elodia Florence., MD    PT End of Session - 11/15/21 1023     Visit Number 4    Number of Visits 5    Date for PT Re-Evaluation 11/30/21    Authorization Type [employer] Medicare    Progress Note Due on Visit 10    PT Start Time 1025   pt in restroom   PT Stop Time 1100    PT Time Calculation (min) 35 min    Equipment Utilized During Treatment Other (comment)   L foot up brace   Activity Tolerance Patient tolerated treatment well    Behavior During Therapy WFL for tasks assessed/performed               Past Medical History:  Diagnosis Date   Constipation    Diabetes mellitus without complication ([place])    borderline/ pre diabetic   NSTEMI (non-ST elevated myocardial infarction) ([place])    at [place]   Stroke ([place])    L side - 40 years ago   Past Surgical History:  Procedure Laterality Date   CARDIAC CATHETERIZATION     CORONARY ARTERY BYPASS GRAFT     at [facility]   Patient Active Problem List   Diagnosis Date Noted   Neuropathic pain of hand, right 10/21/2021   Syncope 10/07/2021   Prediabetes 10/07/2021   Stroke ([place]) 10/07/2021   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 ([place])    Acute ischemic stroke ([place]) 10/06/2021   CAD (coronary artery disease) 10/06/2021   PVD (peripheral vascular disease) ([place]) 04/15/2019   Dyslipidemia, goal LDL below 70 04/15/2019   History of stroke 04/15/2019   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    ONSET DATE: 10/07/2021 (referral)  REFERRING DIAG: I63.50 (ICD-10-CM) - 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 Pt's BP was normal when EMS assessed and pt thought to have mm spasm from PT exercises due to  working muscles he has not exercised in a while. No other issues since then. BP assessed this AM during PT session and WNL, see below.  Pt accompanied by: significant other(wife, [name])  PERTINENT HISTORY: CAD with CABG in 2017, history of CVA in the 1980s, peripheral arterial disease, and prediabetes   PAIN:  Are you having pain? No  VITALS:  Today's Vitals   11/15/21 1028  BP: 112/71  Pulse: 74    PRECAUTIONS: Fall   TODAY'S TREATMENT:  GAIT: Gait pattern: decreased step length- Left, decreased hip/knee flexion- Left, decreased ankle dorsiflexion- Left, and poor foot clearance- Left Distance walked: 230 ft Assistive device utilized: None Level of assistance: SBA Comments: Trial gait in clinic with use of L "foot-up" brace. Pt exhibits improved LLE clearance with use of brace, recommending that pt's wife purchase brace for use with ambulation. Pt does continue to exhibit ongoing L knee genu recurvatum during gait at times.  THER EX: Added to HEP, see bolded below for quad strengthening due to ongoing genu recurvatum  PATIENT EDUCATION:  Education details: purchase and use of foot up brace size L, HEP, plan for d/c next session Person educated: Patient and Spouse Education method: Explanation, Demonstration, and Handouts Education comprehension: verbalized understanding and needs further education   HOME EXERCISE PROGRAM: Access Code: [email or web address] URL: [email or web address] Date: 11/01/2021 Prepared by: Excell Seltzer  Exercises - Forward Step Up with  Counter Support  - 1 x daily - 7 x weekly - 3 sets - 10 reps - Lateral Step Up with Counter Support  - 1 x daily - 7 x weekly - 3 sets - 10 reps - Side Stepping with Counter Support With Red Theraband - 1 x daily - 7 x weekly - 3 sets - 10 reps - Seated Hamstring Curl with Anchored Resistance (Blue Theraband for RLE, Red Theraband for LLE) - 1 x daily - 7 x weekly - 3 sets - 10 reps - Standing Terminal Knee Extension with Resistance  - 1 x daily - 7 x weekly - 3 sets - 10 reps   GOALS: Goals reviewed with patient? Yes STG = LTG DUE TO POC LENGTH  LONG TERM GOALS: Target date: 11/23/2021  Pt will be independent with final HEP for improved strength, balance, transfers and gait. Baseline:  Goal status: INITIAL  2.  Pt will improve gait velocity to at least 3.2 ft/s for improved gait efficiency  Baseline: 2.88 ft/s  Goal status: INITIAL  3.  Pt will improve HiMat score to 15/54 to demonstrate improved higher-level balance. Baseline: 9/54 (11/01/21) Goal status: REVISED  4.  Pt will verbalize plan for fitness post DC for continued functional mobility and strength gains.  Baseline:  Goal status: INITIAL  ASSESSMENT:  CLINICAL IMPRESSION: Pt missed 10 min of scheduled therapy session initially due to being in the restroom. Emphasis of skilled PT session on addressing gait impairments due to LLE weakness with use of foot up brace. Pt exhibits improved LLE clearance with use of foot up brace and recommend pt's family purchase for use at home. Pt does exhibit some ongoing L knee genu recurvatum so worked on Music therapist with [employer], added to HEP. Pt and his wife aware that next visit will be d/c visit. Continue POC.     PLAN: PT FREQUENCY: 1x/week  PT DURATION: 4 weeks  PLANNED INTERVENTIONS: Therapeutic exercises, Therapeutic activity, Neuromuscular re-education, Balance training, Gait training, Patient/Family education, Self Care, and Joint mobilization  PLAN FOR NEXT  SESSION:  DISCHARGE NEXT SESSION, assess effectiveness of HEP and add exercises if needed for LLE NMR and sustained attention, dynamic balance training, assess foot up brace if pt has purchased, assess goals for d/c      Excell Seltzer, PT, DPT, CSRS  11/15/2021, 8:57 PM

## 2021-11-15 NOTE — Patient Instructions (Signed)
  Coordination Activities  Perform the following activities for 15 minutes 2 times per day with both hand(s), but focus more on Rt hand.  Rotate ball in fingertips (clockwise and counter-clockwise). Flip cards 1 at a time as fast as you can. Deal cards with your thumb (Hold deck in hand and push card off top with thumb). Rotate one card in hand (clockwise x 3 full revolutions, then counter-clockwise x 3 full revolutions). Pick up coins one at a time until you get 5 in your hand, then move from palm to fingertips one at a time to stack. Practice writing w/ Rt hand only  Screw together nuts and bolts, then unfasten.   SHOULDER: Flexion - Sitting    Hold cane with both hands. Raise arms up over head. Keep elbows straight. Hold _3__ seconds.  Repeat_10__ reps per set, __2_ sets per day   ROM: Abduction - Wand    Holding wand with left hand palm up, push wand directly out to Lt side, leading with other hand palm down, until stretch is felt. Hold _3___ seconds. Repeat __10__ times per set. Repeat to Rt side w/ Rt palm up x 10 reps. Do __2__ sessions per day.

## 2021-11-15 NOTE — Therapy (Signed)
OUTPATIENT OCCUPATIONAL THERAPY NEURO EVALUATION  Patient Name: [name] [name] MRN: [id number] DOB:10-07-1945, 76 y.o., male Today's Date: 11/15/2021  PCP: Iona Coach REFERRING PROVIDER:  Elodia Florence   OT End of Session - 11/15/21 [phone]     Visit Number 2    Number of Visits 9    Date for OT Re-Evaluation 12/27/21    Authorization Type [employer] MCR - form submitted    Progress Note Due on Visit 10    OT Start Time [id number]    OT Stop Time 1015    OT Time Calculation (min) 38 min    Activity Tolerance Patient tolerated treatment well    Behavior During Therapy WFL for tasks assessed/performed             Past Medical History:  Diagnosis Date   Constipation    Diabetes mellitus without complication ([place])    borderline/ pre diabetic   NSTEMI (non-ST elevated myocardial infarction) ([place])    at [place]   Stroke ([place])    L side - 40 years ago   Past Surgical History:  Procedure Laterality Date   CARDIAC CATHETERIZATION     CORONARY ARTERY BYPASS GRAFT     at [facility]   Patient Active Problem List   Diagnosis Date Noted   Neuropathic pain of hand, right 10/21/2021   Syncope 10/07/2021   Prediabetes 10/07/2021   Stroke ([place]) 10/07/2021   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 ([place])    Acute ischemic stroke ([place]) 10/06/2021   CAD (coronary artery disease) 10/06/2021   PVD (peripheral vascular disease) ([place]) 04/15/2019   Dyslipidemia, goal LDL below 70 04/15/2019   History of stroke 04/15/2019   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 I feel like the last 6 months to a year, I've been stumbling more but  I've always caught myself Pt accompanied by: significant other  PERTINENT HISTORY: medical history significant for CAD with CABG in 2017, history of CVA in the 1980s, peripheral arterial disease, and prediabetes who presents emergency department after syncopal episode yesterday.   IMPRESSION: 1. 4.7 cm acute to early subacute left MCA distribution infarct. No associated hemorrhage or significant mass effect. 2. No other acute intracranial abnormality. 3. 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 4. Underlying age-related cerebral atrophy with mild chronic small vessel ischemic disease.  PRECAUTIONS: Other: heart monitor, no driving  WEIGHT BEARING RESTRICTIONS No  PAIN:  Are you having pain? No   PLOF: Independent and retired  PATIENT GOALS not sure  OBJECTIVE:   HAND DOMINANCE: Right   COORDINATION: 9 Hole Peg test: Right: 30  sec; Left: 35.90  sec (Lt side impaired from old strokes in '80's) Noted decreased ability to touch fingertips Rt hand (? Apraxia)    PERCEPTION: Impaired: mild Rt inattention  PRAXIS: Impaired: Motor planning  OBSERVATIONS: Difficulty for pt to id current deficits and what has changed since recent stroke   TODAY'S TREATMENT:  Pt issued coordination and cane HEP - see pt instructions for details  PATIENT EDUCATION: Education details: coordination and cane HEP Person educated: Patient and Spouse Education method: Explanation, Demonstration, Verbal cues, and Handouts Education comprehension: verbalized  understanding, returned demonstration, and verbal cues required    HOME EXERCISE PROGRAM: 11/15/21: Coordination and cane HEP     GOALS: Goals reviewed with patient? Yes   LONG TERM GOALS: Target date: 12/27/21  Independent w/ HEP for BUE endurance and bilateral coordination Baseline:  Goal status: MET  2.  Pt to attend to Rt hand for functional tasks, especially to release items when appropriate Baseline:  Goal status:  INITIAL  3.  Pt to perform environmental scanning w/ 85% accuracy or greater Baseline:  Goal status: INITIAL  4.  Pt to return to performing light IADLS (laundry/washing dishes) Baseline:  Goal status: INITIAL  5.  Pt to demo picking up items off floor w/o LOB in prep for light yardwork Baseline:  Goal status: INITIAL   ASSESSMENT:  CLINICAL IMPRESSION: Pt met STG #1.   PERFORMANCE DEFICITS in functional skills including ADLs, IADLs, coordination, dexterity, strength, FMC, GMC, mobility, body mechanics, endurance, decreased knowledge of precautions, and UE functional use, cognitive skills including attention and safety awareness,   IMPAIRMENTS are limiting patient from IADLs and leisure.   COMORBIDITIES may have co-morbidities  that affects occupational performance. Patient will benefit from skilled OT to address above impairments and improve overall function.  MODIFICATION OR ASSISTANCE TO COMPLETE EVALUATION: No modification of tasks or assist necessary to complete an evaluation.  OT OCCUPATIONAL PROFILE AND HISTORY: Problem focused assessment: Including review of records relating to presenting problem.  CLINICAL DECISION MAKING: Moderate - several treatment options, min-mod task modification necessary  REHAB POTENTIAL: Good  EVALUATION COMPLEXITY: Low    PLAN: OT FREQUENCY: 2x/week  OT DURATION: 4 weeks (PLUS EVAL)   PLANNED INTERVENTIONS: self care/ADL training, therapeutic exercise, therapeutic activity, neuromuscular re-education, manual therapy, passive range of motion, functional mobility training, moist heat, patient/family education, cognitive remediation/compensation, visual/perceptual remediation/compensation, coping strategies training, DME and/or AE instructions, and Re-evaluation  RECOMMENDED OTHER SERVICES: NONE  CONSULTED AND AGREED WITH PLAN OF CARE: Patient and family member/caregiver  PLAN FOR NEXT SESSION: Review HEP prn, progress towards other  STG's   Hans Eden, OT 11/15/2021, 9:40 AM

## 2021-11-17 ENCOUNTER — Ambulatory Visit: Payer: Medicare PPO | Admitting: Occupational Therapy

## 2021-11-17 NOTE — Progress Notes (Unsigned)
Cardiology Clinic Note   Patient Name: [name] [name] Date of Encounter: 11/17/2021  Primary Care Provider:  Iona Coach, MD Primary Cardiologist:  Kirk Ruths, MD  Patient Profile    [name] [name] 76 year old male presents the clinic today for review of his cardiac event monitor.  Past Medical History    Past Medical History:  Diagnosis Date   Constipation    Diabetes mellitus without complication ([place])    borderline/ pre diabetic   NSTEMI (non-ST elevated myocardial infarction) ([place])    at [place]   Stroke ([place])    L side - 40 years ago   Past Surgical History:  Procedure Laterality Date   CARDIAC CATHETERIZATION     CORONARY ARTERY BYPASS GRAFT     at [facility]    Allergies  No Known Allergies  History of Present Illness    [name] [name] coronary artery disease status post CABG in 2017, CVA 1980's, peripheral arterial disease, and prediabetes.  He presented to the emergency department on 10/06/2021 and was discharged on 10/07/2021.  He reported a syncopal episode during previous stay.  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  He then had a brief episode of loss of consciousness.  He did not feel he was injured and did not tell his wife that he had had the event.  He reported the episode to his wife the following day and she urged him to seek evaluation in the emergency department.  He denied new weakness or numbness.  He indicated that he had a similar episode 3 weeks prior with his [place]of-[place].  He had been working outside in the heat, felt disoriented and felt dizziness.  His symptoms resolved.  He was admitted for CVA evaluation.  Neurology was consulted and suspected a left frontal acute infarct.  Dual antiplatelet therapy was recommended for 3 months.  A 30-day cardiac event monitor was ordered.***  He presents to the clinic today for follow-up evaluation and states***  *** denies chest pain, shortness of breath, lower extremity  edema, fatigue, palpitations, melena, hematuria, hemoptysis, diaphoresis, weakness, presyncope, syncope, orthopnea, and PND.  Syncope-30-day cardiac event monitor shows*** Maintain p.o. hydration, change positions slowly Lower extremity support stockings  Coronary artery disease-denies recent episodes of chest discomfort.  Status post CABG x 411/17 Continue aspirin, clopidogrel, rosuvastatin Heart healthy low-sodium diet-salty 6 given Increase physical activity as tolerated  Hyperlipidemia-LDL***. Continue aspirin, clopidogrel, rosuvastatin Heart healthy low-sodium high-fiber diet Increase physical activity as tolerated  Essential hypertension-BP today***. Continue blood pressure log Heart healthy low-sodium diet-salty 6 given Increase physical activity as tolerated  Peripheral vascular disease-denies claudication.  Remains physically active. Continue aspirin, clopidogrel, rosuvastatin Heart healthy low-sodium diet-salty 6 given Increase physical activity as tolerated Follows with Dr.Berry  CVA-noted to have occluded cerebral artery.  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  Seen by neurology who recommended dual antiplatelet therapy for 3 months with Plavix and aspirin. Follows with neurology  Disposition: Follow-up with Dr. Stanford Breed in 3-4 months.  Home Medications    Prior to Admission medications   Medication Sig Start Date End Date Taking? Authorizing Provider  aspirin EC 81 MG tablet Take 1 tablet (81 mg total) by mouth daily. Take daily with plavix for 3 months, then ask cardiology how they recommend you take the aspirin and plavix 10/07/21 02/04/22  Elodia Florence., MD  clopidogrel (PLAVIX) 75 MG tablet Take 1 tablet (75 mg total) by mouth daily.  Take daily with aspirin for 3 months.  Then discuss with cardiology to see what they'd like you to do with your aspirin and plavix. 10/07/21 02/04/22  Elodia Florence., MD   rosuvastatin (CRESTOR) 20 MG tablet Take 1 tablet (20 mg total) by mouth daily. 10/08/21 10/03/22  Elodia Florence., MD    Family History    Family History  Problem Relation Age of Onset   Dementia Mother    Liver disease Father    Diabetes Sister    Cirrhosis Brother    He indicated that his mother is deceased. He indicated that his father is deceased. He indicated that his sister is deceased. He indicated that his brother is deceased. He indicated that his maternal grandmother is deceased. He indicated that his maternal grandfather is deceased. He indicated that his paternal grandmother is deceased. He indicated that his paternal grandfather is deceased.  Social History    Social History   Socioeconomic History   Marital status: Married    Spouse name: Not on file   Number of children: 2   Years of education: Not on file   Highest education level: Not on file  Occupational History   Not on file  Tobacco Use   Smoking status: Former    Types: Cigarettes    Quit date: 04/11/1968    Years since quitting: 53.6   Smokeless tobacco: Never   Tobacco comments:    quit in the 1970s  Vaping Use   Vaping Use: Never used  Substance and Sexual Activity   Alcohol use: Yes    Comment: Rare   Drug use: No   Sexual activity: Not on file  Other Topics Concern   Not on file  Social History Narrative   Right handed   One story home   Drinks no caffeine   Social Determinants of Health   Financial Resource Strain: Not on file  Food Insecurity: Not on file  Transportation Needs: Not on file  Physical Activity: Not on file  Stress: Not on file  Social Connections: Not on file  Intimate Partner Violence: Not on file     Review of Systems    General:  No chills, fever, night sweats or weight changes.  Cardiovascular:  No chest pain, dyspnea on exertion, edema, orthopnea, palpitations, paroxysmal nocturnal dyspnea. Dermatological: No rash, lesions/masses Respiratory: No  cough, dyspnea Urologic: No hematuria, dysuria Abdominal:   No nausea, vomiting, diarrhea, bright red blood per rectum, melena, or hematemesis Neurologic:  No visual changes, wkns, changes in mental status. All other systems reviewed and are otherwise negative except as noted above.  Physical Exam    VS:  There were no vitals taken for this visit. , BMI There is no height or weight on file to calculate BMI. GEN: Well nourished, well developed, in no acute distress. HEENT: normal. Neck: Supple, no JVD, carotid bruits, or masses. Cardiac: RRR, no murmurs, rubs, or gallops. No clubbing, cyanosis, edema.  Radials/DP/PT 2+ and equal bilaterally.  Respiratory:  Respirations regular and unlabored, clear to auscultation bilaterally. GI: Soft, nontender, nondistended, BS + x 4. MS: no deformity or atrophy. Skin: warm and dry, no rash. Neuro:  Strength and sensation are intact. Psych: Normal affect.  Accessory Clinical Findings    Recent Labs: 10/07/2021: ALT 16; BUN 9; Creatinine, Ser 0.90; Hemoglobin 14.1; Platelets 290; Potassium 3.7; Sodium 138   Recent Lipid Panel    Component Value Date/Time   CHOL 200 10/06/2021 1421  CHOL 144 11/15/2016 0929   TRIG 107 10/06/2021 1421   HDL 45 10/06/2021 1421   HDL 46 11/15/2016 0929   CHOLHDL 4.4 10/06/2021 1421   VLDL 21 10/06/2021 1421   LDLCALC 134 (H) 10/06/2021 1421   LDLCALC 84 11/15/2016 0929    ECG personally reviewed by me today- *** - No acute changes  Echocardiogram 10/07/2021  IMPRESSIONS     1. Left ventricular ejection fraction, by estimation, is 50 to 55%. The  left ventricle has low normal function. The left ventricle demonstrates  regional wall motion abnormalities (see scoring diagram/findings for  description). Left ventricular diastolic   parameters are consistent with Grade I diastolic dysfunction (impaired  relaxation). There is hypokinesis of the left ventricular, apical inferior  wall.   2. Right ventricular  systolic function is normal. The right ventricular  size is normal. There is normal pulmonary artery systolic pressure. The  estimated right ventricular systolic pressure is 08.6 mmHg.   3. The mitral valve is normal in structure. Trivial mitral valve  regurgitation. No evidence of mitral stenosis.   4. The aortic valve is tricuspid. Aortic valve regurgitation is not  visualized. Aortic valve sclerosis/calcification is present, without any  evidence of aortic stenosis. Aortic valve area, by VTI measures 1.98 cm.  Aortic valve mean gradient measures  4.0 mmHg. Aortic valve Vmax measures 1.21 m/s.   5. The inferior vena cava is normal in size with greater than 50%  respiratory variability, suggesting right atrial pressure of 3 mmHg.   6. Recommend repeat limited study with definity contrast to define wall  motion   Assessment & Plan   1.  ***   Jossie Ng. Mattelyn Imhoff NP-C     11/17/2021, 6:58 AM [address] Office [phone] Fax [phone]  Notice: This dictation was prepared with Dragon dictation along with smaller phrase technology. Any transcriptional errors that result from this process are unintentional and may not be corrected upon review.  I spent***minutes examining this patient, reviewing medications, and using patient centered shared decision making involving her cardiac care.  Prior to her visit I spent greater than 20 minutes reviewing her past medical history,  medications, and prior cardiac tests.

## 2021-11-18 ENCOUNTER — Ambulatory Visit: Payer: Medicare PPO | Admitting: General Practice

## 2021-11-18 ENCOUNTER — Encounter: Payer: Self-pay | Admitting: General Practice

## 2021-11-18 VITALS — BP 116/56 | HR 68 | Ht 65.0 in | Wt 169.0 lb

## 2021-11-18 DIAGNOSIS — I2510 Atherosclerotic heart disease of native coronary artery without angina pectoris: Secondary | ICD-10-CM | POA: Diagnosis not present

## 2021-11-18 DIAGNOSIS — R55 Syncope and collapse: Secondary | ICD-10-CM | POA: Diagnosis not present

## 2021-11-18 DIAGNOSIS — E785 Hyperlipidemia, unspecified: Secondary | ICD-10-CM | POA: Diagnosis not present

## 2021-11-18 DIAGNOSIS — I10 Essential (primary) hypertension: Secondary | ICD-10-CM

## 2021-11-18 DIAGNOSIS — I739 Peripheral vascular disease, unspecified: Secondary | ICD-10-CM

## 2021-11-18 DIAGNOSIS — I639 Cerebral infarction, unspecified: Secondary | ICD-10-CM

## 2021-11-18 NOTE — Patient Instructions (Signed)
Medication Instructions:  The current medical regimen is effective;  continue present plan and medications as directed. Please refer to the Current Medication list given to you today.   *If you need a refill on your cardiac medications before your next appointment, please call your pharmacy*  Lab Work:   Testing/Procedures:  NONE    NONE  If you have labs (blood work) drawn today and your tests are completely normal, you will receive your results only by: [place] (if you have MyChart) OR  A paper copy in the mail If you have any lab test that is abnormal or we need to change your treatment, we will call you to review the results.  You may also go to any of these [facility] locations:  [id number],  [place] [address] ([facility])  [address]  [place] [address] Onc[facility])  Special Instructions CONTINUE PHYSICAL THERAPY  INCREASE YOUR IRON-SEE ATTACHED  Follow-Up: Your next appointment:  3-4 month(s) In Person with Kirk Ruths, MD     At [facility], you and your health needs are our priority.  As part of our continuing mission to provide you with exceptional heart care, we have created designated Provider Care Teams.  These Care Teams include your primary Cardiologist (physician) and Advanced Practice Providers (APPs -  Physician Assistants and Nurse Practitioners) who all work together to provide you with the care you need, when you need it.  Important Information About Sugar     Iron-Rich Diet  Iron is a mineral that helps your body produce hemoglobin. Hemoglobin is a protein in red blood cells that carries oxygen to your body's tissues. Eating too little iron may cause you to feel  weak and tired, and it can increase your risk of infection. Iron is naturally found in many foods, and many foods have iron added to them (are iron-fortified). You may need to follow an iron-rich diet if you do not have enough iron in your body due to certain medical conditions. The amount of iron that you need each day depends on your age, your sex, and any medical conditions you have. Follow instructions from your health care provider or a dietitian about how much iron you should eat each day. What are tips for following this plan? Reading food labels Check food labels to see how many milligrams (mg) of iron are in each serving. Cooking Cook foods in pots and pans that are made from iron. Take these steps to make it easier for your body to absorb iron from certain foods: Soak beans overnight before cooking. Soak whole grains overnight and drain them before using. Ferment flours before baking, such as by using yeast in bread dough. Meal planning When you eat foods that contain iron, you should eat them with foods that are high in vitamin C. These include oranges, peppers, tomatoes, potatoes, and mangoes. Vitamin C helps your body absorb iron. Certain foods and drinks prevent your body from absorbing iron properly. Avoid eating these foods in the same meal as iron-rich foods or with iron supplements. These foods include: Coffee, black tea, and red wine. Milk, dairy products, and foods that are  high in calcium. Beans and soybeans. Whole grains. General information Take iron supplements only as told by your health care provider. An overdose of iron can be life-threatening. If you were prescribed iron supplements, take them with orange juice or a vitamin C supplement. When you eat iron-fortified foods or take an iron supplement, you should also eat foods that naturally contain iron, such as meat, poultry, and fish. Eating naturally iron-rich foods helps your body absorb the iron that is added to  other foods or contained in a supplement. Iron from animal sources is better absorbed than iron from plant sources. What foods should I eat? Fruits Prunes. Raisins. Eat fruits high in vitamin C, such as oranges, grapefruits, and strawberries, with iron-rich foods. Vegetables Spinach (cooked). Green peas. Broccoli. Fermented vegetables. Eat vegetables high in vitamin C, such as leafy greens, potatoes, bell peppers, and tomatoes, with iron-rich foods. Grains Iron-fortified breakfast cereal. Iron-fortified whole-wheat bread. Enriched rice. Sprouted grains. Meats and other proteins Beef liver. Beef. [place]. Chicken. Oysters. Shrimp. [place]. Sardines. Chickpeas. Nuts. Tofu. Pumpkin seeds. Beverages Tomato juice. Fresh orange juice. Prune juice. Hibiscus tea. Iron-fortified instant breakfast shakes. Sweets and desserts Blackstrap molasses. Seasonings and condiments Tahini. Fermented soy sauce. Other foods Wheat germ. The items listed above may not be a complete list of recommended foods and beverages. Contact a dietitian for more information. What foods should I limit? These are foods that should be limited while eating iron-rich foods as they can reduce the absorption of iron in your body. Grains Whole grains. Bran cereal. Bran flour. Meats and other proteins Soybeans. Products made from soy protein. Black beans. Lentils. Mung beans. Split peas. Dairy Milk. Cream. Cheese. Yogurt. Cottage cheese. Beverages Coffee. Black tea. Red wine. Sweets and desserts Cocoa. Chocolate. Ice cream. Seasonings and condiments Basil. Oregano. Large amounts of parsley. The items listed above may not be a complete list of foods and beverages you should limit. Contact a dietitian for more information. Summary Iron is a mineral that helps your body produce hemoglobin. Hemoglobin is a protein in red blood cells that carries oxygen to your body's tissues. Iron is naturally found in many foods, and many foods  have iron added to them (are iron-fortified). When you eat foods that contain iron, you should eat them with foods that are high in vitamin C. Vitamin C helps your body absorb iron. Certain foods and drinks prevent your body from absorbing iron properly, such as whole grains and dairy products. You should avoid eating these foods in the same meal as iron-rich foods or with iron supplements. This information is not intended to replace advice given to you by your health care provider. Make sure you discuss any questions you have with your health care provider. Document Revised: 03/09/2020 Document Reviewed: 03/09/2020 Elsevier Patient Education  [place].

## 2021-11-22 ENCOUNTER — Encounter: Payer: Self-pay | Admitting: Physical Therapy

## 2021-11-22 ENCOUNTER — Ambulatory Visit: Payer: Medicare PPO | Admitting: Physical Therapy

## 2021-11-22 ENCOUNTER — Encounter: Payer: Self-pay | Admitting: Family Medicine

## 2021-11-22 ENCOUNTER — Telehealth: Payer: Self-pay | Admitting: Family Medicine

## 2021-11-22 ENCOUNTER — Ambulatory Visit: Payer: Medicare PPO | Admitting: Family Medicine

## 2021-11-22 VITALS — BP 116/67

## 2021-11-22 VITALS — BP 98/64 | HR 67 | Temp 97.8°F | Ht 65.0 in | Wt 168.9 lb

## 2021-11-22 DIAGNOSIS — R2681 Unsteadiness on feet: Secondary | ICD-10-CM

## 2021-11-22 DIAGNOSIS — M792 Neuralgia and neuritis, unspecified: Secondary | ICD-10-CM | POA: Diagnosis not present

## 2021-11-22 DIAGNOSIS — R7303 Prediabetes: Secondary | ICD-10-CM

## 2021-11-22 DIAGNOSIS — M19049 Primary osteoarthritis, unspecified hand: Secondary | ICD-10-CM

## 2021-11-22 DIAGNOSIS — M6281 Muscle weakness (generalized): Secondary | ICD-10-CM

## 2021-11-22 DIAGNOSIS — I2510 Atherosclerotic heart disease of native coronary artery without angina pectoris: Secondary | ICD-10-CM

## 2021-11-22 DIAGNOSIS — R278 Other lack of coordination: Secondary | ICD-10-CM | POA: Diagnosis not present

## 2021-11-22 DIAGNOSIS — I6350 Cerebral infarction due to unspecified occlusion or stenosis of unspecified cerebral artery: Secondary | ICD-10-CM

## 2021-11-22 LAB — URIC ACID: Uric Acid, Serum: 5.6 mg/dL (ref 4.0–7.8)

## 2021-11-22 LAB — VITAMIN B12: Vitamin B-12: >1500 pg/mL — ABNORMAL HIGH (ref 211–911)

## 2021-11-22 NOTE — Therapy (Signed)
OUTPATIENT PHYSICAL THERAPY NEURO TREATMENT-DISCHARGE SUMMARY   Patient Name: [name] [name] MRN: [id number] DOB:1945/08/20, 76 y.o., male Today's Date: 11/22/2021   PCP: [name], MD REFERRING PROVIDER: Elodia Florence., MD   PHYSICAL THERAPY DISCHARGE SUMMARY  Visits from Start of Care: 5  Current functional level related to goals / functional outcomes:  Mod I with gait at community level with no AD, gait speed of 3.22 ft/sec with use of L foot-up brace.  Independent with HEP for LE strengthening and balance.  10/54 on HiMat.   Remaining deficits: Decreased LLE strength and control, continue use of foot-up brace and continue with HEP   Education / Equipment: Foot-up brace HEP   Patient agrees to discharge. Patient goals were partially met (pt has met 3/4 goals). Patient is being discharged due to  meeting stated rehab goals and being please with current functional level.    PT End of Session - 11/22/21 1147     Visit Number 5    Number of Visits 5    Date for PT Re-Evaluation 11/30/21    Authorization Type [employer]    Progress Note Due on Visit 10    PT Start Time 0254    PT Stop Time 1214   discharge   PT Time Calculation (min) 29 min    Equipment Utilized During Treatment Other (comment)   L foot up brace   Activity Tolerance Patient tolerated treatment well    Behavior During Therapy WFL for tasks assessed/performed                Past Medical History:  Diagnosis Date   Constipation    Diabetes mellitus without complication ([place])    borderline/ pre diabetic   NSTEMI (non-ST elevated myocardial infarction) ([place])    at [place]   Stroke ([place])    L side - 40 years ago   Past Surgical History:  Procedure Laterality Date   CARDIAC CATHETERIZATION     CATARACT EXTRACTION Bilateral    05/2021; 07/2021   CORONARY ARTERY BYPASS GRAFT     at [facility]   Patient Active Problem List   Diagnosis Date Noted   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    Neuropathic pain of hand, right 10/21/2021   Syncope 10/07/2021   Prediabetes 10/07/2021   Stroke ([place]) 10/07/2021   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 ([place])    Acute ischemic stroke ([place]) 10/06/2021   CAD (coronary artery disease) 10/06/2021   PVD (peripheral vascular disease) ([place]) 04/15/2019   Dyslipidemia, goal LDL below 70 04/15/2019   History of CVA with residual deficit 04/15/2019   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 Pt reports he has been feeling the spasms after working the muscle and doing his exercises, he and his wife believe it is related to the nerves and muscles "waking up" since he is able to use them more than before he started therapy.  Pt accompanied by: significant other(wife, [name])  PERTINENT HISTORY: CAD with CABG in 2017, history of CVA in the 1980s, peripheral arterial disease, and prediabetes   PAIN:  Are you having pain? No  VITALS:  Today's Vitals   11/22/21 1152  BP: 116/67     PRECAUTIONS: Fall   TODAY'S TREATMENT:  GAIT: Gait pattern: decreased step length- Left, decreased hip/knee flexion- Left, decreased ankle dorsiflexion- Left, and circumduction- Left Distance walked: 115 ft Assistive device utilized:  L foot-up brace Level of assistance: Modified independence Comments: Pt exhibits improved LLE clearance with use of foot-up brace.  Pt also exhibits occasional L knee genu recurvatum.  THER EX: Added to HEP, see bolded below   THER ACT:  [facility] PT Assessment - 11/22/21 1156       Ambulation/Gait   Gait velocity 32.8 ft over 10.2 sec = 3.22 ft/sec             Re-assessed HiMat:   PATIENT EDUCATION:  Education details: HEP, use of foot-up brace, d/c from PT Person educated: Patient and Spouse Education method: Explanation, Demonstration, and Handouts Education comprehension: verbalized understanding and needs further education   HOME EXERCISE PROGRAM: Access Code: [email or web address] URL: [email or web address] Date: 11/01/2021 Prepared by: Excell Seltzer  Exercises - Forward Step Up with Counter Support  - 1 x daily - 7 x weekly - 3 sets - 10 reps - Lateral Step Up with Counter Support  - 1 x daily - 7 x weekly - 3 sets - 10 reps - Side Stepping with Counter Support With Red Theraband - 1 x daily - 7 x weekly - 3 sets - 10 reps - Seated Hamstring Curl with Anchored Resistance (Blue Theraband for RLE, Red Theraband for LLE) - 1 x daily - 7 x weekly - 3 sets - 10 reps - Standing Terminal Knee Extension with Resistance  - 1 x daily - 7 x weekly - 3 sets - 10 reps - Forward Monster Walk with Resistance at Ankles and Counter Support  - 1 x daily - 7 x weekly - 3 sets - 10 reps   GOALS: Goals reviewed with patient? Yes STG = LTG DUE TO POC LENGTH  LONG TERM GOALS: Target date: 11/23/2021  Pt will be independent with final HEP for improved strength, balance, transfers and gait. Baseline:  Goal status: MET  2.  Pt will improve gait velocity to at least 3.2 ft/s for improved gait efficiency  Baseline: 2.88 ft/s, 3.22 ft/sec (8/14)  Goal status: MET  3.  Pt will improve HiMat score to 15/54 to demonstrate improved higher-level balance. Baseline: 9/54 (11/01/21), 10/54 (11/22/21) Goal status: NOT MET  4.  Pt will verbalize plan for fitness post DC for continued functional mobility and strength gains.   Baseline:  Goal status: MET  ASSESSMENT:  CLINICAL IMPRESSION: Emphasis of skilled PT session on reviewing goals with patient, setting pt up with foot-up brace that is wife purchased, and adding to/reviewing HEP. Pt has met 3/4 LTG with improved gait speed (3.22 ft/sec as compared to 2.88 ft/sec on eval) and ability to perform HEP for LE strengthening independently. Pt improved score on HiMat from 9/54 (  on 7/24) to 10/54 but did not meet goal of 15/54. However, pt exhibits improved gait speed, balance, and safety with gait and does not regularly engage in activities such as running, skipping, etc that HiMat assesses. Pt has met goals adequately for a safe d/c from PT services at this time.    PLAN: PT FREQUENCY: 1x/week  PT DURATION: 4 weeks  PLANNED INTERVENTIONS: Therapeutic exercises, Therapeutic activity, Neuromuscular re-education, Balance training, Gait training, Patient/Family education, Self Care, and Joint mobilization       Excell Seltzer, PT, DPT, CSRS  11/22/2021, 12:17 PM

## 2021-11-22 NOTE — Assessment & Plan Note (Signed)
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 I will order the B12 level and we discussed further testing such as an EMG to confirm if there is a nerve compression vs. Residual symptoms from his strokes.

## 2021-11-22 NOTE — Telephone Encounter (Signed)
His cholesterol was checked in the hospital just a couple months ago so it does not need to be rechecked at this time.

## 2021-11-22 NOTE — Assessment & Plan Note (Signed)
Patient denies any new neurologic symptoms at this time. He continues on antiplatelet and statin therapy as preventative measures. His blood pressure is normal and he is not on any medication for this. He continues to go to PT/OT as an outpatient. We discussed that the neurologic symptoms he is experiencing on the right side may be residual from his stroke, possibly even from prior strokes he has sustained in the past. See further assessment and plan below.

## 2021-11-22 NOTE — Telephone Encounter (Signed)
Pt wife call and stated pt just had lab work and want to know if she will check his cholesterol check to.

## 2021-11-22 NOTE — Progress Notes (Signed)
New Patient Office Visit  Subjective    Patient ID: [name] [name], male    DOB: 1945/04/29  Age: 76 y.o. MRN: [id number]  CC:  Chief Complaint  Patient presents with   Establish Care    Patient states the records stating he is diabetic is incorrect    HPI [name] [name] presents to establish care Patient reports that since his stroke he is having problems with his memory. He is reporting "electric shocks" in his right arm that he has been experiencing since before his stroke. He is also having double vision and feeling of being off balance. States that the "electric shocks" have been going on for about a year. 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 States that he thinks he gets dizzy because of the electrical shocks.Only happens in the right arm, and and he is concerned that it is related to the electrical device is what is causing the sensations.    I reviewed his notes from Dr. Stann Mainland ad Dr. Tomi Likens, pt reports he completed the cardiac monitor and is waiting on the results. He states he is compliant with his medications and checks his blood pressure daily. I have reviewed his imaging, documentation from the hospital stay in June 2023. I have also reviewed his labs from the hospital stay as well.    Outpatient Encounter Medications as of 11/22/2021  Medication Sig   aspirin EC 81 MG tablet Take 1 tablet (81 mg total) by mouth daily. Take daily with plavix for 3 months, then ask cardiology how they recommend you take the aspirin and plavix   clopidogrel (PLAVIX) 75 MG tablet Take 1 tablet (75 mg total) by mouth daily. Take daily with aspirin for 3 months.  Then discuss with cardiology to see what they'd like you to do with your aspirin and plavix.   rosuvastatin (CRESTOR) 20 MG tablet Take 1 tablet (20 mg total) by mouth daily. (Patient taking differently: Take 20 mg by mouth daily. Take 0.5 tablet by mouth  daily.)   No facility-administered encounter medications on file as of 11/22/2021.    Past Medical History:  Diagnosis Date   Constipation    Diabetes mellitus without complication (H[facility])    borderline/ pre diabetic   NSTEMI (non-ST elevated myocardial infarction) ([place])    at [place]   Stroke ([place])    L side - 40 years ago    Past Surgical History:  Procedure Laterality Date   CARDIAC CATHETERIZATION     CATARACT EXTRACTION Bilateral    05/2021; 07/2021   CORONARY ARTERY BYPASS GRAFT     at [facility] History  Problem Relation Age of Onset   Dementia Mother    Liver disease Father    Diabetes Sister    Cirrhosis Brother     Social History   Socioeconomic History   Marital status: Married    Spouse name: Not on file   Number of children: 2   Years of education: Not on file   Highest education level: Not on file  Occupational History   Not on file  Tobacco Use   Smoking status: Former    Types: Cigarettes    Quit date: 04/11/1968    Years since quitting: 53.6   Smokeless tobacco: Never   Tobacco comments:    quit in the 1970s  Vaping Use   Vaping Use: Never used  Substance and Sexual  Activity   Alcohol use: Yes    Comment: Rare   Drug use: No   Sexual activity: Not on file  Other Topics Concern   Not on file  Social History Narrative   Right handed   One story home   Drinks no caffeine   Social Determinants of Health   Financial Resource Strain: Not on file  Food Insecurity: Not on file  Transportation Needs: Not on file  Physical Activity: Not on file  Stress: Not on file  Social Connections: Not on file  Intimate Partner Violence: Not on file    Review of Systems  Constitutional:  Negative for malaise/fatigue.  Respiratory:  Negative for cough.   Cardiovascular:  Negative for chest pain.  Musculoskeletal:  Positive for joint pain. Negative for falls.  Neurological:  Positive for tingling and tremors (pt states he gets a "shaking" in  his left leg when he is using the leg too much). Negative for dizziness, sensory change, focal weakness, seizures, weakness and headaches.  Psychiatric/Behavioral:  Positive for memory loss. The patient does not have insomnia.   All other systems reviewed and are negative.       Objective    BP 98/64 (BP Location: Left Arm, Patient Position: Sitting, Cuff Size: Normal)   Pulse 67   Temp 97.8 F (36.6 C) (Oral)   Ht '5\' 5"'$  (1.651 m)   Wt 168 lb 14.4 oz (76.6 kg)   SpO2 98%   BMI 28.11 kg/m   Physical Exam Vitals reviewed.  Constitutional:      Appearance: Normal appearance. He is well-groomed and normal weight.  HENT:     Head: Normocephalic and atraumatic.  Eyes:     Extraocular Movements: Extraocular movements intact.     Conjunctiva/sclera: Conjunctivae normal.     Pupils: Pupils are equal, round, and reactive to light.  Cardiovascular:     Rate and Rhythm: Normal rate and regular rhythm.     Pulses: Normal pulses.     Heart sounds: S1 normal and S2 normal. No murmur heard. Pulmonary:     Effort: Pulmonary effort is normal.     Breath sounds: Normal breath sounds and air entry. No rales.  Abdominal:     General: Abdomen is flat. Bowel sounds are normal.     Palpations: Abdomen is soft.     Tenderness: There is no abdominal tenderness.  Musculoskeletal:     Right lower leg: No edema.     Left lower leg: No edema.  Neurological:     General: No focal deficit present.     Mental Status: He is alert and oriented to person, place, and time.     Cranial Nerves: No cranial nerve deficit.     Motor: No tremor.     Gait: Gait is intact.  Psychiatric:        Mood and Affect: Mood and affect normal.   Lab Results  Component Value Date   HGBA1C 6.4 (H) 10/06/2021        Assessment & Plan:   Problem List Items Addressed This Visit       Cardiovascular and Mediastinum   CAD (coronary artery disease) - Primary   Cerebrovascular accident (CVA) due to occlusion of  cerebral artery ([place])    Patient denies any new neurologic symptoms at this time. He continues on antiplatelet and statin therapy as preventative measures. His blood pressure is normal and he is not on any medication for this. He continues to go to  PT/OT as an outpatient. We discussed that the neurologic symptoms he is experiencing on the right side may be residual from his stroke, possibly even from prior strokes he has sustained in the past. See further assessment and plan below.        Musculoskeletal and Integument   Arthritis of hand (Chronic)    Patient has ulnar deviation of the hands, more pronounced on the right than left. I will order rheumatoid, ANA, and uric acid to rule out inflammatory arthritis.       Relevant Orders   Rheumatoid Factor   Uric Acid   ANA     Other   Prediabetes    Last A1C was 6.4, he is not a full diabetic at this point. He continues to monitor his diet to manage this problem. I will continue to monitor his A1C every 6 months.      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 I will order the B12 level and we discussed further testing such as an EMG to confirm if there is a nerve compression vs. Residual symptoms from his strokes.       Relevant Orders   Vitamin B12    Return in about 6 months (around 05/25/2022) for follow up.   Farrel Conners, MD

## 2021-11-22 NOTE — Assessment & Plan Note (Signed)
Last A1C was 6.4, he is not a full diabetic at this point. He continues to monitor his diet to manage this problem. I will continue to monitor his A1C every 6 months.

## 2021-11-22 NOTE — Telephone Encounter (Signed)
Patient informed of the message below.

## 2021-11-22 NOTE — Assessment & Plan Note (Signed)
Patient has ulnar deviation of the hands, more pronounced on the right than left. I will order rheumatoid, ANA, and uric acid to rule out inflammatory arthritis.

## 2021-11-24 ENCOUNTER — Ambulatory Visit: Payer: Medicare PPO | Admitting: Occupational Therapy

## 2021-11-24 DIAGNOSIS — R41840 Attention and concentration deficit: Secondary | ICD-10-CM

## 2021-11-24 DIAGNOSIS — R278 Other lack of coordination: Secondary | ICD-10-CM | POA: Diagnosis not present

## 2021-11-24 DIAGNOSIS — R41842 Visuospatial deficit: Secondary | ICD-10-CM

## 2021-11-24 DIAGNOSIS — R2681 Unsteadiness on feet: Secondary | ICD-10-CM

## 2021-11-24 DIAGNOSIS — M6281 Muscle weakness (generalized): Secondary | ICD-10-CM

## 2021-11-24 LAB — RHEUMATOID FACTOR: Rheumatoid fact SerPl-aCnc: 23 IU/mL — ABNORMAL HIGH (ref <14)

## 2021-11-24 LAB — ANA: Anti Nuclear Antibody (ANA): NEGATIVE

## 2021-11-24 NOTE — Therapy (Signed)
OUTPATIENT OCCUPATIONAL THERAPY NEURO TREATMENT  Patient Name: [name] [name] MRN: [id number] DOB:1946-02-05, 76 y.o., male Today's Date: 11/24/2021  PCP: [name] [name] REFERRING PROVIDER:  Elodia [name]   OT End of Session - 11/24/21 0935     Visit Number 3    Number of Visits 9    Date for OT Re-Evaluation 12/27/21    Authorization Type [employer] MCR - form submitted    Progress Note Due on Visit 10    OT Start Time 0935    OT Stop Time 1015    OT Time Calculation (min) 40 min    Activity Tolerance Patient tolerated treatment well    Behavior During Therapy WFL for tasks assessed/performed              Past Medical History:  Diagnosis Date   Constipation    Diabetes mellitus without complication ([place])    borderline/ pre diabetic   NSTEMI (non-ST elevated myocardial infarction) ([place])    at [place]   Stroke ([place])    L side - 40 years ago   Past Surgical History:  Procedure Laterality Date   CARDIAC CATHETERIZATION     CATARACT EXTRACTION Bilateral    05/2021; 07/2021   CORONARY ARTERY BYPASS GRAFT     at [facility]   Patient Active Problem List   Diagnosis Date Noted   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   Neuropathic pain of hand, right 10/21/2021   Syncope 10/07/2021   Prediabetes 10/07/2021   Stroke ([place]) 10/07/2021   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 ([place])    Acute ischemic stroke ([place]) 10/06/2021   CAD (coronary artery disease) 10/06/2021   PVD (peripheral vascular disease) ([place]) 04/15/2019   Dyslipidemia, goal LDL below 70 04/15/2019   History of CVA with residual deficit 04/15/2019   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 I feel like the last 6 months to a year, I've been stumbling more but I've always caught myself Pt accompanied by: significant other  PERTINENT HISTORY: medical history significant for CAD with CABG in 2017, history of CVA in the 1980s, peripheral arterial disease, and prediabetes who presents emergency department after syncopal episode yesterday.   IMPRESSION: 1. 4.7 cm acute to early subacute left MCA distribution infarct. No associated hemorrhage or significant mass effect. 2. No other acute intracranial abnormality. 3. 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 4. Underlying age-related cerebral atrophy with mild chronic small vessel ischemic disease.  PRECAUTIONS: Other: heart monitor, no driving    TODAY'S TREATMENT:  Reviewed coordination and cane HEP issued last visit min v.c for performance Handwriting activity with good legibility and letter size, pt wrote 3 sentences and his name legibly along with the alphabet. Functional reach with sustained pinch to place and remove graded clothespins 1-8# from antennae in standing  PATIENT EDUCATION: Education details: coordination and cane HEP review Person educated: Patient and Spouse Education method: Explanation, Demonstration, Verbal cues,  Education comprehension: verbalized understanding, returned demonstration, and verbal cues required    HOME EXERCISE PROGRAM: 11/15/21: Coordination and cane HEP     GOALS: Goals  reviewed with patient? Yes   LONG TERM GOALS: Target date: 12/27/21  Independent w/ HEP for BUE endurance and bilateral coordination Baseline:  Goal status: MET  2.  Pt to attend to Rt hand for functional tasks, especially to release items when appropriate Baseline:  Goal status: INITIAL  3.  Pt to perform environmental scanning w/ 85% accuracy or greater Baseline:  Goal  status: INITIAL  4.  Pt to return to performing light IADLS (laundry/washing dishes) Baseline:  Goal status: INITIAL  5.  Pt to demo picking up items off floor w/o LOB in prep for light yardwork Baseline:  Goal status: INITIAL   ASSESSMENT:  CLINICAL IMPRESSION: Pt is progressing towards goals. He demonstrates understanding of initial HEP.  PERFORMANCE DEFICITS in functional skills including ADLs, IADLs, coordination, dexterity, strength, FMC, GMC, mobility, body mechanics, endurance, decreased knowledge of precautions, and UE functional use, cognitive skills including attention and safety awareness,   IMPAIRMENTS are limiting patient from IADLs and leisure.   COMORBIDITIES may have co-morbidities  that affects occupational performance. Patient will benefit from skilled OT to address above impairments and improve overall function.  MODIFICATION OR ASSISTANCE TO COMPLETE EVALUATION: No modification of tasks or assist necessary to complete an evaluation.  OT OCCUPATIONAL PROFILE AND HISTORY: Problem focused assessment: Including review of records relating to presenting problem.  CLINICAL DECISION MAKING: Moderate - several treatment options, min-mod task modification necessary  REHAB POTENTIAL: Good  EVALUATION COMPLEXITY: Low    PLAN: OT FREQUENCY: 2x/week  OT DURATION: 4 weeks (PLUS EVAL)   PLANNED INTERVENTIONS: self care/ADL training, therapeutic exercise, therapeutic activity, neuromuscular re-education, manual therapy, passive range of motion, functional mobility training, moist heat, patient/family education, cognitive remediation/compensation, visual/perceptual remediation/compensation, coping strategies training, DME and/or AE instructions, and Re-evaluation  RECOMMENDED OTHER SERVICES: NONE  CONSULTED AND AGREED WITH PLAN OF CARE: Patient and family member/caregiver  PLAN FOR NEXT SESSION: progress towards short term goals   [name] [name], OT 11/24/2021, 10:02  AM

## 2021-11-24 NOTE — Progress Notes (Signed)
His rheumatoid factor is positive however his ANA is negative. We could potentially refer him to rheumatology for evaluation if he is having significant joint pain.Marland KitchenMarland Kitchen

## 2021-11-29 ENCOUNTER — Ambulatory Visit: Payer: Medicare PPO | Admitting: Occupational Therapy

## 2021-11-29 DIAGNOSIS — R41842 Visuospatial deficit: Secondary | ICD-10-CM

## 2021-11-29 DIAGNOSIS — R278 Other lack of coordination: Secondary | ICD-10-CM | POA: Diagnosis not present

## 2021-11-29 DIAGNOSIS — R2681 Unsteadiness on feet: Secondary | ICD-10-CM

## 2021-11-29 DIAGNOSIS — M6281 Muscle weakness (generalized): Secondary | ICD-10-CM

## 2021-11-29 DIAGNOSIS — R41840 Attention and concentration deficit: Secondary | ICD-10-CM

## 2021-11-29 NOTE — Therapy (Signed)
OUTPATIENT OCCUPATIONAL THERAPY NEURO TREATMENT  Patient Name: [name] [name] MRN: [id number] DOB:1945/05/06, 76 y.o., male Today's Date: 11/29/2021  PCP: Iona Coach REFERRING PROVIDER:  Elodia Florence   OT End of Session - 11/29/21 0849     Visit Number 4    Number of Visits 9    Date for OT Re-Evaluation 12/27/21    Authorization Type [employer] MCR - form submitted    Progress Note Due on Visit 10    OT Start Time 0845    OT Stop Time 0930    OT Time Calculation (min) 45 min    Activity Tolerance Patient tolerated treatment well    Behavior During Therapy WFL for tasks assessed/performed              Past Medical History:  Diagnosis Date   Constipation    Diabetes mellitus without complication ([place])    borderline/ pre diabetic   NSTEMI (non-ST elevated myocardial infarction) ([place])    at [place]   Stroke ([place])    L side - 40 years ago   Past Surgical History:  Procedure Laterality Date   CARDIAC CATHETERIZATION     CATARACT EXTRACTION Bilateral    05/2021; 07/2021   CORONARY ARTERY BYPASS GRAFT     at [facility]   Patient Active Problem List   Diagnosis Date Noted   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   Neuropathic pain of hand, right 10/21/2021   Syncope 10/07/2021   Prediabetes 10/07/2021   Stroke ([place]) 10/07/2021   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 ([place])    Acute ischemic stroke ([place]) 10/06/2021   CAD (coronary artery disease) 10/06/2021   PVD (peripheral vascular disease) ([place]) 04/15/2019   Dyslipidemia, goal LDL below 70 04/15/2019   History of CVA with residual deficit 04/15/2019   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    ONSET DATE: 10/06/21  REFERRING DIAG: I63.50 (ICD-10-CM) - 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   IMPRESSION: 1. 4.7 cm acute to early subacute left MCA distribution infarct. No associated hemorrhage or significant mass effect. 2. No other acute intracranial abnormality. 3. 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 4. Underlying age-related cerebral atrophy with mild chronic small vessel ischemic disease.  PRECAUTIONS: Other: heart monitor, no driving    TODAY'S TREATMENT:  Pt carrying plate of "food" Rt hand w/o drops. Progressed to carrying plate of "food" Lt hand while carrying cup of water Rt hand w/ slight slant of plate but no drops/spills, however cues needed to negotiate ambulation safely around corners and tight spaces  Pt placing small pegs in pegboard w/ Rt hand while copying peg design w/ min difficulty using Rt hand and copying design w/ mod cues to correct design (only off by 1 line on diagonal of 1 triangle)  Environmental Scanning with 7/14 accuracy = 50% on first pass, missing 5 on Rt side, 2 on Lt side. Pt found 5/7 remaining items on 2nd pass, and required min cues  for locating remaining items on third pass.    HOME EXERCISE PROGRAM: 11/15/21: Coordination and cane HEP     GOALS: Goals reviewed with patient? Yes   LONG TERM GOALS: Target date: 12/27/21  Independent w/ HEP for BUE endurance and bilateral coordination Baseline:  Goal status: MET  2.  Pt to attend to Rt hand for functional tasks, especially to release items when appropriate Baseline:  Goal status: IN PROGRESS  3.  Pt to perform environmental scanning w/ 85% accuracy or greater Baseline:  Goal status:  INITIAL  4.  Pt to return to performing light IADLS (laundry/washing dishes) Baseline:  Goal status: INITIAL  5.  Pt to demo picking up items off floor w/o LOB in prep for light yardwork Baseline:  Goal status: INITIAL   ASSESSMENT:  CLINICAL IMPRESSION: Pt is progressing towards goals. Pt with decreased attention w/ hands and w/ environmental scanning  PERFORMANCE DEFICITS in functional skills including ADLs, IADLs, coordination, dexterity, strength, FMC, GMC, mobility, body mechanics, endurance, decreased knowledge of precautions, and UE functional use, cognitive skills including attention and safety awareness,   IMPAIRMENTS are limiting patient from IADLs and leisure.   COMORBIDITIES may have co-morbidities  that affects occupational performance. Patient will benefit from skilled OT to address above impairments and improve overall function.  MODIFICATION OR ASSISTANCE TO COMPLETE EVALUATION: No modification of tasks or assist necessary to complete an evaluation.  OT OCCUPATIONAL PROFILE AND HISTORY: Problem focused assessment: Including review of records relating to presenting problem.  CLINICAL DECISION MAKING: Moderate - several treatment options, min-mod task modification necessary  REHAB POTENTIAL: Good  EVALUATION COMPLEXITY: Low    PLAN: OT FREQUENCY: 2x/week  OT DURATION: 4 weeks (PLUS EVAL)   PLANNED INTERVENTIONS: self care/ADL training, therapeutic exercise, therapeutic activity, neuromuscular re-education, manual therapy, passive range of motion, functional mobility training, moist heat, patient/family education, cognitive remediation/compensation, visual/perceptual remediation/compensation, coping strategies training, DME and/or AE instructions, and Re-evaluation  RECOMMENDED OTHER SERVICES: NONE  CONSULTED AND AGREED WITH PLAN OF CARE: Patient and family member/caregiver  PLAN FOR NEXT SESSION: continue body awareness, environmental scanning   Hans Eden, OT 11/29/2021, 8:49 AM

## 2021-12-01 ENCOUNTER — Encounter: Payer: Medicare PPO | Admitting: Occupational Therapy

## 2021-12-06 ENCOUNTER — Ambulatory Visit: Payer: Medicare PPO | Admitting: Occupational Therapy

## 2021-12-08 ENCOUNTER — Ambulatory Visit: Payer: Medicare PPO | Admitting: Occupational Therapy

## 2021-12-14 ENCOUNTER — Telehealth: Payer: Self-pay | Admitting: Family Medicine

## 2021-12-14 NOTE — Telephone Encounter (Signed)
It's ok with me. 

## 2021-12-14 NOTE — Telephone Encounter (Signed)
Pt wife is calling and pt would like to transfer to dr Jerilee Hoh. Please advise

## 2021-12-16 ENCOUNTER — Ambulatory Visit: Payer: Medicare PPO | Attending: Family Medicine | Admitting: Occupational Therapy

## 2021-12-16 ENCOUNTER — Encounter: Payer: Self-pay | Admitting: Occupational Therapy

## 2021-12-16 DIAGNOSIS — R41842 Visuospatial deficit: Secondary | ICD-10-CM | POA: Diagnosis present

## 2021-12-16 DIAGNOSIS — R278 Other lack of coordination: Secondary | ICD-10-CM | POA: Insufficient documentation

## 2021-12-16 DIAGNOSIS — R2681 Unsteadiness on feet: Secondary | ICD-10-CM | POA: Diagnosis present

## 2021-12-16 DIAGNOSIS — M6281 Muscle weakness (generalized): Secondary | ICD-10-CM | POA: Insufficient documentation

## 2021-12-16 DIAGNOSIS — R41840 Attention and concentration deficit: Secondary | ICD-10-CM | POA: Insufficient documentation

## 2021-12-16 NOTE — Therapy (Signed)
OUTPATIENT OCCUPATIONAL THERAPY NEURO TREATMENT  Patient Name: [name] [name] MRN: [id number] DOB:1946/02/03, 76 y.o., male Today's Date: 12/16/2021  PCP: Iona Coach REFERRING PROVIDER:  Elodia Florence   OT End of Session - 12/16/21 [phone]     Visit Number 5    Number of Visits 9    Date for OT Re-Evaluation 12/27/21    Authorization Type [employer] MCR - form submitted    Progress Note Due on Visit 10    OT Start Time 0935    OT Stop Time 1015    OT Time Calculation (min) 40 min    Activity Tolerance Patient tolerated treatment well    Behavior During Therapy WFL for tasks assessed/performed              Past Medical History:  Diagnosis Date   Constipation    Diabetes mellitus without complication ([place])    borderline/ pre diabetic   NSTEMI (non-ST elevated myocardial infarction) ([place])    at [place]   Stroke ([place])    L side - 40 years ago   Past Surgical History:  Procedure Laterality Date   CARDIAC CATHETERIZATION     CATARACT EXTRACTION Bilateral    05/2021; 07/2021   CORONARY ARTERY BYPASS GRAFT     at [facility]   Patient Active Problem List   Diagnosis Date Noted   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   Neuropathic pain of hand, right 10/21/2021   Syncope 10/07/2021   Prediabetes 10/07/2021   Stroke ([place]) 10/07/2021   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 ([place])    Acute ischemic stroke ([place]) 10/06/2021   CAD (coronary artery disease) 10/06/2021   PVD (peripheral vascular disease) ([place]) 04/15/2019   Dyslipidemia, goal LDL below 70 04/15/2019   History of CVA with residual deficit 04/15/2019   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    ONSET DATE: 10/06/21  REFERRING DIAG: I63.50 (ICD-10-CM) - 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 No  PERTINENT HISTORY: medical history significant for CAD with CABG in 2017, history of CVA in the 1980s, peripheral arterial disease, and prediabetes who presents emergency department after syncopal episode yesterday.   IMPRESSION: 1. 4.7 cm acute to early subacute left MCA distribution infarct. No associated hemorrhage or significant mass effect. 2. No other acute intracranial abnormality. 3. 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 4. Underlying age-related cerebral atrophy with mild chronic small vessel ischemic disease.  PRECAUTIONS: Other: heart monitor, no driving    TODAY'S TREATMENT:  This was pt's last scheduled visit - began checking goals to determine if renewal needed or d/c appropriate.   Pt able to pick up items off floor w/o UE support and no LOB x 15 reps.  Pt folding towels/clothes and demo washing dishes in clinic w/o difficulty (although he reports not doing at home) - explained benefit of doing this for UE endurance and functional use of UE's.  Pt practiced carrying laundry basket full around clinic w/ min difficulty however no LOB.   Practiced carrying plate of "food" and cup of water (switching b/t hands) negotiating through tight spaces w/ cues to attend around corners and ambulating through tight spaces. Pt  did well w/ no spills as long as he was attending.   Recommended pt go with wife to grocery store and/or drug store and look for needed items for environmental scanning, to attend to hands when using them, and to be attentive when negotiating through tight doorways/spaces w/ ambulation. Also recommended NO driving and NO use of power tools d/t decreased awareness and attention, and mild cognitive deficits.    HOME EXERCISE PROGRAM: 11/15/21: Coordination and cane HEP     GOALS: Goals  reviewed with patient? Yes   LONG TERM GOALS: Target date: 12/27/21  Independent w/ HEP for BUE endurance and bilateral coordination Baseline:  Goal status: MET  2.  Pt to attend to Rt hand for functional tasks, especially to release items when appropriate Baseline:  Goal status: MET  3.  Pt to perform environmental scanning w/ 85% accuracy or greater Baseline:  Goal status: NOT MET (approx 50%)   4.  Pt to return to performing light IADLS (laundry/washing dishes) Baseline:  Goal status: MET - demo in clinic  5.  Pt to demo picking up items off floor w/o LOB in prep for light yardwork Baseline:  Goal status: MET - demo in clinic   ASSESSMENT:  CLINICAL IMPRESSION: Pt has met 4/5 LTG's at this time. Pt with increased awareness into deficits since evaluation and has improved w/ body awareness and attention, however still needs to be careful and min cues during certain activities.   PERFORMANCE DEFICITS in functional skills including ADLs, IADLs, coordination, dexterity, strength, FMC, GMC, mobility, body mechanics, endurance, decreased knowledge of precautions, and UE functional use, cognitive skills including attention and safety awareness,   IMPAIRMENTS are limiting patient from IADLs and leisure.   COMORBIDITIES may have co-morbidities  that affects occupational performance. Patient will benefit from skilled OT to address above impairments and improve overall function.  MODIFICATION OR ASSISTANCE TO COMPLETE EVALUATION: No modification of tasks or assist necessary to complete an evaluation.  OT OCCUPATIONAL PROFILE AND HISTORY: Problem focused assessment: Including review of records relating to presenting problem.  CLINICAL DECISION MAKING: Moderate - several treatment options, min-mod task modification necessary  REHAB POTENTIAL: Good  EVALUATION COMPLEXITY: Low    PLAN: OT FREQUENCY: 2x/week  OT DURATION: 4 weeks (PLUS EVAL)   PLANNED INTERVENTIONS: self  care/ADL training, therapeutic exercise, therapeutic activity, neuromuscular re-education, manual therapy, passive range of motion, functional mobility training, moist heat, patient/family education, cognitive remediation/compensation, visual/perceptual remediation/compensation, coping strategies training, DME and/or AE instructions, and Re-evaluation  RECOMMENDED OTHER SERVICES: NONE  CONSULTED AND AGREED WITH PLAN OF CARE: Patient and family member/caregiver  PLAN FOR NEXT SESSION: D/C O.T. since pt has met all but 1 goal. Pt given recommendations to continue to work on attention and environmental scanning at home and in [place]  Visits from Start of Care: 5  Current functional level related to goals / functional outcomes: See above   Remaining deficits: Attention - spatial awareness around corners and negotiation in tight spaces Environmental scanning   Education / Equipment: HEP, recommendations for safety and to encourage scanning   Patient agrees to discharge. Patient goals were met (4/5 goals). Patient is being discharged due to being pleased with the current functional level.Hans Eden, OT 12/16/2021, 9:37 AM

## 2021-12-31 NOTE — Telephone Encounter (Signed)
Lmom for pt to call back. 

## 2022-01-12 ENCOUNTER — Encounter: Payer: Self-pay | Admitting: Cardiovascular Disease

## 2022-01-12 ENCOUNTER — Ambulatory Visit: Payer: Medicare PPO | Attending: Cardiovascular Disease | Admitting: Cardiovascular Disease

## 2022-01-12 DIAGNOSIS — I739 Peripheral vascular disease, unspecified: Secondary | ICD-10-CM

## 2022-01-12 NOTE — Assessment & Plan Note (Signed)
[name] [name] returns today for follow-up of his PAD initially referred to me by Kerin Ransom, PA-C.  He is a cardiology patient of Dr. Jacalyn Lefevre.  He did have Doppler studies performed 11/17/2021 revealing right ABI 1.19 and left of 0.57 with a high-frequency signal in his left popliteal artery.  He continues to deny claudication.  He gets occasional shaking episodes which he relates to his stroke.  At this point, there is no indication for an invasive evaluation.  I will see him back as needed.

## 2022-01-12 NOTE — Telephone Encounter (Signed)
Lmom for pt wife to callback

## 2022-01-12 NOTE — Progress Notes (Signed)
01/12/2022 [name] [name]   Mar 13, 1946  [id number]  Primary Physician [name] Conners, MD Primary Cardiologist: [name] Harp MD [name] [name], [place]  HPI:  [name] [name] is a 76 y.o.  mildly overweight married African-American male father of 2 sons, grandfather 2 grandchildren who is a retired Surveyor, quantity at [employer].  He was referred by [name] Ransom, PA-C for peripheral vascular evaluation.    I last saw him in the office 08/28/2021.  He is accompanied by his wife [name] [name] today.  He does have a history of treated hyperlipidemia.  He had a stroke in the 15s.  He had  grafting x4 at [facility] 02/13/2016 and has remained stable since.  He did see Dr. Milinda [name], his podiatrist, because of pain in his left foot and lower extremity arterial Doppler studies ordered in our office 03/19/2019 revealed a normal right ABI with a left ABI of 0.57 and a high-grade lesion in his popliteal artery with an occluded anterior tibial.  He really denies claudication.  There is no open wound suggesting critical limb ischemia.   Since I saw him 5 months ago he continues to do well.  He was hospitalized in June for question stroke.  He was placed on dual antiplatelet therapy.  Subsequent event monitor did not show any A-fib.  He complains of some occasional shaking episode in his left leg but continues to deny claudication.   Current Meds  Medication Sig   aspirin EC 81 MG tablet Take 1 tablet (81 mg total) by mouth daily. Take daily with plavix for 3 months, then ask cardiology how they recommend you take the aspirin and plavix   clopidogrel (PLAVIX) 75 MG tablet Take 1 tablet (75 mg total) by mouth daily. Take daily with aspirin for 3 months.  Then discuss with cardiology to see what they'd like you to do with your aspirin and plavix.   rosuvastatin (CRESTOR) 20 MG tablet Take 1 tablet (20 mg total) by mouth daily. (Patient taking differently: Take 20 mg by mouth daily. Take 0.5 tablet by  mouth daily.)     No Known Allergies  Social History   Socioeconomic History   Marital status: Married    Spouse name: Not on file   Number of children: 2   Years of education: Not on file   Highest education level: Not on file  Occupational History   Not on file  Tobacco Use   Smoking status: Former    Types: Cigarettes    Quit date: 04/11/1968    Years since quitting: 53.7   Smokeless tobacco: Never   Tobacco comments:    quit in the 1970s  Vaping Use   Vaping Use: Never used  Substance and Sexual Activity   Alcohol use: Yes    Comment: Rare   Drug use: No   Sexual activity: Not on file  Other Topics Concern   Not on file  Social History Narrative   Right handed   One story home   Drinks no caffeine   Social Determinants of Health   Financial Resource Strain: Not on file  Food Insecurity: Not on file  Transportation Needs: Not on file  Physical Activity: Not on file  Stress: Not on file  Social Connections: Not on file  Intimate Partner Violence: Not on file     Review of Systems: General: negative for chills, fever, night sweats or weight changes.  Cardiovascular: negative for chest pain, dyspnea on  exertion, edema, orthopnea, palpitations, paroxysmal nocturnal dyspnea or shortness of breath Dermatological: negative for rash Respiratory: negative for cough or wheezing Urologic: negative for hematuria Abdominal: negative for nausea, vomiting, diarrhea, bright red blood per rectum, melena, or hematemesis Neurologic: negative for visual changes, syncope, or dizziness All other systems reviewed and are otherwise negative except as noted above.    Blood pressure 120/62, pulse 63, height '5\' 5"'$  (1.651 m), weight 166 lb (75.3 kg).  General appearance: alert and no distress Neck: no adenopathy, no carotid bruit, no JVD, supple, symmetrical, trachea midline, and thyroid not enlarged, symmetric, no tenderness/mass/nodules Lungs: clear to auscultation  bilaterally Heart: regular rate and rhythm, S1, S2 normal, no murmur, click, rub or gallop Extremities: extremities normal, atraumatic, no cyanosis or edema Pulses: 2+ and symmetric Skin: Skin color, texture, turgor normal. No rashes or lesions Neurologic: Grossly normal  EKG not performed today  ASSESSMENT AND PLAN:   PVD (peripheral vascular disease) ([place]) [name] [name] returns today for follow-up of his PAD initially referred to me by [name] Ransom, PA-C.  He is a cardiology patient of Dr. Jacalyn [name].  He did have Doppler studies performed 11/17/2021 revealing right ABI 1.19 and left of 0.57 with a high-frequency signal in his left popliteal artery.  He continues to deny claudication.  He gets occasional shaking episodes which he relates to his stroke.  At this point, there is no indication for an invasive evaluation.  I will see him back as needed.     [name] Harp MD FACP,FACC,FAHA, [facility] 01/12/2022 10:24 AM

## 2022-01-12 NOTE — Patient Instructions (Signed)
Medication Instructions:  Your physician recommends that you continue on your current medications as directed. Please refer to the Current Medication list given to you today.  *If you need a refill on your cardiac medications before your next appointment, please call your pharmacy*   Follow-Up: At [facility], you and your health needs are our priority.  As part of our continuing mission to provide you with exceptional heart care, we have created designated Provider Care Teams.  These Care Teams include your primary Cardiologist (physician) and Advanced Practice Providers (APPs -  Physician Assistants and Nurse Practitioners) who all work together to provide you with the care you need, when you need it.  We recommend signing up for the patient portal called "MyChart".  Sign up information is provided on this After Visit Summary.  MyChart is used to connect with patients for Virtual Visits (Telemedicine).  Patients are able to view lab/test results, encounter notes, upcoming appointments, etc.  Non-urgent messages can be sent to your provider as well.   To learn more about what you can do with MyChart, go to [email or web address].    Your next appointment:   We will see you on an as needed basis.  Provider:   Jonathan Berry, MD  

## 2022-01-14 NOTE — Telephone Encounter (Signed)
Pt has been sch for TOC with dr Jerilee Hoh

## 2022-01-24 ENCOUNTER — Encounter: Payer: Self-pay | Admitting: Podiatry

## 2022-01-24 ENCOUNTER — Ambulatory Visit: Payer: Medicare PPO | Admitting: Podiatry

## 2022-01-24 DIAGNOSIS — B351 Tinea unguium: Secondary | ICD-10-CM | POA: Diagnosis not present

## 2022-01-24 DIAGNOSIS — M79676 Pain in unspecified toe(s): Secondary | ICD-10-CM

## 2022-01-24 NOTE — Progress Notes (Signed)
He presents today with his wife chief complaint of painfully elongated toenails.  States that he had a stroke back in June when his beginning of June 1 at the end of June.  He states that his toenails are thick and painful.  Objective: Pulses are palpable bilateral.  Toenails are long thick yellow dystrophic-like mycotic painful palpation as well as debridement.  Assessment: Pain in limb secondary to onychomycosis.  Plan: Debridement of toenails 1 through 5 bilateral.

## 2022-01-25 ENCOUNTER — Ambulatory Visit: Payer: Medicare PPO | Admitting: Internal Medicine

## 2022-01-25 ENCOUNTER — Encounter: Payer: Self-pay | Admitting: Internal Medicine

## 2022-01-25 VITALS — BP 110/80 | HR 74 | Ht 65.0 in | Wt 164.9 lb

## 2022-01-25 DIAGNOSIS — E1169 Type 2 diabetes mellitus with other specified complication: Secondary | ICD-10-CM | POA: Diagnosis not present

## 2022-01-25 DIAGNOSIS — I2510 Atherosclerotic heart disease of native coronary artery without angina pectoris: Secondary | ICD-10-CM | POA: Diagnosis not present

## 2022-01-25 DIAGNOSIS — E785 Hyperlipidemia, unspecified: Secondary | ICD-10-CM | POA: Diagnosis not present

## 2022-01-25 DIAGNOSIS — I6930 Unspecified sequelae of cerebral infarction: Secondary | ICD-10-CM | POA: Diagnosis not present

## 2022-01-25 LAB — POCT GLYCOSYLATED HEMOGLOBIN (HGB A1C): Hemoglobin A1C: 6.6 % — AB (ref 4.0–5.6)

## 2022-01-25 MED ORDER — ATORVASTATIN CALCIUM 40 MG PO TABS: 40.0000 mg | ORAL_TABLET | Freq: Every day | ORAL | 1 refills | Status: DC

## 2022-01-25 NOTE — Progress Notes (Signed)
Established Patient Office Visit     CC/Reason for Visit: Establish care, discuss chronic medical conditions  HPI: [name] [name] is a 76 y.o. male who is coming in today for the above mentioned reasons. Past Medical History is significant for: Coronary artery disease with a triple-vessel CABG in November 2017 followed locally by Dr. Stanford Breed.  He has had 3 CVAs, most recently in June.  At that time he was discharged home on dual antiplatelet therapy for 3 months as well as rosuvastatin 20 mg.  He also has a history of PAD, recently saw Dr. Gwenlyn Found without indication for invasive treatment.  He has not been taking his rosuvastatin as it "does not agree with his body".  Blood pressure has been well controlled, A1c in office today demonstrates that his prediabetes has progressed to frank diabetes as it is now 6.6.  He is otherwise feeling well.  Declines flu and COVID vaccines.   Past Medical/Surgical History: Past Medical History:  Diagnosis Date   Constipation    Diabetes mellitus without complication ([place])    borderline/ pre diabetic   NSTEMI (non-ST elevated myocardial infarction) ([place])    at [place]   Stroke ([place])    L side - 40 years ago    Past Surgical History:  Procedure Laterality Date   CARDIAC CATHETERIZATION     CATARACT EXTRACTION Bilateral    05/2021; 07/2021   CORONARY ARTERY BYPASS GRAFT     at [place] History:  reports that he quit smoking about 53 years ago. His smoking use included cigarettes. He has never used smokeless tobacco. He reports current alcohol use. He reports that he does not use drugs.  Allergies: No Known Allergies  Family History:  Family History  Problem Relation Age of Onset   Dementia Mother    Liver disease Father    Diabetes Sister    Cirrhosis Brother      Current Outpatient Medications:    aspirin EC 81 MG tablet, Take 1 tablet (81 mg total) by mouth daily. Take daily with plavix for 3 months, then ask cardiology  how they recommend you take the aspirin and plavix, Disp: 30 tablet, Rfl: 3   atorvastatin (LIPITOR) 40 MG tablet, Take 1 tablet (40 mg total) by mouth daily., Disp: 90 tablet, Rfl: 1  Review of Systems:   Constitutional: Denies fever, chills, diaphoresis, appetite change and fatigue.  HEENT: Denies photophobia, eye pain, redness, hearing loss, ear pain, congestion, sore throat, rhinorrhea, sneezing, mouth sores, trouble swallowing, neck pain, neck stiffness and tinnitus.   Respiratory: Denies SOB, DOE, cough, chest tightness,  and wheezing.   Cardiovascular: Denies chest pain, palpitations and leg swelling.  Gastrointestinal: Denies nausea, vomiting, abdominal pain, diarrhea, constipation, blood in stool and abdominal distention.  Genitourinary: Denies dysuria, urgency, frequency, hematuria, flank pain and difficulty urinating.  Endocrine: Denies: hot or cold intolerance, sweats, changes in hair or nails, polyuria, polydipsia. Musculoskeletal: Denies myalgias, back pain, joint swelling, arthralgias and gait problem.  Skin: Denies pallor, rash and wound.  Neurological: Denies dizziness, seizures, syncope,  light-headedness and headaches.  Hematological: Denies adenopathy. Easy bruising, personal or family bleeding history  Psychiatric/Behavioral: Denies suicidal ideation, mood changes, confusion, nervousness, sleep disturbance and agitation    Physical Exam: Vitals:   01/25/22 1328  BP: 110/80  Pulse: 74  SpO2: 98%  Weight: 164 lb 14.4 oz (74.8 kg)  Height: '5\' 5"'$  (1.651 m)    Body mass index is 27.44 kg/m.  Constitutional: NAD, calm, comfortable Eyes: PERRL, lids and conjunctivae normal ENMT: Mucous membranes are moist.  Respiratory: clear to auscultation bilaterally, no wheezing, no crackles. Normal respiratory effort. No accessory muscle use.  Cardiovascular: Regular rate and rhythm, no murmurs / rubs / gallops. No extremity edema.  Psychiatric: Normal judgment and  insight. Alert and oriented x 3. Normal mood.    Impression and Plan:  Type 2 diabetes mellitus with other specified complication, without long-term current use of insulin ([place]) - Plan: POCT glycosylated hemoglobin (Hb A1C), Amb ref to Medical Nutrition Therapy-MNT  History of CVA with residual deficit  Dyslipidemia, goal LDL below 70 - Plan: atorvastatin (LIPITOR) 40 MG tablet  Coronary artery disease involving native heart without angina pectoris, unspecified vessel or lesion type  -In office A1c of 6.6 makes him a new onset diabetic.  I will send him to medical nutritional therapy for diet modifications.  Okay to hold off on diabetic medications for now, I will follow-up with him in 3 months. -He is still taking dual antiplatelet therapy, charts from his hospitalization in June have been reviewed and he was advised to do it for 3 months only, have asked him to discontinue Plavix and continue aspirin alone. -He is unable to tolerate rosuvastatin, cannot give me a specific symptom but tells me "it does not agree with my body".  I will go ahead and switch out to atorvastatin 40 mg daily. -He declines flu and COVID vaccines despite counseling.  Time spent:33 minutes reviewing chart, interviewing and examining patient and formulating plan of care.      Lelon Frohlich, MD [facility]

## 2022-01-31 ENCOUNTER — Encounter: Payer: Self-pay | Admitting: Internal Medicine

## 2022-02-04 ENCOUNTER — Encounter: Payer: Medicare PPO | Attending: Internal Medicine | Admitting: Dietician

## 2022-02-04 ENCOUNTER — Encounter: Payer: Self-pay | Admitting: Dietician

## 2022-02-04 DIAGNOSIS — E119 Type 2 diabetes mellitus without complications: Secondary | ICD-10-CM

## 2022-02-04 DIAGNOSIS — Z713 Dietary counseling and surveillance: Secondary | ICD-10-CM | POA: Insufficient documentation

## 2022-02-04 DIAGNOSIS — E1169 Type 2 diabetes mellitus with other specified complication: Secondary | ICD-10-CM | POA: Insufficient documentation

## 2022-02-04 NOTE — Progress Notes (Signed)
Diabetes Self-Management Education  Visit Type: First/Initial  Appt. Start Time: 1035 Appt. End Time: 1062  02/04/2022  [name], identified by name and date of birth, is a 76 y.o. male with a diagnosis of Diabetes: Type 2.   ASSESSMENT   Primary concern: Pt is here today with recent diabetes diagnosis and wants to know how he should be eating. His wife states she does most of the cooking and wants to know what she should be purchasing and making.   History includes: type 2 diabetes, MI, stroke.  Labs noted: A1C 6.6% 01/25/22 Medications include: reviewed Supplements: none  Pt is present today with his wife.   Patient lives with wife. Pt's wife shopping and cooking.  Pt states they started following a heart healthy diet, cutting back on salt and frying foods. They both state they are confused about how they should be eating because of all the information readily available on the Internet.   Pt feels like he doesn't have the energy he used to have.   Pt does physical therapy exercises at home (step ups, resistance bands), and pt and his wife walk for 15 minutes after dinner.   Height '5\' 5"'$  (1.651 m), weight 165 lb 9.6 oz (75.1 kg). Body mass index is 27.56 kg/m.   Diabetes Self-Management Education - 02/04/22 1041       Visit Information   Visit Type First/Initial      Initial Visit   Diabetes Type Type 2    Are you currently following a meal plan? No    Are you taking your medications as prescribed? Not on Medications      Health Coping   How would you rate your overall health? Good      Psychosocial Assessment   Patient Belief/Attitude about Diabetes Motivated to manage diabetes    What is the hardest part about your diabetes right now, causing you the most concern, or is the most worrisome to you about your diabetes?   Making healty food and beverage choices    Self-care barriers None    Self-management support Doctor's office;Family    Other persons  present Patient;Spouse/SO    Patient Concerns Nutrition/Meal planning;Healthy Lifestyle    Special Needs None    Preferred Learning Style No preference indicated    Learning Readiness Ready    How often do you need to have someone help you when you read instructions, pamphlets, or other written materials from your doctor or pharmacy? 1 - Never    What is the last grade level you completed in school? masters      Pre-Education Assessment   Patient understands the diabetes disease and treatment process. Needs Instruction    Patient understands incorporating nutritional management into lifestyle. Needs Instruction    Patient undertands incorporating physical activity into lifestyle. Needs Instruction    Patient understands using medications safely. Needs Instruction    Patient understands monitoring blood glucose, interpreting and using results Needs Instruction    Patient understands prevention, detection, and treatment of acute complications. Needs Instruction    Patient understands prevention, detection, and treatment of chronic complications. Needs Instruction    Patient understands how to develop strategies to address psychosocial issues. Needs Instruction    Patient understands how to develop strategies to promote health/change behavior. Needs Instruction      Complications   Last HgB A1C per patient/outside source 6.6 %    How often do you check your blood sugar? Not recommended by provider  Number of hypoglycemic episodes per month 0    Have you had a dilated eye exam in the past 12 months? Yes    Have you had a dental exam in the past 12 months? Yes    Are you checking your feet? Yes    How many days per week are you checking your feet? 4      Dietary Intake   Breakfast egg and slice of deli ham and 1/2 piece of toast, 1/4 avocado    Snack (morning) none OR 2 [employer] fig [place]    Lunch vegetable soup OR low sodium clam chowder with club crackers OR sandwich on white bread    Snack  (afternoon) 2 [employer] fig [place]    Dinner baked chicken OR beef tenderloin OR fish with vegetable and baked potato    Snack (evening) none    Beverage(s) water, gatorade regular, orangeaid      Activity / Exercise   Activity / Exercise Type Light (walking / raking leaves)    How many days per week do you exercise? 7    How many minutes per day do you exercise? 15    Total minutes per week of exercise 105      Patient Education   Previous Diabetes Education No    Disease Pathophysiology Definition of diabetes, type 1 and 2, and the diagnosis of diabetes;Factors that contribute to the development of diabetes;Explored patient's options for treatment of their diabetes    Healthy Eating Role of diet in the treatment of diabetes and the relationship between the three main macronutrients and blood glucose level;Food label reading, portion sizes and measuring food.;Carbohydrate counting;Plate Method;Reviewed blood glucose goals for pre and post meals and how to evaluate the patients' food intake on their blood glucose level.;Meal timing in regards to the patients' current diabetes medication.;Meal options for control of blood glucose level and chronic complications.    Being Active Role of exercise on diabetes management, blood pressure control and cardiac health.;Identified with patient nutritional and/or medication changes necessary with exercise.;Helped patient identify appropriate exercises in relation to his/her diabetes, diabetes complications and other health issue.    Medications Reviewed patients medication for diabetes, action, purpose, timing of dose and side effects.;Reviewed medication adjustment guidelines for hyperglycemia and sick days.    Monitoring Identified appropriate SMBG and/or A1C goals.;Daily foot exams;Yearly dilated eye exam    Acute complications Taught prevention, symptoms, and  treatment of hypoglycemia - the 15 rule.;Discussed and identified patients' prevention, symptoms, and  treatment of hyperglycemia.;Educated on sick day management    Chronic complications Relationship between chronic complications and blood glucose control;Assessed and discussed foot care and prevention of foot problems;Lipid levels, blood glucose control and heart disease;Identified and discussed with patient  current chronic complications;Dental care;Retinopathy and reason for yearly dilated eye exams;Nephropathy, what it is, prevention of, the use of ACE, ARB's and early detection of through urine microalbumia.;Reviewed with patient heart disease, higher risk of, and prevention    Diabetes Stress and Support Identified and addressed patients feelings and concerns about diabetes;Worked with patient to identify barriers to care and solutions;Role of stress on diabetes    Lifestyle and Health Coping Lifestyle issues that need to be addressed for better diabetes care      Individualized Goals (developed by patient)   Nutrition General guidelines for healthy choices and portions discussed    Physical Activity Exercise 3-5 times per week;30 minutes per day    Medications Not Applicable    Monitoring  Not Applicable    Problem Solving Eating Pattern    Reducing Risk do foot checks daily;treat hypoglycemia with 15 grams of carbs if blood glucose less than '70mg'$ /dL    Health Coping Ask for help with psychological, social, or emotional issues      Post-Education Assessment   Patient understands the diabetes disease and treatment process. Comprehends key points    Patient understands incorporating nutritional management into lifestyle. Comprehends key points    Patient undertands incorporating physical activity into lifestyle. Demonstrates understanding / competency    Patient understands using medications safely. Comphrehends key points    Patient understands monitoring blood glucose, interpreting and using results Comprehends key points    Patient understands prevention, detection, and treatment of acute  complications. Comprehends key points    Patient understands prevention, detection, and treatment of chronic complications. Comprehends key points    Patient understands how to develop strategies to address psychosocial issues. Comprehends key points    Patient understands how to develop strategies to promote health/change behavior. Comprehends key points      Outcomes   Expected Outcomes Demonstrated interest in learning. Expect positive outcomes    Future DMSE 3-4 months    Program Status Not Completed             Individualized Plan for Diabetes Self-Management Training:   Learning Objective:  Patient will have a greater understanding of diabetes self-management. Patient education plan is to attend individual and/or group sessions per assessed needs and concerns.   Plan:   Patient Instructions  Aim for 150 minutes of physical activity weekly. -continue walking, consider walking twice a day -continue your physical therapy exercises  Eat more Non-Starchy Vegetables. Aim to make 1/2 of your plate non-starchy vegetables at least twice per day.   Minimize added sugars and refined grains.  Choose whole foods over processed. Whole wheat and whole grain.   Make simple meals at home more often than eating out.  Aim to eat within 1-2 hours of waking up and every 3-5 hours following.   Rethink what you drink. Choose beverages without added sugar. Look for 0 carbs on the label. -choose zero sugar drinks  Tips for increasing water intake: -Keep a glass of water by your bedside so that you're encouraged to drink it upon waking -Carry a re-usable, re-fillable water bottle -Add fruit such as lemon, lime, berries, or cucumbers to your water -Try sparkling water -Make herbal tea and drink it hot or iced  [email or web address]    Expected Outcomes:  Demonstrated interest in learning. Expect positive  outcomes  Education material provided: ADA - How to Thrive: A Guide for Your Journey with Diabetes, My Plate, and Snack sheet, Balanced Plate Food Groups  If problems or questions, patient to contact team via:  Phone  Future DSME appointment: 3-4 months

## 2022-02-04 NOTE — Patient Instructions (Addendum)
Aim for 150 minutes of physical activity weekly. -continue walking, consider walking twice a day -continue your physical therapy exercises  Eat more Non-Starchy Vegetables. Aim to make 1/2 of your plate non-starchy vegetables at least twice per day.   Minimize added sugars and refined grains.  Choose whole foods over processed. Whole wheat and whole grain.   Make simple meals at home more often than eating out.  Aim to eat within 1-2 hours of waking up and every 3-5 hours following.   Rethink what you drink. Choose beverages without added sugar. Look for 0 carbs on the label. -choose zero sugar drinks  Tips for increasing water intake: -Keep a glass of water by your bedside so that you're encouraged to drink it upon waking -Carry a re-usable, re-fillable water bottle -Add fruit such as lemon, lime, berries, or cucumbers to your water -Try sparkling water -Make herbal tea and drink it hot or iced  [email or web address]

## 2022-03-21 NOTE — Progress Notes (Unsigned)
Cardiology Clinic Note   Patient Name: [name] [name] Date of Encounter: 03/22/2022  Primary Care Provider:  Isaac Bliss, Rayford Halsted, MD Primary Cardiologist:  Kirk Ruths, MD  Patient Profile    [name] [name] 76 year old male presents the clinic today for review of his cardiac event monitor.  Past Medical History    Past Medical History:  Diagnosis Date   Constipation    Diabetes mellitus without complication ([place])    borderline/ pre diabetic   NSTEMI (non-ST elevated myocardial infarction) ([place])    at [place]   Stroke ([place])    L side - 40 years ago   Past Surgical History:  Procedure Laterality Date   CARDIAC CATHETERIZATION     CATARACT EXTRACTION Bilateral    05/2021; 07/2021   CORONARY ARTERY BYPASS GRAFT     at [facility]    Allergies  No Known Allergies  History of Present Illness    [name] [name] coronary artery disease status post CABG in 2017, CVA 1980's, peripheral arterial disease, and prediabetes.  He presented to the emergency department on 10/06/2021 and was discharged on 10/07/2021.  He reported a syncopal episode during previous stay.  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  He then had a brief episode of loss of consciousness.  He did not feel he was injured and did not tell his wife that he had had the event.  He reported the episode to his wife the following day and she urged him to seek evaluation in the emergency department.  He denied new weakness or numbness.  He indicated that he had a similar episode 3 weeks prior with [place]  He had been working outside in the heat, felt disoriented and felt dizziness.  His symptoms resolved.  He was admitted for CVA evaluation.  Neurology was consulted and suspected a left frontal acute infarct.  Dual antiplatelet therapy was recommended for 3 months.  A 30-day cardiac event monitor was ordered.  He presented to the clinic 8/23 for follow-up evaluation and stated he  noticed occasional head fullness and pressure.  He noticed that has blood pressure is more labile since having a stroke.  He reported that his blood pressure will occasionally increase to around 902 systolic.  He also notices some left-sided weakness and fatigue.  He had been working with physical therapy 2 times per week.  We reviewed his recent hospitalization and CVA.  He and his wife expressed understanding.  He returned to his cardiac event monitor last week and has not noticed any palpitations.  We had not yet received the cardiac event monitor result and I explained that we will call him to review results.  They expressed understanding.  I will continue his current medication regimen, have him increase his physical activity as tolerated, have him continue to monitor his blood pressure and plan follow-up in 3 to 4 months.  He was seen in follow-up by Dr. Gwenlyn Found on 01/12/2022.  During that time he continues to do well.  His hospitalization 6/23 was reviewed.  He complained of occasional shaking episodes in his left leg.  He denied claudication.  As needed follow-up was planned.  He presents to the clinic today for follow-up evaluation and states he feels well.  He is somewhat physically active doing.  Yard type activities.  We reviewed his EKG which shows normal sinus rhythm.  I reviewed his cardiac event monitor.  He and his wife expressed understanding.  He is  following a more strict diet because he was told that he was borderline diabetic.  He reports that when he takes his a atorvastatin and aspirin together he also feels funny.  He has been separating the medications.  I will have him continue his current diet, increase his physical activity as tolerated and plan follow-up in 6 months.  Today he denies chest pain, shortness of breath, lower extremity edema, fatigue, palpitations, melena, hematuria, hemoptysis, diaphoresis, weakness, presyncope, syncope, orthopnea, and PND.    Home Medications     Prior to Admission medications   Medication Sig Start Date End Date Taking? Authorizing Provider  aspirin EC 81 MG tablet Take 1 tablet (81 mg total) by mouth daily. Take daily with plavix for 3 months, then ask cardiology how they recommend you take the aspirin and plavix 10/07/21 02/04/22  Elodia Florence., MD  clopidogrel (PLAVIX) 75 MG tablet Take 1 tablet (75 mg total) by mouth daily. Take daily with aspirin for 3 months.  Then discuss with cardiology to see what they'd like you to do with your aspirin and plavix. 10/07/21 02/04/22  Elodia Florence., MD  rosuvastatin (CRESTOR) 20 MG tablet Take 1 tablet (20 mg total) by mouth daily. 10/08/21 10/03/22  Elodia Florence., MD    Family History    Family History  Problem Relation Age of Onset   Dementia Mother    Liver disease Father    Diabetes Sister    Cirrhosis Brother    He indicated that his mother is deceased. He indicated that his father is deceased. He indicated that his sister is deceased. He indicated that his brother is deceased. He indicated that his maternal grandmother is deceased. He indicated that his maternal grandfather is deceased. He indicated that his paternal grandmother is deceased. He indicated that his paternal grandfather is deceased.  Social History    Social History   Socioeconomic History   Marital status: Married    Spouse name: Not on file   Number of children: 2   Years of education: Not on file   Highest education level: Not on file  Occupational History   Not on file  Tobacco Use   Smoking status: Former    Types: Cigarettes    Quit date: 04/11/1968    Years since quitting: 53.9   Smokeless tobacco: Never   Tobacco comments:    quit in the 1970s  Vaping Use   Vaping Use: Never used  Substance and Sexual Activity   Alcohol use: Yes    Comment: Rare   Drug use: No   Sexual activity: Not on file  Other Topics Concern   Not on file  Social History Narrative   Right handed    One story home   Drinks no caffeine   Social Determinants of Health   Financial Resource Strain: Not on file  Food Insecurity: Not on file  Transportation Needs: Not on file  Physical Activity: Not on file  Stress: Not on file  Social Connections: Not on file  Intimate Partner Violence: Not on file     Review of Systems    General:  No chills, fever, night sweats or weight changes.  Cardiovascular:  No chest pain, dyspnea on exertion, edema, orthopnea, palpitations, paroxysmal nocturnal dyspnea. Dermatological: No rash, lesions/masses Respiratory: No cough, dyspnea Urologic: No hematuria, dysuria Abdominal:   No nausea, vomiting, diarrhea, bright red blood per rectum, melena, or hematemesis Neurologic:  No visual changes, wkns, changes in  mental status. All other systems reviewed and are otherwise negative except as noted above.  Physical Exam    VS:  BP 116/68   [name] 64   Ht '5\' 5"'$  (1.651 m)   Wt 163 lb 12.8 oz (74.3 kg)   SpO2 97%   BMI 27.26 kg/m  , BMI Body mass index is 27.26 kg/m. GEN: Well nourished, well developed, in no acute distress. HEENT: normal. Neck: Supple, no JVD, carotid bruits, or masses. Cardiac: RRR, no murmurs, rubs, or gallops. No clubbing, cyanosis, edema.  Radials/DP/PT 2+ and equal bilaterally.  Respiratory:  Respirations regular and unlabored, clear to auscultation bilaterally. GI: Soft, nontender, nondistended, BS + x 4. MS: no deformity or atrophy. Skin: warm and dry, no rash. Neuro:  Strength and sensation are intact. Psych: Normal affect.  Accessory Clinical Findings    Recent Labs: 10/07/2021: ALT 16; BUN 9; Creatinine, Ser 0.90; Hemoglobin 14.1; Platelets 290; Potassium 3.7; Sodium 138   Recent Lipid Panel    Component Value Date/Time   CHOL 200 10/06/2021 1421   CHOL 144 11/15/2016 0929   TRIG 107 10/06/2021 1421   HDL 45 10/06/2021 1421   HDL 46 11/15/2016 0929   CHOLHDL 4.4 10/06/2021 1421   VLDL 21 10/06/2021 1421    LDLCALC 134 (H) 10/06/2021 1421   LDLCALC 84 11/15/2016 0929    ECG personally reviewed by me today-none today.  Echocardiogram 10/07/2021  IMPRESSIONS     1. Left ventricular ejection fraction, by estimation, is 50 to 55%. The  left ventricle has low normal function. The left ventricle demonstrates  regional wall motion abnormalities (see scoring diagram/findings for  description). Left ventricular diastolic   parameters are consistent with Grade I diastolic dysfunction (impaired  relaxation). There is hypokinesis of the left ventricular, apical inferior  wall.   2. Right ventricular systolic function is normal. The right ventricular  size is normal. There is normal pulmonary artery systolic pressure. The  estimated right ventricular systolic pressure is 97.6 mmHg.   3. The mitral valve is normal in structure. Trivial mitral valve  regurgitation. No evidence of mitral stenosis.   4. The aortic valve is tricuspid. Aortic valve regurgitation is not  visualized. Aortic valve sclerosis/calcification is present, without any  evidence of aortic stenosis. Aortic valve area, by VTI measures 1.98 cm.  Aortic valve mean gradient measures  4.0 mmHg. Aortic valve Vmax measures 1.21 m/s.   5. The inferior vena cava is normal in size with greater than 50%  respiratory variability, suggesting right atrial pressure of 3 mmHg.   6. Recommend repeat limited study with definity contrast to define wall  motion   Cardiac event monitor 10/12/2021  SR/SB/ST Short run (9 beats) of NSVT  Assessment & Plan   1.  Syncope-continues to notice occasional intermittent episodes of dizziness without pattern.  Cardiac event monitor 10/12/2021 showed SR, SB, ST, and 1 brief episode of NSVT.  Results reviewed Maintain p.o. hydration, change positions slowly Lower extremity support stockings No plans for further cardiac evaluation at this time.  Essential hypertension-BP today 116/68.  Reports labile blood  pressures at home with systolic blood pressures occasionally in the 150s. Continue blood pressure log Heart healthy low-sodium diet Increase physical activity as tolerated  Coronary artery disease-denies recent episodes of chest discomfort.  Status post CABG x 411/17 Continue aspirin, Lipitor Heart healthy low-sodium diet-salty 6 given Increase physical activity as tolerated  Hyperlipidemia-LDL 134 on 10/06/21. Continue aspirin, lipitor Heart healthy low-sodium high-fiber diet Increase physical  activity as tolerated  Peripheral vascular disease-denies claudication.  Remains physically active. Continue aspirin, Lipitor Heart healthy low-sodium diet-salty 6 given Increase physical activity as tolerated Follows with Dr.Berry  CVA-neurologically intact, no deficits.  Noted to have occluded cerebral artery.  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  Seen by neurology who recommended dual antiplatelet therapy for 3 months with Plavix and aspirin.  Therapy completed. Follows with neurology  Disposition: Follow-up with Dr. Stanford Breed in 6 months.   Jossie Ng. Marlise Fahr NP-C     03/22/2022, 11:59 AM [address] Office [phone] Fax [phone]  Notice: This dictation was prepared with Dragon dictation along with smaller phrase technology. Any transcriptional errors that result from this process are unintentional and may not be corrected upon review.  I spent 14 minutes examining this patient, reviewing medications, and using patient centered shared decision making involving her cardiac care.  Prior to her visit I spent greater than 20 minutes reviewing her past medical history,  medications, and prior cardiac tests.

## 2022-03-22 ENCOUNTER — Ambulatory Visit: Payer: Medicare PPO | Attending: General Practice | Admitting: General Practice

## 2022-03-22 ENCOUNTER — Encounter: Payer: Self-pay | Admitting: General Practice

## 2022-03-22 VITALS — BP 116/68 | HR 64 | Ht 65.0 in | Wt 163.8 lb

## 2022-03-22 DIAGNOSIS — I10 Essential (primary) hypertension: Secondary | ICD-10-CM | POA: Diagnosis not present

## 2022-03-22 DIAGNOSIS — I2510 Atherosclerotic heart disease of native coronary artery without angina pectoris: Secondary | ICD-10-CM

## 2022-03-22 DIAGNOSIS — E785 Hyperlipidemia, unspecified: Secondary | ICD-10-CM

## 2022-03-22 DIAGNOSIS — I639 Cerebral infarction, unspecified: Secondary | ICD-10-CM | POA: Diagnosis not present

## 2022-03-22 DIAGNOSIS — I739 Peripheral vascular disease, unspecified: Secondary | ICD-10-CM

## 2022-03-22 DIAGNOSIS — R55 Syncope and collapse: Secondary | ICD-10-CM

## 2022-03-22 NOTE — Patient Instructions (Signed)
Medication Instructions:  The current medical regimen is effective;  continue present plan and medications as directed. Please refer to the Current Medication list given to you today.  *If you need a refill on your cardiac medications before your next appointment, please call your pharmacy*  Lab Work: NONE If you have labs (blood work) drawn today and your tests are completely normal, you will receive your results only by: [place] (if you have MyChart) OR A paper copy in the mail If you have any lab test that is abnormal or we need to change your treatment, we will call you to review the results.  Testing/Procedures: NONE  Follow-Up: At [facility], you and your health needs are our priority.  As part of our continuing mission to provide you with exceptional heart care, we have created designated Provider Care Teams.  These Care Teams include your primary Cardiologist (physician) and Advanced Practice Providers (APPs -  Physician Assistants and Nurse Practitioners) who all work together to provide you with the care you need, when you need it.  Your next appointment:   6 month(s)  The format for your next appointment:   In Person  Provider:   Kirk Ruths, MD     Other Instructions SEE [place] About Sugar        Heart-Healthy Eating Plan Eating a healthy diet is important for the health of your heart. A heart-healthy eating plan includes: Eating less unhealthy fats. Eating more healthy fats. Eating less salt in your food. Salt is also called sodium. Making other changes in your diet. Talk with your doctor or a diet specialist (dietitian) to create an eating plan that is right for you. Cooking Avoid frying your food. Try to bake, boil, grill, or broil it instead. You can also reduce fat by: Removing the skin from poultry. Removing all visible fats from meats. Steaming vegetables in water or  broth. Meal planning  At meals, divide your plate into four equal parts: Fill one-half of your plate with vegetables and green salads. Fill one-fourth of your plate with whole grains. Fill one-fourth of your plate with lean protein foods. Eat 2-4 cups of vegetables per day. One cup of vegetables is: 1 cup (91 g) broccoli or cauliflower florets. 2 medium carrots. 1 large bell pepper. 1 large sweet potato. 1 large tomato. 1 medium white potato. 2 cups (150 g) raw leafy greens. Eat 1-2 cups of fruit per day. One cup of fruit is: 1 small apple 1 large banana 1 cup (237 g) mixed fruit, 1 large orange,  cup (82 g) dried fruit, 1 cup (240 mL) 100% fruit juice. Eat more foods that have soluble fiber. These are apples, broccoli, carrots, beans, peas, and barley. Try to get 20-30 g of fiber per day. Eat 4-5 servings of nuts, legumes, and seeds per week: 1 serving of dried beans or legumes equals  cup (90 g) cooked. 1 serving of nuts is  oz (12 almonds, 24 pistachios, or 7 walnut halves). 1 serving of seeds equals  oz (8 g). General information Eat more home-cooked food. Eat less restaurant, buffet, and fast food. Limit or avoid alcohol. Limit foods that are high in starch and sugar. Avoid fried foods. Lose weight if you are overweight. Keep track of how much salt (sodium) you eat. This is important if you have high blood pressure. Ask your doctor to tell you more about this. Try to add vegetarian meals each  week. Fats Choose healthy fats. These include olive oil and canola oil, flaxseeds, walnuts, almonds, and seeds. Eat more omega-3 fats. These include salmon, mackerel, sardines, tuna, flaxseed oil, and ground flaxseeds. Try to eat fish at least 2 times each week. Check food labels. Avoid foods with trans fats or high amounts of saturated fat. Limit saturated fats. These are often found in animal products, such as meats, butter, and cream. These are also found in plant foods,  such as palm oil, palm kernel oil, and coconut oil. Avoid foods with partially hydrogenated oils in them. These have trans fats. Examples are stick margarine, some tub margarines, cookies, crackers, and other baked goods. What foods should I eat? Fruits All fresh, canned (in natural juice), or frozen fruits. Vegetables Fresh or frozen vegetables (raw, steamed, roasted, or grilled). Green salads. Grains Most grains. Choose whole wheat and whole grains most of the time. Rice and pasta, including brown rice and pastas made with whole wheat. Meats and other proteins Lean, well-trimmed beef, veal, pork, and lamb. Chicken and [place] without skin. All fish and shellfish. Wild duck, rabbit, pheasant, and venison. Egg whites or low-cholesterol egg substitutes. Dried beans, peas, lentils, and tofu. Seeds and most nuts. Dairy Low-fat or nonfat cheeses, including ricotta and mozzarella. Skim or 1% milk that is liquid, powdered, or evaporated. Buttermilk that is made with low-fat milk. Nonfat or low-fat yogurt. Fats and oils Non-hydrogenated (trans-free) margarines. Vegetable oils, including soybean, sesame, sunflower, olive, peanut, safflower, corn, canola, and cottonseed. Salad dressings or mayonnaise made with a vegetable oil. Beverages Mineral water. Coffee and tea. Diet carbonated beverages. Sweets and desserts Sherbet, gelatin, and fruit ice. Small amounts of dark chocolate. Limit all sweets and desserts. Seasonings and condiments All seasonings and condiments. The items listed above may not be a complete list of foods and drinks you can eat. Contact a dietitian for more options. What foods should I avoid? Fruits Canned fruit in heavy syrup. Fruit in cream or butter sauce. Fried fruit. Limit coconut. Vegetables Vegetables cooked in cheese, cream, or butter sauce. Fried vegetables. Grains Breads that are made with saturated or trans fats, oils, or whole milk. Croissants. Sweet rolls. Donuts.  High-fat crackers, such as cheese crackers. Meats and other proteins Fatty meats, such as hot dogs, ribs, sausage, bacon, rib-eye roast or steak. High-fat deli meats, such as salami and bologna. Caviar. Domestic duck and goose. Organ meats, such as liver. Dairy Cream, sour cream, cream cheese, and creamed cottage cheese. Whole-milk cheeses. Whole or 2% milk that is liquid, evaporated, or condensed. Whole buttermilk. Cream sauce or high-fat cheese sauce. Yogurt that is made from whole milk. Fats and oils Meat fat, or shortening. Cocoa butter, hydrogenated oils, palm oil, coconut oil, palm kernel oil. Solid fats and shortenings, including bacon fat, salt pork, lard, and butter. Nondairy cream substitutes. Salad dressings with cheese or sour cream. Beverages Regular sodas and juice drinks with added sugar. Sweets and desserts Frosting. Pudding. Cookies. Cakes. Pies. Milk chocolate or white chocolate. Buttered syrups. Full-fat ice cream or ice cream drinks. The items listed above may not be a complete list of foods and drinks to avoid. Contact a dietitian for more information. Summary Heart-healthy meal planning includes eating less unhealthy fats, eating more healthy fats, and making other changes in your diet. Eat a balanced diet. This includes fruits and vegetables, low-fat or nonfat dairy, lean protein, nuts and legumes, whole grains, and heart-healthy oils and fats. This information is not intended to replace advice given  to you by your health care provider. Make sure you discuss any questions you have with your health care provider. Document Revised: 05/03/2021 Document Reviewed: 05/03/2021 Elsevier Patient Education  [place].     High-Fiber Eating Plan Fiber, also called dietary fiber, is a type of carbohydrate. It is found foods such as fruits, vegetables, whole grains, and beans. A high-fiber diet can have many health benefits. Your health care provider may recommend a  high-fiber diet to help: Prevent constipation. Fiber can make your bowel movements more regular. Lower your cholesterol. Relieve the following conditions: Inflammation of veins in the anus (hemorrhoids). Inflammation of specific areas of the digestive tract (uncomplicated diverticulosis). A problem of the large intestine, also called the colon, that sometimes causes pain and diarrhea (irritable bowel syndrome, or IBS). Prevent overeating as part of a weight-loss plan. Prevent heart disease, type 2 diabetes, and certain cancers. What are tips for following this plan? Reading food labels  Check the nutrition facts label on food products for the amount of dietary fiber. Choose foods that have 5 grams of fiber or more per serving. The goals for recommended daily fiber intake include: Men (age 27 or younger): 34-38 g. Men (over age 58): 28-34 g. Women (age 18 or younger): 25-28 g. Women (over age 60): 22-25 g. Your daily fiber goal is _____________ g. Shopping Choose whole fruits and vegetables instead of processed forms, such as apple juice or applesauce. Choose a wide variety of high-fiber foods such as avocados, lentils, oats, and kidney beans. Read the nutrition facts label of the foods you choose. Be aware of foods with added fiber. These foods often have high sugar and sodium amounts per serving. Cooking Use whole-grain flour for baking and cooking. Cook with brown rice instead of white rice. Meal planning Start the day with a breakfast that is high in fiber, such as a cereal that contains 5 g of fiber or more per serving. Eat breads and cereals that are made with whole-grain flour instead of refined flour or white flour. Eat brown rice, bulgur wheat, or millet instead of white rice. Use beans in place of meat in soups, salads, and pasta dishes. Be sure that half of the grains you eat each day are whole grains. General information You can get the recommended daily intake of dietary  fiber by: Eating a variety of fruits, vegetables, grains, nuts, and beans. Taking a fiber supplement if you are not able to take in enough fiber in your diet. It is better to get fiber through food than from a supplement. Gradually increase how much fiber you consume. If you increase your intake of dietary fiber too quickly, you may have bloating, cramping, or gas. Drink plenty of water to help you digest fiber. Choose high-fiber snacks, such as berries, raw vegetables, nuts, and popcorn. What foods should I eat? Fruits Berries. Pears. Apples. Oranges. Avocado. Prunes and raisins. Dried figs. Vegetables Sweet potatoes. Spinach. Kale. Artichokes. Cabbage. Broccoli. Cauliflower. Green peas. Carrots. Squash. Grains Whole-grain breads. Multigrain cereal. Oats and oatmeal. Brown rice. Barley. Bulgur wheat. [place]. Quinoa. Bran muffins. Popcorn. Rye wafer crackers. Meats and other proteins Navy beans, kidney beans, and pinto beans. Soybeans. Split peas. Lentils. Nuts and seeds. Dairy Fiber-fortified yogurt. Beverages Fiber-fortified soy milk. Fiber-fortified orange juice. Other foods Fiber bars. The items listed above may not be a complete list of recommended foods and beverages. Contact a dietitian for more information. What foods should I avoid? Fruits Fruit juice. Cooked, strained fruit. Vegetables  Fried potatoes. Canned vegetables. Well-cooked vegetables. Grains White bread. Pasta made with refined flour. White rice. Meats and other proteins Fatty cuts of meat. Fried chicken or fried fish. Dairy Milk. Yogurt. Cream cheese. Sour cream. Fats and oils Butters. Beverages Soft drinks. Other foods Cakes and pastries. The items listed above may not be a complete list of foods and beverages to avoid. Talk with your dietitian about what choices are best for you. Summary Fiber is a type of carbohydrate. It is found in foods such as fruits, vegetables, whole grains, and beans. A  high-fiber diet has many benefits. It can help to prevent constipation, lower blood cholesterol, aid weight loss, and reduce your risk of heart disease, diabetes, and certain cancers. Increase your intake of fiber gradually. Increasing fiber too quickly may cause cramping, bloating, and gas. Drink plenty of water while you increase the amount of fiber you consume. The best sources of fiber include whole fruits and vegetables, whole grains, nuts, seeds, and beans. This information is not intended to replace advice given to you by your health care provider. Make sure you discuss any questions you have with your health care provider. Document Revised: 08/01/2019 Document Reviewed: 08/01/2019 Elsevier Patient Education  [place].

## 2022-04-21 NOTE — Progress Notes (Signed)
NEUROLOGY FOLLOW UP OFFICE NOTE  Ramces Shomaker [id number]  Assessment/Plan:   Recurrent transient left leg weakness with shaking - concern for symptomatic hypoperfusion of right ICA siphon stenosis vs simple partial seizure. 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  Advised to discuss alternative treatment with PCP  - Hgb A1c goal less than 7  - Normotensive blood pressure 4  Mediterranean diet 5  Follow up 4-5 months.  Subjective:  Eliav Mechling is a 77 year old right-handed male with CAD s/p NSTEMI/CABG, DM II and history of stroke who presents for recent stroke.  History supplemented by hospital records.  Accompanied by his wife  UPDATE: Current medications:  ASA '81mg'$  daily  4 week cardiac event monitor in July was negative for a fib.    Reported that Crestor was causing dizziness.  His PCP switched him to Lipitor but he stopped taking it because it also caused dizziness as well.  Since the last stroke in June, he has had intermittent episodes of left leg weakness - He will suddenly feel dizzy, like he may pass out.  If he stands up, his left leg will start dragging.  During an episode, after climbing into bed, his left leg will also start shaking and feels a tingling sensation.  He takes his blood pressure, which is elevated (SBP 150s, usually 110-120).  Lasts about 10 minutes.  No pain but if he puts a heating pad on the leg, it calms it down.  Having a bowel movement also helps.  Frequency varies.  May  occur every one to two months.    HISTORY: Patient had a stroke in 1980 presenting with left sided hemiparesis.  No specific cause was identified.  Still had a some residual left foot drop.   I previously saw patient in April 2021 for right arm weakness.  MRI of brain revealed subacute to chronic posterior left MCA territory infarct involving the left parietal cortex.  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  He was advised to continue Plavix.  Echocardiogram was ordered but patient never had performed and was lost to follow up.  He was admitted to [facility] on 10/06/2021 for syncope when he was outside pulling the cord to start the weed eater.  No palpitations..  He had a syncopal episode the previous day as well as a near syncopal earlier in the month which was attributed to dehydration.  CT head showed possible evolving acute left frontal infarct.  MRI of brain confirmed acute left frontal infarct as well as chronic left frontoparietal infarct.  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  2D echocardiogram showed EF 50-55% with hypokinesis of left venticular, apical inferior wall and no interatrial shunt.  LDL was 134 and Hgb A1c 6.4.  Prior to admission, he was alternating everyday between ASA '81mg'$   and Plavix.  He was discharged on DAPT for 3 months due to high-grade stenosis of left ICA siphon, followed by monotherapy o either ASA or Plavix.  He was also started on Crestor '20mg'$ .  30 day cardiac event monitor has been placed.  Feels fatigued.  No focal symptoms.    PAST MEDICAL HISTORY: Past Medical History:  Diagnosis Date   Constipation    Diabetes mellitus without complication ([place])    borderline/ pre diabetic   NSTEMI (non-ST elevated myocardial infarction) ([place])    at [place]   Stroke ([place])    L side -  40 years ago    MEDICATIONS: Current Outpatient Medications on File Prior to Visit  Medication Sig Dispense Refill   aspirin EC 81 MG tablet Take 81 mg by mouth daily. Swallow whole.     atorvastatin (LIPITOR) 40 MG tablet Take 1 tablet (40 mg total) by mouth daily. 90 tablet 1   No current facility-administered medications on file prior to visit.    ALLERGIES: No Known Allergies  FAMILY HISTORY: Family History  Problem Relation Age of Onset   Dementia Mother    Liver disease Father    Diabetes Sister    Cirrhosis Brother       Objective:  Blood pressure 121/72, pulse 65, height '5\' 5"'$  (1.651 m), weight 162 lb 12.8 oz (73.8 kg), SpO2 97 %. General: No acute distress.  Patient appears well-groomed.   Head:  Normocephalic/atraumatic Eyes:  Fundi examined but not visualized Neck: supple, no paraspinal tenderness, full range of motion Heart:  Regular rate and rhythm Lungs:  Clear to auscultation bilaterally Back: No paraspinal tenderness Neurological Exam: alert and oriented to person, place, and time.  Speech fluent and not dysarthric, language intact.  CN II-XII intact. Bulk and tone normal, muscle strength 5-/5 left hand grip, left hip flexion, knee extension, otherwise 5/5.  Sensation to pinprick reduced on left, vibratory sensation intact.  Deep tendon reflexes 2+ throughout.  Finger to nose testing intact.  Gait with slight limp.  Romberg negative.   Metta Clines, DO  CC:  Lelon Frohlich, MD

## 2022-04-25 ENCOUNTER — Ambulatory Visit: Payer: Medicare PPO | Admitting: Neurology

## 2022-04-25 ENCOUNTER — Encounter: Payer: Self-pay | Admitting: Neurology

## 2022-04-25 VITALS — BP 121/72 | HR 65 | Ht 65.0 in | Wt 162.8 lb

## 2022-04-25 DIAGNOSIS — R29898 Other symptoms and signs involving the musculoskeletal system: Secondary | ICD-10-CM

## 2022-04-25 DIAGNOSIS — E785 Hyperlipidemia, unspecified: Secondary | ICD-10-CM

## 2022-04-25 DIAGNOSIS — R7303 Prediabetes: Secondary | ICD-10-CM

## 2022-04-25 DIAGNOSIS — I63232 Cerebral infarction due to unspecified occlusion or stenosis of left carotid arteries: Secondary | ICD-10-CM

## 2022-04-25 MED ORDER — CLOPIDOGREL BISULFATE 75 MG PO TABS: 75.0000 mg | ORAL_TABLET | Freq: Every day | ORAL | 5 refills | Status: DC

## 2022-04-25 NOTE — Patient Instructions (Addendum)
Check MRI of brain. We have sent a referral to [facility] for your MRI and they will call you directly to schedule your appointment. They are located at [place]. If you need to contact them directly please call [phone].  Check CTA of head and neck Check EEG Stop aspirin.  Restart Plavix '75mg'$  daily Talk with your PCP about starting a different cholesterol medication. Follow up 4 to 5 months.

## 2022-04-27 ENCOUNTER — Encounter: Payer: Self-pay | Admitting: Internal Medicine

## 2022-04-27 ENCOUNTER — Ambulatory Visit: Payer: Medicare PPO | Admitting: Internal Medicine

## 2022-04-27 VITALS — BP 130/66 | HR 67 | Temp 97.9°F | Wt 160.7 lb

## 2022-04-27 DIAGNOSIS — I6350 Cerebral infarction due to unspecified occlusion or stenosis of unspecified cerebral artery: Secondary | ICD-10-CM

## 2022-04-27 DIAGNOSIS — K5900 Constipation, unspecified: Secondary | ICD-10-CM

## 2022-04-27 DIAGNOSIS — I2510 Atherosclerotic heart disease of native coronary artery without angina pectoris: Secondary | ICD-10-CM

## 2022-04-27 DIAGNOSIS — I739 Peripheral vascular disease, unspecified: Secondary | ICD-10-CM | POA: Diagnosis not present

## 2022-04-27 DIAGNOSIS — E785 Hyperlipidemia, unspecified: Secondary | ICD-10-CM

## 2022-04-27 DIAGNOSIS — E119 Type 2 diabetes mellitus without complications: Secondary | ICD-10-CM | POA: Insufficient documentation

## 2022-04-27 DIAGNOSIS — E1169 Type 2 diabetes mellitus with other specified complication: Secondary | ICD-10-CM

## 2022-04-27 LAB — MICROALBUMIN / CREATININE URINE RATIO
Creatinine,U: 98.5 mg/dL
Microalb Creat Ratio: 0.7 mg/g (ref 0.0–30.0)
Microalb, Ur: <0.7 mg/dL (ref 0.0–1.9)

## 2022-04-27 LAB — POCT GLYCOSYLATED HEMOGLOBIN (HGB A1C): Hemoglobin A1C: 6.4 % — AB (ref 4.0–5.6)

## 2022-04-27 NOTE — Progress Notes (Signed)
Established Patient Office Visit     CC/Reason for Visit: Follow-up chronic conditions   HPI: [name] [name] is a 77 y.o. male who is coming in today for the above mentioned reasons. Past Medical History is significant for: Hypertension, hyperlipidemia, coronary artery disease status post CABG, PAD, recurrent CVAs.  He is currently under workup with Dr. Tomi Likens, MRI and CT is pending, he was upgraded from aspirin to Plavix 2 days ago.  He was recently diagnosed as a diabetic, he saw the dietitian, has lost 4 pounds.  He has been constipated.   Past Medical/Surgical History: Past Medical History:  Diagnosis Date   Constipation    Diabetes mellitus without complication ([place])    borderline/ pre diabetic   NSTEMI (non-ST elevated myocardial infarction) ([place])    at [place]   Stroke ([place])    L side - 40 years ago    Past Surgical History:  Procedure Laterality Date   CARDIAC CATHETERIZATION     CATARACT EXTRACTION Bilateral    05/2021; 07/2021   CORONARY ARTERY BYPASS GRAFT     at [place] History:  reports that he quit smoking about 54 years ago. His smoking use included cigarettes. He has never used smokeless tobacco. He reports current alcohol use. He reports that he does not use drugs.  Allergies: No Known Allergies  Family History:  Family History  Problem Relation Age of Onset   Dementia Mother    Liver disease Father    Diabetes Sister    Cirrhosis Brother      Current Outpatient Medications:    clopidogrel (PLAVIX) 75 MG tablet, Take 1 tablet (75 mg total) by mouth daily., Disp: 30 tablet, Rfl: 5  Review of Systems:  Negative unless indicated in HPI.   Physical Exam: Vitals:   04/27/22 1324 04/27/22 1329  BP: 124/84 130/66  Pulse: 67   Temp: 97.9 F (36.6 C)   TempSrc: Oral   SpO2: 97%   Weight: 160 lb 11.2 oz (72.9 kg)     Body mass index is 26.74 kg/m.   Physical Exam Vitals reviewed.  Constitutional:      Appearance: Normal  appearance.  HENT:     Head: Normocephalic and atraumatic.  Eyes:     Conjunctiva/sclera: Conjunctivae normal.     Pupils: Pupils are equal, round, and reactive to light.  Cardiovascular:     Rate and Rhythm: Normal rate and regular rhythm.  Pulmonary:     Effort: Pulmonary effort is normal.     Breath sounds: Normal breath sounds.  Skin:    General: Skin is warm and dry.  Neurological:     General: No focal deficit present.     Mental Status: He is alert and oriented to person, place, and time.  Psychiatric:        Mood and Affect: Mood normal.        Behavior: Behavior normal.        Thought Content: Thought content normal.        Judgment: Judgment normal.      Impression and Plan:  Type 2 diabetes mellitus with other specified complication, without long-term current use of insulin (HCC) - Plan: POCT glycosylated hemoglobin (Hb A1C), Urine microalbumin-creatinine with uACR  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 (HCC)  Coronary artery disease involving native heart without angina pectoris, unspecified vessel or lesion type  Dyslipidemia, goal LDL below 70  PVD (peripheral vascular disease) ([place])  Constipation, unspecified constipation type  -A1c of 6.4 demonstrates excellent diabetic control, not currently on medication. -Blood pressure is well-controlled. -He has not been taking his statin as he believes it contributes to dizziness.  He has agreed to take 3 days a week and we will follow-up in 3 months. -Recent consultations have been reviewed in detail, MRI and CT pending for concern for recurrent CVA. -For his constipation we discussed increasing fluid intake and adding MiraLAX once to twice daily.  Time spent:31 minutes reviewing chart, interviewing and examining patient and formulating plan of care.     Lelon Frohlich, MD [facility]

## 2022-04-28 ENCOUNTER — Other Ambulatory Visit: Payer: Medicare PPO

## 2022-05-03 ENCOUNTER — Ambulatory Visit (INDEPENDENT_AMBULATORY_CARE_PROVIDER_SITE_OTHER): Payer: Medicare PPO | Admitting: Neurology

## 2022-05-03 DIAGNOSIS — R29898 Other symptoms and signs involving the musculoskeletal system: Secondary | ICD-10-CM

## 2022-05-03 DIAGNOSIS — I63232 Cerebral infarction due to unspecified occlusion or stenosis of left carotid arteries: Secondary | ICD-10-CM

## 2022-05-03 NOTE — Progress Notes (Signed)
EEG complete - results pending 

## 2022-05-03 NOTE — Procedures (Signed)
ELECTROENCEPHALOGRAM REPORT  Date of Study: 05/02/2022  Patient's Name: [name] [name] MRN: [id number] Date of Birth: 1945-10-29   Clinical History: 77 year old male with recurrent transient left leg weakness and shaking  Medications: Current Outpatient Medications on File Prior to Visit  Medication Sig Dispense Refill   clopidogrel (PLAVIX) 75 MG tablet Take 1 tablet (75 mg total) by mouth daily. 30 tablet 5   No current facility-administered medications on file prior to visit.    Technical Summary: A multichannel digital EEG recording measured by the international 10-20 system with electrodes applied with paste and impedances below 5000 ohms performed in our laboratory with EKG monitoring in an awake and asleep patient.  Hyperventilation not performed.  Photic stimulation was performed.  The digital EEG was referentially recorded, reformatted, and digitally filtered in a variety of bipolar and referential montages for optimal display.    Description: The patient is awake and asleep during the recording.  During maximal wakefulness, there is a symmetric, medium voltage 8 Hz posterior dominant rhythm that attenuates with eye opening.  The record is symmetric.  During drowsiness and sleep, there is an increase in theta slowing of the background.  Stage 2 sleep is seen.  Photic stimulation did not elicit any abnormalities.  There were no epileptiform discharges or electrographic seizures seen.    EKG lead was unremarkable.  Impression: This awake and asleep EEG is normal.    Clinical Correlation: A normal EEG does not exclude a clinical diagnosis of epilepsy.  If further clinical questions remain, prolonged EEG may be helpful.  Clinical correlation is advised.   Metta Clines, DO

## 2022-05-04 ENCOUNTER — Telehealth: Payer: Self-pay

## 2022-05-04 NOTE — Telephone Encounter (Signed)
Patient's wife is returning a call about EEG results.

## 2022-05-05 ENCOUNTER — Encounter: Payer: Self-pay | Admitting: Neurology

## 2022-05-06 ENCOUNTER — Ambulatory Visit: Payer: Medicare PPO | Admitting: Dietician

## 2022-05-14 ENCOUNTER — Other Ambulatory Visit: Payer: Medicare PPO

## 2022-05-17 ENCOUNTER — Ambulatory Visit: Payer: Medicare PPO | Admitting: Dietician

## 2022-06-03 ENCOUNTER — Encounter (HOSPITAL_COMMUNITY): Payer: Self-pay

## 2022-06-03 ENCOUNTER — Other Ambulatory Visit: Payer: Self-pay

## 2022-06-03 ENCOUNTER — Emergency Department (HOSPITAL_COMMUNITY)
Admission: EM | Admit: 2022-06-03 | Discharge: 2022-06-03 | Disposition: A | Discharge status: Home / Self Care | Payer: Medicare PPO | Attending: Emergency Medicine | Admitting: Emergency Medicine

## 2022-06-03 DIAGNOSIS — B029 Zoster without complications: Secondary | ICD-10-CM | POA: Insufficient documentation

## 2022-06-03 DIAGNOSIS — Z951 Presence of aortocoronary bypass graft: Secondary | ICD-10-CM | POA: Insufficient documentation

## 2022-06-03 DIAGNOSIS — R21 Rash and other nonspecific skin eruption: Secondary | ICD-10-CM | POA: Diagnosis present

## 2022-06-03 MED ORDER — PREDNISONE 10 MG (21) PO TBPK: ORAL_TABLET | Freq: Every day | ORAL | 0 refills | Status: DC

## 2022-06-03 MED ORDER — OXYCODONE-ACETAMINOPHEN 5-325 MG PO TABS
1.0000 | ORAL_TABLET | Freq: Once | ORAL | Status: AC
  Administered 2022-06-03: 1 via ORAL
  Filled 2022-06-03: qty 1

## 2022-06-03 MED ORDER — ACETAMINOPHEN 500 MG PO TABS: 500.0000 mg | ORAL_TABLET | Freq: Four times a day (QID) | ORAL | 0 refills | Status: AC | PRN

## 2022-06-03 MED ORDER — VALACYCLOVIR HCL 1 G PO TABS: 1000.0000 mg | ORAL_TABLET | Freq: Three times a day (TID) | ORAL | 0 refills | Status: DC

## 2022-06-03 MED ORDER — VALACYCLOVIR HCL 500 MG PO TABS
1000.0000 mg | ORAL_TABLET | Freq: Once | ORAL | Status: AC
  Administered 2022-06-03: 1000 mg via ORAL
  Filled 2022-06-03: qty 2

## 2022-06-03 MED ORDER — OXYCODONE-ACETAMINOPHEN 5-325 MG PO TABS: 1.0000 | ORAL_TABLET | Freq: Two times a day (BID) | ORAL | 0 refills | Status: DC | PRN

## 2022-06-03 NOTE — ED Triage Notes (Signed)
Pt to ED today with concerns of a rash that starts under his L chest that wraps around to his L back. Pt reports some discomfort the last few days but did not notice the rash until this morning.   Pt has not had any shingles vaccine.

## 2022-06-03 NOTE — Discharge Instructions (Addendum)
You are seen in the ER for shingles.  Please take the medications that are prescribed. You received your first dose of antiviral this morning.  Your next dose will need to be in the afternoon. Please start the prednisone today as soon as you pick it up from the pharmacy. Take Tylenol every 6 hours for pain.  Take additional pain medicine like Percocet only if the pain is severe.

## 2022-06-03 NOTE — ED Provider Notes (Signed)
[name] [name] Provider Note   CSN: [id number] Arrival date & time: 06/03/22  [id number]     History  Chief Complaint  Patient presents with   Rash    [name] [name] is a 77 y.o. male.  HPI    77 year old male comes in with chief complaint of left-sided chest pain.  Patient has history of CABG.  He indicates that over the last 2 days he has been having some chest discomfort.  He has had chest discomfort since CABG, and suspected it was to blame.  However the pain is different, described as sharp pain and burning pain.  He has been tossing and turning because of pain the last 2 nights.  This morning he noted a rash that is wrapping around his chest and back.  Patient has not received zoster/single vaccine. He denies new illness.  Home Medications Prior to Admission medications   Medication Sig Start Date End Date Taking? Authorizing Provider  acetaminophen (TYLENOL) 500 MG tablet Take 1 tablet (500 mg total) by mouth every 6 (six) hours as needed. 06/03/22  Yes Varney Biles, MD  oxyCODONE-acetaminophen (PERCOCET/ROXICET) 5-325 MG tablet Take 1 tablet by mouth every 12 (twelve) hours as needed for severe pain. 06/03/22  Yes Rula Keniston, MD  predniSONE (STERAPRED UNI-PAK 21 TAB) 10 MG (21) TBPK tablet Take by mouth daily. Take 5 tabs by mouth daily for 2 days, then 4 tabs for 2 days, then 3 tabs for 2 days, then 2 tabs for 2 days, then 1 tab by mouth daily for 2 days 06/03/22  Yes Varney Biles, MD  valACYclovir (VALTREX) 1000 MG tablet Take 1 tablet (1,000 mg total) by mouth 3 (three) times daily. 06/03/22  Yes Varney Biles, MD  clopidogrel (PLAVIX) 75 MG tablet Take 1 tablet (75 mg total) by mouth daily. 04/25/22   Pieter Partridge, DO      Allergies    Patient has no known allergies.    Review of Systems   Review of Systems  All other systems reviewed and are negative.   Physical Exam Updated Vital Signs BP 137/83   Pulse 71   Temp  (!) 97.4 F (36.3 C) (Oral)   Resp 16   Ht '5\' 5"'$  (1.651 m)   Wt 74.8 kg   SpO2 99%   BMI 27.46 kg/m  Physical Exam Vitals and nursing note reviewed.  Constitutional:      Appearance: He is well-developed.  HENT:     Head: Atraumatic.  Cardiovascular:     Rate and Rhythm: Normal rate.  Pulmonary:     Effort: Pulmonary effort is normal.  Musculoskeletal:     Cervical back: Neck supple.  Skin:    General: Skin is warm.     Findings: Erythema and rash present.  Neurological:     Mental Status: He is alert and oriented to person, place, and time.        ED Results / Procedures / Treatments   Labs (all labs ordered are listed, but only abnormal results are displayed) Labs Reviewed - No data to display  EKG None  Radiology No results found.  Procedures Procedures    Medications Ordered in ED Medications  valACYclovir (VALTREX) tablet 1,000 mg (has no administration in time range)  oxyCODONE-acetaminophen (PERCOCET/ROXICET) 5-325 MG per tablet 1 tablet (1 tablet Oral Given 06/03/22 [id number])    ED Course/ Medical Decision Making/ A&P  Medical Decision Making 77 year old male with history of CABG comes in with chief complaint of chest pain and rash.  His symptoms have been present for about 2 days now.  Today he noted rash covering his chest and going on to his back.  Differential diagnosis considered for this patient includes zoster, musculoskeletal pain, viral syndrome.  Clinically patient has zoster.  We will start him on valacyclovir and give him corticosteroids for his severe pain along with Percocet that can be used as as needed.  Patient will be given Tylenol 5 mg every 6 hours.  Diagnosis discussed.  Patient reassured, that the pain is not because of infection from his CABG wound or because of heart.  No other workup indicated at this time.  Problems Addressed: Herpes zoster without complication: acute illness or  injury  Risk OTC drugs. Prescription drug management.    Final Clinical Impression(s) / ED Diagnoses Final diagnoses:  Herpes zoster without complication    Rx / DC Orders ED Discharge Orders          Ordered    predniSONE (STERAPRED UNI-PAK 21 TAB) 10 MG (21) TBPK tablet  Daily        06/03/22 0952    valACYclovir (VALTREX) 1000 MG tablet  3 times daily        06/03/22 0956    acetaminophen (TYLENOL) 500 MG tablet  Every 6 hours PRN        06/03/22 0956    oxyCODONE-acetaminophen (PERCOCET/ROXICET) 5-325 MG tablet  Every 12 hours PRN        06/03/22 0956              Varney Biles, MD 06/03/22 1000

## 2022-06-21 ENCOUNTER — Encounter (HOSPITAL_COMMUNITY): Payer: Self-pay

## 2022-06-21 ENCOUNTER — Emergency Department (HOSPITAL_COMMUNITY): Payer: Medicare PPO

## 2022-06-21 ENCOUNTER — Emergency Department (HOSPITAL_COMMUNITY)
Admission: EM | Admit: 2022-06-21 | Discharge: 2022-06-21 | Disposition: A | Discharge status: Home / Self Care | Payer: Medicare PPO | Attending: Emergency Medicine | Admitting: Emergency Medicine

## 2022-06-21 ENCOUNTER — Other Ambulatory Visit: Payer: Self-pay

## 2022-06-21 ENCOUNTER — Ambulatory Visit: Payer: Medicare PPO | Admitting: Family Medicine

## 2022-06-21 ENCOUNTER — Encounter: Payer: Self-pay | Admitting: Family Medicine

## 2022-06-21 ENCOUNTER — Ambulatory Visit: Payer: Medicare PPO | Admitting: Internal Medicine

## 2022-06-21 VITALS — BP 104/70 | HR 63 | Temp 97.4°F | Wt 159.0 lb

## 2022-06-21 DIAGNOSIS — Z7902 Long term (current) use of antithrombotics/antiplatelets: Secondary | ICD-10-CM | POA: Diagnosis not present

## 2022-06-21 DIAGNOSIS — R0602 Shortness of breath: Secondary | ICD-10-CM | POA: Insufficient documentation

## 2022-06-21 DIAGNOSIS — R059 Cough, unspecified: Secondary | ICD-10-CM | POA: Insufficient documentation

## 2022-06-21 DIAGNOSIS — R109 Unspecified abdominal pain: Secondary | ICD-10-CM | POA: Diagnosis not present

## 2022-06-21 DIAGNOSIS — R5383 Other fatigue: Secondary | ICD-10-CM

## 2022-06-21 DIAGNOSIS — Z20822 Contact with and (suspected) exposure to covid-19: Secondary | ICD-10-CM | POA: Diagnosis not present

## 2022-06-21 DIAGNOSIS — R634 Abnormal weight loss: Secondary | ICD-10-CM

## 2022-06-21 DIAGNOSIS — I2510 Atherosclerotic heart disease of native coronary artery without angina pectoris: Secondary | ICD-10-CM | POA: Insufficient documentation

## 2022-06-21 DIAGNOSIS — R972 Elevated prostate specific antigen [PSA]: Secondary | ICD-10-CM

## 2022-06-21 LAB — RESP PANEL BY RT-PCR (RSV, FLU A&B, COVID)  RVPGX2
Influenza A by PCR: NEGATIVE
Influenza B by PCR: NEGATIVE
Resp Syncytial Virus by PCR: NEGATIVE
SARS Coronavirus 2 by RT PCR: NEGATIVE

## 2022-06-21 MED ORDER — ACETAMINOPHEN 500 MG PO TABS
1000.0000 mg | ORAL_TABLET | ORAL | Status: AC
  Administered 2022-06-21: 1000 mg via ORAL
  Filled 2022-06-21: qty 2

## 2022-06-21 NOTE — ED Triage Notes (Signed)
Per pt "I think I may have pneumonia, it feels the same as it did before, hurts right on my L side on my back". Denies increased pain with inspiration  Of note pt had shingles a few weeks ago in that same area on his back. Scant scabs are noted

## 2022-06-21 NOTE — Discharge Instructions (Signed)
You were seen for your flank pain in the emergency department.   At home, please take Tylenol and over-the-counter lidocaine patches for your pain.    Follow-up with your primary doctor in 2-3 days regarding your visit.    Return immediately to the emergency department if you experience any of the following: Fever, shortness of breath, worsening pain, or any other concerning symptoms.    Thank you for visiting our Emergency Department. It was a pleasure taking care of you today.

## 2022-06-21 NOTE — ED Provider Notes (Signed)
[facility] Provider Note   CSN: [id number] Arrival date & time: 06/21/22  [id number]     History  Chief Complaint  Patient presents with   Back Pain    [name] [name] is a 77 y.o. male.  77 year old male with history of CAD and pneumonia who presents emergency department with flank pain, cough, and shortness of breath.  Patient reports he was recently treated for shingles.  His rash is improved but says that last night he started experiencing flank pain.  Cannot exactly remember which side it was on.  Says that his rash is not hurting anymore.  Said that he had a cough and some mild shortness of breath that spontaneously improved and he is not having any more at this time.  Says he did take Tylenol for the pain.  Denies any fevers or sick contacts recently.  Is concerned about pneumonia and took an azithromycin that he had at home which he also feels improved his symptoms.  Denies any chest pain recently.       Home Medications Prior to Admission medications   Medication Sig Start Date End Date Taking? Authorizing Provider  acetaminophen (TYLENOL) 500 MG tablet Take 1 tablet (500 mg total) by mouth every 6 (six) hours as needed. 06/03/22   Varney Biles, MD  clopidogrel (PLAVIX) 75 MG tablet Take 1 tablet (75 mg total) by mouth daily. 04/25/22   Pieter Partridge, DO  oxyCODONE-acetaminophen (PERCOCET/ROXICET) 5-325 MG tablet Take 1 tablet by mouth every 12 (twelve) hours as needed for severe pain. 06/03/22   Varney Biles, MD  predniSONE (STERAPRED UNI-PAK 21 TAB) 10 MG (21) TBPK tablet Take by mouth daily. Take 5 tabs by mouth daily for 2 days, then 4 tabs for 2 days, then 3 tabs for 2 days, then 2 tabs for 2 days, then 1 tab by mouth daily for 2 days 06/03/22   Varney Biles, MD  valACYclovir (VALTREX) 1000 MG tablet Take 1 tablet (1,000 mg total) by mouth 3 (three) times daily. 06/03/22   Varney Biles, MD      Allergies    Patient has no  known allergies.    Review of Systems   Review of Systems  Physical Exam Updated Vital Signs BP 114/67   Pulse (!) 58   Temp 98.1 F (36.7 C) (Oral)   Resp 16   Ht '5\' 5"'$  (1.651 m)   Wt 74.8 kg   SpO2 96%   BMI 27.46 kg/m  Physical Exam Vitals and nursing note reviewed.  Constitutional:      General: He is not in acute distress.    Appearance: He is well-developed.  HENT:     Head: Normocephalic and atraumatic.     Right Ear: External ear normal.     Left Ear: External ear normal.     Nose: Nose normal.  Eyes:     Extraocular Movements: Extraocular movements intact.     Conjunctiva/sclera: Conjunctivae normal.     Pupils: Pupils are equal, round, and reactive to light.  Cardiovascular:     Rate and Rhythm: Normal rate and regular rhythm.     Heart sounds: Normal heart sounds.  Pulmonary:     Effort: Pulmonary effort is normal. No respiratory distress.     Breath sounds: Normal breath sounds.  Musculoskeletal:     Cervical back: Normal range of motion and neck supple.     Right lower leg: No edema.     Left  lower leg: No edema.  Skin:    General: Skin is warm and dry.  Neurological:     Mental Status: He is alert. Mental status is at baseline.  Psychiatric:        Mood and Affect: Mood normal.        Behavior: Behavior normal.    Left flank where shingles rash was   ED Results / Procedures / Treatments   Labs (all labs ordered are listed, but only abnormal results are displayed) Labs Reviewed  RESP PANEL BY RT-PCR (RSV, FLU A&B, COVID)  [id number]    EKG None  Radiology DG Chest 2 View  Result Date: 06/21/2022 CLINICAL DATA:  Cough, shortness of breath. EXAM: CHEST - 2 VIEW COMPARISON:  None Available. FINDINGS: Postoperative changes of median sternotomy and CABG. Normal heart size and mediastinal contours. Linear atelectasis or scarring in the left lung base. No pleural effusion or pneumothorax. IMPRESSION: No evidence of acute cardiopulmonary disease.  Electronically Signed   By: Emmit Alexanders M.D.   On: 06/21/2022 10:01    Procedures Procedures   Medications Ordered in ED Medications  acetaminophen (TYLENOL) tablet 1,000 mg (1,000 mg Oral Given 06/21/22 1017)    ED Course/ Medical Decision Making/ A&P                             Medical Decision Making Amount and/or Complexity of Data Reviewed Radiology: ordered.  Risk OTC drugs.   [name] [name] is a 77 y.o. male with comorbidities that complicate the patient evaluation including CAD and pneumonia with recent shingles diagnosis who presents emergency department with flank pain, cough, shortness of breath that spontaneously resolved  Initial Ddx:  Pneumonia, URI, PE, MI  MDM:  Unclear what is causing the patient's symptoms at this time.  Feel that any life-threatening pathology is unlikely given the fact that his symptoms all spontaneously resolved.  Will obtain COVID and flu and chest x-ray to evaluate for pneumonia.  Did consider pulmonary embolism but with his resolving symptoms and no chest pain feel that PE and MI are highly unlikely.  Plan:  COVID/flu Chest x-ray  ED Summary/Re-evaluation:  Chest x-ray and RVP were negative for infection.  Patient did not have recurrence of shortness of breath while in the emergency department.  Did have some pain in his back while here and was given Tylenol.  Suspect that his symptoms are likely due to musculoskeletal etiologies.  Will have him follow-up with his primary doctor in 2 to 3 days.  This patient presents to the ED for concern of complaints listed in HPI, this involves an extensive number of treatment options, and is a complaint that carries with it a high risk of complications and morbidity. Disposition including potential need for admission considered.   Dispo: DC Home. Return precautions discussed including, but not limited to, those listed in the AVS. Allowed pt time to ask questions which were answered fully prior to  dc.  Additional history obtained from spouse Records reviewed ED Visit Notes I independently reviewed the following imaging with scope of interpretation limited to determining acute life threatening conditions related to emergency care: Chest x-ray and agree with the radiologist interpretation with the following exceptions: none I personally reviewed and interpreted cardiac monitoring: normal sinus rhythm  I personally reviewed and interpreted the pt's EKG: see above for interpretation  I have reviewed the patients home medications and made adjustments as needed Social Determinants of health:  Elderly  Final Clinical Impression(s) / ED Diagnoses Final diagnoses:  Flank pain    Rx / DC Orders ED Discharge Orders     None         Fransico Meadow, MD 06/21/22 1229

## 2022-06-21 NOTE — Progress Notes (Signed)
   Subjective:    Patient ID: [name] [name], male    DOB: 1945-04-20, 77 y.o.   MRN: [id number]  HPI Here to follow up an ED visit this morning and to discuss generalized fatigue and weight loss. He presented to the ED with cough and chest pain, but this had completely resolved by the time he has evaluated. His exam and CXR were normal. A respiratory viral panel was negative. No clear diagnosis was made and he was told to follow up with his PCP, Dr. Jerilee Hoh. He is here with his wife to discuss some generalized fatigue he has had for the past 6 months. He has also lost about 12 lbs in the past 6 months with no explanation. His appetite is good and he eats as much as he ever has. He denies any bowel or urine symptoms. He has never had a colonoscopy. He had some labs drawn at the hospital a year ago, but he has not had a PSA checked for a number of years. He has diabetes which seems to be fairly well controlled. His A1c in January was 6.4%.    Review of Systems  Constitutional:  Positive for fatigue and unexpected weight change.  Respiratory: Negative.    Cardiovascular: Negative.   Gastrointestinal: Negative.   Endocrine: Negative.   Genitourinary: Negative.   Neurological: Negative.        Objective:   Physical Exam Constitutional:      Appearance: Normal appearance. He is not ill-appearing.  Cardiovascular:     Rate and Rhythm: Normal rate and regular rhythm.     Pulses: Normal pulses.     Heart sounds: Normal heart sounds.  Pulmonary:     Effort: Pulmonary effort is normal.     Breath sounds: Normal breath sounds.  Abdominal:     General: Abdomen is flat. Bowel sounds are normal. There is no distension.     Palpations: Abdomen is soft. There is no mass.     Tenderness: There is no abdominal tenderness. There is no right CVA tenderness, left CVA tenderness, guarding or rebound.     Hernia: No hernia is present.  Musculoskeletal:     Right lower leg: No edema.     Left lower leg:  No edema.  Neurological:     General: No focal deficit present.     Mental Status: He is alert and oriented to person, place, and time.           Assessment & Plan:  Fatigue and weight loss. The possible etiologies for this are numerous. We will draw some complete labs today, including a PSA. Refer to GI for a colonoscopy , and I think an upper endoscopy would also be a good idea. He will follow up with Dr. Jerilee Hoh. We spent a total of ( 35  ) minutes reviewing records and discussing these issues.  Alysia Penna, MD

## 2022-06-22 ENCOUNTER — Encounter: Payer: Self-pay | Admitting: Family Medicine

## 2022-06-22 ENCOUNTER — Encounter: Payer: Self-pay | Admitting: Physician Assistant

## 2022-06-22 DIAGNOSIS — R634 Abnormal weight loss: Secondary | ICD-10-CM

## 2022-06-22 LAB — HEPATIC FUNCTION PANEL
ALT: 31 U/L (ref 0–53)
AST: 18 U/L (ref 0–37)
Albumin: 4.1 g/dL (ref 3.5–5.2)
Alkaline Phosphatase: 63 U/L (ref 39–117)
Bilirubin, Direct: 0.1 mg/dL (ref 0.0–0.3)
Total Bilirubin: 0.8 mg/dL (ref 0.2–1.2)
Total Protein: 7.1 g/dL (ref 6.0–8.3)

## 2022-06-22 LAB — CBC WITH DIFFERENTIAL/PLATELET
Basophils Absolute: 0.1 10*3/uL (ref 0.0–0.1)
Basophils Relative: 0.8 % (ref 0.0–3.0)
Eosinophils Absolute: 0.1 10*3/uL (ref 0.0–0.7)
Eosinophils Relative: 1.1 % (ref 0.0–5.0)
HCT: 47.8 % (ref 39.0–52.0)
Hemoglobin: 16 g/dL (ref 13.0–17.0)
Lymphocytes Relative: 29.6 % (ref 12.0–46.0)
Lymphs Abs: 3.8 10*3/uL (ref 0.7–4.0)
MCHC: 33.5 g/dL (ref 30.0–36.0)
MCV: 103.2 fl — ABNORMAL HIGH (ref 78.0–100.0)
Monocytes Absolute: 0.8 10*3/uL (ref 0.1–1.0)
Monocytes Relative: 6.4 % (ref 3.0–12.0)
Neutro Abs: 8.1 10*3/uL — ABNORMAL HIGH (ref 1.4–7.7)
Neutrophils Relative %: 62.1 % (ref 43.0–77.0)
Platelets: 260 10*3/uL (ref 150.0–400.0)
RBC: 4.63 Mil/uL (ref 4.22–5.81)
RDW: 14.7 % (ref 11.5–15.5)
WBC: 13 10*3/uL — ABNORMAL HIGH (ref 4.0–10.5)

## 2022-06-22 LAB — TSH: TSH: 0.72 u[IU]/mL (ref 0.35–5.50)

## 2022-06-22 LAB — URINALYSIS
Bilirubin Urine: NEGATIVE
Hgb urine dipstick: NEGATIVE
Ketones, ur: NEGATIVE
Leukocytes,Ua: NEGATIVE
Nitrite: NEGATIVE
Specific Gravity, Urine: 1.015 (ref 1.000–1.030)
Total Protein, Urine: NEGATIVE
Urine Glucose: NEGATIVE
Urobilinogen, UA: 0.2 (ref 0.0–1.0)
pH: 7 (ref 5.0–8.0)

## 2022-06-22 LAB — VITAMIN B12: Vitamin B-12: 624 pg/mL (ref 211–911)

## 2022-06-22 LAB — BASIC METABOLIC PANEL
BUN: 17 mg/dL (ref 6–23)
CO2: 29 mEq/L (ref 19–32)
Calcium: 9.8 mg/dL (ref 8.4–10.5)
Chloride: 101 mEq/L (ref 96–112)
Creatinine, Ser: 0.83 mg/dL (ref 0.40–1.50)
GFR: 84.75 mL/min (ref >60.00)
Glucose, Bld: 149 mg/dL — ABNORMAL HIGH (ref 70–99)
Potassium: 4.3 mEq/L (ref 3.5–5.1)
Sodium: 140 mEq/L (ref 135–145)

## 2022-06-22 LAB — PSA: PSA: 87.34 ng/mL — ABNORMAL HIGH (ref 0.10–4.00)

## 2022-06-22 NOTE — Addendum Note (Signed)
Addended by: Alysia Penna A on: 06/22/2022 04:59 PM   Modules accepted: Orders

## 2022-06-22 NOTE — Telephone Encounter (Signed)
I did a new referral to Dr. Collene Mares

## 2022-06-23 ENCOUNTER — Telehealth: Payer: Self-pay | Admitting: Internal Medicine

## 2022-06-23 NOTE — Addendum Note (Signed)
Addended by: Alysia Penna A on: 06/23/2022 01:08 PM   Modules accepted: Orders

## 2022-06-23 NOTE — Telephone Encounter (Signed)
Since I have been working with this patient, I have changed my mind and I would be HAPPY to see him as his PCP. Please relay this to him

## 2022-06-23 NOTE — Addendum Note (Signed)
Addended by: Alysia Penna A on: 06/23/2022 09:31 AM   Modules accepted: Orders

## 2022-06-23 NOTE — Telephone Encounter (Signed)
I did the referral to Dr. Verner Chol and cancelled the one to Dr.Mann. I appreciate his asking me to be his PCP, but I am sorry to say I am too full right now. He should follow up with Dr. Jerilee Hoh

## 2022-06-23 NOTE — Telephone Encounter (Signed)
Done

## 2022-06-23 NOTE — Telephone Encounter (Signed)
Pt wife wanda is calling and pt would like a referral to dr Willaim Rayas at [facility] phone number is [phone]. Pt wife is aware waiting on md to put referral in

## 2022-06-24 ENCOUNTER — Telehealth: Payer: Self-pay | Admitting: Internal Medicine

## 2022-06-24 NOTE — Telephone Encounter (Signed)
Patient returning call, results

## 2022-06-24 NOTE — Telephone Encounter (Signed)
Yes I agreed to see him  

## 2022-06-24 NOTE — Telephone Encounter (Signed)
Referral has been placed.  Message sent to patient via 06/22/22 my chart message.

## 2022-06-24 NOTE — Telephone Encounter (Signed)
Pt called to request a Transfer of Care -   From: Rockney Ghee, MD - [place] at [place]   To:    Barbie Banner, MD - [place] at [facility]   Are both providers alright with this request?   Please advise.   Respectfully,  IC

## 2022-06-24 NOTE — Telephone Encounter (Signed)
Pt called to ask if MD could call him back to discuss his results.

## 2022-06-24 NOTE — Telephone Encounter (Signed)
Thank you :)

## 2022-06-27 NOTE — Telephone Encounter (Addendum)
Message sent to patient in 06/22/22, patient message concerning prostate results.

## 2022-06-27 NOTE — Telephone Encounter (Signed)
Reply agreeing to see patient sent in 06/22/22 patient message.

## 2022-06-28 ENCOUNTER — Telehealth: Payer: Self-pay | Admitting: Family Medicine

## 2022-06-28 NOTE — Telephone Encounter (Signed)
Yes cancel the appt with Dr. Jerilee Hoh and make one with me instead

## 2022-06-28 NOTE — Telephone Encounter (Signed)
Pt has a 3 month follow up appointment scheduled to see Dr. Jerilee Hoh. Pt has already been transferred to Dr. Sarajane Jews. Pt would like to know if he should re-schedule this FU with Dr. Sarajane Jews, instead.  Please advise.

## 2022-06-29 ENCOUNTER — Encounter: Payer: Self-pay | Admitting: Family Medicine

## 2022-06-29 NOTE — Telephone Encounter (Signed)
Already done

## 2022-07-07 ENCOUNTER — Telehealth: Payer: Self-pay | Admitting: Family Medicine

## 2022-07-07 NOTE — Telephone Encounter (Signed)
Referral with Dr.Goldstein is still active. It was sent on 03/14.

## 2022-07-07 NOTE — Telephone Encounter (Signed)
On 06/22/22 referral was placed to Dr. Collene Mares in Gastroenterology.    On 06/23/22 patient requested new Gastroenterology referral to be sent to Dr. Verner Chol at [facility] in [place].

## 2022-07-07 NOTE — Telephone Encounter (Signed)
[name] with dr Collene Mares office is calling and dr Collene Mares is not accepting new medicare patients

## 2022-07-07 NOTE — Telephone Encounter (Signed)
Send a message to pt via MyChart, advised to call Dr Verner Chol office and schedule appointment

## 2022-07-07 NOTE — Telephone Encounter (Signed)
My referral to Dr. Verner Chol should still be active. If not, please have the referral team work on it

## 2022-07-14 ENCOUNTER — Ambulatory Visit: Payer: Medicare PPO | Admitting: Family Medicine

## 2022-07-14 ENCOUNTER — Ambulatory Visit: Payer: Medicare PPO | Admitting: Internal Medicine

## 2022-07-14 ENCOUNTER — Encounter: Payer: Self-pay | Admitting: Family Medicine

## 2022-07-14 VITALS — BP 110/70 | HR 68 | Temp 97.9°F | Wt 157.2 lb

## 2022-07-14 DIAGNOSIS — R972 Elevated prostate specific antigen [PSA]: Secondary | ICD-10-CM | POA: Diagnosis not present

## 2022-07-14 MED ORDER — ASPIRIN 81 MG PO TBEC: 81.0000 mg | DELAYED_RELEASE_TABLET | Freq: Every day | ORAL | 0 refills | Status: DC

## 2022-07-14 NOTE — Progress Notes (Signed)
   Subjective:    Patient ID: [name] [name], male    DOB: 03-11-1946, 77 y.o.   MRN: [id number]  HPI Here with his wife to follow up on weight loss and an elevated PSA. We sw him on 06-21-22 when he described fatigue and a 12 lb weight loss over the prior 6 months. His exam (minus a rectal exam) was normal. He had recently had a normal CXR. His labs that day were unremarkable except for a PSA of 87.34. We have presumed since then that he has prostate cancer. We referred him to [facility], but he has not been contacted by them yet. He has lost another 2 lbs since our last exam. He is scheduled for a CT of the abdomen and pelvis on 07-18-22.    Review of Systems  Constitutional:  Positive for fatigue and unexpected weight change. Negative for appetite change.  Respiratory: Negative.    Cardiovascular: Negative.   Gastrointestinal: Negative.   Genitourinary: Negative.   Neurological: Negative.        Objective:   Physical Exam Constitutional:      Appearance: Normal appearance.  Cardiovascular:     Rate and Rhythm: Normal rate and regular rhythm.     Pulses: Normal pulses.     Heart sounds: Normal heart sounds.  Pulmonary:     Effort: Pulmonary effort is normal.     Breath sounds: Normal breath sounds.  Abdominal:     General: Abdomen is flat. Bowel sounds are normal. There is no distension.     Palpations: Abdomen is soft. There is no mass.     Tenderness: There is no abdominal tenderness. There is no right CVA tenderness, left CVA tenderness, guarding or rebound.     Hernia: No hernia is present.  Genitourinary:    Penis: Normal.      Testes: Normal.     Comments: The prostate is moderately enlarged, more so in the left lobe. There are several firm nodules in the left lobe. No tenderness  Neurological:     Mental Status: He is alert.           Assessment & Plan:  Likely prostate cancer. We were able to make an appt to see [facility] on 08-11-22. They will also ask  to be put on the cancellation list. The CT is scheduled as above. To maximize his nutrition, I advised him to drink 2 or 3 bottles of Ensure every day in addition to his meals.  Alysia Penna, MD

## 2022-07-18 ENCOUNTER — Ambulatory Visit
Admission: RE | Admit: 2022-07-18 | Discharge: 2022-07-18 | Disposition: A | Discharge status: Home / Self Care | Payer: Medicare PPO | Source: Ambulatory Visit | Attending: Family Medicine | Admitting: Family Medicine

## 2022-07-18 DIAGNOSIS — N402 Nodular prostate without lower urinary tract symptoms: Secondary | ICD-10-CM | POA: Diagnosis not present

## 2022-07-18 DIAGNOSIS — R972 Elevated prostate specific antigen [PSA]: Secondary | ICD-10-CM

## 2022-07-18 DIAGNOSIS — R590 Localized enlarged lymph nodes: Secondary | ICD-10-CM | POA: Diagnosis not present

## 2022-07-18 DIAGNOSIS — K753 Granulomatous hepatitis, not elsewhere classified: Secondary | ICD-10-CM | POA: Diagnosis not present

## 2022-07-18 DIAGNOSIS — N400 Enlarged prostate without lower urinary tract symptoms: Secondary | ICD-10-CM | POA: Diagnosis not present

## 2022-07-18 MED ORDER — IOPAMIDOL (ISOVUE-300) INJECTION 61%
100.0000 mL | Freq: Once | INTRAVENOUS | Status: AC | PRN
  Administered 2022-07-18: 100 mL via INTRAVENOUS

## 2022-07-21 ENCOUNTER — Telehealth: Payer: Self-pay | Admitting: Family Medicine

## 2022-07-21 NOTE — Telephone Encounter (Signed)
Pt wife is returning jo anne call

## 2022-07-21 NOTE — Telephone Encounter (Signed)
Called pt wife back phone ringing with no option to leave  message, will try again later

## 2022-07-22 ENCOUNTER — Other Ambulatory Visit: Payer: Self-pay | Admitting: Internal Medicine

## 2022-07-22 DIAGNOSIS — E785 Hyperlipidemia, unspecified: Secondary | ICD-10-CM

## 2022-07-22 NOTE — Telephone Encounter (Signed)
Patient informed-see results note. 

## 2022-08-02 ENCOUNTER — Ambulatory Visit: Payer: Medicare PPO | Admitting: Physician Assistant

## 2022-08-02 ENCOUNTER — Encounter: Payer: Self-pay | Admitting: Physician Assistant

## 2022-08-02 VITALS — BP 110/60 | HR 72 | Ht 64.0 in | Wt 158.5 lb

## 2022-08-02 DIAGNOSIS — R634 Abnormal weight loss: Secondary | ICD-10-CM | POA: Diagnosis not present

## 2022-08-02 DIAGNOSIS — Z1211 Encounter for screening for malignant neoplasm of colon: Secondary | ICD-10-CM

## 2022-08-02 DIAGNOSIS — K5900 Constipation, unspecified: Secondary | ICD-10-CM

## 2022-08-02 DIAGNOSIS — R9389 Abnormal findings on diagnostic imaging of other specified body structures: Secondary | ICD-10-CM | POA: Diagnosis not present

## 2022-08-02 MED ORDER — POLYETHYLENE GLYCOL 3350 17 GM/SCOOP PO POWD: 17.0000 g | Freq: Two times a day (BID) | ORAL | 0 refills | Status: AC

## 2022-08-02 NOTE — Progress Notes (Signed)
Chief Complaint: Constipation and to discuss a screening colonoscopy  HPI:    [name] is a 77 year old male with a past medical history of diabetes and NSTEMI as well as stroke on Plavix (10/07/2021 echo with LVEF 50-55% and grade 1 diastolic dysfunction), who was referred to me by Philip Aspen, [place]* for a complaint of constipation and to discuss a screening colonoscopy.      06/21/2022 PSA elevated 87.34.  Hepatic function panel normal, TSH normal.  CBC with a white blood cell count of 13.  Normal hemoglobin.  BMP with elevated glucose of 149 otherwise normal.    07/20/2022 CTAP with contrast done for prostate cancer suspicion.  Prostate gland is enlarged with asymmetric nodular density in the left posterior lateral aspect of the prostate which may represent prostate cancer.  Mild left retroperitoneal lymphadenopathy and 2 pulmonary nodules in the right lung base measuring up to 5 mm as well as a large amount of stool throughout the entire colon and rectum.  That time patient was told that there is suspicion for prostate cancer.  He does not have follow-up with urology until 08/11/2022.    Most recent weights 157 pounds on 4/4, 158 pounds today.    Today, the patient presents to clinic accompanied by his wife.  Initially it look like referral was made for weight loss but he tells me this is not a problem for him.  Tells me his PCP told him he needed screening for colorectal cancer as he has never had this done.  Patient is aware of workup with suspicion for prostate cancer and his wife tells me he has an appointment with urology on 08/11/2022.  As far as GI symptoms patient does describe some constipation with occasional straining and periodically some small pebble-like stools.  Apparently they have tried MiraLAX in the past once daily and did not feel like it helped very much.  Even tried occasional Ex-Lax which did not seem to help.  Not sure how consistent they were with this.  He denies any other GI  complaints.    Denies prior colon cancer screening.    Denies fever, chills, nausea, vomiting, heartburn, reflux, abdominal pain or blood in the stool.  Past Medical History:  Diagnosis Date   Constipation    Diabetes mellitus without complication    borderline/ pre diabetic   NSTEMI (non-ST elevated myocardial infarction)    at [place]   Stroke    L side - 40 years ago    Past Surgical History:  Procedure Laterality Date   CARDIAC CATHETERIZATION     CATARACT EXTRACTION Bilateral    05/2021; 07/2021   CORONARY ARTERY BYPASS GRAFT     at [facility]    Current Outpatient Medications  Medication Sig Dispense Refill   acetaminophen (TYLENOL) 500 MG tablet Take 1 tablet (500 mg total) by mouth every 6 (six) hours as needed. 30 tablet 0   aspirin EC 81 MG tablet Take 1 tablet (81 mg total) by mouth daily. Swallow whole. 30 tablet 0   clopidogrel (PLAVIX) 75 MG tablet Take 1 tablet (75 mg total) by mouth daily. 30 tablet 5   No current facility-administered medications for this visit.    Allergies as of 08/02/2022   (No Known Allergies)    Family History  Problem Relation Age of Onset   Dementia Mother    Liver disease Father    Diabetes Sister    Cirrhosis Brother     Social History  Socioeconomic History   Marital status: Married    Spouse name: Not on file   Number of children: 2   Years of education: Not on file   Highest education level: Not on file  Occupational History   Not on file  Tobacco Use   Smoking status: Former    Types: Cigarettes    Quit date: 04/11/1968    Years since quitting: 54.3   Smokeless tobacco: Never   Tobacco comments:    quit in the 1970s  Vaping Use   Vaping Use: Never used  Substance and Sexual Activity   Alcohol use: Yes    Comment: Rare   Drug use: No   Sexual activity: Not on file  Other Topics Concern   Not on file  Social History Narrative   Right handed   One story home   Drinks no caffeine   Social Determinants  of Health   Financial Resource Strain: Not on file  Food Insecurity: Not on file  Transportation Needs: Not on file  Physical Activity: Not on file  Stress: Not on file  Social Connections: Not on file  Intimate Partner Violence: Not on file    Review of Systems:    Constitutional: No fever or chills Skin: No rash  Cardiovascular: No chest pain Respiratory: No SOB  Gastrointestinal: See HPI and otherwise negative Genitourinary: No dysuria  Neurological: No headache, dizziness or syncope Musculoskeletal: No new muscle or joint pain Hematologic: No bleeding  Psychiatric: No history of depression or anxiety   Physical Exam:  Vital signs: BP 110/60 (BP Location: Left Arm, Patient Position: Sitting, Cuff Size: Normal)   Pulse 72   Ht  (1.626 m) Comment: height measured without shoes  Wt 158 lb 8 oz (71.9 kg)   BMI 27.21 kg/m    Constitutional:   Pleasant AA male appears to be in NAD, Well developed, Well nourished, alert and cooperative Head:  Normocephalic and atraumatic. Eyes:   PEERL, EOMI. No icterus. Conjunctiva pink. Ears:  Normal auditory acuity. Neck:  Supple Throat: Oral cavity and pharynx without inflammation, swelling or lesion.  Respiratory: Respirations even and unlabored. Lungs clear to auscultation bilaterally.   No wheezes, crackles, or rhonchi.  Cardiovascular: Normal S1, S2. No MRG. Regular rate and rhythm. No peripheral edema, cyanosis or pallor.  Gastrointestinal:  Soft, nondistended, nontender. No rebound or guarding. Normal bowel sounds. No appreciable masses or hepatomegaly. Rectal:  Not performed.  Msk:  Symmetrical without gross deformities. Without edema, no deformity or joint abnormality.  Neurologic:  Alert and  oriented x4;  grossly normal neurologically.  Skin:   Dry and intact without significant lesions or rashes. Psychiatric: Demonstrates good judgement and reason without abnormal affect or behaviors.  RELEVANT LABS AND IMAGING: CBC     Component Value Date/Time   WBC 13.0 (H) 06/21/2022 1619   RBC 4.63 06/21/2022 1619   HGB 16.0 06/21/2022 1619   HCT 47.8 06/21/2022 1619   PLT 260.0 06/21/2022 1619   MCV 103.2 (H) 06/21/2022 1619   MCH 33.6 10/07/2021 0412   MCHC 33.5 06/21/2022 1619   RDW 14.7 06/21/2022 1619   LYMPHSABS 3.8 06/21/2022 1619   MONOABS 0.8 06/21/2022 1619   EOSABS 0.1 06/21/2022 1619   BASOSABS 0.1 06/21/2022 1619    CMP     Component Value Date/Time   NA 140 06/21/2022 1619   K 4.3 06/21/2022 1619   CL 101 06/21/2022 1619   CO2 29 06/21/2022 1619   GLUCOSE 149 (  H) 06/21/2022 1619   BUN 17 06/21/2022 1619   CREATININE 0.83 06/21/2022 1619   CALCIUM 9.8 06/21/2022 1619   PROT 7.1 06/21/2022 1619   PROT 7.0 11/15/2016 0942   ALBUMIN 4.1 06/21/2022 1619   ALBUMIN 4.3 11/15/2016 0942   AST 18 06/21/2022 1619   ALT 31 06/21/2022 1619   ALKPHOS 63 06/21/2022 1619   BILITOT 0.8 06/21/2022 1619   BILITOT 0.4 11/15/2016 0942   GFRNONAA >60 10/07/2021 0412    Assessment: 1.  Screening for colorectal cancer: Apparently patient has never had screening, no family history of colon cancer  2.  Weight loss?  Patient is unaware of this, looks like he has actually gained a pound over the past month, if he truly is losing weight could be related to below 3.  Abnormal prostate via imaging with elevated PSA: Patient has urology consult 08/11/2022, very concerning for prostate cancer 4.  Constipation: Chronic for the patient; likely slow transit  Plan: 1.  Discussed with patient that typically would do screening for colorectal cancer starting at the age of 17, apparently he has never had screening via any modality per him.  Right now most worrying to me is his abnormal prostate and elevated PSA with question of prostate cancer and some enlarged lymph nodes in his abdomen.  He does have scheduled office visit with urology 08/11/2022 to discuss this further.  Explained that they will likely want to do a biopsy.   He is on Plavix which complicates the picture a little. 2.  At this point discussed other options for colon cancer screening with the patient.  I did discuss a colonoscopy versus Cologuard and patient would prefer to proceed with Cologuard first.  Did explain that if this is positive then we will need to rediscuss a colonoscopy and he will need to hold his Plavix for 5 days prior to time of procedure.  He will also likely need a 2-day bowel prep given his history of constipation. 3.  Patient was advised to start MiraLAX twice daily for his constipation titrate up to 4 times a day as needed. 4.  Patient will follow in clinic with me in 2 months.  At that time hopefully we can review urology's notes and he will have a plan for his abnormal prostate and we will have results from his Cologuard.  Assigned to Dr. Meridee Score this morning.  Hyacinth Meeker, PA-C [facility] 08/02/2022, 9:52 AM  Cc: Philip Aspen, [place]*

## 2022-08-02 NOTE — Progress Notes (Signed)
Attending Physician's Attestation   I have reviewed the chart.   I agree with the Advanced Practitioner's note, impression, and recommendations with any updates as below. If Cologuard returns abnormal before clinic follow-up, then we will plan to set up next available colonoscopy.   Corliss Parish, MD [facility] Advanced Endoscopy Office # [phone]

## 2022-08-02 NOTE — Patient Instructions (Addendum)
Please purchase the following medications over the counter and take as directed: Miralax: Take twice a day  Your provider has ordered Cologuard testing as an option for colon cancer screening. This is performed by [employer] [facility] and may be out of network with your insurance. PRIOR to completing the test, it is YOUR responsibility to contact your insurance about covered benefits for this test. Your out of pocket expense could be anywhere from $0.00 to $649.00.   When you call to check coverage with your insurer, please provide the following information:   -The ONLY provider of Cologuard is [facility], dispensing  - CPT code for Cologuard is [phone].  [facility] NPI # [id number]  -[employer] Tax ID # [id number]   We have already sent your demographic and insurance information to [facility] (phone number [phone]) and they should contact you within the next week regarding your test. If you have not heard from them within the next week, please call our office at [phone].  We have scheduled you for a follow up appointment on 10/03/2022 at 11:00am.   I appreciate the opportunity to care for you. Hyacinth Meeker, PA-C

## 2022-08-08 DIAGNOSIS — Z1211 Encounter for screening for malignant neoplasm of colon: Secondary | ICD-10-CM | POA: Diagnosis not present

## 2022-08-10 ENCOUNTER — Encounter: Payer: Self-pay | Admitting: Podiatry

## 2022-08-10 ENCOUNTER — Ambulatory Visit: Payer: Medicare PPO | Admitting: Podiatry

## 2022-08-10 DIAGNOSIS — M79676 Pain in unspecified toe(s): Secondary | ICD-10-CM

## 2022-08-10 DIAGNOSIS — B351 Tinea unguium: Secondary | ICD-10-CM

## 2022-08-10 NOTE — Progress Notes (Signed)
He presents today chief complaint of painful elongated toenails and a small bump on the side of his right foot.  States that he is going tomorrow for prostate biopsies and has recently had the shingles.  Objective: Vital signs are stable alert oriented x 3.  Pulses are palpable.  Small palpable nodule to the lateral aspect of the base of the fifth metatarsal which appears to be a ganglion cyst that is nontender.  He also has thick yellow dystrophic clinically mycotic nails which are painful palpation as well as debridement.  Assessment: Pain limb secondary to onychomycosis.  Plan: Debridement of toenails 1 through 5 bilaterally.

## 2022-08-11 DIAGNOSIS — R972 Elevated prostate specific antigen [PSA]: Secondary | ICD-10-CM | POA: Diagnosis not present

## 2022-08-11 DIAGNOSIS — C778 Secondary and unspecified malignant neoplasm of lymph nodes of multiple regions: Secondary | ICD-10-CM | POA: Diagnosis not present

## 2022-08-11 DIAGNOSIS — R3912 Poor urinary stream: Secondary | ICD-10-CM | POA: Diagnosis not present

## 2022-08-11 DIAGNOSIS — N401 Enlarged prostate with lower urinary tract symptoms: Secondary | ICD-10-CM | POA: Diagnosis not present

## 2022-08-15 ENCOUNTER — Telehealth: Payer: Self-pay | Admitting: Family Medicine

## 2022-08-15 NOTE — Telephone Encounter (Signed)
Contacted [name] [name] to schedule their annual wellness visit. Appointment made for 08/19/22.  [name] [name] AWV direct phone # [phone]

## 2022-08-15 NOTE — Telephone Encounter (Signed)
Called patient to schedule Medicare Annual Wellness Visit (AWV). Left message for patient to call back and schedule Medicare Annual Wellness Visit (AWV).  Last date of AWV: awvi 11/10/11 per [facility]  Please schedule an appointment at any time with [facility] or [facility].  If any questions, please contact me at [phone].  Thank you ,  Rudell Cobb AWV direct phone # [phone]

## 2022-08-16 ENCOUNTER — Telehealth: Payer: Self-pay | Admitting: Cardiology

## 2022-08-16 NOTE — Telephone Encounter (Signed)
Patient's wife called saying patient needs clearance to hold his Plavix ASAP so he can get his biopsy done as his PSA level has gone up.

## 2022-08-16 NOTE — Telephone Encounter (Signed)
   Pre-operative Risk Assessment    Patient Name: Chaim Swaringen  DOB: 02-04-1946 MRN: [id number]      Request for Surgical Clearance    Procedure:   TRUS Biopsy   Date of Surgery:  Clearance TBD                                 Surgeon:  Dr. Glenford Peers Group or Practice Name:  [facility] Phone number:  [phone] Fax number:  [phone]   Type of Clearance Requested:   - Medical  - Pharmacy:  Hold Clopidogrel (Plavix) 5 days before    Type of Anesthesia:  None    Additional requests/questions:      SignedFilomena Jungling   08/16/2022, 11:01 AM

## 2022-08-16 NOTE — Telephone Encounter (Signed)
   Name: [name] [name]  DOB: 12-Jul-1945  MRN: [id number]  Primary Cardiologist: Olga Millers, MD   Preoperative team, please contact this patient and set up a phone call appointment for further preoperative risk assessment. Please obtain consent and complete medication review. Thank you for your help.  I confirm that guidance regarding antiplatelet and oral anticoagulation therapy has been completed and, if necessary, noted below.  Per office protocol, he may hold Plavix for 5 days prior to procedure and should resume as soon as hemodynamically stable postoperatively.    [name] Levering, NP 08/16/2022, 3:40 PM [facility]

## 2022-08-17 ENCOUNTER — Telehealth: Payer: Self-pay | Admitting: Family Medicine

## 2022-08-17 ENCOUNTER — Telehealth: Payer: Self-pay | Admitting: *Deleted

## 2022-08-17 LAB — COLOGUARD: COLOGUARD: NEGATIVE

## 2022-08-17 NOTE — Telephone Encounter (Signed)
Wife returned Pre-op's call. 

## 2022-08-17 NOTE — Telephone Encounter (Signed)
Left message on machine for pt to contact the office to schedule telephone clearance appt.  

## 2022-08-17 NOTE — Telephone Encounter (Signed)
Contacted Jordy Daza to schedule their annual wellness visit. Appointment made for 09/07/22.  Rudell Cobb AWV direct phone # [phone]   Spouse called to r/s 5/10 appt r/s to 5/29

## 2022-08-17 NOTE — Telephone Encounter (Signed)
S/w pt is scheduled for Friday, May 10.     Patient Consent for Virtual Visit         Zae Flom has provided verbal consent on 08/17/2022 for a virtual visit (video or telephone).   CONSENT FOR VIRTUAL VISIT FOR:  [name] [name]  By participating in this virtual visit I agree to the following:  I hereby voluntarily request, consent and authorize [facility] and its employed or contracted physicians, physician assistants, nurse practitioners or other licensed health care professionals (the Practitioner), to provide me with telemedicine health care services (the "Services") as deemed necessary by the treating Practitioner. I acknowledge and consent to receive the Services by the Practitioner via telemedicine. I understand that the telemedicine visit will involve communicating with the Practitioner through live audiovisual communication technology and the disclosure of certain medical information by electronic transmission. I acknowledge that I have been given the opportunity to request an in-person assessment or other available alternative prior to the telemedicine visit and am voluntarily participating in the telemedicine visit.  I understand that I have the right to withhold or withdraw my consent to the use of telemedicine in the course of my care at any time, without affecting my right to future care or treatment, and that the Practitioner or I may terminate the telemedicine visit at any time. I understand that I have the right to inspect all information obtained and/or recorded in the course of the telemedicine visit and may receive copies of available information for a reasonable fee.  I understand that some of the potential risks of receiving the Services via telemedicine include:  Delay or interruption in medical evaluation due to technological equipment failure or disruption; Information transmitted may not be sufficient (e.g. poor resolution of images) to allow for appropriate  medical decision making by the Practitioner; and/or  In rare instances, security protocols could fail, causing a breach of personal health information.  Furthermore, I acknowledge that it is my responsibility to provide information about my medical history, conditions and care that is complete and accurate to the best of my ability. I acknowledge that Practitioner's advice, recommendations, and/or decision may be based on factors not within their control, such as incomplete or inaccurate data provided by me or distortions of diagnostic images or specimens that may result from electronic transmissions. I understand that the practice of medicine is not an exact science and that Practitioner makes no warranties or guarantees regarding treatment outcomes. I acknowledge that a copy of this consent can be made available to me via my patient portal [facility] MyChart), or I can request a printed copy by calling the office of  [facility].    I understand that my insurance will be billed for this visit.   I have read or had this consent read to me. I understand the contents of this consent, which adequately explains the benefits and risks of the Services being provided via telemedicine.  I have been provided ample opportunity to ask questions regarding this consent and the Services and have had my questions answered to my satisfaction. I give my informed consent for the services to be provided through the use of telemedicine in my medical care

## 2022-08-18 ENCOUNTER — Ambulatory Visit: Payer: Medicare PPO | Admitting: Podiatry

## 2022-08-19 ENCOUNTER — Ambulatory Visit: Payer: Medicare PPO

## 2022-08-19 ENCOUNTER — Ambulatory Visit: Payer: Medicare PPO | Attending: Family Medicine

## 2022-08-19 DIAGNOSIS — Z01810 Encounter for preprocedural cardiovascular examination: Secondary | ICD-10-CM | POA: Diagnosis not present

## 2022-08-19 NOTE — Progress Notes (Signed)
Virtual Visit via Telephone Note   Because of [name] [name]'s co-morbid illnesses, he is at least at moderate risk for complications without adequate follow up.  This format is felt to be most appropriate for this patient at this time.  The patient did not have access to video technology/had technical difficulties with video requiring transitioning to audio format only (telephone).  All issues noted in this document were discussed and addressed.  No physical exam could be performed with this format.  Please refer to the patient's chart for his consent to telehealth for [facility].  Evaluation Performed:  Preoperative cardiovascular risk assessment _____________   Date:  08/19/2022   Patient ID:  [name], [name] 10-Jul-1945, MRN [id number] Patient Location:  Home Provider location:   Office  Primary Care Provider:  Nelwyn Salisbury, MD Primary Cardiologist:  [name] Millers, MD  Chief Complaint / Patient Profile   77 y.o. y/o male with a h/o CVA x 4, coronary artery disease status post CABG in 2017, peripheral artery disease, and prediabetes who is pending TRUS and presents today for telephonic preoperative cardiovascular risk assessment.  History of Present Illness    [name] [name] is a 77 y.o. male who presents via audio/video conferencing for a telehealth visit today.  Pt was last seen in cardiology clinic on 03/22/2022 by [name] Fabian, NP.  At that time [name] [name] was doing well other than intermittent episodes of dizziness.  The patient is now pending procedure as outlined above. Since his last visit, he tells me he has had no issues with his heart.  Denies shortness of breath, chest pain, lower extremity edema.  He walks more than 1-2 blocks. He is not an in the house person, he likes being outside. He walks 1-2 miles a day likely. Sometimes his ankles and legs get tired and he takes a break.   5 day hold okay for plavix hold from [facility]. He takes plavix for  stroke mainly and this is prescribed by [name] [name]. Guidance should come from him as well. He has had a total of 4 strokes.   Past Medical History    Past Medical History:  Diagnosis Date   CAD (coronary artery disease)    Constipation    Diabetes mellitus without complication (HCC)    borderline/ pre diabetic   NSTEMI (non-ST elevated myocardial infarction) (HCC)    at [place]   Stroke (HCC)    L side - 40 years ago   Past Surgical History:  Procedure Laterality Date   CARDIAC CATHETERIZATION     CATARACT EXTRACTION Bilateral    05/2021; 07/2021   CORONARY ARTERY BYPASS GRAFT     at [facility]    Allergies  Not on File  Home Medications    Prior to Admission medications   Medication Sig Start Date End Date Taking? Authorizing Provider  acetaminophen (TYLENOL) 500 MG tablet Take 1 tablet (500 mg total) by mouth every 6 (six) hours as needed. 06/03/22   [name] Kaplan, MD  aspirin EC 81 MG tablet Take 1 tablet (81 mg total) by mouth daily. Swallow whole. Patient taking differently: Take 81 mg by mouth as needed. Swallow whole. 07/14/22   [name] Salisbury, MD  calcium carbonate (TUMS EX) 750 MG chewable tablet Chew 0.5 tablets by mouth daily.    [provider]  clopidogrel (PLAVIX) 75 MG tablet Take 1 tablet (75 mg total) by mouth daily. 04/25/22   [name] Dallas, DO  polyethylene glycol powder (  GLYCOLAX/MIRALAX) 17 GM/SCOOP powder Take 17 g by mouth in the morning and at bedtime. 08/02/22   [name] Lightning, PA    Physical Exam    Vital Signs:  [name] [name] does not have vital signs available for review today.  110/70  Given telephonic nature of communication, physical exam is limited. AAOx3. NAD. Normal affect.  Speech and respirations are unlabored.  Accessory Clinical Findings    None  Assessment & Plan    1.  Preoperative Cardiovascular Risk Assessment:  Mr. Hearst's perioperative risk of a major cardiac event is 6.6% according to the Revised Cardiac  Risk Index (RCRI).  Therefore, he is at high risk for perioperative complications.   His functional capacity is good at 5.29 METs according to the Duke Activity Status Index (DASI). Recommendations: According to ACC/AHA guidelines, no further cardiovascular testing needed.  The patient may proceed to surgery at acceptable risk.   Antiplatelet and/or Anticoagulation Recommendations: Clopidogrel (Plavix) can be held for 5 days prior to his surgery and resumed as soon as possible post op.* Guidance should also come from Neuro given multiple strokes.   A copy of this note will be routed to requesting surgeon.  Time:   Today, I have spent 15 minutes with the patient with telehealth technology discussing medical history, symptoms, and management plan.     Sharlene Dory, PA-C  08/19/2022, 11:29 AM

## 2022-08-22 ENCOUNTER — Telehealth: Payer: Self-pay | Admitting: Cardiovascular Disease

## 2022-08-22 NOTE — Telephone Encounter (Signed)
Wife stated she is following up on having information faxed to [facility].  Fax# [phone].

## 2022-08-22 NOTE — Telephone Encounter (Signed)
I s/w the pt's wife who states [facility] said they did not receive clearance from our office yet. I assured her that Jari Favre, [facility] fax her notes giving clearance on 08/19/22 @ 11:29 am.   I did also send into MY CHART for the pt as well as I did re-fax to Dr. Jettie Pagan for the pt today. Pt's wife thanked me for the help.

## 2022-08-23 NOTE — Telephone Encounter (Signed)
Pt calling in today to request refill on Eliquis and drug store change.  Medication and chart updated.

## 2022-09-07 ENCOUNTER — Telehealth: Payer: Self-pay

## 2022-09-07 ENCOUNTER — Telehealth: Payer: Self-pay | Admitting: Neurology

## 2022-09-07 NOTE — Telephone Encounter (Signed)
[facility] sent over a surgical clearance for this pt on 08/29/22. They have not received it back. They are sending another one in case that one was not received.

## 2022-09-07 NOTE — Progress Notes (Unsigned)
Subjective:   [name] [name] is a 77 y.o. male who presents for Medicare Annual/Subsequent preventive examination.  Review of Systems    ***       Objective:    There were no vitals filed for this visit. There is no height or weight on file to calculate BMI.     06/21/2022    8:53 AM 06/03/2022    8:05 AM 04/25/2022   10:36 AM 02/04/2022   10:39 AM 10/26/2021   11:09 AM 10/26/2021   10:04 AM 10/21/2021   11:21 AM  Advanced Directives  Does Patient Have a Medical Advance Directive? Yes Yes Yes Yes Yes Yes Yes  Type of Special educational needs teacher of [employer] Power of [place];Living will  Healthcare Power of [employer];Living will Healthcare Power of [place];Living will Healthcare Power of [place];Living will  Does patient want to make changes to medical advance directive?    No - Patient declined   No - Patient declined  Copy of Healthcare Power of Attorney in Chart? No - copy requested No - copy requested     No - copy requested  Would patient like information on creating a medical advance directive?      No - Patient declined No - Patient declined    Current Medications (verified) Outpatient Encounter Medications as of 09/07/2022  Medication Sig   acetaminophen (TYLENOL) 500 MG tablet Take 1 tablet (500 mg total) by mouth every 6 (six) hours as needed.   aspirin EC 81 MG tablet Take 1 tablet (81 mg total) by mouth daily. Swallow whole. (Patient taking differently: Take 81 mg by mouth as needed. Swallow whole.)   calcium carbonate (TUMS EX) 750 MG chewable tablet Chew 0.5 tablets by mouth daily.   clopidogrel (PLAVIX) 75 MG tablet Take 1 tablet (75 mg total) by mouth daily.   polyethylene glycol powder (GLYCOLAX/MIRALAX) 17 GM/SCOOP powder Take 17 g by mouth in the morning and at bedtime.   No facility-administered encounter medications on file as of 09/07/2022.    Allergies (verified) Patient has no allergy information on record.   History: Past Medical  History:  Diagnosis Date   CAD (coronary artery disease)    Constipation    Diabetes mellitus without complication (HCC)    borderline/ pre diabetic   NSTEMI (non-ST elevated myocardial infarction) (HCC)    at [place]   Stroke (HCC)    L side - 40 years ago   Past Surgical History:  Procedure Laterality Date   CARDIAC CATHETERIZATION     CATARACT EXTRACTION Bilateral    05/2021; 07/2021   CORONARY ARTERY BYPASS GRAFT     at [facility] History  Problem Relation Age of Onset   Dementia Mother        lived to 101 years   Liver disease Father    Diabetes Sister    Cirrhosis Brother    Cancer - Colon Neg Hx    Colon polyps Neg Hx    Rectal cancer Neg Hx    Esophageal cancer Neg Hx    Stomach cancer Neg Hx    Pancreatic cancer Neg Hx    Social History   Socioeconomic History   Marital status: Married    Spouse name: Not on file   Number of children: 2   Years of education: Not on file   Highest education level: Not on file  Occupational History   Occupation: retired  Tobacco Use   Smoking status: Former  Types: Cigarettes    Quit date: 04/11/1968    Years since quitting: 54.4   Smokeless tobacco: Never   Tobacco comments:    quit in the 1970s  Vaping Use   Vaping Use: Never used  Substance and Sexual Activity   Alcohol use: Not Currently    Comment: Rare   Drug use: No   Sexual activity: Not on file  Other Topics Concern   Not on file  Social History Narrative   Right handed   One story home   Drinks no caffeine   Social Determinants of Health   Financial Resource Strain: Not on file  Food Insecurity: Not on file  Transportation Needs: Not on file  Physical Activity: Not on file  Stress: Not on file  Social Connections: Not on file    Tobacco Counseling Counseling given: Not Answered Tobacco comments: quit in the 1970s   Clinical Intake:                 Diabetic?***         Activities of Daily Living    10/21/2021    11:19 AM 10/07/2021    2:49 AM  In your present state of health, do you have any difficulty performing the following activities:  Hearing? 0 0  Vision? 1 0  Difficulty concentrating or making decisions? 0 0  Walking or climbing stairs? 0 0  Dressing or bathing? 0 0  Doing errands, shopping? 1   Comment S/p CVA     Patient Care Team: Nelwyn Salisbury, MD as PCP - General (Family Medicine) Jens Som Madolyn Frieze, MD as PCP - Cardiology (Cardiology) Drema Dallas, DO as Consulting Physician (Neurology)  Indicate any recent Medical Services you may have received from other than [facility] providers in the past year (date may be approximate).     Assessment:   This is a routine wellness examination for [name].  Hearing/Vision screen No results found.  Dietary issues and exercise activities discussed:     Goals Addressed   None    Depression Screen    04/27/2022    2:26 PM 04/27/2022    1:39 PM 02/04/2022   10:38 AM 01/25/2022    1:41 PM 10/21/2021   12:23 PM 07/27/2016    9:25 AM 06/25/2016    1:49 PM  PHQ 2/9 Scores  PHQ - 2 Score 1 1 2  0 0 0 0  PHQ- 9 Score 7 7  0 2      Fall Risk    04/27/2022    2:25 PM 04/27/2022    2:02 PM 04/25/2022   10:36 AM 02/04/2022   10:38 AM 01/25/2022    1:41 PM  Fall Risk   Falls in the past year? 0 0 0 1 1  Number falls in past yr: 0 0 0 0 0  Injury with Fall? 0 0 0  1  Risk for fall due to : No Fall Risks No Fall Risks   Impaired balance/gait;Other (Comment)  Risk for fall due to: Comment     stroke  Follow up Falls evaluation completed Falls evaluation completed Falls evaluation completed  Falls evaluation completed    FALL RISK PREVENTION PERTAINING TO THE HOME:  Any stairs in or around the home? {YES/NO:21197} If so, are there any without handrails? {YES/NO:21197} Home free of loose throw rugs in walkways, pet beds, electrical cords, etc? {YES/NO:21197} Adequate lighting in your home to reduce risk of falls? {YES/NO:21197}  ASSISTIVE  DEVICES UTILIZED TO  PREVENT FALLS:  Life alert? {YES/NO:21197} Use of a cane, walker or w/c? {YES/NO:21197} Grab bars in the bathroom? {YES/NO:21197} Shower chair or bench in shower? {YES/NO:21197} Elevated toilet seat or a handicapped toilet? {YES/NO:21197}  TIMED UP AND GO:  Was the test performed? {YES/NO:21197}.  Length of time to ambulate 10 feet: *** sec.   {Appearance of ZOXW:9604540}  Cognitive Function:        Immunizations Immunization History  Administered Date(s) Administered   Moderna Sars-Covid-2 Vaccination 07/25/2019   Tdap 11/22/2020    {TDAP status:2101805}  {Flu Vaccine status:2101806}  {Pneumococcal vaccine status:2101807}  {Covid-19 vaccine status:2101808}  Qualifies for Shingles Vaccine? {YES/NO:21197}  Zostavax completed {YES/NO:21197}  {Shingrix Completed?:2101804}  Screening Tests Health Maintenance  Topic Date Due   Medicare Annual Wellness (AWV)  Never done   FOOT EXAM  Never done   Zoster Vaccines- Shingrix (1 of 2) Never done   COVID-19 Vaccine (2 - Moderna risk series) 08/22/2019   OPHTHALMOLOGY EXAM  05/12/2022   Hepatitis C Screening  11/23/2022 (Originally 07/08/1963)   Pneumonia Vaccine 86+ Years old (1 of 1 - PCV) 01/26/2023 (Originally 07/08/2010)   HEMOGLOBIN A1C  10/26/2022   INFLUENZA VACCINE  11/10/2022   Diabetic kidney evaluation - Urine ACR  04/28/2023   Diabetic kidney evaluation - eGFR measurement  06/21/2023   DTaP/Tdap/Td (2 - Td or Tdap) 11/23/2030   HPV VACCINES  Aged Out   Colonoscopy  Discontinued    Health Maintenance  Health Maintenance Due  Topic Date Due   Medicare Annual Wellness (AWV)  Never done   FOOT EXAM  Never done   Zoster Vaccines- Shingrix (1 of 2) Never done   COVID-19 Vaccine (2 - Moderna risk series) 08/22/2019   OPHTHALMOLOGY EXAM  05/12/2022    {Colorectal cancer screening:2101809}  Lung Cancer Screening: (Low Dose CT Chest recommended if Age 69-80 years, 30 pack-year currently  smoking OR have quit w/in 15years.) {DOES NOT does:27190::"does not"} qualify.   Lung Cancer Screening Referral: ***  Additional Screening:  Hepatitis C Screening: {DOES NOT does:27190::"does not"} qualify; Completed ***  Vision Screening: Recommended annual ophthalmology exams for early detection of glaucoma and other disorders of the eye. Is the patient up to date with their annual eye exam?  {YES/NO:21197} Who is the provider or what is the name of the office in which the patient attends annual eye exams? *** If pt is not established with a provider, would they like to be referred to a provider to establish care? {YES/NO:21197}.   Dental Screening: Recommended annual dental exams for proper oral hygiene  Community Resource Referral / Chronic Care Management: CRR required this visit?  {YES/NO:21197}  CCM required this visit?  {YES/NO:21197}     Plan:     I have personally reviewed and noted the following in the patient's chart:   Medical and social history Use of alcohol, tobacco or illicit drugs  Current medications and supplements including opioid prescriptions. {Opioid [phone]} Functional ability and status Nutritional status Physical activity Advanced directives List of other physicians Hospitalizations, surgeries, and ER visits in previous 12 months Vitals Screenings to include cognitive, depression, and falls Referrals and appointments  In addition, I have reviewed and discussed with patient certain preventive protocols, quality metrics, and best practice recommendations. A written personalized care plan for preventive services as well as general preventive health recommendations were provided to patient.     Tillie Rung, LPN   9/81/1914   Nurse Notes: ***

## 2022-09-07 NOTE — Telephone Encounter (Signed)
Unsuccessful attempt to reach patient on preferred number listed in notes for scheduled AWV. Left message on voicemail okay to reschedule. 

## 2022-09-09 ENCOUNTER — Encounter: Payer: Self-pay | Admitting: Neurology

## 2022-09-09 NOTE — Telephone Encounter (Signed)
[facility] advised, Letter is ready an faxed over.

## 2022-09-20 ENCOUNTER — Ambulatory Visit: Payer: Medicare PPO | Admitting: Neurology

## 2022-09-26 DIAGNOSIS — R972 Elevated prostate specific antigen [PSA]: Secondary | ICD-10-CM | POA: Diagnosis not present

## 2022-09-26 DIAGNOSIS — C61 Malignant neoplasm of prostate: Secondary | ICD-10-CM | POA: Diagnosis not present

## 2022-09-30 NOTE — Progress Notes (Unsigned)
NEUROLOGY FOLLOW UP OFFICE NOTE  [name] [name] [id number]  Assessment/Plan:   Recurrent transient left leg weakness with shaking - Unclear etiology - may be symptomatic hypoperfusion of right ICA siphon stenosis vs simple partial seizure. 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  Advised to discuss alternative treatment with PCP  - Hgb A1c goal less than 7  - Normotensive blood pressure 3. ***  Subjective:  [name] [name] is a 77 year old right-handed male with CAD s/p NSTEMI/CABG, DM II and history of stroke who presents for recent stroke.  History supplemented by hospital records.  Accompanied by his wife  UPDATE: Current medications:  Plavix 75mg  daily   EEG performed on 05/03/2022 to evaluate episodic leg shaking which was normal.  He did not have the MRI of brain and CTA head and neck performed.  ***    HISTORY: Patient had a stroke in 1980 presenting with left sided hemiparesis.  No specific cause was identified.  Still had a some residual left foot drop.   I previously saw patient in April 2021 for right arm weakness.  MRI of brain revealed subacute to chronic posterior left MCA territory infarct involving the left parietal cortex.  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  He was advised to continue Plavix.  Echocardiogram was ordered but patient never had performed and was lost to follow up.  He was admitted to [facility] on 10/06/2021 for syncope when he was outside pulling the cord to start the weed  eater.  No palpitations..  He had a syncopal episode the previous day as well as a near syncopal earlier in the month which was attributed to dehydration.  CT head showed possible evolving acute left frontal infarct.  MRI of brain confirmed acute left frontal infarct as well as chronic left frontoparietal infarct.  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  2D echocardiogram showed EF 50-55% with hypokinesis of left venticular, apical inferior wall and no interatrial shunt.  LDL was 134 and Hgb A1c 6.4.  Prior to admission, he was alternating everyday between ASA 81mg  and Plavix.  He was discharged on DAPT for 3 months due to high-grade stenosis of left ICA siphon, followed by monotherapy o either ASA or Plavix.  He was also started on Crestor 20mg .  4 week cardiac event monitor in July 2023 was negative for a fib.   Since the last stroke in June, he has had intermittent episodes of left leg weakness - He will suddenly feel dizzy, like he may pass out.  If he stands up, his left leg will start dragging.  During an episode, after climbing into bed, his left leg will also start shaking and feels a tingling sensation.  He takes his blood pressure, which is elevated (SBP 150s, usually 110-120).  Lasts about 10 minutes.  No pain but if he puts a heating pad on the leg, it calms it down.  Having a bowel movement also helps.  Frequency varies.  May occur every one to two months.   Statins:  Crestor and Lipitor both caused dizziness  PAST MEDICAL HISTORY: Past Medical History:  Diagnosis Date   Constipation    Diabetes mellitus without complication (HCC)    borderline/ pre diabetic   NSTEMI (non-ST elevated myocardial infarction) (HCC)    at [place]   Stroke (HCC)    L side - 40 years ago    MEDICATIONS: Current Outpatient Medications on File Prior to Visit  Medication Sig Dispense Refill   aspirin EC  81 MG tablet Take 81 mg by mouth daily. Swallow whole.     atorvastatin (LIPITOR) 40 MG tablet Take 1 tablet (40 mg total) by mouth daily. 90 tablet 1   No current facility-administered medications on file prior to visit.    ALLERGIES: No Known Allergies  FAMILY HISTORY: Family History  Problem Relation Age of Onset   Dementia Mother    Liver disease Father    Diabetes Sister    Cirrhosis Brother       Objective:  ***. General: No acute distress.  Patient appears well-groomed.   Head:  Normocephalic/atraumatic Eyes:  Fundi examined but not visualized Neck: supple, no paraspinal tenderness, full range of motion Heart:  Regular rate and rhythm Lungs:  Clear to auscultation bilaterally Back: No paraspinal tenderness Neurological Exam: alert and oriented to person, place, and time.  Speech fluent and not dysarthric, language intact.  CN II-XII intact. Bulk and tone normal, muscle strength 5-/5 left hand grip, left hip flexion, knee extension, otherwise 5/5.  Sensation to pinprick reduced on left, vibratory sensation intact.  Deep tendon reflexes 2+ throughout.  Finger to nose testing intact.  Gait with slight limp.  Romberg negative.   Shon Millet, DO  CC:  Chaya Jan, MD

## 2022-10-03 ENCOUNTER — Encounter: Payer: Self-pay | Admitting: Neurology

## 2022-10-03 ENCOUNTER — Ambulatory Visit: Payer: Medicare PPO | Admitting: Neurology

## 2022-10-03 ENCOUNTER — Encounter: Payer: Self-pay | Admitting: Physician Assistant

## 2022-10-03 ENCOUNTER — Ambulatory Visit: Payer: Medicare PPO | Admitting: Physician Assistant

## 2022-10-03 VITALS — BP 100/60 | HR 64 | Ht 64.0 in | Wt 156.0 lb

## 2022-10-03 VITALS — BP 120/66 | HR 61 | Ht 65.0 in | Wt 156.8 lb

## 2022-10-03 DIAGNOSIS — K5900 Constipation, unspecified: Secondary | ICD-10-CM | POA: Diagnosis not present

## 2022-10-03 DIAGNOSIS — E785 Hyperlipidemia, unspecified: Secondary | ICD-10-CM | POA: Diagnosis not present

## 2022-10-03 DIAGNOSIS — I63232 Cerebral infarction due to unspecified occlusion or stenosis of left carotid arteries: Secondary | ICD-10-CM | POA: Diagnosis not present

## 2022-10-03 NOTE — Patient Instructions (Signed)
Follow up as needed.  _______________________________________________________  If your blood pressure at your visit was 140/90 or greater, please contact your primary care physician to follow up on this.  _______________________________________________________  If you are age 77 or older, your body mass index should be between 23-30. Your Body mass index is 26.78 kg/m. If this is out of the aforementioned range listed, please consider follow up with your Primary Care Provider.  If you are age 25 or younger, your body mass index should be between 19-25. Your Body mass index is 26.78 kg/m. If this is out of the aformentioned range listed, please consider follow up with your Primary Care Provider.   ________________________________________________________  The [facility] providers would like to encourage you to use [facility] to communicate with providers for non-urgent requests or questions.  Due to long hold times on the telephone, sending your provider a message by [facility] may be a faster and more efficient way to get a response.  Please allow 48 business hours for a response.  Please remember that this is for non-urgent requests.  _______________________________________________________

## 2022-10-03 NOTE — Patient Instructions (Addendum)
Continue Plavix daily and atorvastatin Mediterranean diet (see below) Routine exercise    Mediterranean Diet A Mediterranean diet refers to food and lifestyle choices that are based on the traditions of countries located on the [employer]. It focuses on eating more fruits, vegetables, whole grains, beans, nuts, seeds, and heart-healthy fats, and eating less dairy, meat, eggs, and processed foods with added sugar, salt, and fat. This way of eating has been shown to help prevent certain conditions and improve outcomes for people who have chronic diseases, like kidney disease and heart disease. What are tips for following this plan? Reading food labels Check the serving size of packaged foods. For foods such as rice and pasta, the serving size refers to the amount of cooked product, not dry. Check the total fat in packaged foods. Avoid foods that have saturated fat or trans fats. Check the ingredient list for added sugars, such as corn syrup. Shopping  Buy a variety of foods that offer a balanced diet, including: Fresh fruits and vegetables (produce). Grains, beans, nuts, and seeds. Some of these may be available in unpackaged forms or large amounts (in bulk). Fresh seafood. Poultry and eggs. Low-fat dairy products. Buy whole ingredients instead of prepackaged foods. Buy fresh fruits and vegetables in-season from local farmers markets. Buy plain frozen fruits and vegetables. If you do not have access to quality fresh seafood, buy precooked frozen shrimp or canned fish, such as tuna, salmon, or sardines. Stock your pantry so you always have certain foods on hand, such as olive oil, canned tuna, canned tomatoes, rice, pasta, and beans. Cooking Cook foods with extra-virgin olive oil instead of using butter or other vegetable oils. Have meat as a side dish, and have vegetables or grains as your main dish. This means having meat in small portions or adding small amounts of meat to foods  like pasta or stew. Use beans or vegetables instead of meat in common dishes like chili or lasagna. Experiment with different cooking methods. Try roasting, broiling, steaming, and sauting vegetables. Add frozen vegetables to soups, stews, pasta, or rice. Add nuts or seeds for added healthy fats and plant protein at each meal. You can add these to yogurt, salads, or vegetable dishes. Marinate fish or vegetables using olive oil, lemon juice, garlic, and fresh herbs. Meal planning Plan to eat one vegetarian meal one day each week. Try to work up to two vegetarian meals, if possible. Eat seafood two or more times a week. Have healthy snacks readily available, such as: Vegetable sticks with hummus. Greek yogurt. Fruit and nut trail mix. Eat balanced meals throughout the week. This includes: Fruit: 2-3 servings a day. Vegetables: 4-5 servings a day. Low-fat dairy: 2 servings a day. Fish, poultry, or lean meat: 1 serving a day. Beans and legumes: 2 or more servings a week. Nuts and seeds: 1-2 servings a day. Whole grains: 6-8 servings a day. Extra-virgin olive oil: 3-4 servings a day. Limit red meat and sweets to only a few servings a month. Lifestyle  Cook and eat meals together with your family, when possible. Drink enough fluid to keep your urine pale yellow. Be physically active every day. This includes: Aerobic exercise like running or swimming. Leisure activities like gardening, walking, or housework. Get 7-8 hours of sleep each night. If recommended by your health care provider, drink red wine in moderation. This means 1 glass a day for nonpregnant women and 2 glasses a day for men. A glass of wine equals 5 oz (150  mL). What foods should I eat? Fruits Apples. Apricots. Avocado. Berries. Bananas. Cherries. Dates. Figs. Grapes. Lemons. Melon. Oranges. Peaches. Plums. Pomegranate. Vegetables Artichokes. Beets. Broccoli. Cabbage. Carrots. Eggplant. Green beans. Chard. Kale. Spinach.  Onions. Leeks. Peas. Squash. Tomatoes. Peppers. Radishes. Grains Whole-grain pasta. Brown rice. Bulgur wheat. Polenta. Couscous. Whole-wheat bread. Orpah Cobb. Meats and other proteins Beans. Almonds. Sunflower seeds. Pine nuts. Peanuts. Cod. Salmon. Scallops. Shrimp. Tuna. Tilapia. Clams. Oysters. Eggs. Poultry without skin. Dairy Low-fat milk. Cheese. Greek yogurt. Fats and oils Extra-virgin olive oil. Avocado oil. Grapeseed oil. Beverages Water. Red wine. Herbal tea. Sweets and desserts Greek yogurt with honey. Baked apples. Poached pears. Trail mix. Seasonings and condiments Basil. Cilantro. Coriander. Cumin. Mint. Parsley. Sage. Rosemary. Tarragon. Garlic. Oregano. Thyme. Pepper. Balsamic vinegar. Tahini. Hummus. Tomato sauce. Olives. Mushrooms. The items listed above may not be a complete list of foods and beverages you can eat. Contact a dietitian for more information. What foods should I limit? This is a list of foods that should be eaten rarely or only on special occasions. Fruits Fruit canned in syrup. Vegetables Deep-fried potatoes (french fries). Grains Prepackaged pasta or rice dishes. Prepackaged cereal with added sugar. Prepackaged snacks with added sugar. Meats and other proteins Beef. Pork. Lamb. Poultry with skin. Hot dogs. Tomasa Blase. Dairy Ice cream. Sour cream. Whole milk. Fats and oils Butter. Canola oil. Vegetable oil. Beef fat (tallow). Lard. Beverages Juice. Sugar-sweetened soft drinks. Beer. Liquor and spirits. Sweets and desserts Cookies. Cakes. Pies. Candy. Seasonings and condiments Mayonnaise. Pre-made sauces and marinades. The items listed above may not be a complete list of foods and beverages you should limit. Contact a dietitian for more information. Summary The Mediterranean diet includes both food and lifestyle choices. Eat a variety of fresh fruits and vegetables, beans, nuts, seeds, and whole grains. Limit the amount of red meat and sweets  that you eat. If recommended by your health care provider, drink red wine in moderation. This means 1 glass a day for nonpregnant women and 2 glasses a day for men. A glass of wine equals 5 oz (150 mL). This information is not intended to replace advice given to you by your health care provider. Make sure you discuss any questions you have with your health care provider. Document Revised: 05/03/2019 Document Reviewed: 02/28/2019 Elsevier Patient Education  2023 [employer].

## 2022-10-03 NOTE — Progress Notes (Signed)
Chief Complaint: Follow-up constipation  HPI:    [name] [name] is a 77 year old male, assigned to Dr. Meridee Score, with a past medical history of diabetes and NSTEMI as well as stroke on Plavix (10/07/2021 echo with LVEF 50-55% and grade 1 diastolic dysfunction), who returns to clinic today for follow-up of constipation.    06/21/2022 PSA elevated 87.34.  Hepatic function panel normal, TSH normal.  CBC with a white blood cell count of 13.  Normal hemoglobin.  BMP with elevated glucose of 149 otherwise normal.    07/20/2022 CTAP with contrast done for prostate cancer suspicion.  Prostate gland is enlarged with asymmetric nodular density in the left posterior lateral aspect of the prostate which may represent prostate cancer.  Mild left retroperitoneal lymphadenopathy and 2 pulmonary nodules in the right lung base measuring up to 5 mm as well as a large amount of stool throughout the entire colon and rectum.  That time patient was told that there is suspicion for prostate cancer.  He does not have follow-up with urology until 08/11/2022.    Most recent weights 157 pounds on 4/4, 158 pounds today.    08/02/2022 office visit with me to discuss screening for colon cancer.  At that time also suspicion of prostate cancer and he had follow-up with urology on 08/11/2022.  He describes some constipation.   At that time recommended a Cologuard.  Also told to start MiraLAX twice daily.  We set a follow-up for 2 months to review urology's notes in regards to his abnormal prostate and discussed results from Cologuard.    08/08/2022 Cologuard negative.    Today, the patient returns to clinic accompanied by his wife.  He explains that he is doing well.  He followed with urology who did a biopsy of his prostate and they are waiting on results from this.  He tells me that initially he had lost about 10 pounds which is why he came over here, but he has not lost any more weight and they think that may have been related to his prostate.   Does tell me that he seems to manage his constipation well with occasional MiraLAX and/or eating a leafy green salad or eating chocolate Ripple ice cream.  All of these things tend to help him go when he is having trouble.  He has no real complaints today.    Denies fever, chills, continued weight loss, abdominal pain or change in bowel habits.  Past Medical History:  Diagnosis Date   CAD (coronary artery disease)    Constipation    Diabetes mellitus without complication (HCC)    borderline/ pre diabetic   NSTEMI (non-ST elevated myocardial infarction) (HCC)    at [place]   Stroke [facility])    L side - 40 years ago    Past Surgical History:  Procedure Laterality Date   CARDIAC CATHETERIZATION     CATARACT EXTRACTION Bilateral    05/2021; 07/2021   CORONARY ARTERY BYPASS GRAFT     at [facility]    Current Outpatient Medications  Medication Sig Dispense Refill   acetaminophen (TYLENOL) 500 MG tablet Take 1 tablet (500 mg total) by mouth every 6 (six) hours as needed. 30 tablet 0   aspirin EC 81 MG tablet Take 1 tablet (81 mg total) by mouth daily. Swallow whole. (Patient taking differently: Take 81 mg by mouth as needed. Swallow whole.) 30 tablet 0   calcium carbonate (TUMS EX) 750 MG chewable tablet Chew 0.5 tablets by mouth daily.  clopidogrel (PLAVIX) 75 MG tablet Take 1 tablet (75 mg total) by mouth daily. 30 tablet 5   polyethylene glycol powder (GLYCOLAX/MIRALAX) 17 GM/SCOOP powder Take 17 g by mouth in the morning and at bedtime. 17 g 0   No current facility-administered medications for this visit.    Allergies as of 10/03/2022   (Not on File)    Family History  Problem Relation Age of Onset   Dementia Mother        lived to 101 years   Liver disease Father    Diabetes Sister    Cirrhosis Brother    Cancer - Colon Neg Hx    Colon polyps Neg Hx    Rectal cancer Neg Hx    Esophageal cancer Neg Hx    Stomach cancer Neg Hx    Pancreatic cancer Neg Hx     Social  History   Socioeconomic History   Marital status: Married    Spouse name: Not on file   Number of children: 2   Years of education: Not on file   Highest education level: Not on file  Occupational History   Occupation: retired  Tobacco Use   Smoking status: Former    Types: Cigarettes    Quit date: 04/11/1968    Years since quitting: 54.5   Smokeless tobacco: Never   Tobacco comments:    quit in the 1970s  Vaping Use   Vaping Use: Never used  Substance and Sexual Activity   Alcohol use: Not Currently    Comment: Rare   Drug use: No   Sexual activity: Not on file  Other Topics Concern   Not on file  Social History Narrative   Right handed   One story home   Drinks no caffeine   Social Determinants of Health   Financial Resource Strain: Not on file  Food Insecurity: Not on file  Transportation Needs: Not on file  Physical Activity: Not on file  Stress: Not on file  Social Connections: Not on file  Intimate Partner Violence: Not on file    Review of Systems:    Constitutional: No weight loss, fever or chills Cardiovascular: No chest pain Respiratory: No SOB  Gastrointestinal: See HPI and otherwise negative   Physical Exam:  Vital signs: BP 100/60 (BP Location: Left Arm, Patient Position: Sitting, Cuff Size: Normal)   Pulse 64   Ht 5\' 4"  (1.626 m)   Wt 156 lb (70.8 kg)   BMI 26.78 kg/m    Constitutional:   Pleasant AA male appears to be in NAD, Well developed, Well nourished, alert and cooperative Respiratory: Respirations even and unlabored. Lungs clear to auscultation bilaterally.   No wheezes, crackles, or rhonchi.  Cardiovascular: Normal S1, S2. No MRG. Regular rate and rhythm. No peripheral edema, cyanosis or pallor.  Gastrointestinal:  Soft, nondistended, nontender. No rebound or guarding. Normal bowel sounds. No appreciable masses or hepatomegaly. Psychiatric: Oriented to person, place and time. Demonstrates good judgement and reason without abnormal  affect or behaviors.  RELEVANT LABS AND IMAGING: CBC    Component Value Date/Time   WBC 13.0 (H) 06/21/2022 1619   RBC 4.63 06/21/2022 1619   HGB 16.0 06/21/2022 1619   HCT 47.8 06/21/2022 1619   PLT 260.0 06/21/2022 1619   MCV 103.2 (H) 06/21/2022 1619   MCH 33.6 10/07/2021 0412   MCHC 33.5 06/21/2022 1619   RDW 14.7 06/21/2022 1619   LYMPHSABS 3.8 06/21/2022 1619   MONOABS 0.8 06/21/2022 1619   EOSABS  0.1 06/21/2022 1619   BASOSABS 0.1 06/21/2022 1619    CMP     Component Value Date/Time   NA 140 06/21/2022 1619   K 4.3 06/21/2022 1619   CL 101 06/21/2022 1619   CO2 29 06/21/2022 1619   GLUCOSE 149 (H) 06/21/2022 1619   BUN 17 06/21/2022 1619   CREATININE 0.83 06/21/2022 1619   CALCIUM 9.8 06/21/2022 1619   PROT 7.1 06/21/2022 1619   PROT 7.0 11/15/2016 0942   ALBUMIN 4.1 06/21/2022 1619   ALBUMIN 4.3 11/15/2016 0942   AST 18 06/21/2022 1619   ALT 31 06/21/2022 1619   ALKPHOS 63 06/21/2022 1619   BILITOT 0.8 06/21/2022 1619   BILITOT 0.4 11/15/2016 0942   GFRNONAA >60 10/07/2021 0412    Assessment: 1.  Cologuard: Cologuard returned negative, discussed with patient that he will likely not need a screening colonoscopy given his age 52.  Constipation: Patient manages with diet  Plan: 1.  Discussed with patient there are no plans for colonoscopy at this point given his age and recently negative Cologuard.  If he starts to lose weight again and they do not think it is related to his possible prostate cancer or he is other GI issues then we may need to discuss in the future but for now we are not going to schedule one 2.  Continue diet modifications for occasional constipation. 3.  Patient to follow in clinic with [place] in the future as needed.  Hyacinth Meeker, PA-C [facility] 10/03/2022, 11:00 AM  Cc: Nelwyn Salisbury, MD

## 2022-10-05 ENCOUNTER — Other Ambulatory Visit (HOSPITAL_COMMUNITY): Payer: Self-pay | Admitting: Urology

## 2022-10-05 DIAGNOSIS — C61 Malignant neoplasm of prostate: Secondary | ICD-10-CM

## 2022-10-10 DIAGNOSIS — N401 Enlarged prostate with lower urinary tract symptoms: Secondary | ICD-10-CM | POA: Diagnosis not present

## 2022-10-10 DIAGNOSIS — C778 Secondary and unspecified malignant neoplasm of lymph nodes of multiple regions: Secondary | ICD-10-CM | POA: Diagnosis not present

## 2022-10-10 DIAGNOSIS — R3912 Poor urinary stream: Secondary | ICD-10-CM | POA: Diagnosis not present

## 2022-10-10 DIAGNOSIS — C61 Malignant neoplasm of prostate: Secondary | ICD-10-CM | POA: Diagnosis not present

## 2022-10-11 DIAGNOSIS — C61 Malignant neoplasm of prostate: Secondary | ICD-10-CM | POA: Diagnosis not present

## 2022-10-11 DIAGNOSIS — Z5111 Encounter for antineoplastic chemotherapy: Secondary | ICD-10-CM | POA: Diagnosis not present

## 2022-10-14 ENCOUNTER — Ambulatory Visit: Payer: Medicare PPO | Admitting: Family Medicine

## 2022-10-14 ENCOUNTER — Encounter: Payer: Self-pay | Admitting: Family Medicine

## 2022-10-14 VITALS — BP 112/64 | HR 59 | Temp 97.9°F | Wt 157.2 lb

## 2022-10-14 DIAGNOSIS — C61 Malignant neoplasm of prostate: Secondary | ICD-10-CM | POA: Diagnosis not present

## 2022-10-14 DIAGNOSIS — K5900 Constipation, unspecified: Secondary | ICD-10-CM | POA: Diagnosis not present

## 2022-10-14 DIAGNOSIS — F32A Depression, unspecified: Secondary | ICD-10-CM | POA: Insufficient documentation

## 2022-10-14 DIAGNOSIS — I639 Cerebral infarction, unspecified: Secondary | ICD-10-CM

## 2022-10-14 NOTE — Progress Notes (Signed)
   Subjective:    Patient ID: [name] [name], male    DOB: July 09, 1945, 77 y.o.   MRN: [id number]  HPI Here with his wife to follow up after his diagnosis of prostate cancer. We referred him to Dr. Jettie Pagan for an elevated PSA and an enlarged prostate with nodules on a CT scan. On 09-27-22 he had biopsies performed, an apparently these were positive for "advanced cancer" (we do not have notes from Dr. Cardell Peach after the biopsy). He was given 2 shots in the abdomen, and he was prescribed 2 medications, but [name] does not know the names of these. He is scheduled for a PET scan on 10-21-22, and definitive therapies will be decided based on the results. In general he feels well. He had lost about 18 pounds before we saw him in March, and we advised him to start using Ensure shakes. He takes one shake a day, and his weight has stablized. He says his appetite is still poor. He had been constipated, but he started using Miralax twice a day, and now his BM's are regular. He sees Dr. Everlena Cooper for a hx of stroke, and a few months ago he was having spells where his left leg would shake uncontrollably. Dr. 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 He still has episodes of mild weakness in the left leg, but no more shaking. Also he describes feelings of mild depression since his cancer was diagnosed. He feels down sometimes, but he says these feelings never last very long. He sleeps well.    Review of Systems  Constitutional: Negative.   Respiratory: Negative.    Cardiovascular: Negative.   Gastrointestinal: Negative.   Genitourinary: Negative.   Neurological:  Positive for weakness. Negative for dizziness, tremors, seizures, facial asymmetry, speech difficulty, numbness and headaches.       Objective:   Physical Exam Constitutional:      Appearance: Normal appearance.  Cardiovascular:     Rate and Rhythm: Normal rate and regular rhythm.      Pulses: Normal pulses.     Heart sounds: Normal heart sounds.  Pulmonary:     Effort: Pulmonary effort is normal.     Breath sounds: Normal breath sounds.  Neurological:     Mental Status: He is alert. Mental status is at baseline.  Psychiatric:        Mood and Affect: Mood normal.        Behavior: Behavior normal.        Thought Content: Thought content normal.           Assessment & Plan:  He is dealing with prostate cancer, and he will have the PET scan next week. Based on the results, Dr. Cardell Peach will determine the best treatment plan. I urged him to maximize his nutrition, so he will now take two Ensure shakes a day. His constipation has responded to taking Miralax. His neurologic status has stabilized after the strokes. He admits to some mild depression, but he says this does not need to be treated as yet.  Gershon Crane, MD

## 2022-10-18 ENCOUNTER — Encounter: Payer: Self-pay | Admitting: Family Medicine

## 2022-10-18 ENCOUNTER — Encounter: Payer: Self-pay | Admitting: Cardiology

## 2022-10-18 DIAGNOSIS — C61 Malignant neoplasm of prostate: Secondary | ICD-10-CM | POA: Diagnosis not present

## 2022-10-19 MED ORDER — PREDNISONE 5 MG PO TABS: 5.0000 mg | ORAL_TABLET | Freq: Every day | ORAL | 0 refills | Status: DC

## 2022-10-19 MED ORDER — ABIRATERONE ACETATE 250 MG PO TABS: 1000.0000 mg | ORAL_TABLET | Freq: Every day | ORAL | 0 refills | Status: AC

## 2022-10-19 MED ORDER — ELIGARD 45 MG ~~LOC~~ KIT: 45.0000 mg | PACK | SUBCUTANEOUS | 0 refills | Status: DC

## 2022-10-19 NOTE — Telephone Encounter (Signed)
I added these to our med list

## 2022-10-19 NOTE — Telephone Encounter (Signed)
fyi

## 2022-10-21 ENCOUNTER — Encounter: Payer: Self-pay | Admitting: Family Medicine

## 2022-10-21 ENCOUNTER — Encounter (HOSPITAL_COMMUNITY)
Admission: RE | Admit: 2022-10-21 | Discharge: 2022-10-21 | Disposition: A | Discharge status: Home / Self Care | Payer: Medicare PPO | Source: Ambulatory Visit | Attending: Urology | Admitting: Urology

## 2022-10-21 DIAGNOSIS — M2170 Unequal limb length (acquired), unspecified site: Secondary | ICD-10-CM

## 2022-10-21 DIAGNOSIS — C61 Malignant neoplasm of prostate: Secondary | ICD-10-CM | POA: Diagnosis not present

## 2022-10-21 MED ORDER — PIFLIFOLASTAT F 18 (PYLARIFY) INJECTION
9.0000 | Freq: Once | INTRAVENOUS | Status: AC
  Administered 2022-10-21: 8.17 via INTRAVENOUS

## 2022-10-21 NOTE — Telephone Encounter (Signed)
I did the referral 

## 2022-11-09 DIAGNOSIS — C61 Malignant neoplasm of prostate: Secondary | ICD-10-CM | POA: Diagnosis not present

## 2022-11-15 DIAGNOSIS — C778 Secondary and unspecified malignant neoplasm of lymph nodes of multiple regions: Secondary | ICD-10-CM | POA: Diagnosis not present

## 2022-11-15 DIAGNOSIS — C61 Malignant neoplasm of prostate: Secondary | ICD-10-CM | POA: Diagnosis not present

## 2022-11-15 DIAGNOSIS — C7951 Secondary malignant neoplasm of bone: Secondary | ICD-10-CM | POA: Diagnosis not present

## 2022-11-15 DIAGNOSIS — R3912 Poor urinary stream: Secondary | ICD-10-CM | POA: Diagnosis not present

## 2022-11-15 NOTE — Progress Notes (Unsigned)
[facility] [phone]   [phone]  INITIAL CONSULT NOTE  Patient Care Team: Nelwyn Salisbury, MD as PCP - General (Family Medicine) Jens Som Madolyn Frieze, MD as PCP - Cardiology (Cardiology) Drema Dallas, DO as Consulting Physician (Neurology)  Hematological/Oncological History # Metastatic Prostate Cancer  11/16/2022: establish care with Dr. Leonides Schanz   CHIEF COMPLAINTS/PURPOSE OF CONSULTATION:  "Metastatic Prostate Cancer "  HISTORY OF PRESENTING ILLNESS:  [name] [name] 77 y.o. male with medical history significant for ***  On review of the previous records ***  On exam today ***  MEDICAL HISTORY:  Past Medical History:  Diagnosis Date   CAD (coronary artery disease)    Constipation    Diabetes mellitus without complication (HCC)    borderline/ pre diabetic   NSTEMI (non-ST elevated myocardial infarction) (HCC)    at [place]   Prostate cancer (HCC)    Stroke (HCC)    L side - 40 years ago    SURGICAL HISTORY: Past Surgical History:  Procedure Laterality Date   CARDIAC CATHETERIZATION     CATARACT EXTRACTION Bilateral    05/2021; 07/2021   CORONARY ARTERY BYPASS GRAFT     at [facility]    SOCIAL HISTORY: Social History   Socioeconomic History   Marital status: Married    Spouse name: Not on file   Number of children: 2   Years of education: Not on file   Highest education level: Master's degree (e.g., MA, MS, MEng, MEd, MSW, MBA)  Occupational History   Occupation: retired  Tobacco Use   Smoking status: Former    Current packs/day: 0.00    Types: Cigarettes    Quit date: 04/11/1968    Years since quitting: 54.6   Smokeless tobacco: Never   Tobacco comments:    quit in the 1970s  Vaping Use   Vaping status: Never Used  Substance and Sexual Activity   Alcohol use: Not Currently    Comment: Rare   Drug use: No   Sexual activity: Not on file  Other Topics Concern   Not on file  Social History Narrative   Right handed    One story home   Drinks no caffeine   Social Determinants of Health   Financial Resource Strain: Low Risk  (10/11/2022)   Overall Financial Resource Strain (CARDIA)    Difficulty of Paying Living Expenses: Not hard at all  Food Insecurity: No Food Insecurity (10/11/2022)   Hunger Vital Sign    Worried About Running Out of Food in the Last Year: Never true    Ran Out of Food in the Last Year: Never true  Transportation Needs: No Transportation Needs (10/11/2022)   PRAPARE - Administrator, Civil Service (Medical): No    Lack of Transportation (Non-Medical): No  Physical Activity: Unknown (10/11/2022)   Exercise Vital Sign    Days of Exercise per Week: 5 days    Minutes of Exercise per Session: Patient declined  Stress: No Stress Concern Present (10/11/2022)   [employer] of Occupational Health - Occupational Stress Questionnaire    Feeling of Stress : Only a little  Social Connections: Unknown (10/11/2022)   Social Connection and Isolation Panel [NHANES]    Frequency of Communication with Friends and Family: More than three times a week    Frequency of Social Gatherings with Friends and Family: Patient declined    Attends Religious Services: Patient declined    Database administrator or Organizations: No  Attends Engineer, structural: Not on file    Marital Status: Married  Catering manager Violence: Not on file    FAMILY HISTORY: Family History  Problem Relation Age of Onset   Dementia Mother        lived to 101 years   Liver disease Father    Diabetes Sister    Cirrhosis Brother    Cancer - Colon Neg Hx    Colon polyps Neg Hx    Rectal cancer Neg Hx    Esophageal cancer Neg Hx    Stomach cancer Neg Hx    Pancreatic cancer Neg Hx     ALLERGIES:  has No Known Allergies.  MEDICATIONS:  Current Outpatient Medications  Medication Sig Dispense Refill   abiraterone acetate (ZYTIGA) 250 MG tablet Take 4 tablets (1,000 mg total) by mouth daily. Take  on an empty stomach 1 hour before or 2 hours after a meal 4 tablet 0   acetaminophen (TYLENOL) 500 MG tablet Take 1 tablet (500 mg total) by mouth every 6 (six) hours as needed. 30 tablet 0   aspirin EC 81 MG tablet Take 1 tablet (81 mg total) by mouth daily. Swallow whole. (Patient taking differently: Take 81 mg by mouth as needed. Swallow whole.) 30 tablet 0   calcium carbonate (TUMS EX) 750 MG chewable tablet Chew 0.5 tablets by mouth daily.     clopidogrel (PLAVIX) 75 MG tablet Take 1 tablet (75 mg total) by mouth daily. 30 tablet 5   leuprolide, 6 Month, (LEUPROLIDE ACETATE, 6 MONTH,) 45 MG injection Inject 45 mg into the skin every 30 (thirty) days. 1 each 0   polyethylene glycol powder (GLYCOLAX/MIRALAX) 17 GM/SCOOP powder Take 17 g by mouth in the morning and at bedtime. 17 g 0   predniSONE (DELTASONE) 5 MG tablet Take 1 tablet (5 mg total) by mouth daily with breakfast. 1 tablet 0   No current facility-administered medications for this visit.    REVIEW OF SYSTEMS:   Constitutional: ( - ) fevers, ( - )  chills , ( - ) night sweats Eyes: ( - ) blurriness of vision, ( - ) double vision, ( - ) watery eyes Ears, nose, mouth, throat, and face: ( - ) mucositis, ( - ) sore throat Respiratory: ( - ) cough, ( - ) dyspnea, ( - ) wheezes Cardiovascular: ( - ) palpitation, ( - ) chest discomfort, ( - ) lower extremity swelling Gastrointestinal:  ( - ) nausea, ( - ) heartburn, ( - ) change in bowel habits Skin: ( - ) abnormal skin rashes Lymphatics: ( - ) new lymphadenopathy, ( - ) easy bruising Neurological: ( - ) numbness, ( - ) tingling, ( - ) new weaknesses Behavioral/Psych: ( - ) mood change, ( - ) new changes  All other systems were reviewed with the patient and are negative.  PHYSICAL EXAMINATION:  There were no vitals filed for this visit. There were no vitals filed for this visit.  GENERAL: well appearing *** in NAD  SKIN: skin color, texture, turgor are normal, no rashes or  significant lesions EYES: conjunctiva are pink and non-injected, sclera clear LUNGS: clear to auscultation and percussion with normal breathing effort HEART: regular rate & rhythm and no murmurs and no lower extremity edema Musculoskeletal: no cyanosis of digits and no clubbing  PSYCH: alert & oriented x 3, fluent speech NEURO: no focal motor/sensory deficits  LABORATORY DATA:  I have reviewed the data as listed  Latest Ref Rng & Units 06/21/2022    4:19 PM 10/07/2021    4:12 AM 10/06/2021    2:05 PM  CBC  WBC 4.0 - 10.5 K/uL 13.0  8.7  8.4   Hemoglobin 13.0 - 17.0 g/dL 96.0  45.4  09.8   Hematocrit 39.0 - 52.0 % 47.8  41.1  45.2   Platelets 150.0 - 400.0 K/uL 260.0  290  320        Latest Ref Rng & Units 06/21/2022    4:19 PM 10/07/2021    4:12 AM 10/06/2021    2:05 PM  CMP  Glucose 70 - 99 mg/dL 119  147  829   BUN 6 - 23 mg/dL 17  9  11    Creatinine 0.40 - 1.50 mg/dL 5.62  1.30  8.65   Sodium 135 - 145 mEq/L 140  138  141   Potassium 3.5 - 5.1 mEq/L 4.3  3.7  4.0   Chloride 96 - 112 mEq/L 101  109  106   CO2 19 - 32 mEq/L 29  24  23    Calcium 8.4 - 10.5 mg/dL 9.8  8.5  9.3   Total Protein 6.0 - 8.3 g/dL 7.1  6.3    Total Bilirubin 0.2 - 1.2 mg/dL 0.8  0.8    Alkaline Phos 39 - 117 U/L 63  52    AST 0 - 37 U/L 18  19    ALT 0 - 53 U/L 31  16       ASSESSMENT & PLAN ***  After review of the labs, review of the records, and discussion with the patient the patients findings are most consistent with ***  No orders of the defined types were placed in this encounter.   All questions were answered. The patient knows to call the clinic with any problems, questions or concerns.  A total of more than 60 minutes were spent on this encounter with face-to-face time and non-face-to-face time, including preparing to see the patient, ordering tests and/or medications, counseling the patient and coordination of care as outlined above.   Ulysees Barns, MD Department of  Hematology/Oncology [facility] Phone: [phone] Pager: [phone] Email: [email or web address]  11/15/2022 11:12 PM

## 2022-11-16 ENCOUNTER — Other Ambulatory Visit: Payer: Self-pay

## 2022-11-16 ENCOUNTER — Inpatient Hospital Stay: Payer: Medicare PPO | Attending: Hematology and Oncology | Admitting: Hematology and Oncology

## 2022-11-16 ENCOUNTER — Inpatient Hospital Stay: Payer: Medicare PPO

## 2022-11-16 VITALS — BP 154/74 | HR 57 | Temp 98.1°F | Resp 16 | Wt 160.2 lb

## 2022-11-16 DIAGNOSIS — C7951 Secondary malignant neoplasm of bone: Secondary | ICD-10-CM | POA: Diagnosis not present

## 2022-11-16 DIAGNOSIS — C61 Malignant neoplasm of prostate: Secondary | ICD-10-CM | POA: Insufficient documentation

## 2022-11-16 DIAGNOSIS — R232 Flushing: Secondary | ICD-10-CM | POA: Insufficient documentation

## 2022-11-16 LAB — CBC WITH DIFFERENTIAL (CANCER CENTER ONLY)
Abs Immature Granulocytes: 0.03 10*3/uL (ref 0.00–0.07)
Basophils Absolute: 0 10*3/uL (ref 0.0–0.1)
Basophils Relative: 0 %
Eosinophils Absolute: 0.3 10*3/uL (ref 0.0–0.5)
Eosinophils Relative: 3 %
HCT: 41.3 % (ref 39.0–52.0)
Hemoglobin: 14.4 g/dL (ref 13.0–17.0)
Immature Granulocytes: 0 %
Lymphocytes Relative: 24 %
Lymphs Abs: 2.4 10*3/uL (ref 0.7–4.0)
MCH: 34.6 pg — ABNORMAL HIGH (ref 26.0–34.0)
MCHC: 34.9 g/dL (ref 30.0–36.0)
MCV: 99.3 fL (ref 80.0–100.0)
Monocytes Absolute: 0.7 10*3/uL (ref 0.1–1.0)
Monocytes Relative: 6 %
Neutro Abs: 6.8 10*3/uL (ref 1.7–7.7)
Neutrophils Relative %: 67 %
Platelet Count: 290 10*3/uL (ref 150–400)
RBC: 4.16 MIL/uL — ABNORMAL LOW (ref 4.22–5.81)
RDW: 13.1 % (ref 11.5–15.5)
WBC Count: 10.3 10*3/uL (ref 4.0–10.5)
nRBC: 0 % (ref 0.0–0.2)

## 2022-11-16 LAB — CMP (CANCER CENTER ONLY)
ALT: 18 U/L (ref 0–44)
AST: 17 U/L (ref 15–41)
Albumin: 4.2 g/dL (ref 3.5–5.0)
Alkaline Phosphatase: 111 U/L (ref 38–126)
Anion gap: 5 (ref 5–15)
BUN: 18 mg/dL (ref 8–23)
CO2: 33 mmol/L — ABNORMAL HIGH (ref 22–32)
Calcium: 9.6 mg/dL (ref 8.9–10.3)
Chloride: 103 mmol/L (ref 98–111)
Creatinine: 0.95 mg/dL (ref 0.61–1.24)
GFR, Estimated: >60 mL/min (ref >60)
Glucose, Bld: 198 mg/dL — ABNORMAL HIGH (ref 70–99)
Potassium: 4.3 mmol/L (ref 3.5–5.1)
Sodium: 141 mmol/L (ref 135–145)
Total Bilirubin: 0.5 mg/dL (ref 0.3–1.2)
Total Protein: 7.2 g/dL (ref 6.5–8.1)

## 2022-11-17 ENCOUNTER — Telehealth: Payer: Self-pay | Admitting: Hematology and Oncology

## 2022-11-18 NOTE — Progress Notes (Signed)
Spoke with patient/patients wife via telephone to introduce myself as the prostate nurse navigator and discussed my role.  No barriers to care identified at this time.  Patient had med onc visit on 8/7 with Dr. Leonides Schanz, and I was unable to meet at time of visit.  RN provided my direct number, and encouraged to call with any questions or needs that may arise.

## 2022-11-21 ENCOUNTER — Telehealth: Payer: Self-pay | Admitting: *Deleted

## 2022-11-21 ENCOUNTER — Ambulatory Visit: Payer: Medicare PPO | Admitting: Family

## 2022-11-21 ENCOUNTER — Ambulatory Visit (INDEPENDENT_AMBULATORY_CARE_PROVIDER_SITE_OTHER)
Admission: RE | Admit: 2022-11-21 | Discharge: 2022-11-21 | Disposition: A | Discharge status: Home / Self Care | Payer: Medicare PPO | Source: Ambulatory Visit | Attending: Family | Admitting: Family

## 2022-11-21 VITALS — BP 131/65 | HR 72 | Temp 98.0°F | Ht 65.0 in | Wt 160.0 lb

## 2022-11-21 DIAGNOSIS — M25551 Pain in right hip: Secondary | ICD-10-CM

## 2022-11-21 DIAGNOSIS — M160 Bilateral primary osteoarthritis of hip: Secondary | ICD-10-CM | POA: Diagnosis not present

## 2022-11-21 MED ORDER — METHOCARBAMOL 500 MG PO TABS
500.0000 mg | ORAL_TABLET | Freq: Four times a day (QID) | ORAL | 1 refills | Status: DC | PRN
Start: 2022-11-21 — End: 2022-12-20

## 2022-11-21 NOTE — Patient Instructions (Signed)
It was very nice to see you today!   Go over to our [place] office on [address] for the xray. I will notify you of the results after it has been reviewed. This unfortunately, can take up to a week to 10 days to be read.  Continue to apply heat or ice up to at a time several times a day - whichever gives you the most relief. You can apply over the counter analgesic creams 3 times per day for pain. OK to continue taking extra strength or arthritis strength Tylenol 3 times per day for pain & I have sent over a muscle relaxer (Robaxin) to take as needed for pain. I would recommend taking this at night to help you sleep without pain.  It is ok to take the Tylenol with the Robaxin or alternate, either way is fine.       PLEASE NOTE:  If you had any lab tests please let us know if you have not heard back within a few days. You may see your results on MyChart before we have a chance to review them but we will give you a call once they are reviewed by [place]. If we ordered any referrals today, please let us know if you have not heard from their office within the next week.

## 2022-11-21 NOTE — Telephone Encounter (Signed)
Received vm message from pt stating that he is having trouble walking over the weekend.  Asked for a call back. TCT patient and spoke with him.  He states he might hav pulled a muscle in his right hip. States it is painful today. He is actually on his way to see his PCP. States he will call back when he gets home. Pt with metastatic prostate cancer. Recent PET scan reveals mets to right femur and right iliac crest.

## 2022-11-21 NOTE — Progress Notes (Signed)
Patient ID: [name] [name], male    DOB: 01/12/1946, 77 y.o.   MRN: [id number]  Chief Complaint  Patient presents with   Leg Pain    Pt c/o right leg muscle pain since yesterday. Has tried tylenol which did not help sx.     HPI:      Leg pain:  The patient reports pain in the right leg, particularly from the knee up to his upper thigh, mostly on lateral side and into his right hip, which started yesterday. - The pain is described as dull and achy, not sharp or stabbing. - There is no burning, numbness, or tingling sensation. - The pain is severe enough that the patient cannot put weight on the leg very easily and is using a wheelchair provided by the front desk - The patient tried Tylenol and alternating heat and ice, which provided some relief. - The patient has not tried ibuprofen or Aleve due to being on Plavix. - The patient was doing some physical activity in the garage on Friday and also weed trimming which he has not done in a long time & may have contributed to the pain. - The patient has used analgesic creams, which have not helped. - The patient has no history of hip pain, injury, dislocation, or fracture. Per review of chart, pt has been on Zytiga for about a month and myalgia is a SE.     Assessment & Plan:  1. Acute right hip pain - Muscle pain possibly exacerbated by Zytiga? - Sending for an X-ray to rule out bone spur, dislocation, or other issues. - Prescribing Robaxin (a muscle relaxer) to be taken with or alternating with Tylenol. - Continue using analgesic creams and alternating heat and ice. - If the pain does not improve in a week or two, consider referring to orthopedics and/or PT.  - methocarbamol (ROBAXIN) 500 MG tablet; Take 1-2 tablets (500-1,000 mg total) by mouth every 6 (six) hours as needed for muscle spasms (For right hip pain.).  Dispense: 30 tablet; Refill: 1 - DG HIP UNILAT W OR W/O PELVIS 2-3 VIEWS RIGHT; Future   Subjective:    Outpatient Medications Prior to  Visit  Medication Sig Dispense Refill   abiraterone acetate (ZYTIGA) 250 MG tablet Take 4 tablets (1,000 mg total) by mouth daily. Take on an empty stomach 1 hour before or 2 hours after a meal 4 tablet 0   acetaminophen (TYLENOL) 500 MG tablet Take 1 tablet (500 mg total) by mouth every 6 (six) hours as needed. 30 tablet 0   aspirin EC 81 MG tablet Take 1 tablet (81 mg total) by mouth daily. Swallow whole. (Patient taking differently: Take 81 mg by mouth as needed. Swallow whole.) 30 tablet 0   calcium carbonate (TUMS EX) 750 MG chewable tablet Chew 0.5 tablets by mouth daily.     clopidogrel (PLAVIX) 75 MG tablet Take 1 tablet (75 mg total) by mouth daily. 30 tablet 5   polyethylene glycol powder (GLYCOLAX/MIRALAX) 17 GM/SCOOP powder Take 17 g by mouth in the morning and at bedtime. 17 g 0   predniSONE (DELTASONE) 5 MG tablet Take 1 tablet (5 mg total) by mouth daily with breakfast. 1 tablet 0   No facility-administered medications prior to visit.   Past Medical History:  Diagnosis Date   CAD (coronary artery disease)    Constipation    Diabetes mellitus without complication (HCC)    borderline/ pre diabetic   NSTEMI (non-ST elevated myocardial infarction) (HCC)  at [place]   Prostate cancer [facility])    Stroke (HCC)    L side - 40 years ago   Past Surgical History:  Procedure Laterality Date   CARDIAC CATHETERIZATION     CATARACT EXTRACTION Bilateral    05/2021; 07/2021   CORONARY ARTERY BYPASS GRAFT     at [facility]   No Known Allergies    Objective:    Physical Exam Vitals and nursing note reviewed.  Constitutional:      General: He is not in acute distress.    Appearance: Normal appearance.  HENT:     Head: Normocephalic.  Cardiovascular:     Rate and Rhythm: Normal rate and regular rhythm.  Pulmonary:     Effort: Pulmonary effort is normal.     Breath sounds: Normal breath sounds.  Musculoskeletal:     Cervical back: Normal range of motion.     Right hip:  Tenderness and bony tenderness present. Decreased range of motion. Decreased strength.     Right upper leg: Swelling (mild on lateral side o thigh) present. No tenderness or bony tenderness.  Skin:    General: Skin is warm and dry.  Neurological:     Mental Status: He is alert and oriented to person, place, and time.  Psychiatric:        Mood and Affect: Mood normal.    BP 131/65   Pulse 72   Temp 98 F (36.7 C) (Temporal)   Ht 5\' 5"  (1.651 m)   Wt 160 lb (72.6 kg) Comment: Pt in wheelchair  SpO2 99%   BMI 26.63 kg/m  Wt Readings from Last 3 Encounters:  11/21/22 160 lb (72.6 kg)  11/16/22 160 lb 3.2 oz (72.7 kg)  10/14/22 157 lb 3.2 oz (71.3 kg)      Dulce Sellar, NP

## 2022-11-21 NOTE — Telephone Encounter (Signed)
Received vm message from pt's wife regarding her husband's difficultly in walking/painful walking TCT wife . No answer. Left vm message for her to return call to [phone] at her convenience.

## 2022-11-23 ENCOUNTER — Encounter: Payer: Self-pay | Admitting: Family Medicine

## 2022-11-25 NOTE — Telephone Encounter (Signed)
I can refer him to podiatry ([facility]) but what is the diagnosis?

## 2022-12-15 ENCOUNTER — Other Ambulatory Visit: Payer: Self-pay | Admitting: Hematology and Oncology

## 2022-12-15 ENCOUNTER — Inpatient Hospital Stay: Payer: Medicare PPO | Attending: Hematology and Oncology

## 2022-12-15 ENCOUNTER — Inpatient Hospital Stay (HOSPITAL_BASED_OUTPATIENT_CLINIC_OR_DEPARTMENT_OTHER): Payer: Medicare PPO | Admitting: Hematology and Oncology

## 2022-12-15 VITALS — BP 138/71 | HR 62 | Temp 97.4°F | Resp 13 | Wt 161.9 lb

## 2022-12-15 DIAGNOSIS — C7951 Secondary malignant neoplasm of bone: Secondary | ICD-10-CM

## 2022-12-15 DIAGNOSIS — C779 Secondary and unspecified malignant neoplasm of lymph node, unspecified: Secondary | ICD-10-CM | POA: Diagnosis not present

## 2022-12-15 DIAGNOSIS — C61 Malignant neoplasm of prostate: Secondary | ICD-10-CM | POA: Insufficient documentation

## 2022-12-15 DIAGNOSIS — Z87891 Personal history of nicotine dependence: Secondary | ICD-10-CM | POA: Insufficient documentation

## 2022-12-15 LAB — CMP (CANCER CENTER ONLY)
ALT: 14 U/L (ref 0–44)
AST: 15 U/L (ref 15–41)
Albumin: 3.9 g/dL (ref 3.5–5.0)
Alkaline Phosphatase: 78 U/L (ref 38–126)
Anion gap: 6 (ref 5–15)
BUN: 18 mg/dL (ref 8–23)
CO2: 29 mmol/L (ref 22–32)
Calcium: 9.3 mg/dL (ref 8.9–10.3)
Chloride: 105 mmol/L (ref 98–111)
Creatinine: 0.97 mg/dL (ref 0.61–1.24)
GFR, Estimated: >60 mL/min (ref >60)
Glucose, Bld: 212 mg/dL — ABNORMAL HIGH (ref 70–99)
Potassium: 4.2 mmol/L (ref 3.5–5.1)
Sodium: 140 mmol/L (ref 135–145)
Total Bilirubin: 0.6 mg/dL (ref 0.3–1.2)
Total Protein: 6.7 g/dL (ref 6.5–8.1)

## 2022-12-15 LAB — CBC WITH DIFFERENTIAL (CANCER CENTER ONLY)
Abs Immature Granulocytes: 0.02 10*3/uL (ref 0.00–0.07)
Basophils Absolute: 0 10*3/uL (ref 0.0–0.1)
Basophils Relative: 0 %
Eosinophils Absolute: 0.2 10*3/uL (ref 0.0–0.5)
Eosinophils Relative: 2 %
HCT: 41.6 % (ref 39.0–52.0)
Hemoglobin: 14.2 g/dL (ref 13.0–17.0)
Immature Granulocytes: 0 %
Lymphocytes Relative: 26 %
Lymphs Abs: 2.2 10*3/uL (ref 0.7–4.0)
MCH: 33.8 pg (ref 26.0–34.0)
MCHC: 34.1 g/dL (ref 30.0–36.0)
MCV: 99 fL (ref 80.0–100.0)
Monocytes Absolute: 0.4 10*3/uL (ref 0.1–1.0)
Monocytes Relative: 4 %
Neutro Abs: 5.8 10*3/uL (ref 1.7–7.7)
Neutrophils Relative %: 68 %
Platelet Count: 247 10*3/uL (ref 150–400)
RBC: 4.2 MIL/uL — ABNORMAL LOW (ref 4.22–5.81)
RDW: 13 % (ref 11.5–15.5)
WBC Count: 8.6 10*3/uL (ref 4.0–10.5)
nRBC: 0 % (ref 0.0–0.2)

## 2022-12-15 NOTE — Progress Notes (Signed)
[facility] [phone]   Fax:([phone]  PROGRESS NOTE  Patient Care Team: Nelwyn Salisbury, MD as PCP - General (Family Medicine) Jens Som Madolyn Frieze, MD as PCP - Cardiology (Cardiology) Drema Dallas, DO as Consulting Physician (Neurology) Cherlyn Cushing, RN as Oncology Nurse Navigator  Hematological/Oncological History # Metastatic Prostate Cancer  06/21/2022: PSA 87.3 07/20/2022: RP lymphadenopathy, nodular prostate and 2 pulmonary nodules.  09/26/2022: TRUS biopsy showed adenocarcinoma of the prostate, Gleason Score 5+4 =9  10/27/2022: NM PSMA scan showed metastatic spread to lymph nodes/bones  10/2022: started Eligard 45 mg subcutaneous and Abiraterone 1000 mg PO daily with prednisone 5 mg PO daily.  11/16/2022: establish care with Dr. Leonides Schanz   Interval History:  [name] [name] 77 y.o. male with medical history significant for metastatic castrate sensitive prostate cancer who presents for a follow up visit. The patient's last visit was on 11/16/2022 at which time he established care. In the interim since the last visit he is continued on Zytiga therapy as prescribed.  On exam today [name] [name] is accompanied by his wife.  He reports he is been feeling well overall in the interim since her last visit.  He notes has been very active cutting the grass and doing his "honeydew list".  He notes that his energy levels have been good overall.  He reports he took a trip to [place] over the holiday and he did well with that trip.  He notes he is not having any major side effects from his Zytiga therapy other than occasional hot flashes, sweats, and mood swings.  He reports he does tend to get up a few times per night to urinate and has to sit under a fan due to occasional hot flashes.  He notes he is eating well with no nausea, vomiting, or diarrhea though he does have some occasional constipation.  Otherwise he reports no fevers, chills, sweats.  Full 10 point ROS is otherwise  negative.  MEDICAL HISTORY:  Past Medical History:  Diagnosis Date   CAD (coronary artery disease)    Constipation    Diabetes mellitus without complication (HCC)    borderline/ pre diabetic   NSTEMI (non-ST elevated myocardial infarction) (HCC)    at [place]   Prostate cancer (HCC)    Stroke (HCC)    L side - 40 years ago    SURGICAL HISTORY: Past Surgical History:  Procedure Laterality Date   CARDIAC CATHETERIZATION     CATARACT EXTRACTION Bilateral    05/2021; 07/2021   CORONARY ARTERY BYPASS GRAFT     at [facility]    SOCIAL HISTORY: Social History   Socioeconomic History   Marital status: Married    Spouse name: Not on file   Number of children: 2   Years of education: Not on file   Highest education level: Master's degree (e.g., MA, MS, MEng, MEd, MSW, MBA)  Occupational History   Occupation: retired  Tobacco Use   Smoking status: Former    Current packs/day: 0.00    Types: Cigarettes    Quit date: 04/11/1968    Years since quitting: 54.7   Smokeless tobacco: Never   Tobacco comments:    quit in the 1970s  Vaping Use   Vaping status: Never Used  Substance and Sexual Activity   Alcohol use: Not Currently    Comment: Rare   Drug use: No   Sexual activity: Not on file  Other Topics Concern   Not on file  Social History Narrative  Right handed   One story home   Drinks no caffeine   Social Determinants of Health   Financial Resource Strain: Low Risk  (10/11/2022)   Overall Financial Resource Strain (CARDIA)    Difficulty of Paying Living Expenses: Not hard at all  Food Insecurity: No Food Insecurity (10/11/2022)   Hunger Vital Sign    Worried About Running Out of Food in the Last Year: Never true    Ran Out of Food in the Last Year: Never true  Transportation Needs: No Transportation Needs (10/11/2022)   PRAPARE - Administrator, Civil Service (Medical): No    Lack of Transportation (Non-Medical): No  Physical Activity: Unknown (10/11/2022)    Exercise Vital Sign    Days of Exercise per Week: 5 days    Minutes of Exercise per Session: Patient declined  Stress: No Stress Concern Present (10/11/2022)   [employer] of Occupational Health - Occupational Stress Questionnaire    Feeling of Stress : Only a little  Social Connections: Unknown (10/11/2022)   Social Connection and Isolation Panel [NHANES]    Frequency of Communication with Friends and Family: More than three times a week    Frequency of Social Gatherings with Friends and Family: Patient declined    Attends Religious Services: Patient declined    Database administrator or Organizations: No    Attends Engineer, structural: Not on file    Marital Status: Married  Catering manager Violence: Not on file    FAMILY HISTORY: Family History  Problem Relation Age of Onset   Dementia Mother        lived to 101 years   Liver disease Father    Diabetes Sister    Cirrhosis Brother    Cancer - Colon Neg Hx    Colon polyps Neg Hx    Rectal cancer Neg Hx    Esophageal cancer Neg Hx    Stomach cancer Neg Hx    Pancreatic cancer Neg Hx     ALLERGIES:  has No Known Allergies.  MEDICATIONS:  Current Outpatient Medications  Medication Sig Dispense Refill   abiraterone acetate (ZYTIGA) 250 MG tablet Take 4 tablets (1,000 mg total) by mouth daily. Take on an empty stomach 1 hour before or 2 hours after a meal 4 tablet 0   acetaminophen (TYLENOL) 500 MG tablet Take 1 tablet (500 mg total) by mouth every 6 (six) hours as needed. 30 tablet 0   aspirin EC 81 MG tablet Take 1 tablet (81 mg total) by mouth daily. Swallow whole. (Patient taking differently: Take 81 mg by mouth as needed. Swallow whole.) 30 tablet 0   calcium carbonate (TUMS EX) 750 MG chewable tablet Chew 0.5 tablets by mouth daily.     clopidogrel (PLAVIX) 75 MG tablet Take 1 tablet (75 mg total) by mouth daily. 30 tablet 5   methocarbamol (ROBAXIN) 500 MG tablet Take 1-2 tablets (500-1,000 mg total) by  mouth every 6 (six) hours as needed for muscle spasms (For right hip pain.). 30 tablet 1   polyethylene glycol powder (GLYCOLAX/MIRALAX) 17 GM/SCOOP powder Take 17 g by mouth in the morning and at bedtime. 17 g 0   predniSONE (DELTASONE) 5 MG tablet Take 1 tablet (5 mg total) by mouth daily with breakfast. 1 tablet 0   No current facility-administered medications for this visit.    REVIEW OF SYSTEMS:   Constitutional: ( - ) fevers, ( - )  chills , ( - ) night  sweats Eyes: ( - ) blurriness of vision, ( - ) double vision, ( - ) watery eyes Ears, nose, mouth, throat, and face: ( - ) mucositis, ( - ) sore throat Respiratory: ( - ) cough, ( - ) dyspnea, ( - ) wheezes Cardiovascular: ( - ) palpitation, ( - ) chest discomfort, ( - ) lower extremity swelling Gastrointestinal:  ( - ) nausea, ( - ) heartburn, ( - ) change in bowel habits Skin: ( - ) abnormal skin rashes Lymphatics: ( - ) new lymphadenopathy, ( - ) easy bruising Neurological: ( - ) numbness, ( - ) tingling, ( - ) new weaknesses Behavioral/Psych: ( - ) mood change, ( - ) new changes  All other systems were reviewed with the patient and are negative.  PHYSICAL EXAMINATION:  Vitals:   12/15/22 1415  BP: 138/71  Pulse: 62  Resp: 13  Temp: (!) 97.4 F (36.3 C)  SpO2: 99%   Filed Weights   12/15/22 1415  Weight: 161 lb 14.4 oz (73.4 kg)    GENERAL: Well-appearing elderly African-American male, alert, no distress and comfortable SKIN: skin color, texture, turgor are normal, no rashes or significant lesions EYES: conjunctiva are pink and non-injected, sclera clear LUNGS: clear to auscultation and percussion with normal breathing effort HEART: regular rate & rhythm and no murmurs and no lower extremity edema Musculoskeletal: no cyanosis of digits and no clubbing  PSYCH: alert & oriented x 3, fluent speech NEURO: no focal motor/sensory deficits  LABORATORY DATA:  I have reviewed the data as listed    Latest Ref Rng & Units  12/15/2022    1:40 PM 11/16/2022    9:54 AM 06/21/2022    4:19 PM  CBC  WBC 4.0 - 10.5 K/uL 8.6  10.3  13.0   Hemoglobin 13.0 - 17.0 g/dL 45.4  09.8  11.9   Hematocrit 39.0 - 52.0 % 41.6  41.3  47.8   Platelets 150 - 400 K/uL 247  290  260.0        Latest Ref Rng & Units 12/15/2022    1:40 PM 11/16/2022    9:54 AM 06/21/2022    4:19 PM  CMP  Glucose 70 - 99 mg/dL 147  829  562   BUN 8 - 23 mg/dL 18  18  17    Creatinine 0.61 - 1.24 mg/dL 1.30  8.65  7.84   Sodium 135 - 145 mmol/L 140  141  140   Potassium 3.5 - 5.1 mmol/L 4.2  4.3  4.3   Chloride 98 - 111 mmol/L 105  103  101   CO2 22 - 32 mmol/L 29  33  29   Calcium 8.9 - 10.3 mg/dL 9.3  9.6  9.8   Total Protein 6.5 - 8.1 g/dL 6.7  7.2  7.1   Total Bilirubin 0.3 - 1.2 mg/dL 0.6  0.5  0.8   Alkaline Phos 38 - 126 U/L 78  111  63   AST 15 - 41 U/L 15  17  18    ALT 0 - 44 U/L 14  18  31     RADIOGRAPHIC STUDIES: DG HIP UNILAT W OR W/O PELVIS 2-3 VIEWS RIGHT  Result Date: 11/27/2022 CLINICAL DATA:  Severe right hip pain for 3 days EXAM: DG HIP (WITH OR WITHOUT PELVIS) 3V RIGHT COMPARISON:  None Available. FINDINGS: There is no evidence of hip fracture or dislocation. Pelvic ring is intact. Mild bilateral hip degenerative changes with joint space narrowing. IMPRESSION: Mild  bilateral hip osteoarthritis.  No acute osseous abnormalities. Electronically Signed   By: Layla Maw M.D.   On: 11/27/2022 12:04    ASSESSMENT & PLAN Graysen Mcclain 77 y.o. male with medical history significant for metastatic castrate sensitive prostate cancer who presents for a follow up visit.   After review of the labs, review of the records, and discussion with the patient the patients findings are most consistent with metastatic adenocarcinoma of the prostate, castrate sensitive.   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 -- Patient started Lupron and Zytiga  1000 mg p.o. daily with 5 mg prednisone in late July 2024. -- Diagnosis confirmed with prostate biopsy Gleason 9 with PSMA scan showing widespread metastasis to lymph nodes and bones. PLAN:  -- rechecked PSA and testosterone level today.  PSA dropped to 1.1 at last check with testosterone undetectable levels -- Will plan for next Eligard shot 45 mg in January 2025 -- Return to clinic monthly for the next 1 month while on Zytiga therapy.  Continue Zytiga 1000 mg p.o. daily with prednisone 5 mg.  We will take over this prescription from [facility].  -- Imaging pending the results of his PSA.  If PSA is rising we will consider scan. -- Return to clinic in 4 weeks to reevaluate. After next visit can extend to q 3 month visits.   No orders of the defined types were placed in this encounter.   All questions were answered. The patient knows to call the clinic with any problems, questions or concerns.  A total of more than 30 minutes were spent on this encounter with face-to-face time and non-face-to-face time, including preparing to see the patient, ordering tests and/or medications, counseling the patient and coordination of care as outlined above.   Ulysees Barns, MD Department of Hematology/Oncology [facility] Phone: [phone] Pager: [phone] Email: [email or web address]  12/15/2022 3:04 PM

## 2022-12-16 LAB — TESTOSTERONE: Testosterone: <3 ng/dL — ABNORMAL LOW (ref 264–916)

## 2022-12-16 LAB — PROSTATE-SPECIFIC AG, SERUM (LABCORP): Prostate Specific Ag, Serum: 0.2 ng/mL (ref 0.0–4.0)

## 2022-12-20 ENCOUNTER — Encounter: Payer: Self-pay | Admitting: Podiatry

## 2022-12-20 ENCOUNTER — Ambulatory Visit (INDEPENDENT_AMBULATORY_CARE_PROVIDER_SITE_OTHER): Payer: Medicare PPO | Admitting: Podiatry

## 2022-12-20 DIAGNOSIS — B351 Tinea unguium: Secondary | ICD-10-CM | POA: Diagnosis not present

## 2022-12-20 DIAGNOSIS — M79676 Pain in unspecified toe(s): Secondary | ICD-10-CM

## 2022-12-21 NOTE — Progress Notes (Signed)
He presents today chief complaint of painful elongated toenails.  Objective: Vital signs are stable alert oriented x 3.  Pulses are palpable.  Toenails are long thick yellow dystrophic clinically mycotic.  Assessment: Pain limb secondary to onychomycosis.  Plan: Debridement of toenails 1 through 5 bilateral.

## 2022-12-27 ENCOUNTER — Other Ambulatory Visit: Payer: Self-pay | Admitting: Hematology and Oncology

## 2023-01-06 ENCOUNTER — Telehealth: Payer: Self-pay | Admitting: Hematology and Oncology

## 2023-01-12 ENCOUNTER — Ambulatory Visit: Payer: Medicare PPO | Admitting: Hematology and Oncology

## 2023-01-12 ENCOUNTER — Other Ambulatory Visit: Payer: Medicare PPO

## 2023-01-19 ENCOUNTER — Other Ambulatory Visit: Payer: Self-pay | Admitting: Physician Assistant

## 2023-01-19 ENCOUNTER — Inpatient Hospital Stay: Payer: Medicare PPO | Attending: Hematology and Oncology

## 2023-01-19 ENCOUNTER — Inpatient Hospital Stay: Payer: Medicare PPO | Admitting: Hematology and Oncology

## 2023-01-19 DIAGNOSIS — C61 Malignant neoplasm of prostate: Secondary | ICD-10-CM

## 2023-01-19 NOTE — Progress Notes (Signed)
[facility] [phone]   Fax:(336) [phone]  PROGRESS NOTE  Patient Care Team: Nelwyn Salisbury, MD as PCP - General (Family Medicine) Jens Som Madolyn Frieze, MD as PCP - Cardiology (Cardiology) Drema Dallas, DO as Consulting Physician (Neurology) Cherlyn Cushing, RN as Oncology Nurse Navigator  Hematological/Oncological History # Metastatic Prostate Cancer  06/21/2022: PSA 87.3 07/20/2022: RP lymphadenopathy, nodular prostate and 2 pulmonary nodules.  09/26/2022: TRUS biopsy showed adenocarcinoma of the prostate, Gleason Score 5+4 =9  10/27/2022: NM PSMA scan showed metastatic spread to lymph nodes/bones  10/2022: started Eligard 45 mg subcutaneous and Abiraterone 1000 mg PO daily with prednisone 5 mg PO daily.  11/16/2022: establish care with Dr. Leonides Schanz   Interval History:   Discussed the use of AI scribe software for clinical note transcription with the patient, who gave verbal consent to proceed.  [name] [name] 77 y.o. male with medical history significant for metastatic castrate sensitive prostate cancer who presents for a follow up visit. The patient's last visit was on 12/15/2022 at which time he established care. In the interim since the last visit he is continued on Zytiga therapy as prescribed.  On exam today, Mr. [name] is accompanied by his wife.  He reports he is been feeling well overall in the interim since her last visit. The patient reports adherence to his medication regimen, acknowledging its effectiveness in controlling his prostate cancer.   The patient reports experiencing side effects from zytiga and prednisone, including night sweats, constipation, and mood changes. He describes the night sweats are manageable and he is able return back to sleep. The constipation is controlled with Miralax, taken two to three times a week. The patient also reports mood changes, describing himself as more on edge since starting the prednisone.  He denies fevers, chills,  sweats, shortness of breath, chest pain, cough, nausea, vomiting or diarrhea. He has no other complaints. Full 10 point ROS is otherwise negative.  MEDICAL HISTORY:  Past Medical History:  Diagnosis Date   CAD (coronary artery disease)    Constipation    Diabetes mellitus without complication (HCC)    borderline/ pre diabetic   NSTEMI (non-ST elevated myocardial infarction) (HCC)    at [place]   Prostate cancer (HCC)    Stroke (HCC)    L side - 40 years ago    SURGICAL HISTORY: Past Surgical History:  Procedure Laterality Date   CARDIAC CATHETERIZATION     CATARACT EXTRACTION Bilateral    05/2021; 07/2021   CORONARY ARTERY BYPASS GRAFT     at [facility]    SOCIAL HISTORY: Social History   Socioeconomic History   Marital status: Married    Spouse name: Not on file   Number of children: 2   Years of education: Not on file   Highest education level: Master's degree (e.g., MA, MS, MEng, MEd, MSW, MBA)  Occupational History   Occupation: retired  Tobacco Use   Smoking status: Former    Current packs/day: 0.00    Types: Cigarettes    Quit date: 04/11/1968    Years since quitting: 54.8   Smokeless tobacco: Never   Tobacco comments:    quit in the 1970s  Vaping Use   Vaping status: Never Used  Substance and Sexual Activity   Alcohol use: Not Currently    Comment: Rare   Drug use: No   Sexual activity: Not on file  Other Topics Concern   Not on file  Social History Narrative   Right handed  One story home   Drinks no caffeine   Social Determinants of Health   Financial Resource Strain: Low Risk  (10/11/2022)   Overall Financial Resource Strain (CARDIA)    Difficulty of Paying Living Expenses: Not hard at all  Food Insecurity: No Food Insecurity (10/11/2022)   Hunger Vital Sign    Worried About Running Out of Food in the Last Year: Never true    Ran Out of Food in the Last Year: Never true  Transportation Needs: No Transportation Needs (10/11/2022)   PRAPARE -  Administrator, Civil Service (Medical): No    Lack of Transportation (Non-Medical): No  Physical Activity: Unknown (10/11/2022)   Exercise Vital Sign    Days of Exercise per Week: 5 days    Minutes of Exercise per Session: Patient declined  Stress: No Stress Concern Present (10/11/2022)   [employer] of Occupational Health - Occupational Stress Questionnaire    Feeling of Stress : Only a little  Social Connections: Unknown (10/11/2022)   Social Connection and Isolation Panel [NHANES]    Frequency of Communication with Friends and Family: More than three times a week    Frequency of Social Gatherings with Friends and Family: Patient declined    Attends Religious Services: Patient declined    Database administrator or Organizations: No    Attends Engineer, structural: Not on file    Marital Status: Married  Catering manager Violence: Not on file    FAMILY HISTORY: Family History  Problem Relation Age of Onset   Dementia Mother        lived to 101 years   Liver disease Father    Diabetes Sister    Cirrhosis Brother    Cancer - Colon Neg Hx    Colon polyps Neg Hx    Rectal cancer Neg Hx    Esophageal cancer Neg Hx    Stomach cancer Neg Hx    Pancreatic cancer Neg Hx     ALLERGIES:  has No Known Allergies.  MEDICATIONS:  Current Outpatient Medications  Medication Sig Dispense Refill   abiraterone acetate (ZYTIGA) 250 MG tablet Take 4 tablets (1,000 mg total) by mouth daily. Take on an empty stomach 1 hour before or 2 hours after a meal 4 tablet 0   acetaminophen (TYLENOL) 500 MG tablet Take 1 tablet (500 mg total) by mouth every 6 (six) hours as needed. 30 tablet 0   aspirin EC 81 MG tablet Take 1 tablet (81 mg total) by mouth daily. Swallow whole. (Patient taking differently: Take 81 mg by mouth as needed. Swallow whole.) 30 tablet 0   calcium carbonate (TUMS EX) 750 MG chewable tablet Chew 0.5 tablets by mouth daily.     clopidogrel (PLAVIX) 75 MG  tablet Take 1 tablet (75 mg total) by mouth daily. 30 tablet 5   polyethylene glycol powder (GLYCOLAX/MIRALAX) 17 GM/SCOOP powder Take 17 g by mouth in the morning and at bedtime. 17 g 0   predniSONE (DELTASONE) 5 MG tablet Take 1 tablet (5 mg total) by mouth daily with breakfast. 1 tablet 0   No current facility-administered medications for this visit.    REVIEW OF SYSTEMS:   Constitutional: ( - ) fevers, ( - )  chills , ( - ) night sweats Eyes: ( - ) blurriness of vision, ( - ) double vision, ( - ) watery eyes Ears, nose, mouth, throat, and face: ( - ) mucositis, ( - ) sore throat Respiratory: ( - )  cough, ( - ) dyspnea, ( - ) wheezes Cardiovascular: ( - ) palpitation, ( - ) chest discomfort, ( - ) lower extremity swelling Gastrointestinal:  ( - ) nausea, ( - ) heartburn, ( - ) change in bowel habits Skin: ( - ) abnormal skin rashes Lymphatics: ( - ) new lymphadenopathy, ( - ) easy bruising Neurological: ( - ) numbness, ( - ) tingling, ( - ) new weaknesses Behavioral/Psych: ( - ) mood change, ( - ) new changes  All other systems were reviewed with the patient and are negative.  PHYSICAL EXAMINATION:  Vitals:   01/20/23 0842  BP: (!) 162/98  Pulse: 66  Resp: 17  Temp: 97.9 F (36.6 C)  SpO2: 98%   Filed Weights   01/20/23 0842  Weight: 164 lb 3.2 oz (74.5 kg)    GENERAL: Well-appearing elderly African-American male, alert, no distress and comfortable SKIN: skin color, texture, turgor are normal, no rashes or significant lesions EYES: conjunctiva are pink and non-injected, sclera clear LUNGS: clear to auscultation and percussion with normal breathing effort HEART: regular rate & rhythm and no murmurs and no lower extremity edema Musculoskeletal: no cyanosis of digits and no clubbing  PSYCH: alert & oriented x 3, fluent speech NEURO: no focal motor/sensory deficits  LABORATORY DATA:  I have reviewed the data as listed    Latest Ref Rng & Units 01/20/2023    8:16 AM  12/15/2022    1:40 PM 11/16/2022    9:54 AM  CBC  WBC 4.0 - 10.5 K/uL 10.1  8.6  10.3   Hemoglobin 13.0 - 17.0 g/dL 16.1  09.6  04.5   Hematocrit 39.0 - 52.0 % 43.5  41.6  41.3   Platelets 150 - 400 K/uL 278  247  290        Latest Ref Rng & Units 12/15/2022    1:40 PM 11/16/2022    9:54 AM 06/21/2022    4:19 PM  CMP  Glucose 70 - 99 mg/dL 409  811  914   BUN 8 - 23 mg/dL 18  18  17    Creatinine 0.61 - 1.24 mg/dL 7.82  9.56  2.13   Sodium 135 - 145 mmol/L 140  141  140   Potassium 3.5 - 5.1 mmol/L 4.2  4.3  4.3   Chloride 98 - 111 mmol/L 105  103  101   CO2 22 - 32 mmol/L 29  33  29   Calcium 8.9 - 10.3 mg/dL 9.3  9.6  9.8   Total Protein 6.5 - 8.1 g/dL 6.7  7.2  7.1   Total Bilirubin 0.3 - 1.2 mg/dL 0.6  0.5  0.8   Alkaline Phos 38 - 126 U/L 78  111  63   AST 15 - 41 U/L 15  17  18    ALT 0 - 44 U/L 14  18  31     RADIOGRAPHIC STUDIES: No results found.  ASSESSMENT & PLAN Race Beckett 77 y.o. male with medical history significant for metastatic castrate sensitive prostate cancer who presents for a follow up visit.   After review of the labs, review of the records, and discussion with the patient the patients findings are most consistent with metastatic adenocarcinoma of the prostate, castrate sensitive.   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 -- Patient started Lupron and Zytiga 1000 mg p.o. daily with 5 mg prednisone  in late July 2024. -- Diagnosis confirmed with prostate biopsy Gleason 9 with PSMA scan showing widespread metastasis to lymph nodes and bones. PLAN:  --Labs today reviewed. WBC 10.1, Hgb 14.6, Plt 278, creatinine and LFTs normal.  --Last PSA level shows improvement to 0.2 (previously 1.1). Today's pending.  -- Continue Zytiga 1000 mg p.o. daily with prednisone 5 mg.   -- RTC in 2 months with labs, follow up visit, Eligard injection.    No orders of the defined  types were placed in this encounter.   All questions were answered. The patient knows to call the clinic with any problems, questions or concerns.  A total of more than 30 minutes were spent on this encounter with face-to-face time and non-face-to-face time, including preparing to see the patient, ordering tests and/or medications, counseling the patient and coordination of care as outlined above.   Georga Kaufmann PA-C Dept of Hematology and Oncology [facility] at [facility] Phone: [phone]   01/20/2023 8:49 AM

## 2023-01-20 ENCOUNTER — Encounter: Payer: Self-pay | Admitting: Physician Assistant

## 2023-01-20 ENCOUNTER — Inpatient Hospital Stay: Payer: Medicare PPO | Attending: Hematology and Oncology

## 2023-01-20 ENCOUNTER — Other Ambulatory Visit: Payer: Self-pay

## 2023-01-20 ENCOUNTER — Inpatient Hospital Stay: Payer: Medicare PPO | Admitting: Physician Assistant

## 2023-01-20 VITALS — BP 144/68 | HR 66 | Temp 97.9°F | Resp 17 | Ht 65.0 in | Wt 164.2 lb

## 2023-01-20 DIAGNOSIS — R61 Generalized hyperhidrosis: Secondary | ICD-10-CM | POA: Diagnosis not present

## 2023-01-20 DIAGNOSIS — R918 Other nonspecific abnormal finding of lung field: Secondary | ICD-10-CM | POA: Insufficient documentation

## 2023-01-20 DIAGNOSIS — C61 Malignant neoplasm of prostate: Secondary | ICD-10-CM

## 2023-01-20 DIAGNOSIS — Z7952 Long term (current) use of systemic steroids: Secondary | ICD-10-CM | POA: Insufficient documentation

## 2023-01-20 DIAGNOSIS — K5900 Constipation, unspecified: Secondary | ICD-10-CM | POA: Diagnosis not present

## 2023-01-20 DIAGNOSIS — C7951 Secondary malignant neoplasm of bone: Secondary | ICD-10-CM | POA: Insufficient documentation

## 2023-01-20 LAB — CBC WITH DIFFERENTIAL (CANCER CENTER ONLY)
Abs Immature Granulocytes: 0.02 10*3/uL (ref 0.00–0.07)
Basophils Absolute: 0.1 10*3/uL (ref 0.0–0.1)
Basophils Relative: 1 %
Eosinophils Absolute: 0.5 10*3/uL (ref 0.0–0.5)
Eosinophils Relative: 4 %
HCT: 43.5 % (ref 39.0–52.0)
Hemoglobin: 14.6 g/dL (ref 13.0–17.0)
Immature Granulocytes: 0 %
Lymphocytes Relative: 35 %
Lymphs Abs: 3.6 10*3/uL (ref 0.7–4.0)
MCH: 33.7 pg (ref 26.0–34.0)
MCHC: 33.6 g/dL (ref 30.0–36.0)
MCV: 100.5 fL — ABNORMAL HIGH (ref 80.0–100.0)
Monocytes Absolute: 0.7 10*3/uL (ref 0.1–1.0)
Monocytes Relative: 7 %
Neutro Abs: 5.3 10*3/uL (ref 1.7–7.7)
Neutrophils Relative %: 53 %
Platelet Count: 278 10*3/uL (ref 150–400)
RBC: 4.33 MIL/uL (ref 4.22–5.81)
RDW: 12.9 % (ref 11.5–15.5)
WBC Count: 10.1 10*3/uL (ref 4.0–10.5)
nRBC: 0 % (ref 0.0–0.2)

## 2023-01-20 LAB — CMP (CANCER CENTER ONLY)
ALT: 17 U/L (ref 0–44)
AST: 19 U/L (ref 15–41)
Albumin: 4 g/dL (ref 3.5–5.0)
Alkaline Phosphatase: 66 U/L (ref 38–126)
Anion gap: 6 (ref 5–15)
BUN: 18 mg/dL (ref 8–23)
CO2: 30 mmol/L (ref 22–32)
Calcium: 9.9 mg/dL (ref 8.9–10.3)
Chloride: 104 mmol/L (ref 98–111)
Creatinine: 0.98 mg/dL (ref 0.61–1.24)
GFR, Estimated: >60 mL/min (ref >60)
Glucose, Bld: 163 mg/dL — ABNORMAL HIGH (ref 70–99)
Potassium: 4.1 mmol/L (ref 3.5–5.1)
Sodium: 140 mmol/L (ref 135–145)
Total Bilirubin: 0.7 mg/dL (ref 0.3–1.2)
Total Protein: 7.1 g/dL (ref 6.5–8.1)

## 2023-01-22 LAB — TESTOSTERONE: Testosterone: <3 ng/dL — ABNORMAL LOW (ref 264–916)

## 2023-01-22 LAB — PROSTATE-SPECIFIC AG, SERUM (LABCORP): Prostate Specific Ag, Serum: <0.1 ng/mL (ref 0.0–4.0)

## 2023-01-23 ENCOUNTER — Telehealth: Payer: Self-pay

## 2023-01-23 NOTE — Telephone Encounter (Signed)
-----   Message from Briant Cedar sent at 01/23/2023 10:43 AM EDT ----- Please notify patient that PSA level has improved to <0.1 which is the goal. ----- Message ----- From: Interface, Lab In [place] Sent: 01/20/2023   8:33 AM EDT To: Briant Cedar, PA-C

## 2023-01-23 NOTE — Telephone Encounter (Signed)
Pt advised with VU

## 2023-02-15 ENCOUNTER — Inpatient Hospital Stay: Payer: Medicare PPO | Attending: Hematology and Oncology

## 2023-02-15 ENCOUNTER — Inpatient Hospital Stay: Payer: Medicare PPO | Admitting: Hematology and Oncology

## 2023-03-01 ENCOUNTER — Encounter: Payer: Self-pay | Admitting: Family Medicine

## 2023-03-01 ENCOUNTER — Ambulatory Visit: Payer: Medicare PPO | Admitting: Family Medicine

## 2023-03-01 VITALS — BP 100/60 | HR 74 | Temp 98.0°F | Wt 168.0 lb

## 2023-03-01 DIAGNOSIS — C61 Malignant neoplasm of prostate: Secondary | ICD-10-CM

## 2023-03-01 DIAGNOSIS — R232 Flushing: Secondary | ICD-10-CM

## 2023-03-01 NOTE — Progress Notes (Signed)
   Subjective:    Patient ID: [name] [name], male    DOB: [birth year], 77 y.o.   MRN: [id number]  HPI Here to discuss hot flashes. He sees Dr. Marlou Porch for prostate cancer, and he started taking Zytiga and Prednisone about 4 months ago. Ever since starting these he has experience hot flashes and sweats off and on, especially at night. Often at night he feels like his legs are "burning". This disturbs his sleep. He denies any other symptoms.    Review of Systems  Constitutional: Negative.   Respiratory: Negative.    Cardiovascular: Negative.   Gastrointestinal: Negative.   Genitourinary: Negative.        Objective:   Physical Exam Constitutional:      Appearance: Normal appearance. He is not ill-appearing.  Cardiovascular:     Rate and Rhythm: Normal rate and regular rhythm.     Pulses: Normal pulses.     Heart sounds: Normal heart sounds.  Pulmonary:     Effort: Pulmonary effort is normal.     Breath sounds: Normal breath sounds.  Neurological:     Mental Status: He is alert.           Assessment & Plan:  His hot flashes are likely side effects of the Zytiga, and Prednisone may be contributing to this. I suggested he try OTC black cohosh for these. Recheck as needed.  Gershon Crane, MD

## 2023-03-13 ENCOUNTER — Other Ambulatory Visit: Payer: Self-pay | Admitting: Hematology and Oncology

## 2023-03-15 ENCOUNTER — Inpatient Hospital Stay: Payer: Medicare PPO | Attending: Hematology and Oncology | Admitting: Hematology and Oncology

## 2023-03-15 ENCOUNTER — Inpatient Hospital Stay: Payer: Medicare PPO

## 2023-03-15 ENCOUNTER — Encounter: Payer: Self-pay | Admitting: Hematology and Oncology

## 2023-03-15 ENCOUNTER — Other Ambulatory Visit: Payer: Self-pay | Admitting: Hematology and Oncology

## 2023-03-15 VITALS — BP 124/58 | HR 74 | Temp 97.9°F | Resp 17 | Ht 65.0 in | Wt 170.2 lb

## 2023-03-15 DIAGNOSIS — C61 Malignant neoplasm of prostate: Secondary | ICD-10-CM

## 2023-03-15 DIAGNOSIS — C7951 Secondary malignant neoplasm of bone: Secondary | ICD-10-CM | POA: Diagnosis not present

## 2023-03-15 DIAGNOSIS — Z5111 Encounter for antineoplastic chemotherapy: Secondary | ICD-10-CM | POA: Diagnosis not present

## 2023-03-15 LAB — CMP (CANCER CENTER ONLY)
ALT: 14 U/L (ref 0–44)
AST: 13 U/L — ABNORMAL LOW (ref 15–41)
Albumin: 4 g/dL (ref 3.5–5.0)
Alkaline Phosphatase: 67 U/L (ref 38–126)
Anion gap: 7 (ref 5–15)
BUN: 16 mg/dL (ref 8–23)
CO2: 28 mmol/L (ref 22–32)
Calcium: 9.8 mg/dL (ref 8.9–10.3)
Chloride: 102 mmol/L (ref 98–111)
Creatinine: 0.84 mg/dL (ref 0.61–1.24)
GFR, Estimated: >60 mL/min (ref >60)
Glucose, Bld: 372 mg/dL — ABNORMAL HIGH (ref 70–99)
Potassium: 4.2 mmol/L (ref 3.5–5.1)
Sodium: 137 mmol/L (ref 135–145)
Total Bilirubin: 0.5 mg/dL (ref <1.2)
Total Protein: 6.9 g/dL (ref 6.5–8.1)

## 2023-03-15 LAB — CBC WITH DIFFERENTIAL (CANCER CENTER ONLY)
Abs Immature Granulocytes: 0.03 10*3/uL (ref 0.00–0.07)
Basophils Absolute: 0 10*3/uL (ref 0.0–0.1)
Basophils Relative: 0 %
Eosinophils Absolute: 0.3 10*3/uL (ref 0.0–0.5)
Eosinophils Relative: 2 %
HCT: 41.5 % (ref 39.0–52.0)
Hemoglobin: 14.6 g/dL (ref 13.0–17.0)
Immature Granulocytes: 0 %
Lymphocytes Relative: 21 %
Lymphs Abs: 2.3 10*3/uL (ref 0.7–4.0)
MCH: 35.1 pg — ABNORMAL HIGH (ref 26.0–34.0)
MCHC: 35.2 g/dL (ref 30.0–36.0)
MCV: 99.8 fL (ref 80.0–100.0)
Monocytes Absolute: 0.5 10*3/uL (ref 0.1–1.0)
Monocytes Relative: 4 %
Neutro Abs: 7.7 10*3/uL (ref 1.7–7.7)
Neutrophils Relative %: 73 %
Platelet Count: 278 10*3/uL (ref 150–400)
RBC: 4.16 MIL/uL — ABNORMAL LOW (ref 4.22–5.81)
RDW: 12.5 % (ref 11.5–15.5)
WBC Count: 10.8 10*3/uL — ABNORMAL HIGH (ref 4.0–10.5)
nRBC: 0 % (ref 0.0–0.2)

## 2023-03-15 MED ORDER — LEUPROLIDE ACETATE (3 MONTH) 22.5 MG ~~LOC~~ KIT
22.5000 mg | PACK | Freq: Once | SUBCUTANEOUS | Status: AC
  Administered 2023-03-15: 22.5 mg via SUBCUTANEOUS
  Filled 2023-03-15: qty 22.5

## 2023-03-15 NOTE — Progress Notes (Signed)
[facility] [phone]   [phone]  PROGRESS NOTE  Patient Care Team: Nelwyn Salisbury, MD as PCP - General (Family Medicine) Jens Som Madolyn Frieze, MD as PCP - Cardiology (Cardiology) Drema Dallas, DO as Consulting Physician (Neurology) Cherlyn Cushing, RN as Oncology Nurse Navigator  Hematological/Oncological History # Metastatic Prostate Cancer  06/21/2022: PSA 87.3 07/20/2022: RP lymphadenopathy, nodular prostate and 2 pulmonary nodules.  09/26/2022: TRUS biopsy showed adenocarcinoma of the prostate, Gleason Score 5+4 =9  10/27/2022: NM PSMA scan showed metastatic spread to lymph nodes/bones  10/2022: started Eligard 45 mg subcutaneous and Abiraterone 1000 mg PO daily with prednisone 5 mg PO daily.  11/16/2022: establish care with Dr. Leonides Schanz   Interval History:  [name] [name] 77 y.o. male with medical history significant for metastatic castrate sensitive prostate cancer who presents for a follow up visit. The patient's last visit was on 01/20/2023. In the interim since the last visit he is continued on Zytiga therapy as prescribed.  On exam today, [name] [name] is accompanied by his wife.  He reports he has been well overall in the interim since his last visit.  He reports his appetite is strong and he is eating well.  He an excellent Thanksgiving.  He reports his energy levels are "not where the used to be".  He reports his energy is about a 6 out of 10.  He reports that he is getting tired more easily.  He is taking his Zytiga and prednisone faithfully.  He reports he is having some sweats and hot flashes and sometimes his legs feel heavy at night.  He reports that sometimes he has to put his legs over the side of the bed or go outside in order to help deal with his hot flashes.  Otherwise he is not having any major side effects as a result of his treatment and is willing and able to proceed at this time.[name] He denies fevers, chills, sweats, shortness of breath, chest  pain, cough, nausea, vomiting or diarrhea. He has no other complaints. Full 10 point ROS is otherwise negative.  MEDICAL HISTORY:  Past Medical History:  Diagnosis Date   CAD (coronary artery disease)    Constipation    Diabetes mellitus without complication (HCC)    borderline/ pre diabetic   NSTEMI (non-ST elevated myocardial infarction) (HCC)    at [place]   Prostate cancer (HCC)    Stroke (HCC)    L side - 40 years ago    SURGICAL HISTORY: Past Surgical History:  Procedure Laterality Date   CARDIAC CATHETERIZATION     CATARACT EXTRACTION Bilateral    05/2021; 07/2021   CORONARY ARTERY BYPASS GRAFT     at [facility]    SOCIAL HISTORY: Social History   Socioeconomic History   Marital status: Married    Spouse name: Not on file   Number of children: 2   Years of education: Not on file   Highest education level: Master's degree (e.g., MA, MS, MEng, MEd, MSW, MBA)  Occupational History   Occupation: retired  Tobacco Use   Smoking status: Former    Current packs/day: 0.00    Types: Cigarettes    Quit date: 04/11/1968    Years since quitting: 54.9   Smokeless tobacco: Never   Tobacco comments:    quit in the 1970s  Vaping Use   Vaping status: Never Used  Substance and Sexual Activity   Alcohol use: Not Currently    Comment: Rare   Drug  use: No   Sexual activity: Not on file  Other Topics Concern   Not on file  Social History Narrative   Right handed   One story home   Drinks no caffeine   Social Determinants of Health   Financial Resource Strain: Low Risk  (10/11/2022)   Overall Financial Resource Strain (CARDIA)    Difficulty of Paying Living Expenses: Not hard at all  Food Insecurity: No Food Insecurity (10/11/2022)   Hunger Vital Sign    Worried About Running Out of Food in the Last Year: Never true    Ran Out of Food in the Last Year: Never true  Transportation Needs: No Transportation Needs (10/11/2022)   PRAPARE - Administrator, Civil Service  (Medical): No    Lack of Transportation (Non-Medical): No  Physical Activity: Unknown (10/11/2022)   Exercise Vital Sign    Days of Exercise per Week: 5 days    Minutes of Exercise per Session: Patient declined  Stress: No Stress Concern Present (10/11/2022)   [employer] of Occupational Health - Occupational Stress Questionnaire    Feeling of Stress : Only a little  Social Connections: Unknown (10/11/2022)   Social Connection and Isolation Panel [NHANES]    Frequency of Communication with Friends and Family: More than three times a week    Frequency of Social Gatherings with Friends and Family: Patient declined    Attends Religious Services: Patient declined    Database administrator or Organizations: No    Attends Engineer, structural: Not on file    Marital Status: Married  Catering manager Violence: Not on file    FAMILY HISTORY: Family History  Problem Relation Age of Onset   Dementia Mother        lived to 101 years   Liver disease Father    Diabetes Sister    Cirrhosis Brother    Cancer - Colon Neg Hx    Colon polyps Neg Hx    Rectal cancer Neg Hx    Esophageal cancer Neg Hx    Stomach cancer Neg Hx    Pancreatic cancer Neg Hx     ALLERGIES:  has No Known Allergies.  MEDICATIONS:  Current Outpatient Medications  Medication Sig Dispense Refill   abiraterone acetate (ZYTIGA) 250 MG tablet Take 4 tablets (1,000 mg total) by mouth daily. Take on an empty stomach 1 hour before or 2 hours after a meal 4 tablet 0   acetaminophen (TYLENOL) 500 MG tablet Take 1 tablet (500 mg total) by mouth every 6 (six) hours as needed. 30 tablet 0   aspirin EC 81 MG tablet Take 1 tablet (81 mg total) by mouth daily. Swallow whole. (Patient taking differently: Take 81 mg by mouth as needed. Swallow whole.) 30 tablet 0   calcium carbonate (TUMS EX) 750 MG chewable tablet Chew 0.5 tablets by mouth daily.     clopidogrel (PLAVIX) 75 MG tablet Take 1 tablet (75 mg total) by mouth  daily. 30 tablet 5   polyethylene glycol powder (GLYCOLAX/MIRALAX) 17 GM/SCOOP powder Take 17 g by mouth in the morning and at bedtime. 17 g 0   predniSONE (DELTASONE) 5 MG tablet Take 1 tablet (5 mg total) by mouth daily with breakfast. 1 tablet 0   No current facility-administered medications for this visit.    REVIEW OF SYSTEMS:   Constitutional: ( - ) fevers, ( - )  chills , ( - ) night sweats Eyes: ( - ) blurriness of  vision, ( - ) double vision, ( - ) watery eyes Ears, nose, mouth, throat, and face: ( - ) mucositis, ( - ) sore throat Respiratory: ( - ) cough, ( - ) dyspnea, ( - ) wheezes Cardiovascular: ( - ) palpitation, ( - ) chest discomfort, ( - ) lower extremity swelling Gastrointestinal:  ( - ) nausea, ( - ) heartburn, ( - ) change in bowel habits Skin: ( - ) abnormal skin rashes Lymphatics: ( - ) new lymphadenopathy, ( - ) easy bruising Neurological: ( - ) numbness, ( - ) tingling, ( - ) new weaknesses Behavioral/Psych: ( - ) mood change, ( - ) new changes  All other systems were reviewed with the patient and are negative.  PHYSICAL EXAMINATION:  Vitals:   03/15/23 1443  BP: (!) 124/58  Pulse: 74  Resp: 17  Temp: 97.9 F (36.6 C)  SpO2: 95%   Filed Weights   03/15/23 1443  Weight: 170 lb 3 oz (77.2 kg)    GENERAL: Well-appearing elderly African-American male, alert, no distress and comfortable SKIN: skin color, texture, turgor are normal, no rashes or significant lesions EYES: conjunctiva are pink and non-injected, sclera clear LUNGS: clear to auscultation and percussion with normal breathing effort HEART: regular rate & rhythm and no murmurs and no lower extremity edema Musculoskeletal: no cyanosis of digits and no clubbing  PSYCH: alert & oriented x 3, fluent speech NEURO: no focal motor/sensory deficits  LABORATORY DATA:  I have reviewed the data as listed    Latest Ref Rng & Units 03/15/2023    2:36 PM 01/20/2023    8:16 AM 12/15/2022    1:40 PM  CBC   WBC 4.0 - 10.5 K/uL 10.8  10.1  8.6   Hemoglobin 13.0 - 17.0 g/dL 16.1  09.6  04.5   Hematocrit 39.0 - 52.0 % 41.5  43.5  41.6   Platelets 150 - 400 K/uL 278  278  247        Latest Ref Rng & Units 03/15/2023    2:36 PM 01/20/2023    8:16 AM 12/15/2022    1:40 PM  CMP  Glucose 70 - 99 mg/dL 409  811  914   BUN 8 - 23 mg/dL 16  18  18    Creatinine 0.61 - 1.24 mg/dL 7.82  9.56  2.13   Sodium 135 - 145 mmol/L 137  140  140   Potassium 3.5 - 5.1 mmol/L 4.2  4.1  4.2   Chloride 98 - 111 mmol/L 102  104  105   CO2 22 - 32 mmol/L 28  30  29    Calcium 8.9 - 10.3 mg/dL 9.8  9.9  9.3   Total Protein 6.5 - 8.1 g/dL 6.9  7.1  6.7   Total Bilirubin <1.2 mg/dL 0.5  0.7  0.6   Alkaline Phos 38 - 126 U/L 67  66  78   AST 15 - 41 U/L 13  19  15    ALT 0 - 44 U/L 14  17  14     RADIOGRAPHIC STUDIES: No results found.  ASSESSMENT & PLAN [name] [name] 77 y.o. male with medical history significant for metastatic castrate sensitive prostate cancer who presents for a follow up visit.   After review of the labs, review of the records, and discussion with the patient the patients findings are most consistent with metastatic adenocarcinoma of the prostate, castrate sensitive.   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 -- Patient started Lupron and Zytiga 1000 mg p.o. daily with 5 mg prednisone in late July 2024. -- Diagnosis confirmed with prostate biopsy Gleason 9 with PSMA scan showing widespread metastasis to lymph nodes and bones. PLAN:  --Labs today reviewed. WBC 10.8, hemoglobin 14.6, MCV 99.8, platelets of 278, creatinine and LFTs normal.  --Last PSA level shows improvement to <0.1 (previously 1.1). Today's pending.  -- Continue Zytiga 1000 mg p.o. daily with prednisone 5 mg.   -- RTC in 3 months with labs, follow up visit, Eligard injection.    No orders of the defined types were placed in this  encounter.   All questions were answered. The patient knows to call the clinic with any problems, questions or concerns.  A total of more than 30 minutes were spent on this encounter with face-to-face time and non-face-to-face time, including preparing to see the patient, ordering tests and/or medications, counseling the patient and coordination of care as outlined above.   [name] Barns, MD Department of Hematology/Oncology [facility] Phone: [phone] Pager: [phone] Email: [email or web address]    03/15/2023 3:57 PM

## 2023-03-16 LAB — TESTOSTERONE: Testosterone: <3 ng/dL — ABNORMAL LOW (ref 264–916)

## 2023-03-16 LAB — PROSTATE-SPECIFIC AG, SERUM (LABCORP): Prostate Specific Ag, Serum: <0.1 ng/mL (ref 0.0–4.0)

## 2023-03-20 ENCOUNTER — Telehealth: Payer: Self-pay | Admitting: *Deleted

## 2023-03-20 NOTE — Telephone Encounter (Signed)
TCT patient regarding recent PSA results. No answer. Was able to leave vm message for pt to return this call @ [phone]

## 2023-03-20 NOTE — Telephone Encounter (Signed)
-----   Message from Ulysees Barns IV sent at 03/18/2023 10:52 AM EST ----- Please let Mr. Polio know his PSA is at <0.1. This is an excellent result. We will see him back as scheduled in March 2025. ----- Message ----- From: Leory Plowman, Lab In [place] Sent: 03/15/2023   2:48 PM EST To: Jaci Standard, MD

## 2023-03-21 ENCOUNTER — Telehealth: Payer: Self-pay | Admitting: *Deleted

## 2023-03-21 NOTE — Telephone Encounter (Signed)
Patient's wife called in response to message left yesterday. Provided Ms. Narvel with PSA results as directed by Dr,. Dorsey. She verbalized understanding of information.

## 2023-04-27 ENCOUNTER — Ambulatory Visit: Payer: Self-pay | Admitting: Family Medicine

## 2023-04-27 ENCOUNTER — Encounter: Payer: Self-pay | Admitting: Family Medicine

## 2023-04-27 NOTE — Telephone Encounter (Signed)
Left voicemail message to call back-placed in callback folder  Copied from CRM [id number]. Topic: Clinical - Medical Advice >> Apr 27, 2023 12:54 PM Isabell A wrote: Reason for CRM: Spouse states patient is experiencing flu like symptoms - headaches, stuffy nose, just not feeling well. Patient would like to know what he can take over the counter.

## 2023-04-27 NOTE — Telephone Encounter (Signed)
Copied from CRM [id number]. Topic: Clinical - Medical Advice >> Apr 27, 2023 12:54 PM Isabell A wrote: Reason for CRM: Spouse states patient is experiencing flu like symptoms - headaches, stuffy nose, just not feeling well. Patient would like to know what he can take over the counter.   Chief Complaint: Cold symptoms Symptoms: Nasal congestion, occasional yellow nasal discharge, slight cough at night, intermittent headaches Frequency: Constant  Pertinent Negatives: Patient denies fevers, difficulty breathing, sore throat, ear pain Disposition: [] ED /[] Urgent Care (no appt availability in office) / [] Appointment(In office/virtual)/ []  [facility]/ [x] Home Care/ [] Refused Recommended Disposition /[] [facility]/ []  Follow-up with PCP Additional Notes: Patient reports that he has been experiencing cold symptoms at home. He reports his symptoms are nasal congestion, cough at night, intermittent yellow nasal discharge, and intermittent headaches. Patient denies any fever, difficulty breathing, sore throat, or ear pain. Patient given home care advice and instructed to call back for new or worsening symptoms.      Reason for Disposition  Common cold with no complications  Answer Assessment - Initial Assessment Questions 1. ONSET: "When did the nasal discharge start?"      Today 2. AMOUNT: "How much discharge is there?"      When blowing nose  3. COUGH: "Do you have a cough?" If Yes, ask: "Describe the color of your sputum" (clear, white, yellow, green)     No 4. RESPIRATORY DISTRESS: "Describe your breathing."      No 5. FEVER: "Do you have a fever?" If Yes, ask: "What is your temperature, how was it measured, and when did it start?"     No 6. SEVERITY: "Overall, how bad are you feeling right now?" (e.g., doesn't interfere with normal activities, staying home from school/work, staying in bed)      "Once I get up and I'm moving around I feel alright" 7. OTHER SYMPTOMS: "Do  you have any other symptoms?" (e.g., sore throat, earache, wheezing, vomiting)     [place]ahce  Protocols used: Common Cold-A-AH

## 2023-04-28 NOTE — Telephone Encounter (Signed)
FYI Send a message to pt to call the office and schedule appointment

## 2023-05-01 ENCOUNTER — Encounter: Payer: Self-pay | Admitting: Hematology and Oncology

## 2023-05-02 ENCOUNTER — Telehealth: Payer: Self-pay | Admitting: *Deleted

## 2023-05-02 NOTE — Telephone Encounter (Signed)
Contacted patient/spouse with Dr. Derek Mound response to his MyChart question regarding holding his prednisone and Zytiga for a few days while driving on a trip. Per Dr. Leonides Schanz, ok for patient to miss 1-2 days. Ms. Fudala verbalized understanding.

## 2023-05-11 ENCOUNTER — Encounter: Payer: Self-pay | Admitting: Hematology and Oncology

## 2023-05-15 ENCOUNTER — Telehealth: Payer: Self-pay | Admitting: Hematology and Oncology

## 2023-05-16 ENCOUNTER — Inpatient Hospital Stay: Payer: Medicare PPO | Attending: Hematology and Oncology | Admitting: Physician Assistant

## 2023-05-16 DIAGNOSIS — C61 Malignant neoplasm of prostate: Secondary | ICD-10-CM | POA: Diagnosis not present

## 2023-05-16 DIAGNOSIS — C7951 Secondary malignant neoplasm of bone: Secondary | ICD-10-CM | POA: Diagnosis not present

## 2023-05-18 ENCOUNTER — Encounter: Payer: Self-pay | Admitting: Hematology and Oncology

## 2023-05-18 ENCOUNTER — Telehealth: Payer: Self-pay

## 2023-05-18 ENCOUNTER — Other Ambulatory Visit (HOSPITAL_COMMUNITY): Payer: Self-pay

## 2023-05-18 NOTE — Progress Notes (Signed)
 [facility] Telephone:([phone]) [phone]   [phone]  PROGRESS NOTE  I connected with [name] [name]  on 05/16/2023 by telephone visit and verified that I am speaking with the correct person using two identifiers.   I discussed the limitations, risks, security and privacy concerns of performing an evaluation and management service by telemedicine and the availability of in-person appointments. I also discussed with the patient that there may be a patient responsible charge related to this service. The patient expressed understanding and agreed to proceed.   Patient's location: Home Provider's location: Office  Patient Care Team: [name] LABOR, MD as PCP - General (Family Medicine) Pietro Redell RAMAN, MD as PCP - Cardiology (Cardiology) Skeet Juliene SAUNDERS, DO as Consulting Physician (Neurology) Vertell Pont, RN as Oncology Nurse Navigator  Hematological/Oncological History # Metastatic Prostate Cancer  06/21/2022: PSA 87.3 07/20/2022: RP lymphadenopathy, nodular prostate and 2 pulmonary nodules.  09/26/2022: TRUS biopsy showed adenocarcinoma of the prostate, Gleason Score 5+4 =9  10/27/2022: NM PSMA scan showed metastatic spread to lymph nodes/bones  10/2022: started Eligard  45 mg subcutaneous and Abiraterone  1000 mg PO daily with prednisone  5 mg PO daily.  11/16/2022: establish care with Dr. Federico   Interval History:  [name] [name] 78 y.o. male with medical history significant for metastatic castrate sensitive prostate cancer who presents for a follow up visit. The patient's last visit was on 03/15/2023. In the interim since the last visit he is continued on Zytiga  therapy as prescribed.  On exam today, [name] [name] reports that he is tolerating the Zytiga  with similar toxicities. He continues to brain fog that impacts his concentration. He took a treatment break a couple of weeks ago while taking a road trip. He said his symptoms improved while off Zytiga . In addition, he reports  having hot flashes mainly in the evening which requires him to change his sheets and clothing.His appetite is stable. He denies nausea, vomiting or abdominal pain. He has occasional episodes of constipation that is managed with miralax . He denies easy bruising or signs of active bleeding.He denies fevers, chills, sweats, shortness of breath, chest pain or cough.. He has no other complaints. Full 10 point ROS is otherwise negative.  MEDICAL HISTORY:  Past Medical History:  Diagnosis Date   CAD (coronary artery disease)    Constipation    Diabetes mellitus without complication (HCC)    borderline/ pre diabetic   NSTEMI (non-ST elevated myocardial infarction) (HCC)    at [place]   Prostate cancer (HCC)    Stroke (HCC)    L side - 40 years ago    SURGICAL HISTORY: Past Surgical History:  Procedure Laterality Date   CARDIAC CATHETERIZATION     CATARACT EXTRACTION Bilateral    05/2021; 07/2021   CORONARY ARTERY BYPASS GRAFT     at [facility]    SOCIAL HISTORY: Social History   Socioeconomic History   Marital status: Married    Spouse name: Not on file   Number of children: 2   Years of education: Not on file   Highest education level: Master's degree (e.g., MA, MS, MEng, MEd, MSW, MBA)  Occupational History   Occupation: retired  Tobacco Use   Smoking status: Former    Current packs/day: 0.00    Types: Cigarettes    Quit date: 04/11/1968    Years since quitting: 55.1   Smokeless tobacco: Never   Tobacco comments:    quit in the 1970s  Vaping Use   Vaping status: Never Used  Substance and Sexual Activity   Alcohol use: Not Currently    Comment: Rare   Drug use: No   Sexual activity: Not on file  Other Topics Concern   Not on file  Social History Narrative   Right handed   One story home   Drinks no caffeine   Social Drivers of Health   Financial Resource Strain: Low Risk  (10/11/2022)   Overall Financial Resource Strain (CARDIA)    Difficulty of Paying Living  Expenses: Not hard at all  Food Insecurity: No Food Insecurity (10/11/2022)   Hunger Vital Sign    Worried About Running Out of Food in the Last Year: Never true    Ran Out of Food in the Last Year: Never true  Transportation Needs: No Transportation Needs (10/11/2022)   PRAPARE - Administrator, Civil Service (Medical): No    Lack of Transportation (Non-Medical): No  Physical Activity: Unknown (10/11/2022)   Exercise Vital Sign    Days of Exercise per Week: 5 days    Minutes of Exercise per Session: Patient declined  Stress: No Stress Concern Present (10/11/2022)   [employer] of Occupational Health - Occupational Stress Questionnaire    Feeling of Stress : Only a little  Social Connections: Unknown (10/11/2022)   Social Connection and Isolation Panel [NHANES]    Frequency of Communication with Friends and Family: More than three times a week    Frequency of Social Gatherings with Friends and Family: Patient declined    Attends Religious Services: Patient declined    Database Administrator or Organizations: No    Attends Engineer, Structural: Not on file    Marital Status: Married  Catering Manager Violence: Not on file    FAMILY HISTORY: Family History  Problem Relation Age of Onset   Dementia Mother        lived to 101 years   Liver disease Father    Diabetes Sister    Cirrhosis Brother    Cancer - Colon Neg Hx    Colon polyps Neg Hx    Rectal cancer Neg Hx    Esophageal cancer Neg Hx    Stomach cancer Neg Hx    Pancreatic cancer Neg Hx     ALLERGIES:  has no known allergies.  MEDICATIONS:  Current Outpatient Medications  Medication Sig Dispense Refill   abiraterone  acetate (ZYTIGA ) 250 MG tablet Take 4 tablets (1,000 mg total) by mouth daily. Take on an empty stomach 1 hour before or 2 hours after a meal 4 tablet 0   acetaminophen  (TYLENOL ) 500 MG tablet Take 1 tablet (500 mg total) by mouth every 6 (six) hours as needed. 30 tablet 0   aspirin  EC  81 MG tablet Take 1 tablet (81 mg total) by mouth daily. Swallow whole. (Patient taking differently: Take 81 mg by mouth as needed. Swallow whole.) 30 tablet 0   calcium  carbonate (TUMS EX) 750 MG chewable tablet Chew 0.5 tablets by mouth daily.     clopidogrel  (PLAVIX ) 75 MG tablet Take 1 tablet (75 mg total) by mouth daily. 30 tablet 5   polyethylene glycol powder (GLYCOLAX /MIRALAX ) 17 GM/SCOOP powder Take 17 g by mouth in the morning and at bedtime. 17 g 0   predniSONE  (DELTASONE ) 5 MG tablet Take 1 tablet (5 mg total) by mouth daily with breakfast. 1 tablet 0   No current facility-administered medications for this visit.    REVIEW OF SYSTEMS:   Constitutional: ( - )  fevers, ( - )  chills , ( - ) night sweats Eyes: ( - ) blurriness of vision, ( - ) double vision, ( - ) watery eyes Ears, nose, mouth, throat, and face: ( - ) mucositis, ( - ) sore throat Respiratory: ( - ) cough, ( - ) dyspnea, ( - ) wheezes Cardiovascular: ( - ) palpitation, ( - ) chest discomfort, ( - ) lower extremity swelling Gastrointestinal:  ( - ) nausea, ( - ) heartburn, ( - ) change in bowel habits Skin: ( - ) abnormal skin rashes Lymphatics: ( - ) new lymphadenopathy, ( - ) easy bruising Neurological: ( - ) numbness, ( - ) tingling, ( - ) new weaknesses Behavioral/Psych: ( - ) mood change, ( - ) new changes  All other systems were reviewed with the patient and are negative.  PHYSICAL EXAMINATION: Exam not performed due to virtual visit.  LABORATORY DATA:  I have reviewed the data as listed    Latest Ref Rng & Units 03/15/2023    2:36 PM 01/20/2023    8:16 AM 12/15/2022    1:40 PM  CBC  WBC 4.0 - 10.5 K/uL 10.8  10.1  8.6   Hemoglobin 13.0 - 17.0 g/dL 85.3  85.3  85.7   Hematocrit 39.0 - 52.0 % 41.5  43.5  41.6   Platelets 150 - 400 K/uL 278  278  247        Latest Ref Rng & Units 03/15/2023    2:36 PM 01/20/2023    8:16 AM 12/15/2022    1:40 PM  CMP  Glucose 70 - 99 mg/dL 627  836  787   BUN 8 - 23  mg/dL 16  18  18    Creatinine 0.61 - 1.24 mg/dL 9.15  9.01  9.02   Sodium 135 - 145 mmol/L 137  140  140   Potassium 3.5 - 5.1 mmol/L 4.2  4.1  4.2   Chloride 98 - 111 mmol/L 102  104  105   CO2 22 - 32 mmol/L 28  30  29    Calcium  8.9 - 10.3 mg/dL 9.8  9.9  9.3   Total Protein 6.5 - 8.1 g/dL 6.9  7.1  6.7   Total Bilirubin <1.2 mg/dL 0.5  0.7  0.6   Alkaline Phos 38 - 126 U/L 67  66  78   AST 15 - 41 U/L 13  19  15    ALT 0 - 44 U/L 14  17  14     RADIOGRAPHIC STUDIES: No results found.  ASSESSMENT & PLAN [name] [name] 78 y.o. male with medical history significant for metastatic castrate sensitive prostate cancer who presents for a follow up visit.     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 -- Patient started Lupron  and Zytiga  1000 mg p.o. daily with 5 mg prednisone  in late July 2024. -- Diagnosis confirmed with prostate biopsy Gleason 9 with PSMA scan showing widespread metastasis to lymph nodes and bones. PLAN:  --Discussed patient's persistent toxicities including hot flashes and brain fog with Zytiga . Patient inquired about any treatment modifications to help with his symptoms. Dr. Federico suggested trying low dose Abiraterone  with food rather than standard dose.  --Patient would like to move forward with the dose change as mentioned above.  -- RTC in 4 weeks with labs, follow up visit, Eligard  injection.    No orders of  the defined types were placed in this encounter.   All questions were answered. The patient knows to call the clinic with any problems, questions or concerns.  A total of more than 30 minutes were spent on this encounter with non-face-to-face time, including preparing to see the patient, ordering tests and/or medications, counseling the patient and coordination of care as outlined above.   Johnston Police PA-C Dept of Hematology and Oncology [facility] Phone: [phone]     05/18/2023 10:15 AM  Resources:  Do TA, Nivia PM, Vu TH, [name] HK, Nguyen HQ, Nguyen LD, Nguyen HT, Van Nguyen C. Real-world Efficacy and Safety of Low-Dose Abiraterone  With Food and Standard-Dose Abiraterone  in [employer] Metastatic Hormone-Sensitive Prostate Cancer: A Retrospective Analysis. Clin Genitourin Cancer. 2024 Dec;22(6):102191. doi: 10.1016/j.clgc.2024.102191. Epub 2024 Aug 9. PMID: [id number].

## 2023-05-19 ENCOUNTER — Telehealth: Payer: Self-pay | Admitting: Physician Assistant

## 2023-05-19 ENCOUNTER — Telehealth: Payer: Self-pay | Admitting: *Deleted

## 2023-05-19 NOTE — Telephone Encounter (Signed)
 Received vm message from pt's wife about a possible change in his abiraterone  dosage for his prostate cancer that was discussed with Johnston Police, PA on 05/16/23. TCt pt's wife and spoke with her. Advised that I would have Johnston Police, PA call her back with Dr. Lafonda recommendation. She voiced understanding.

## 2023-05-19 NOTE — Telephone Encounter (Signed)
 Rescheduled appointments per provider on Pal. Patient is aware of the changes made and is active on MyChart.

## 2023-05-21 ENCOUNTER — Other Ambulatory Visit: Payer: Self-pay

## 2023-05-21 ENCOUNTER — Emergency Department (HOSPITAL_COMMUNITY): Payer: Medicare PPO

## 2023-05-21 ENCOUNTER — Inpatient Hospital Stay (HOSPITAL_COMMUNITY)
Admission: EM | Admit: 2023-05-21 | Discharge: 2023-05-23 | DRG: 064 | Disposition: A | Discharge status: Home / Self Care | Payer: Medicare PPO | Attending: Internal Medicine | Admitting: Internal Medicine

## 2023-05-21 ENCOUNTER — Encounter (HOSPITAL_COMMUNITY): Payer: Self-pay

## 2023-05-21 DIAGNOSIS — I6389 Other cerebral infarction: Secondary | ICD-10-CM | POA: Diagnosis not present

## 2023-05-21 DIAGNOSIS — Z7951 Long term (current) use of inhaled steroids: Secondary | ICD-10-CM

## 2023-05-21 DIAGNOSIS — Z87891 Personal history of nicotine dependence: Secondary | ICD-10-CM

## 2023-05-21 DIAGNOSIS — R931 Abnormal findings on diagnostic imaging of heart and coronary circulation: Secondary | ICD-10-CM | POA: Diagnosis present

## 2023-05-21 DIAGNOSIS — C779 Secondary and unspecified malignant neoplasm of lymph node, unspecified: Secondary | ICD-10-CM | POA: Diagnosis present

## 2023-05-21 DIAGNOSIS — I472 Ventricular tachycardia, unspecified: Secondary | ICD-10-CM | POA: Diagnosis not present

## 2023-05-21 DIAGNOSIS — Z79899 Other long term (current) drug therapy: Secondary | ICD-10-CM | POA: Diagnosis not present

## 2023-05-21 DIAGNOSIS — R29702 NIHSS score 2: Secondary | ICD-10-CM | POA: Diagnosis present

## 2023-05-21 DIAGNOSIS — G9389 Other specified disorders of brain: Secondary | ICD-10-CM | POA: Diagnosis present

## 2023-05-21 DIAGNOSIS — Z7901 Long term (current) use of anticoagulants: Secondary | ICD-10-CM | POA: Diagnosis not present

## 2023-05-21 DIAGNOSIS — I82492 Acute embolism and thrombosis of other specified deep vein of left lower extremity: Secondary | ICD-10-CM | POA: Diagnosis present

## 2023-05-21 DIAGNOSIS — C61 Malignant neoplasm of prostate: Secondary | ICD-10-CM | POA: Diagnosis not present

## 2023-05-21 DIAGNOSIS — E669 Obesity, unspecified: Secondary | ICD-10-CM | POA: Diagnosis present

## 2023-05-21 DIAGNOSIS — I82412 Acute embolism and thrombosis of left femoral vein: Secondary | ICD-10-CM | POA: Diagnosis present

## 2023-05-21 DIAGNOSIS — E119 Type 2 diabetes mellitus without complications: Secondary | ICD-10-CM

## 2023-05-21 DIAGNOSIS — Z7982 Long term (current) use of aspirin: Secondary | ICD-10-CM

## 2023-05-21 DIAGNOSIS — E1151 Type 2 diabetes mellitus with diabetic peripheral angiopathy without gangrene: Secondary | ICD-10-CM | POA: Diagnosis present

## 2023-05-21 DIAGNOSIS — I5021 Acute systolic (congestive) heart failure: Secondary | ICD-10-CM | POA: Diagnosis not present

## 2023-05-21 DIAGNOSIS — C7951 Secondary malignant neoplasm of bone: Secondary | ICD-10-CM | POA: Diagnosis not present

## 2023-05-21 DIAGNOSIS — I2510 Atherosclerotic heart disease of native coronary artery without angina pectoris: Secondary | ICD-10-CM | POA: Diagnosis present

## 2023-05-21 DIAGNOSIS — I6340 Cerebral infarction due to embolism of unspecified cerebral artery: Secondary | ICD-10-CM | POA: Diagnosis not present

## 2023-05-21 DIAGNOSIS — Z7952 Long term (current) use of systemic steroids: Secondary | ICD-10-CM

## 2023-05-21 DIAGNOSIS — R29818 Other symptoms and signs involving the nervous system: Secondary | ICD-10-CM | POA: Diagnosis not present

## 2023-05-21 DIAGNOSIS — R4781 Slurred speech: Secondary | ICD-10-CM | POA: Diagnosis not present

## 2023-05-21 DIAGNOSIS — I639 Cerebral infarction, unspecified: Principal | ICD-10-CM | POA: Diagnosis present

## 2023-05-21 DIAGNOSIS — R4701 Aphasia: Secondary | ICD-10-CM | POA: Diagnosis present

## 2023-05-21 DIAGNOSIS — E1165 Type 2 diabetes mellitus with hyperglycemia: Secondary | ICD-10-CM | POA: Diagnosis present

## 2023-05-21 DIAGNOSIS — D6869 Other thrombophilia: Secondary | ICD-10-CM | POA: Diagnosis present

## 2023-05-21 DIAGNOSIS — E785 Hyperlipidemia, unspecified: Secondary | ICD-10-CM | POA: Diagnosis present

## 2023-05-21 DIAGNOSIS — I63512 Cerebral infarction due to unspecified occlusion or stenosis of left middle cerebral artery: Secondary | ICD-10-CM | POA: Diagnosis not present

## 2023-05-21 DIAGNOSIS — I110 Hypertensive heart disease with heart failure: Secondary | ICD-10-CM | POA: Diagnosis present

## 2023-05-21 DIAGNOSIS — I252 Old myocardial infarction: Secondary | ICD-10-CM

## 2023-05-21 DIAGNOSIS — Z66 Do not resuscitate: Secondary | ICD-10-CM | POA: Diagnosis not present

## 2023-05-21 DIAGNOSIS — I700 Atherosclerosis of aorta: Secondary | ICD-10-CM | POA: Diagnosis not present

## 2023-05-21 DIAGNOSIS — Z833 Family history of diabetes mellitus: Secondary | ICD-10-CM

## 2023-05-21 DIAGNOSIS — I6523 Occlusion and stenosis of bilateral carotid arteries: Secondary | ICD-10-CM | POA: Diagnosis not present

## 2023-05-21 DIAGNOSIS — Z7902 Long term (current) use of antithrombotics/antiplatelets: Secondary | ICD-10-CM

## 2023-05-21 DIAGNOSIS — Z951 Presence of aortocoronary bypass graft: Secondary | ICD-10-CM

## 2023-05-21 DIAGNOSIS — Z8673 Personal history of transient ischemic attack (TIA), and cerebral infarction without residual deficits: Secondary | ICD-10-CM

## 2023-05-21 LAB — I-STAT CHEM 8, ED
BUN: 20 mg/dL (ref 8–23)
BUN: 13 mg/dL (ref 8–23)
Calcium, Ion: 1.06 mmol/L — ABNORMAL LOW (ref 1.15–1.40)
Calcium, Ion: 1.14 mmol/L — ABNORMAL LOW (ref 1.15–1.40)
Chloride: 105 mmol/L (ref 98–111)
Chloride: 105 mmol/L (ref 98–111)
Creatinine, Ser: 0.8 mg/dL (ref 0.61–1.24)
Creatinine, Ser: 0.7 mg/dL (ref 0.61–1.24)
Glucose, Bld: 204 mg/dL — ABNORMAL HIGH (ref 70–99)
Glucose, Bld: 174 mg/dL — ABNORMAL HIGH (ref 70–99)
HCT: 47 % (ref 39.0–52.0)
HCT: 42 % (ref 39.0–52.0)
Hemoglobin: 16 g/dL (ref 13.0–17.0)
Hemoglobin: 14.3 g/dL (ref 13.0–17.0)
Potassium: 4.3 mmol/L (ref 3.5–5.1)
Potassium: 3.7 mmol/L (ref 3.5–5.1)
Sodium: 139 mmol/L (ref 135–145)
Sodium: 140 mmol/L (ref 135–145)
TCO2: 29 mmol/L (ref 22–32)
TCO2: 26 mmol/L (ref 22–32)

## 2023-05-21 LAB — COMPREHENSIVE METABOLIC PANEL
ALT: 18 U/L (ref 0–44)
AST: 24 U/L (ref 15–41)
Albumin: 3.6 g/dL (ref 3.5–5.0)
Alkaline Phosphatase: 64 U/L (ref 38–126)
Anion gap: 18 — ABNORMAL HIGH (ref 5–15)
BUN: 15 mg/dL (ref 8–23)
CO2: 19 mmol/L — ABNORMAL LOW (ref 22–32)
Calcium: 9.4 mg/dL (ref 8.9–10.3)
Chloride: 103 mmol/L (ref 98–111)
Creatinine, Ser: 0.9 mg/dL (ref 0.61–1.24)
GFR, Estimated: >60 mL/min (ref >60)
Glucose, Bld: 202 mg/dL — ABNORMAL HIGH (ref 70–99)
Potassium: 4.3 mmol/L (ref 3.5–5.1)
Sodium: 140 mmol/L (ref 135–145)
Total Bilirubin: 1.5 mg/dL — ABNORMAL HIGH (ref 0.0–1.2)
Total Protein: 6.5 g/dL (ref 6.5–8.1)

## 2023-05-21 LAB — I-STAT VENOUS BLOOD GAS, ED
Acid-Base Excess: 1 mmol/L (ref 0.0–2.0)
Bicarbonate: 25.7 mmol/L (ref 20.0–28.0)
Calcium, Ion: 1.16 mmol/L (ref 1.15–1.40)
HCT: 42 % (ref 39.0–52.0)
Hemoglobin: 14.3 g/dL (ref 13.0–17.0)
O2 Saturation: 72 %
Potassium: 3.6 mmol/L (ref 3.5–5.1)
Sodium: 140 mmol/L (ref 135–145)
TCO2: 27 mmol/L (ref 22–32)
pCO2, Ven: 42.4 mm[Hg] — ABNORMAL LOW (ref 44–60)
pH, Ven: 7.391 (ref 7.25–7.43)
pO2, Ven: 39 mm[Hg] (ref 32–45)

## 2023-05-21 LAB — CBC
HCT: 46.4 % (ref 39.0–52.0)
Hemoglobin: 15.5 g/dL (ref 13.0–17.0)
MCH: 34.4 pg — ABNORMAL HIGH (ref 26.0–34.0)
MCHC: 33.4 g/dL (ref 30.0–36.0)
MCV: 102.9 fL — ABNORMAL HIGH (ref 80.0–100.0)
Platelets: 299 10*3/uL (ref 150–400)
RBC: 4.51 MIL/uL (ref 4.22–5.81)
RDW: 12.6 % (ref 11.5–15.5)
WBC: 9.1 10*3/uL (ref 4.0–10.5)
nRBC: 0 % (ref 0.0–0.2)

## 2023-05-21 LAB — DIFFERENTIAL
Abs Immature Granulocytes: 0.02 10*3/uL (ref 0.00–0.07)
Basophils Absolute: 0 10*3/uL (ref 0.0–0.1)
Basophils Relative: 0 %
Eosinophils Absolute: 0.4 10*3/uL (ref 0.0–0.5)
Eosinophils Relative: 4 %
Immature Granulocytes: 0 %
Lymphocytes Relative: 43 %
Lymphs Abs: 3.9 10*3/uL (ref 0.7–4.0)
Monocytes Absolute: 0.8 10*3/uL (ref 0.1–1.0)
Monocytes Relative: 8 %
Neutro Abs: 4 10*3/uL (ref 1.7–7.7)
Neutrophils Relative %: 45 %

## 2023-05-21 LAB — HEMOGLOBIN A1C
Hgb A1c MFr Bld: 11.5 % — ABNORMAL HIGH (ref 4.8–5.6)
Mean Plasma Glucose: 283.35 mg/dL

## 2023-05-21 LAB — PROTIME-INR
INR: 1 (ref 0.8–1.2)
Prothrombin Time: 13.2 s (ref 11.4–15.2)

## 2023-05-21 LAB — ETHANOL: Alcohol, Ethyl (B): <10 mg/dL (ref <10)

## 2023-05-21 LAB — BETA-HYDROXYBUTYRIC ACID: Beta-Hydroxybutyric Acid: 0.17 mmol/L (ref 0.05–0.27)

## 2023-05-21 LAB — CBG MONITORING, ED
Glucose-Capillary: 222 mg/dL — ABNORMAL HIGH (ref 70–99)
Glucose-Capillary: 132 mg/dL — ABNORMAL HIGH (ref 70–99)
Glucose-Capillary: 227 mg/dL — ABNORMAL HIGH (ref 70–99)

## 2023-05-21 LAB — APTT: aPTT: 25 s (ref 24–36)

## 2023-05-21 MED ORDER — ATORVASTATIN CALCIUM 40 MG PO TABS
40.0000 mg | ORAL_TABLET | Freq: Every day | ORAL | Status: DC
  Administered 2023-05-22 – 2023-05-23 (×2): 40 mg via ORAL
  Filled 2023-05-21 (×3): qty 1

## 2023-05-21 MED ORDER — ENOXAPARIN SODIUM 40 MG/0.4ML IJ SOSY
40.0000 mg | PREFILLED_SYRINGE | INTRAMUSCULAR | Status: DC
  Administered 2023-05-22: 40 mg via SUBCUTANEOUS
  Filled 2023-05-21: qty 0.4

## 2023-05-21 MED ORDER — ABIRATERONE ACETATE 250 MG PO TABS
250.0000 mg | ORAL_TABLET | Freq: Every day | ORAL | Status: DC
  Administered 2023-05-22 – 2023-05-23 (×2): 250 mg via ORAL

## 2023-05-21 MED ORDER — INSULIN ASPART 100 UNIT/ML IJ SOLN
0.0000 [IU] | Freq: Three times a day (TID) | INTRAMUSCULAR | Status: DC
  Administered 2023-05-22: 2 [IU] via SUBCUTANEOUS
  Administered 2023-05-22: 8 [IU] via SUBCUTANEOUS
  Administered 2023-05-23 (×2): 3 [IU] via SUBCUTANEOUS

## 2023-05-21 MED ORDER — ASPIRIN 81 MG PO TBEC: 81.0000 mg | DELAYED_RELEASE_TABLET | ORAL | Status: DC

## 2023-05-21 MED ORDER — ACETAMINOPHEN 325 MG PO TABS
650.0000 mg | ORAL_TABLET | ORAL | Status: DC | PRN
  Administered 2023-05-23: 650 mg via ORAL
  Filled 2023-05-21: qty 2

## 2023-05-21 MED ORDER — INSULIN ASPART 100 UNIT/ML IJ SOLN
0.0000 [IU] | Freq: Every day | INTRAMUSCULAR | Status: DC
  Administered 2023-05-21 – 2023-05-22 (×2): 5 [IU] via SUBCUTANEOUS

## 2023-05-21 MED ORDER — ACETAMINOPHEN 650 MG RE SUPP: 650.0000 mg | RECTAL | Status: DC | PRN

## 2023-05-21 MED ORDER — INSULIN GLARGINE-YFGN 100 UNIT/ML ~~LOC~~ SOLN
10.0000 [IU] | Freq: Every day | SUBCUTANEOUS | Status: DC
  Administered 2023-05-23: 10 [IU] via SUBCUTANEOUS
  Filled 2023-05-21 (×3): qty 0.1

## 2023-05-21 MED ORDER — CLOPIDOGREL BISULFATE 75 MG PO TABS
75.0000 mg | ORAL_TABLET | Freq: Every day | ORAL | Status: DC
  Administered 2023-05-21 – 2023-05-22 (×2): 75 mg via ORAL
  Filled 2023-05-21 (×2): qty 1

## 2023-05-21 MED ORDER — SODIUM CHLORIDE 0.9 % IV BOLUS
1000.0000 mL | Freq: Once | INTRAVENOUS | Status: AC
  Administered 2023-05-21: 1000 mL via INTRAVENOUS

## 2023-05-21 MED ORDER — IOHEXOL 350 MG/ML SOLN
75.0000 mL | Freq: Once | INTRAVENOUS | Status: AC | PRN
  Administered 2023-05-21: 75 mL via INTRAVENOUS

## 2023-05-21 MED ORDER — PREDNISONE 5 MG PO TABS
5.0000 mg | ORAL_TABLET | Freq: Every day | ORAL | Status: DC
  Administered 2023-05-22 – 2023-05-23 (×2): 5 mg via ORAL
  Filled 2023-05-21 (×2): qty 1

## 2023-05-21 MED ORDER — STROKE: EARLY STAGES OF RECOVERY BOOK: Freq: Once | Status: DC

## 2023-05-21 MED ORDER — ACETAMINOPHEN 160 MG/5ML PO SOLN: 650.0000 mg | ORAL | Status: DC | PRN

## 2023-05-21 MED ORDER — SENNOSIDES-DOCUSATE SODIUM 8.6-50 MG PO TABS
1.0000 | ORAL_TABLET | Freq: Every evening | ORAL | Status: DC | PRN
  Administered 2023-05-22: 1 via ORAL
  Filled 2023-05-21: qty 1

## 2023-05-21 MED ORDER — SODIUM CHLORIDE 0.9% FLUSH
3.0000 mL | Freq: Once | INTRAVENOUS | Status: AC
  Administered 2023-05-21: 3 mL via INTRAVENOUS

## 2023-05-21 MED ORDER — PANTOPRAZOLE SODIUM 20 MG PO TBEC
20.0000 mg | DELAYED_RELEASE_TABLET | Freq: Every day | ORAL | Status: DC
  Administered 2023-05-21 – 2023-05-23 (×3): 20 mg via ORAL
  Filled 2023-05-21 (×3): qty 1

## 2023-05-21 NOTE — ED Provider Notes (Signed)
 [place] EMERGENCY DEPARTMENT AT [facility] Provider Note   CSN: [id number] Arrival date & time: 05/21/23  1205  An emergency department physician performed an initial assessment on this suspected stroke patient at 1220.  History  Chief Complaint  Patient presents with   Code Stroke    [name] [name] is a 78 y.o. male.  Present to the emergency department for confusion and slurred speech.  Last known normal 10 AM.  History of prior strokes.        Home Medications Prior to Admission medications   Medication Sig Start Date End Date Taking? Authorizing Provider  abiraterone  acetate (ZYTIGA ) 250 MG tablet Take 4 tablets (1,000 mg total) by mouth daily. Take on an empty stomach 1 hour before or 2 hours after a meal 10/19/22   [name] LABOR, MD  acetaminophen  (TYLENOL ) 500 MG tablet Take 1 tablet (500 mg total) by mouth every 6 (six) hours as needed. 06/03/22   Charlyn Sora, MD  aspirin  EC 81 MG tablet Take 1 tablet (81 mg total) by mouth daily. Swallow whole. Patient taking differently: Take 81 mg by mouth as needed. Swallow whole. 07/14/22   [name] LABOR, MD  calcium  carbonate (TUMS EX) 750 MG chewable tablet Chew 0.5 tablets by mouth daily.    [provider]  clopidogrel  (PLAVIX ) 75 MG tablet Take 1 tablet (75 mg total) by mouth daily. 04/25/22   Skeet Juliene SAUNDERS, DO  polyethylene glycol powder (GLYCOLAX /MIRALAX ) 17 GM/SCOOP powder Take 17 g by mouth in the morning and at bedtime. 08/02/22   Beather Delon Gibson, PA  predniSONE  (DELTASONE ) 5 MG tablet Take 1 tablet (5 mg total) by mouth daily with breakfast. 10/19/22   [name] LABOR, MD      Allergies    Patient has no known allergies.    Review of Systems   Review of Systems  Physical Exam Updated Vital Signs BP (!) 142/73   Pulse (!) 59   Temp (!) 97.5 F (36.4 C) (Oral)   Resp 16   SpO2 100%  Physical Exam Vitals and nursing note reviewed.  Constitutional:      General: He is not in acute  distress.    Appearance: He is not toxic-appearing.  HENT:     Head: Normocephalic.  Eyes:     Conjunctiva/sclera: Conjunctivae normal.  Cardiovascular:     Rate and Rhythm: Normal rate.  Pulmonary:     Effort: Pulmonary effort is normal.  Abdominal:     General: Abdomen is flat.  Skin:    Capillary Refill: Capillary refill takes less than 2 seconds.  Neurological:     Mental Status: He is alert.     Cranial Nerves: No cranial nerve deficit.     Sensory: No sensory deficit.     Motor: No weakness.     Coordination: Coordination normal.     Comments: Patient with word finding difficulty and difficulty naming objects.      ED Results / Procedures / Treatments   Labs (all labs ordered are listed, but only abnormal results are displayed) Labs Reviewed  CBC - Abnormal; Notable for the following components:      Result Value   MCV 102.9 (*)    MCH 34.4 (*)    All other components within normal limits  COMPREHENSIVE METABOLIC PANEL - Abnormal; Notable for the following components:   CO2 19 (*)    Glucose, Bld 202 (*)    Total Bilirubin 1.5 (*)  Anion gap 18 (*)    All other components within normal limits  CBG MONITORING, ED - Abnormal; Notable for the following components:   Glucose-Capillary 222 (*)    All other components within normal limits  I-STAT CHEM 8, ED - Abnormal; Notable for the following components:   Glucose, Bld 204 (*)    Calcium , Ion 1.06 (*)    All other components within normal limits  PROTIME-INR  APTT  DIFFERENTIAL  ETHANOL  HEMOGLOBIN A1C  CBG MONITORING, ED  I-STAT VENOUS BLOOD GAS, ED    EKG EKG Interpretation Date/Time:  Sunday May 21 2023 12:46:19 EST Ventricular Rate:  58 PR Interval:  159 QRS Duration:  85 QT Interval:  433 QTC Calculation: 426 R Axis:   58  Text Interpretation: Sinus rhythm Probable left atrial enlargement Nonspecific T abnrm, anterolateral leads Confirmed by Neysa Clap [phone]) on 05/21/2023 1:13:53  PM  Radiology CT HEAD CODE STROKE WO CONTRAST Result Date: 05/21/2023 CLINICAL DATA:  Code stroke. Neuro deficit, acute, stroke suspected. Personal history of prostate cancer. EXAM: CT HEAD WITHOUT CONTRAST TECHNIQUE: Contiguous axial images were obtained from the base of the skull through the vertex without intravenous contrast. RADIATION DOSE REDUCTION: This exam was performed according to the departmental dose-optimization program which includes automated exposure control, adjustment of the mA and/or kV according to patient size and/or use of iterative reconstruction technique. COMPARISON:  CT head without contrast and MR head without and with contrast 10/06/2021 FINDINGS: Brain: Remote infarcts of the high left frontal and parietal lobes demonstrate expected evolution. New areas of cortical hypoattenuation are present in the left frontal operculum just posterior to the more remote infarct. No acute hemorrhage or mass lesion is present. Basal ganglia are intact. The insular cortex is normal. The brainstem and cerebellum are within normal limits. Midline structures are within normal limits. Vascular: Atherosclerotic calcifications are present within the cavernous internal carotid arteries bilaterally. No hyperdense vessel is present. Skull: Calvarium is intact. No focal lytic or blastic lesions are present. No significant extracranial soft tissue lesion is present. Sinuses/Orbits: The paranasal sinuses and mastoid air cells are clear. The globes and orbits are within normal limits. Other: ASPECTS (Alberta Stroke Program Early CT Score) - Ganglionic level infarction (caudate, lentiform nuclei, internal capsule, insula, M1-M3 cortex): 2/3 - Supraganglionic infarction (M4-M6 cortex): 7/7 Total score (0-10 with 10 being normal): 9/10 IMPRESSION: 1. New areas of cortical hypoattenuation in the left frontal operculum just posterior to the more remote infarct. This likely represents acute or subacute infarct. 2.  Remote infarcts of the high left frontal and parietal lobes demonstrate expected evolution. 3. Aspects is 9/10. The above was relayed via text pager to Dr. MERRIANNE on 05/21/2023 at 12:46 . Electronically Signed   By: Lonni Necessary M.D.   On: 05/21/2023 12:46    Procedures Procedures    Medications Ordered in ED Medications  sodium chloride  flush (NS) 0.9 % injection 3 mL (has no administration in time range)   stroke: early stages of recovery book (has no administration in time range)  sodium chloride  0.9 % bolus 1,000 mL (has no administration in time range)    ED Course/ Medical Decision Making/ A&P Clinical Course as of 05/21/23 1417  Sun May 21, 2023  1415 Comprehensive metabolic panel(!) No significant metabolic derangements.  Does have elevated glucose and mild gap.  Will get VBG.  Given IV fluids [TY]  1416 CBC(!) No leukocytosis to suggest systemic infection. [TY]  1416 Protime-INR Normal [TY]  1416 Alcohol, Ethyl (B): <10 Unlikely intoxication [TY]  2455 78 year old male presenting with confusion, slurred speech.  Acute/subacute stroke seen on CT head.  Neurology recommending CTA/MRI and admission for further stroke workup. [TY]    Clinical Course User Index [TY] Neysa Caron PARAS, DO                                 Medical Decision Making 78 year old male history of prior strokes, diabetes, obesity, CAD presenting emergency department for reported slurred speech and confusion.  Vital signs reassuring, not hypertensive.  Do not appreciate slurred speech, or localizing weakness.  Patient does have some cognitive slowing, difficulty finding words and naming objects.  Code stroke called by triage.  CT head on my independent interpretation without acute brain bleed.  Neurology at bedside evaluating patient.  Amount and/or Complexity of Data Reviewed Independent Historian:     Details: Wife present at bedside and noted the patient last known well 2/8 at 1 PM.  Developed  some agitation yesterday and generally worsening confusion/slurred speech this morning External Data Reviewed:     Details: Prior stroke per chart review Labs: ordered. Decision-making details documented in ED Course. Radiology: ordered.    Details: CT head with acute/subacute stroke  Risk Decision regarding hospitalization.         Final Clinical Impression(s) / ED Diagnoses Final diagnoses:  Cerebrovascular accident (CVA), unspecified mechanism [facility])    Rx / DC Orders ED Discharge Orders     None         Neysa Caron PARAS, DO 05/21/23 1417

## 2023-05-21 NOTE — Assessment & Plan Note (Signed)
 Patient presented with slurred/aphasic speech.  Now markedly improved although some neurologic deficit is persistent.  Noted to have acute/subacute infarction of the left middle frontal gyrus.  Patient passed swallow screen in front of me.  Started on cardiac healthy diet.  Patient will be resumed on home aspirin  as well as Plavix .  Add on atorvastatin .  Check lipid panel in the morning.  PT OT evaluation as well as speech therapy.  CT angiography exam has been completed but patient is pending review by radiology for any significant vascular stenosis.  Echo with bubble studies pending.  Stroke order set utilized for frequent NIHSS scoring.  Telemetry

## 2023-05-21 NOTE — Assessment & Plan Note (Signed)
 Prescribed by onc. C.w. prednisone  5. Stres dose steroids if needed.

## 2023-05-21 NOTE — ED Notes (Signed)
 Pt transported back to CT via stretcher with stroke RN.

## 2023-05-21 NOTE — ED Notes (Signed)
 Pt transported back to room from CT and MRI.

## 2023-05-21 NOTE — ED Notes (Signed)
 Pt transported to MRI

## 2023-05-21 NOTE — Code Documentation (Signed)
 Stroke Response Nurse Documentation Code Documentation  Ory Elting is a 78 y.o. male arriving to [facility]  via [employer] [facility] on 05-21-2023 with past medical hx of CAD, CABG, prostate CA. On aspirin  81 mg daily and clopidogrel  75 mg daily. Code stroke was activated by ED.   Patient from home where he was LKW at 0100 05/20/2023 when they went to bed. and now complaining of difficulty speaking.  Per his wife from the time he woke up yesterday about 0900he was agitated, she states that he was speaking yesterday but she couldn't follow his thought process. This morning when they woke up about 10am he would just look at her when she asked him a question.  She was concerned and brought him to the hospital.  Stroke team at the bedside on patient arrival. Labs drawn and patient cleared for CT by EDP. Patient to CT with team. NIHSS 3, see documentation for details and code stroke times. Patient with disoriented and Expressive aphasia  on exam. The following imaging was completed:  CT Head and CTA. Patient is not a candidate for IV Thrombolytic due to being outside TNK window. Patient is not a candidate for IR due to being outside of thrombolytic window.   Care Plan: NIHSS q 2 hours x 12 hours then q 4 hours.   Bedside handoff with ED RN Jordan.    Elvin Portland  Stroke Response RN

## 2023-05-21 NOTE — ED Triage Notes (Signed)
 Pt presents to ED from home accompanied by spouse who reports confusion and slurred speech. LKW 1000. Code stroke activated from triage, taken to CT scan where neurology met pt and primary RN.

## 2023-05-21 NOTE — ED Notes (Signed)
 Pt and spouse prefer to hold off on taking atorvastatin  and insulin  until they have discussed it with MD and patient is not normally on those meds. Explained the importance of taking ordered medications but patient and spouse continue to want to wait.

## 2023-05-21 NOTE — Assessment & Plan Note (Signed)
 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 -- Patient started Lupron  and Zytiga  1000 mg p.o. daily with 5 mg prednisone  in late July 2024. -- Diagnosis confirmed with prostate biopsy Gleason 9 with PSMA scan showing widespread metastasis to lymph nodes and bones. PLAN:  --Discussed patient's persistent toxicities including hot flashes and brain fog with Zytiga . Patient inquired about any treatment modifications to help with his symptoms. Dr. Federico suggested trying low dose Abiraterone  with food rather than standard dose.  --Patient would like to move forward with the dose change as mentioned above.  -- RTC in 4 weeks with labs, follow up visit, Eligard  injection.

## 2023-05-21 NOTE — Assessment & Plan Note (Addendum)
 This is chronically documented in the chart.  But patient does not seem to be on many medications for this.  Glucose currently 222.  Therefore to preserve neurologic tissue I will start the patient on insulin  sliding scale.  Consideration of chronic oral therapy after that.  Patient is noted to have bicarbonate of 19 and anion gap of 18.  It is possible although unlikely that the patient is in DKA.  I will check beta hydroxybutyrate.  Patient did receive saline bolus in the ER.  1000 mL.  I will start the patient on low-dose Lantus .

## 2023-05-21 NOTE — Consult Note (Signed)
 NEUROLOGY CONSULT NOTE   Date of service: May 21, 2023 Patient Name: [name] [name] MRN:  [id number] DOB:  Feb 05, 1946 Chief Complaint: confusion and slurred speech  Requesting Provider: Neysa Caron PARAS, DO  History of Present Illness  [name] [name] is a 78 y.o. male with hx of CAD, CABG, HTN, HLD, prior CVA, DM, PVD, prostate cancer who presents via private vehicle for acute onset of confusion and slurred speech. Code stroke activated by triage. LKW 2/8 @ 0100. NIHSS 2. Wife is at the bedside. He was last known to be normal prior to going to bed on 2/8 @ 0100. Wife states that yesterday when he woke up in the morning @ ~0900,  that he seemed to be off and was agitated most of the day and he was not making sense when he was talking; she was having a hard time following the conversation. This morning when they woke up @ ~1000 his speech was worse. He was then brought to the ED for evaluation.   CT head with new areas of cortical hypoattenuation in the left frontal operculum, aspects 9.  CTA head and neck ordered  LKW: 2/8 at 0100 Modified rankin score: 1-No significant post stroke disability and can perform usual duties with stroke symptoms IV Thrombolysis:  No: Outside of time window  EVT:  No: Outside of time window   NIHSS components Score: Comment  1a Level of Conscious 0[x]  1[]  2[]  3[]      1b LOC Questions 0[]  1[x]  2[]       1c LOC Commands 0[x]  1[]  2[]       2 Best Gaze 0[x]  1[]  2[]       3 Visual 0[x]  1[]  2[]  3[]      4 Facial Palsy 0[x]  1[]  2[]  3[]      5a Motor Arm - left 0[x]  1[]  2[]  3[]  4[]  UN[]    5b Motor Arm - Right 0[x]  1[]  2[]  3[]  4[]  UN[]    6a Motor Leg - Left 0[x]  1[]  2[]  3[]  4[]  UN[]    6b Motor Leg - Right 0[x]  1[]  2[]  3[]  4[]  UN[]    7 Limb Ataxia 0[x]  1[]  2[]  3[]  UN[]     8 Sensory 0[x]  1[]  2[]  UN[]      9 Best Language 0[]  1[x]  2[]  3[]      10 Dysarthria 0[x]  1[]  2[]  UN[]      11 Extinct. and Inattention 0[x]  1[]  2[]       TOTAL:  2      ROS  Comprehensive ROS Unable  to ascertain due to confusion    Past History   Past Medical History:  Diagnosis Date   CAD (coronary artery disease)    Constipation    Diabetes mellitus without complication (HCC)    borderline/ pre diabetic   NSTEMI (non-ST elevated myocardial infarction) (HCC)    at [place]   Prostate cancer (HCC)    Stroke (HCC)    L side - 40 years ago    Past Surgical History:  Procedure Laterality Date   CARDIAC CATHETERIZATION     CATARACT EXTRACTION Bilateral    05/2021; 07/2021   CORONARY ARTERY BYPASS GRAFT     at [facility]    Family History: Family History  Problem Relation Age of Onset   Dementia Mother        lived to 101 years   Liver disease Father    Diabetes Sister    Cirrhosis Brother    Cancer - Colon Neg Hx    Colon polyps Neg Hx    Rectal cancer  Neg Hx    Esophageal cancer Neg Hx    Stomach cancer Neg Hx    Pancreatic cancer Neg Hx     Social History  reports that he quit smoking about 55 years ago. His smoking use included cigarettes. He has never used smokeless tobacco. He reports that he does not currently use alcohol. He reports that he does not use drugs.  No Known Allergies  Medications   Current Facility-Administered Medications:    sodium chloride  flush (NS) 0.9 % injection 3 mL, 3 mL, Intravenous, Once, Young, Travis J, DO  Current Outpatient Medications:    abiraterone  acetate (ZYTIGA ) 250 MG tablet, Take 4 tablets (1,000 mg total) by mouth daily. Take on an empty stomach 1 hour before or 2 hours after a meal, Disp: 4 tablet, Rfl: 0   acetaminophen  (TYLENOL ) 500 MG tablet, Take 1 tablet (500 mg total) by mouth every 6 (six) hours as needed., Disp: 30 tablet, Rfl: 0   aspirin  EC 81 MG tablet, Take 1 tablet (81 mg total) by mouth daily. Swallow whole. (Patient taking differently: Take 81 mg by mouth as needed. Swallow whole.), Disp: 30 tablet, Rfl: 0   calcium  carbonate (TUMS EX) 750 MG chewable tablet, Chew 0.5 tablets by mouth daily., Disp: ,  Rfl:    clopidogrel  (PLAVIX ) 75 MG tablet, Take 1 tablet (75 mg total) by mouth daily., Disp: 30 tablet, Rfl: 5   polyethylene glycol powder (GLYCOLAX /MIRALAX ) 17 GM/SCOOP powder, Take 17 g by mouth in the morning and at bedtime., Disp: 17 g, Rfl: 0   predniSONE  (DELTASONE ) 5 MG tablet, Take 1 tablet (5 mg total) by mouth daily with breakfast., Disp: 1 tablet, Rfl: 0  Vitals   Vitals:   Jun 04, 2023 1213  BP: 133/81  Pulse: 67  Resp: 18  Temp: (!) 97.5 F (36.4 C)  TempSrc: Oral  SpO2: 97%    There is no height or weight on file to calculate BMI.  Physical Exam   Constitutional: Appears well-developed and well-nourished.  Psych: Affect appropriate to situation.  Eyes: No scleral injection.  HENT: No OP obstruction.  Head: Normocephalic.  Cardiovascular: Normal rate and regular rhythm.  Respiratory: Effort normal, non-labored breathing.  GI: Soft.  No distension. There is no tenderness.  Skin: WDI.   Neurologic Examination   Mental Status -  He is alert and awake. He is oriented to self, month and year. Stated wrong age and not aware to situation. He has prominent dysfluency with word finding difficulties and halting speech. No dysarthria. Has trouble with naming objects and reading. Cranial Nerves II - XII - II - Visual fields intact OU. III, IV, VI - Extraocular movements intact. V - Facial sensation intact bilaterally. VII - Facial movement intact bilaterally. VIII - Hearing & vestibular intact bilaterally. X - Palate elevates symmetrically. XI - Chin turning & shoulder shrug intact bilaterally. XII - Tongue protrusion intact. Motor Strength - The patient's strength was normal in all extremities and pronator drift was absent.  Bulk was normal and fasciculations were absent.   Motor Tone - Muscle tone was assessed at the neck and appendages and was normal Sensory - Light touch, temperature/pinprick were assessed and were symmetrical.   Coordination - The patient had normal  movements in the hands and feet with no ataxia or dysmetria.  Tremor was absent. Gait and Station - Deferred.  Labs/Imaging/Neurodiagnostic studies   CBC:  Recent Labs  Lab 06/04/2023 1222 04-Jun-2023 1227  WBC 9.1  --  NEUTROABS 4.0  --   HGB 15.5 16.0  HCT 46.4 47.0  MCV 102.9*  --   PLT 299  --    Basic Metabolic Panel:  Lab Results  Component Value Date   NA 139 05/21/2023   K 4.3 05/21/2023   CO2 28 03/15/2023   GLUCOSE 204 (H) 05/21/2023   BUN 20 05/21/2023   CREATININE 0.80 05/21/2023   CALCIUM  9.8 03/15/2023   GFRNONAA >60 03/15/2023   Lipid Panel:  Lab Results  Component Value Date   LDLCALC 134 (H) 10/06/2021   HgbA1c:  Lab Results  Component Value Date   HGBA1C 6.4 (A) 04/27/2022   Urine Drug Screen:     Component Value Date/Time   LABOPIA NONE DETECTED 10/06/2021 1354   COCAINSCRNUR NONE DETECTED 10/06/2021 1354   LABBENZ NONE DETECTED 10/06/2021 1354   AMPHETMU NONE DETECTED 10/06/2021 1354   THCU NONE DETECTED 10/06/2021 1354   LABBARB NONE DETECTED 10/06/2021 1354    Alcohol Level No results found for: [facility] INR  Lab Results  Component Value Date   INR 1.0 05/21/2023   APTT  Lab Results  Component Value Date   APTT 25 05/21/2023   CT Head without contrast (Personally reviewed): 1. New areas of cortical hypoattenuation in the left frontal operculum just posterior to the more remote infarct. This likely represents acute or subacute infarct. 2. Remote infarcts of the high left frontal and parietal lobes demonstrate expected evolution. 3. Aspects is 9/10.  CT angio Head and Neck with contrast (Personally reviewed): 1. 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 2. Moderate stenoses at the dural margin of both vertebral arteries and decreased size of the more distal basilar artery, likely reflecting decreased perfusion. 3. 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 4. Moderate  stenosis in the distal left P2 segment just proximal to the bifurcation. 5. Atherosclerotic changes at the carotid bifurcations bilaterally without significant stenosis. 6.  Aortic Atherosclerosis    ASSESSMENT  Xiomar Crompton is a 78 y.o. male with a PMHx of CAD, CABG, HTN, HLD, prior CVA, DM, PVD, prostate cancer who presents via private vehicle for acute onset of confusion and slurred speech - On exam he has expressive aphasia, manifesting as halting speech with trouble naming objects and reading. No facial droop, weakness, or ataxia  - CT head with old strokes and new areas of cortical hypoattenuation in the left frontal operculum - CTA head and neck with multifocal extracranial and intracranial stenoses, most consistent with atherosclerotic disease. - Impression: Subacute left frontal operculum ischemic stroke with associated expressive aphasia  RECOMMENDATIONS  - HgbA1c, fasting lipid panel - MRI of the brain without contrast - Frequent neuro checks - Echocardiogram - Start atorvastatin . Obtain baseline CK level - Prophylactic therapy- Continue home regimen of ASA and Plavix   - BP management per standard protocol. Out of the permissive HTN time window - Risk factor modification - Telemetry monitoring - PT consult, OT consult, Speech consult - Stroke team to follow in the morning  Addendum: MRI brain: 1. Acute/subacute infarct of the left middle frontal gyrus. 2. Remote encephalomalacia of the left superior frontal gyrus and left parietal lobe. 3. 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 4. Periventricular and subcortical T2 hyperintensities bilaterally are otherwise mildly advanced for age. This likely reflects the sequela of chronic microvascular ischemia.    ______________________________________________________________________   Karna Geralds DNP, ACNPC-AG  [facility]  I have seen and examined the patient. I have  formulated the assessment and  recommendations. 78 year old male presenting with acute expressive aphasia. Exam reveals expressive aphasia, which correlates with the new left frontal operculum stroke seen on CT. Recommendations as above.  Electronically signed: Dr. Jigar Zielke

## 2023-05-21 NOTE — H&P (Addendum)
 History and Physical    PatientSaathvik [name] FMW:[id number] DOB: 1945/07/09 DOA: 05/21/2023 DOS: the patient was seen and examined on 05/21/2023 PCP: [name] LABOR, MD  Patient coming from: Home  Chief Complaint:  Chief Complaint  Patient presents with   Code Stroke   HPI: [name] [name] is a 78 y.o. male with medical history significant of known prostate cancer, prior strokes, other medical history as listed below.  Patient seems to have been in his usual state of health till yesterday when he was felt to be agitated further history in this regard is hard to obtain although.  Both patient and wife are at the bedside.  Regardless starting this morning, patient was noted to have marked difficulty speaking, slurred speech as well as increased confusion prompting the patient to be brought to [facility], ER.  Last known well is yesterday evening.  Since his arrival here in the ER patient's speech has improved, although he still having some word finding difficulties.  Patient is more calm and is actually fully oriented on this encounter.  An MRI has been done which shows left middle frontal gyrus acute/subacute infarction.  Medical evaluation is sought.  No report of any trauma nausea vomiting diarrhea fever hypotension (syncope) etc.   Review of Systems: As mentioned in the history of present illness. All other systems reviewed and are negative. Past Medical History:  Diagnosis Date   CAD (coronary artery disease)    Constipation    Diabetes mellitus without complication (HCC)    borderline/ pre diabetic   NSTEMI (non-ST elevated myocardial infarction) (HCC)    at [place]   Prostate cancer (HCC)    Stroke (HCC)    L side - 40 years ago   Past Surgical History:  Procedure Laterality Date   CARDIAC CATHETERIZATION     CATARACT EXTRACTION Bilateral    05/2021; 07/2021   CORONARY ARTERY BYPASS GRAFT     at [facility]   Social History:  reports that he quit smoking about 55 years ago. His  smoking use included cigarettes. He has never used smokeless tobacco. He reports that he does not currently use alcohol. He reports that he does not use drugs.  No Known Allergies  Family History  Problem Relation Age of Onset   Dementia Mother        lived to 64 years   Liver disease Father    Diabetes Sister    Cirrhosis Brother    Cancer - Colon Neg Hx    Colon polyps Neg Hx    Rectal cancer Neg Hx    Esophageal cancer Neg Hx    Stomach cancer Neg Hx    Pancreatic cancer Neg Hx     Prior to Admission medications   Medication Sig Start Date End Date Taking? Authorizing Provider  abiraterone  acetate (ZYTIGA ) 250 MG tablet Take 4 tablets (1,000 mg total) by mouth daily. Take on an empty stomach 1 hour before or 2 hours after a meal Patient taking differently: Take 250 mg by mouth daily. Take on an empty stomach 1 hour before or 2 hours after a meal 10/19/22  Yes [name] LABOR, MD  acetaminophen  (TYLENOL ) 500 MG tablet Take 1 tablet (500 mg total) by mouth [name] 6 (six) hours as needed. 06/03/22  Yes Charlyn Sora, MD  aspirin  EC 81 MG tablet Take 1 tablet (81 mg total) by mouth daily. Swallow whole. Patient taking differently: Take 81 mg by mouth [name] other day. Swallow whole. 07/14/22  Yes [name] LABOR, MD  calcium  carbonate (TUMS EX) 750 MG chewable tablet Chew 0.5 tablets by mouth daily.   Yes [provider]  clopidogrel  (PLAVIX ) 75 MG tablet Take 1 tablet (75 mg total) by mouth daily. Patient taking differently: Take 75 mg by mouth [name] other day. 04/25/22  Yes Skeet, Adam R, DO  polyethylene glycol powder (GLYCOLAX /MIRALAX ) 17 GM/SCOOP powder Take 17 g by mouth in the morning and at bedtime. 08/02/22  Yes Beather Delon Gibson, PA  predniSONE  (DELTASONE ) 5 MG tablet Take 1 tablet (5 mg total) by mouth daily with breakfast. 10/19/22  Yes [name] LABOR, MD    Physical Exam: Vitals:   05/21/23 1245 05/21/23 1415 05/21/23 1430 05/21/23 1440  BP: (!) 167/70 (!) 142/73  128/75   Pulse: 63 (!) 59 (!) 55   Resp: 18 16 18    Temp:      TempSrc:      SpO2: 100% 100% 100%   Weight:    77.1 kg  Height:    5' 5 (1.651 m)   General: Patient is alert and awake, speech is slow I did not really appreciate any dysarthria or aphasia.  However wife at the bedside feels that this patient's speech is still slower compared to his usual baseline.  In this patient is pausing to find words.  But patient is fully oriented to location date month and time.  He is able to give me his name and date of birth Respiratory exam: Bilateral intravesicular Cardiovascular exam S1-S2 normal Abdomen all quadrant soft nontender Extremities warm without edema no focal motor deficit Symmetric facies, gazes are equal.  No facial palsy is noted. Data Reviewed:  Labs on Admission:  Results for orders placed or performed during the hospital encounter of 05/21/23 (from the past 24 hours)  CBG monitoring, ED     Status: Abnormal   Collection Time: 05/21/23 12:16 PM  Result Value Ref Range   Glucose-Capillary 222 (H) 70 - 99 mg/dL  Protime-INR     Status: None   Collection Time: 05/21/23 12:22 PM  Result Value Ref Range   Prothrombin Time 13.2 11.4 - 15.2 seconds   INR 1.0 0.8 - 1.2  APTT     Status: None   Collection Time: 05/21/23 12:22 PM  Result Value Ref Range   aPTT 25 24 - 36 seconds  CBC     Status: Abnormal   Collection Time: 05/21/23 12:22 PM  Result Value Ref Range   WBC 9.1 4.0 - 10.5 K/uL   RBC 4.51 4.22 - 5.81 MIL/uL   Hemoglobin 15.5 13.0 - 17.0 g/dL   HCT 53.5 60.9 - 47.9 %   MCV 102.9 (H) 80.0 - 100.0 fL   MCH 34.4 (H) 26.0 - 34.0 pg   MCHC 33.4 30.0 - 36.0 g/dL   RDW 87.3 88.4 - 84.4 %   Platelets 299 150 - 400 K/uL   nRBC 0.0 0.0 - 0.2 %  Differential     Status: None   Collection Time: 05/21/23 12:22 PM  Result Value Ref Range   Neutrophils Relative % 45 %   Neutro Abs 4.0 1.7 - 7.7 K/uL   Lymphocytes Relative 43 %   Lymphs Abs 3.9 0.7 - 4.0 K/uL    Monocytes Relative 8 %   Monocytes Absolute 0.8 0.1 - 1.0 K/uL   Eosinophils Relative 4 %   Eosinophils Absolute 0.4 0.0 - 0.5 K/uL   Basophils Relative 0 %   Basophils Absolute  0.0 0.0 - 0.1 K/uL   Immature Granulocytes 0 %   Abs Immature Granulocytes 0.02 0.00 - 0.07 K/uL  Comprehensive metabolic panel     Status: Abnormal   Collection Time: 05/21/23 12:22 PM  Result Value Ref Range   Sodium 140 135 - 145 mmol/L   Potassium 4.3 3.5 - 5.1 mmol/L   Chloride 103 98 - 111 mmol/L   CO2 19 (L) 22 - 32 mmol/L   Glucose, Bld 202 (H) 70 - 99 mg/dL   BUN 15 8 - 23 mg/dL   Creatinine, Ser 9.09 0.61 - 1.24 mg/dL   Calcium  9.4 8.9 - 10.3 mg/dL   Total Protein 6.5 6.5 - 8.1 g/dL   Albumin 3.6 3.5 - 5.0 g/dL   AST 24 15 - 41 U/L   ALT 18 0 - 44 U/L   Alkaline Phosphatase 64 38 - 126 U/L   Total Bilirubin 1.5 (H) 0.0 - 1.2 mg/dL   GFR, Estimated >39 >39 mL/min   Anion gap 18 (H) 5 - 15  Ethanol     Status: None   Collection Time: 05/21/23 12:22 PM  Result Value Ref Range   Alcohol, Ethyl (B) <10 <10 mg/dL  I-stat chem 8, ED     Status: Abnormal   Collection Time: 05/21/23 12:27 PM  Result Value Ref Range   Sodium 139 135 - 145 mmol/L   Potassium 4.3 3.5 - 5.1 mmol/L   Chloride 105 98 - 111 mmol/L   BUN 20 8 - 23 mg/dL   Creatinine, Ser 9.19 0.61 - 1.24 mg/dL   Glucose, Bld 795 (H) 70 - 99 mg/dL   Calcium , Ion 1.06 (L) 1.15 - 1.40 mmol/L   TCO2 29 22 - 32 mmol/L   Hemoglobin 16.0 13.0 - 17.0 g/dL   HCT 52.9 60.9 - 47.9 %   Basic Metabolic Panel: Recent Labs  Lab 05/21/23 1222 05/21/23 1227  NA 140 139  K 4.3 4.3  CL 103 105  CO2 19*  --   GLUCOSE 202* 204*  BUN 15 20  CREATININE 0.90 0.80  CALCIUM  9.4  --    Liver Function Tests: Recent Labs  Lab 05/21/23 1222  AST 24  ALT 18  ALKPHOS 64  BILITOT 1.5*  PROT 6.5  ALBUMIN 3.6   No results for input(s): LIPASE, AMYLASE in the last 168 hours. No results for input(s): AMMONIA in the last 168  hours. CBC: Recent Labs  Lab 05/21/23 1222 05/21/23 1227  WBC 9.1  --   NEUTROABS 4.0  --   HGB 15.5 16.0  HCT 46.4 47.0  MCV 102.9*  --   PLT 299  --    Cardiac Enzymes: No results for input(s): CKTOTAL, CKMB, CKMBINDEX, TROPONINIHS in the last 168 hours.  BNP (last 3 results) No results for input(s): PROBNP in the last 8760 hours. CBG: Recent Labs  Lab 05/21/23 1216  GLUCAP 222*    Radiological Exams on Admission:  MR BRAIN WO CONTRAST Result Date: 05/21/2023 CLINICAL DATA:  Stroke, follow-up. Acute onset of confusion and slurred speech. EXAM: MRI HEAD WITHOUT CONTRAST TECHNIQUE: Multiplanar, multiecho pulse sequences of the brain and surrounding structures were obtained without intravenous contrast. COMPARISON:  CT head without contrast 05/21/2023. MR head without and with contrast 10/06/2021. FINDINGS: Brain: The suspected acute infarct involving the left middle frontal gyrus is confirmed on diffusion-weighted images. T2 and FLAIR hyperintensities are associated with this infarct. The infarct is posterior to the previous left superior frontal gyrus infarct.  Remote encephalomalacia is present in the left parietal lobe. Periventricular and subcortical T2 hyperintensities are otherwise mildly advanced for age. The ventricles are of normal size. No significant extraaxial fluid collection is present. Remote lacunar infarcts are present in the right paramedian pons. The brainstem and cerebellum are otherwise within normal limits. Vascular: Flow is present in the major intracranial arteries. Skull and upper cervical spine: The craniocervical junction is normal. Upper cervical spine is within normal limits. Marrow signal is unremarkable. Sinuses/Orbits: Polyp or mucous retention cyst is present in the inferior left maxillary sinus. The paranasal sinuses and mastoid air cells are otherwise clear. Bilateral lens replacements are noted. Globes and orbits are otherwise unremarkable.  IMPRESSION: 1. Acute/subacute infarct of the left middle frontal gyrus. 2. Remote encephalomalacia of the left superior frontal gyrus and left parietal lobe. 3. 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 4. Periventricular and subcortical T2 hyperintensities bilaterally are otherwise mildly advanced for age. This likely reflects the sequela of chronic microvascular ischemia. Electronically Signed   By: Lonni Necessary M.D.   On: 05/21/2023 15:47   CT HEAD CODE STROKE WO CONTRAST Result Date: 05/21/2023 CLINICAL DATA:  Code stroke. Neuro deficit, acute, stroke suspected. Personal history of prostate cancer. EXAM: CT HEAD WITHOUT CONTRAST TECHNIQUE: Contiguous axial images were obtained from the base of the skull through the vertex without intravenous contrast. RADIATION DOSE REDUCTION: This exam was performed according to the departmental dose-optimization program which includes automated exposure control, adjustment of the mA and/or kV according to patient size and/or use of iterative reconstruction technique. COMPARISON:  CT head without contrast and MR head without and with contrast 10/06/2021 FINDINGS: Brain: Remote infarcts of the high left frontal and parietal lobes demonstrate expected evolution. New areas of cortical hypoattenuation are present in the left frontal operculum just posterior to the more remote infarct. No acute hemorrhage or mass lesion is present. Basal ganglia are intact. The insular cortex is normal. The brainstem and cerebellum are within normal limits. Midline structures are within normal limits. Vascular: Atherosclerotic calcifications are present within the cavernous internal carotid arteries bilaterally. No hyperdense vessel is present. Skull: Calvarium is intact. No focal lytic or blastic lesions are present. No significant extracranial soft tissue lesion is present. Sinuses/Orbits: The paranasal sinuses and mastoid air cells are clear. The globes and orbits are within  normal limits. Other: ASPECTS ([place] Stroke Program Early CT Score) - Ganglionic level infarction (caudate, lentiform nuclei, internal capsule, insula, M1-M3 cortex): 2/3 - Supraganglionic infarction (M4-M6 cortex): 7/7 Total score (0-10 with 10 being normal): 9/10 IMPRESSION: 1. New areas of cortical hypoattenuation in the left frontal operculum just posterior to the more remote infarct. This likely represents acute or subacute infarct. 2. Remote infarcts of the high left frontal and parietal lobes demonstrate expected evolution. 3. Aspects is 9/10. The above was relayed via text pager to Dr. MERRIANNE on 05/21/2023 at 12:46 . Electronically Signed   By: Lonni Necessary M.D.   On: 05/21/2023 12:46     EKG: Independently reviewed. NSR  No intake/output data recorded. No intake/output data recorded.       Assessment and Plan: * CVA (cerebral vascular accident) [facility]) Patient presented with slurred/aphasic speech.  Now markedly improved although some neurologic deficit is persistent.  Noted to have acute/subacute infarction of the left middle frontal gyrus.  Patient passed swallow screen in front of me.  Started on cardiac healthy diet.  Patient will be resumed on home aspirin  as well as Plavix .  Add  on atorvastatin .  Check lipid panel in the morning.  PT OT evaluation as well as speech therapy.  CT angiography exam has been completed but patient is pending review by radiology for any significant vascular stenosis.  Echo with bubble studies pending.  Stroke order set utilized for frequent NIHSS scoring.  Telemetry  Current chronic use of inhaled steroid Prescribed by onc. C.w. prednisone  5. Stres dose steroids if needed.  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 -- Patient started Lupron  and Zytiga  1000 mg p.o. daily with 5 mg prednisone  in  late July 2024. -- Diagnosis confirmed with prostate biopsy Gleason 9 with PSMA scan showing widespread metastasis to lymph nodes and bones. PLAN:  --Discussed patient's persistent toxicities including hot flashes and brain fog with Zytiga . Patient inquired about any treatment modifications to help with his symptoms. Dr. Federico suggested trying low dose Abiraterone  with food rather than standard dose.  --Patient would like to move forward with the dose change as mentioned above.  -- RTC in 4 weeks with labs, follow up visit, Eligard  injection.    DM (diabetes mellitus), type 2 (HCC) This is chronically documented in the chart.  But patient does not seem to be on many medications for this.  Glucose currently 222.  Therefore to preserve neurologic tissue I will start the patient on insulin  sliding scale.  Consideration of chronic oral therapy after that.  Patient is noted to have bicarbonate of 19 and anion gap of 18.  It is possible although unlikely that the patient is in DKA.  I will check beta hydroxybutyrate.  Patient did receive saline bolus in the ER.  1000 mL.  I will start the patient on low-dose Lantus .   DVT ppx with lovenox . Pending baseline inr and ptt.    Advance Care Planning:   Code Status: Full Code   Consults: neurology engaged.  Family Communication: wife at bedside all questions answered.  Severity of Illness: The appropriate patient status for this patient is INPATIENT. Inpatient status is judged to be reasonable and necessary in order to provide the required intensity of service to ensure the patient's safety. The patient's presenting symptoms, physical exam findings, and initial radiographic and laboratory data in the context of their chronic comorbidities is felt to place them at high risk for further clinical deterioration. Furthermore, it is not anticipated that the patient will be medically stable for discharge from the hospital within 2 midnights of admission.   * I  certify that at the point of admission it is my clinical judgment that the patient will require inpatient hospital care spanning beyond 2 midnights from the point of admission due to high intensity of service, high risk for further deterioration and high frequency of surveillance required.*  Author: Jacqulyn Divine, MD 05/21/2023 4:33 PM  For on call review [email or web address].

## 2023-05-22 ENCOUNTER — Other Ambulatory Visit (HOSPITAL_COMMUNITY): Payer: Self-pay

## 2023-05-22 ENCOUNTER — Telehealth: Payer: Self-pay | Admitting: *Deleted

## 2023-05-22 ENCOUNTER — Telehealth (HOSPITAL_COMMUNITY): Payer: Self-pay

## 2023-05-22 ENCOUNTER — Inpatient Hospital Stay (HOSPITAL_COMMUNITY): Payer: Medicare PPO

## 2023-05-22 DIAGNOSIS — I639 Cerebral infarction, unspecified: Secondary | ICD-10-CM

## 2023-05-22 DIAGNOSIS — R931 Abnormal findings on diagnostic imaging of heart and coronary circulation: Secondary | ICD-10-CM | POA: Diagnosis present

## 2023-05-22 DIAGNOSIS — Z006 Encounter for examination for normal comparison and control in clinical research program: Secondary | ICD-10-CM

## 2023-05-22 DIAGNOSIS — I6389 Other cerebral infarction: Secondary | ICD-10-CM

## 2023-05-22 DIAGNOSIS — Z66 Do not resuscitate: Secondary | ICD-10-CM | POA: Diagnosis present

## 2023-05-22 DIAGNOSIS — I82412 Acute embolism and thrombosis of left femoral vein: Secondary | ICD-10-CM | POA: Diagnosis present

## 2023-05-22 LAB — CBC WITH DIFFERENTIAL/PLATELET
Abs Immature Granulocytes: 0.03 10*3/uL (ref 0.00–0.07)
Basophils Absolute: 0.1 10*3/uL (ref 0.0–0.1)
Basophils Relative: 1 %
Eosinophils Absolute: 0.3 10*3/uL (ref 0.0–0.5)
Eosinophils Relative: 3 %
HCT: 41.5 % (ref 39.0–52.0)
Hemoglobin: 14.7 g/dL (ref 13.0–17.0)
Immature Granulocytes: 0 %
Lymphocytes Relative: 20 %
Lymphs Abs: 2 10*3/uL (ref 0.7–4.0)
MCH: 34.7 pg — ABNORMAL HIGH (ref 26.0–34.0)
MCHC: 35.4 g/dL (ref 30.0–36.0)
MCV: 97.9 fL (ref 80.0–100.0)
Monocytes Absolute: 0.5 10*3/uL (ref 0.1–1.0)
Monocytes Relative: 5 %
Neutro Abs: 7 10*3/uL (ref 1.7–7.7)
Neutrophils Relative %: 71 %
Platelets: 308 10*3/uL (ref 150–400)
RBC: 4.24 MIL/uL (ref 4.22–5.81)
RDW: 12.6 % (ref 11.5–15.5)
WBC: 9.9 10*3/uL (ref 4.0–10.5)
nRBC: 0 % (ref 0.0–0.2)

## 2023-05-22 LAB — ECHOCARDIOGRAM COMPLETE
Area-P 1/2: 5.42 cm2
Calc EF: 34.7 %
Height: 65 in
S' Lateral: 3.9 cm
Single Plane A2C EF: 46.8 %
Single Plane A4C EF: 35.3 %
Weight: 2720 [oz_av]

## 2023-05-22 LAB — BASIC METABOLIC PANEL
Anion gap: 11 (ref 5–15)
BUN: 11 mg/dL (ref 8–23)
CO2: 22 mmol/L (ref 22–32)
Calcium: 9 mg/dL (ref 8.9–10.3)
Chloride: 106 mmol/L (ref 98–111)
Creatinine, Ser: 0.84 mg/dL (ref 0.61–1.24)
GFR, Estimated: >60 mL/min (ref >60)
Glucose, Bld: 322 mg/dL — ABNORMAL HIGH (ref 70–99)
Potassium: 3.8 mmol/L (ref 3.5–5.1)
Sodium: 139 mmol/L (ref 135–145)

## 2023-05-22 LAB — CBG MONITORING, ED
Glucose-Capillary: 135 mg/dL — ABNORMAL HIGH (ref 70–99)
Glucose-Capillary: 276 mg/dL — ABNORMAL HIGH (ref 70–99)

## 2023-05-22 LAB — LIPID PANEL
Cholesterol: 200 mg/dL (ref 0–200)
HDL: 34 mg/dL — ABNORMAL LOW (ref >40)
LDL Cholesterol: 142 mg/dL — ABNORMAL HIGH (ref 0–99)
Total CHOL/HDL Ratio: 5.9 {ratio}
Triglycerides: 122 mg/dL (ref <150)
VLDL: 24 mg/dL (ref 0–40)

## 2023-05-22 LAB — GLUCOSE, CAPILLARY
Glucose-Capillary: 116 mg/dL — ABNORMAL HIGH (ref 70–99)
Glucose-Capillary: 212 mg/dL — ABNORMAL HIGH (ref 70–99)

## 2023-05-22 LAB — PROTIME-INR
INR: 1 (ref 0.8–1.2)
Prothrombin Time: 13.5 s (ref 11.4–15.2)

## 2023-05-22 LAB — APTT: aPTT: 23 s — ABNORMAL LOW (ref 24–36)

## 2023-05-22 MED ORDER — PERFLUTREN LIPID MICROSPHERE
1.0000 mL | INTRAVENOUS | Status: AC | PRN
  Administered 2023-05-22: 2 mL via INTRAVENOUS

## 2023-05-22 MED ORDER — APIXABAN 5 MG PO TABS
10.0000 mg | ORAL_TABLET | Freq: Two times a day (BID) | ORAL | Status: DC
  Administered 2023-05-22 – 2023-05-23 (×3): 10 mg via ORAL
  Filled 2023-05-22 (×3): qty 2

## 2023-05-22 MED ORDER — APIXABAN 5 MG PO TABS: 5.0000 mg | ORAL_TABLET | Freq: Two times a day (BID) | ORAL | Status: DC

## 2023-05-22 NOTE — Inpatient Diabetes Management (Addendum)
Inpatient Diabetes Program Recommendations  AACE/ADA: New Consensus Statement on Inpatient Glycemic Control (2015)  Target Ranges:  Prepandial:   less than 140 mg/dL      Peak postprandial:   less than 180 mg/dL (1-2 hours)      Critically ill patients:  140 - 180 mg/dL   Lab Results  Component Value Date   GLUCAP 276 (H) 05/22/2023   HGBA1C 11.5 (H) 05/21/2023    Review of Glycemic Control  Latest Reference Range & Units 05/21/23 12:16 05/21/23 17:14 05/21/23 23:01 05/22/23 07:28 05/22/23 11:51  Glucose-Capillary 70 - 99 mg/dL 409 (H) 811 (H) 914 (H) 135 (H) 276 (H)   Diabetes history: New Diagnosis, Pre-DM in the past prior to cancer treatments  Current orders for Inpatient glycemic control:  Novolog 0-15 units tid + hs Semglee 10 units PO prednisone 5 mg Daily  A1c 11.5 2/9  Inpatient Diabetes Program Recommendations:    Benefits check on basal insulin and CGM Lantus $35 Freestyle Libre with $0 copay  Spoke with pt and wife at bedside regarding A1c of 11.5. Pt had some expressive aphasia. Pt reports having Pre-Diabetes prior to  getting cancer treatments and prednisone. His oncologist has been monitoring for potential hyperglycemia. Discussed current A1c. Covered glucose ad A1c goals. S/s of hyper and hypoglycemia and treatment for both. Introduced the concept of an insulin pen to pt and wife. Will need review. Will see pt again tomorrow 2/11.  Thanks,  Christena Deem RN, MSN, BC-ADM Inpatient Diabetes Coordinator Team Pager [phone] (8a-5p)

## 2023-05-22 NOTE — Telephone Encounter (Signed)
 Received vm message from pt's wife, [name] [name].  She states that her husband is in the ED @ [facility] with a stroke. She would like a call back TCT [name]. Spoke with her. She states her husband is still in the ED @ [facility] and they are waiting for a bed.  Advised that I let Dr. Rosaline Coma know of this current situation. Advised that he will come and see her husband at [facility] tomorrow. Also advised that his prostate and subsequent treatment does not mean he is at any higher risk of a stroke, per Dr. Rosaline Coma. Wife voiced understanding.

## 2023-05-22 NOTE — Research (Signed)
SITE: 050     Subject # [id number]    Subprotocol: A  Inclusion Criteria  Patients who meet all of the following criteria are eligible for enrollment as study participants:  Yes No  Age > 78 years old X   Eligible to wear Holter Study X    Exclusion Criteria  Patients who meet any of these criteria are not eligible for enrollment as study participants: Yes No  1. Receiving any mechanical (respiratory or circulatory) or renal support therapy at Screening or during Visit #1.  X  2.  Any other conditions that in the opinion of the investigators are likely to prevent compliance with the study protocol or pose a safety concern if the subject participates in the study.  X  3. Poor tolerance, namely susceptible to severe skin allergies from ECG adhesive patch application.  X   Protocol: REV H                                     Residential Zip code [address] (First 3 digits ONLY)                                             [employer] Informed Consent   Subject Name: [name] [name]  Subject met inclusion and exclusion criteria.  The informed consent form, study requirements and expectations were reviewed with the subject. Subject had opportunity to read consent and questions and concerns were addressed prior to the signing of the consent form.  The subject verbalized understanding of the trial requirements.  The subject agreed to participate in the [employer] EF ACT trial and signed the informed consent at 12:02 on 22-May-2023.  The informed consent was obtained prior to performance of any protocol-specific procedures for the subject.  A copy of the signed informed consent was given to the subject and a copy was placed in the subject's medical record.   Barnell Shieh L Charisa Twitty         No current facility-administered medications for this visit.  Current Outpatient Medications:    abiraterone acetate (ZYTIGA) 250 MG tablet, Take 4 tablets (1,000 mg total) by mouth daily. Take on an empty stomach 1 hour before or 2  hours after a meal (Patient taking differently: Take 250 mg by mouth daily. Take on an empty stomach 1 hour before or 2 hours after a meal), Disp: 4 tablet, Rfl: 0   acetaminophen (TYLENOL) 500 MG tablet, Take 1 tablet (500 mg total) by mouth every 6 (six) hours as needed., Disp: 30 tablet, Rfl: 0   aspirin EC 81 MG tablet, Take 1 tablet (81 mg total) by mouth daily. Swallow whole. (Patient taking differently: Take 81 mg by mouth every other day. Swallow whole.), Disp: 30 tablet, Rfl: 0   calcium carbonate (TUMS EX) 750 MG chewable tablet, Chew 0.5 tablets by mouth daily., Disp: , Rfl:    clopidogrel (PLAVIX) 75 MG tablet, Take 1 tablet (75 mg total) by mouth daily. (Patient taking differently: Take 75 mg by mouth every other day.), Disp: 30 tablet, Rfl: 5   polyethylene glycol powder (GLYCOLAX/MIRALAX) 17 GM/SCOOP powder, Take 17 g by mouth in the morning and at bedtime., Disp: 17 g, Rfl: 0   predniSONE (DELTASONE) 5 MG tablet, Take 1 tablet (5 mg total) by mouth daily with  breakfast., Disp: 1 tablet, Rfl: 0  Facility-Administered Medications Ordered in Other Visits:     stroke: early stages of recovery book, , Does not apply, Once, Nolberto Hanlon, MD    stroke: early stages of recovery book, , Does not apply, Once, Nolberto Hanlon, MD   abiraterone acetate (ZYTIGA) tablet 250 mg, 250 mg, Oral, Daily, Nolberto Hanlon, MD   acetaminophen (TYLENOL) tablet 650 mg, 650 mg, Oral, Q4H PRN **OR** acetaminophen (TYLENOL) 160 MG/5ML solution 650 mg, 650 mg, Per Tube, Q4H PRN **OR** acetaminophen (TYLENOL) suppository 650 mg, 650 mg, Rectal, Q4H PRN, Nolberto Hanlon, MD   Melene Muller ON 05/23/2023] aspirin EC tablet 81 mg, 81 mg, Oral, Terrilyn Saver, MD   atorvastatin (LIPITOR) tablet 40 mg, 40 mg, Oral, Daily, Nolberto Hanlon, MD, 40 mg at 05/22/23 1214   clopidogrel (PLAVIX) tablet 75 mg, 75 mg, Oral, Daily, Nolberto Hanlon, MD, 75 mg at 05/22/23 1214   enoxaparin (LOVENOX) injection 40 mg, 40 mg, Subcutaneous, Q24H, Nolberto Hanlon,  MD, 40 mg at 05/22/23 0655   insulin aspart (novoLOG) injection 0-15 Units, 0-15 Units, Subcutaneous, TID WC, Nolberto Hanlon, MD, 8 Units at 05/22/23 1214   insulin aspart (novoLOG) injection 0-5 Units, 0-5 Units, Subcutaneous, QHS, Nolberto Hanlon, MD, 5 Units at 05/21/23 2300   insulin glargine-yfgn (SEMGLEE) injection 10 Units, 10 Units, Subcutaneous, Daily, Nolberto Hanlon, MD   pantoprazole (PROTONIX) EC tablet 20 mg, 20 mg, Oral, Daily, Nolberto Hanlon, MD, 20 mg at 05/22/23 1214   perflutren lipid microspheres (DEFINITY) IV suspension, 1-10 mL, Intravenous, PRN, Jonah Blue, MD, 2 mL at 05/22/23 1157   predniSONE (DELTASONE) tablet 5 mg, 5 mg, Oral, Q breakfast, Nolberto Hanlon, MD, 5 mg at 05/22/23 1610   senna-docusate (Senokot-S) tablet 1 tablet, 1 tablet, Oral, QHS PRN, Nolberto Hanlon, MD

## 2023-05-22 NOTE — Progress Notes (Signed)
 Bilateral lower extremity venous duplex has been completed.  Results can be found in chart review under CV Proc.  05/22/2023 11:15 AM  Ria Chad, RVT.

## 2023-05-22 NOTE — Progress Notes (Signed)
 Progress Note   Patient: [name] [name] JXB:[id number] DOB: 1945-08-23 DOA: 05/21/2023     1 DOS: the patient was seen and examined on 05/22/2023   Brief hospital course: 78yo with h/o metastatic prostate CA, prior CVA, CAD, and DM who presented on 2/9 with word finding difficulties and dizziness, found to have acute/subacute infarct in the the L middle frontal gyrus.  Neurology consulted.  He was taking ASA and Plavix  on alternating days PTA.  DVT US  with LLE DVT, started on Eliquis .  Assessment and Plan:  CVA Symptoms concerning for CVA Head CT concerning for infarct, MRI confirmed acute/subacute infarct of L middle frontal gyrus Admitted to telemetry for further CVA evaluation CTA with progressive high-grade stenoses of B proximal V1 segments and various other areas of moderate stenosis Echo with EF 45-50% and WMA as well as grade 1 diastolic dysfunction Neurology consulting PT/OT/ST/Nutrition Consults TOC consult for placement vs. CIR  LLE DVT Extensive acute DVT of L common femoral vein and L proximal profunda vein Likely related to his underlying malignancy Start Eliquis  for DVT and CVA treatment at this time, per neurology  Abnormal echo He is not likely to be a candidate for intervention  However, he is a patient of [facility] cardiology and has a newly depressed EF with WMA on echo Will consult cardiology  HTN Allow permissive HTN for now Treat BP only if >220/120, and then with goal of 15% reduction Not on home medications PTA   HLD LDL is 142, poor control Start Lipitor 40 mg daily   DM A1c is 11.5, poor control - likely related to daily prednisone , as prior A1c was 6.1 He does not appear to be taking home medications Started on Semglee  10 units daily Will order moderate-scale SSI DM coordinator consulted  Metastatic Prostate CA Reports difficulty tolerating Zytiga  so dose was recently changed Also on daily prednisone   PET scan in 10/2022 with 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 and  diffuse lymph node as well as probably the lungs  DNR Code status discussed with patient/wife He requests to be DNR Code status updated    Consultants: Neurology PT OT [facility] team Diabetes coordinator  Procedures: Echocardiogram 2/10  Antibiotics: None  30 Day Unplanned Readmission Risk Score    Flowsheet Row ED to Hosp-Admission (Current) from 05/21/2023 in [facility] Emergency Department at [facility]  30 Day Unplanned Readmission Risk Score (%) 11.4 Filed at 05/22/2023 0801       This score is the patient's risk of an unplanned readmission within 30 days of being discharged (0 -100%). The score is based on dignosis, age, lab data, medications, orders, and past utilization.   Low:  0-14.9   Medium: 15-21.9   High: 22-29.9   Extreme: 30 and above             Subjective: Still with some word finding difficulty.  Otherwise, no significant issues.  Physical Exam: Vitals:   05/22/23 0800 05/22/23 0900 05/22/23 1215 05/22/23 1218  BP: (!) 138/59 (!) 105/51 133/79   Pulse: (!) 59 70 60   Resp: 16 15 11    Temp: 98 F (36.7 C)   (!) 97.4 F (36.3 C)  TempSrc:    Oral  SpO2: 98% 96% 97%   Weight:      Height:         Intake/Output Summary (Last 24 hours) at 05/22/2023 1514 Last data filed at 05/22/2023 0030 Gross per 24 hour  Intake 1180 ml  Output --  Net 1180  ml   Filed Weights   05/21/23 1440  Weight: 77.1 kg    Exam:  General:  Appears calm and comfortable and is in NAD Eyes:  EOMI, normal lids, iris ENT:  grossly normal hearing, lips & tongue, mmm Neck:  no LAD, masses or thyromegaly Cardiovascular:  RRR, no m/r/g. No LE edema.  Respiratory:   CTA bilaterally with no wheezes/rales/rhonchi.  Normal respiratory effort. Abdomen:  soft, NT, ND Skin:  no rash or induration seen on limited exam Musculoskeletal:  LLE > LUE weakness, 4-5/5, no bony abnormality Psychiatric:  blunted mood and affect, speech fluent and appropriate with some  hesitation with speech and some word finding difficulties, AOx3 Neurologic:  CN 2-12 grossly intact, moves all extremities in coordinated fashion, sensation intact  Data Reviewed: I have reviewed the patient's lab results since admission.  Pertinent labs for today include:  Glucose 322, 276 Lipids: 200/34/142/122 Normal CBC    Family Communication: Wife was present throughout evaluation  Disposition: Status is: Inpatient Remains inpatient appropriate because: ongoing evaluation and treatment  Planned Discharge Destination:  TBD    Time spent: 50 minutes  Author: Lorita Rosa, MD 05/22/2023 3:14 PM  For on call review [email or web address].

## 2023-05-22 NOTE — Progress Notes (Signed)
 PT Cancellation Note  Patient Details Name: Thibault Ullom MRN: [id number] DOB: [birth year]   Cancelled Treatment:    Reason Eval/Treat Not Completed: Patient at procedure or test/unavailable (Nurse present and stated that pt is heading to Vascular lab to r/o DVT.  Will hold until results back from testing.)   Florencia Hunter 05/22/2023, 10:36 AM Jyasia Markoff M,PT Acute Rehab Services [phone]

## 2023-05-22 NOTE — Consult Note (Addendum)
 Cardiology Consultation   Patient ID: [name] [name] MRN: [id number]; DOB: 03-25-1946  Admit date: 05/21/2023 Date of Consult: 05/22/2023  PCP:  Donley Furth, MD   [facility] Providers Cardiologist:  Alexandria Angel, MD     Patient Profile:   [name] [name] is a 78 y.o. male with a hx of CAD s/p CABG (2017), CVA's (1980), PAD, prediabetes, prostate cancer who is being seen 05/22/2023 for the evaluation of stroke and decreased EF  at the request of Dr. Bennie Brave.  History of Present Illness:   [name] is 78 y/o male with the above medical history noted. He was last seen in the cardiology clinic on 03/22/2022 for cardiac monitor review. This monitor was placed after his CVA admission in 6/28-29/23.  During  admission, he reported syncopal episodes after being working outside in heat with disorientation and dizziness. Neurology suspected left frontal acute infarct. He was discharged with 3 months of DAPT.  He had a follow up at clinic 11/2021 and reported labile blood pressure with occasional head fullness. Also, he noted some left-side weakness and fatigue. Patient was encouraged to monitor BP at home and increase physical activity as tolerated.  On follow up with Dr. Katheryne Pane on 01/12/2022, patient was doing well. He only reported occasional shaking episodes in his left leg.   In cardiology clinic on 03/22/2022,  he continued to feel well. He reported more physical activity. EKG was NSR. At this visit, he reported following a strict diet since borderline diabetics. He started taking his atorvastatin  and aspirin  separate because he gets funny feeling when taken together. Cardiac event monitor showed SR, SB, ST, and 1 brief episode of NSVT. He was instructed to continue all current medications and healthy lifestyle.   Presented to the ED on 2/9 with complaints of difficulty speaking, slurred speech and confusion.  Code stroke was called in the ED.  Seen by neurology.  CT head showed new areas  of cortical hypoattenuation in the left frontal operculum.  He was outside of the window for treatment.   Initial V/S: 133/81, HR 67, RR 18, O2 97%, LDL 142 An MRI has been done which shows left middle frontal gyrus acute/subacute infarction.  ECHO 05/22/2023: LVEF 45-50%, hypokineses of distal anteroseptal wall /apex, G1D LE Vascular US   05/22/2023: acute deep vein thrombosis involving the left  common femoral vein, and left proximal profunda vein.   Cardiology asked to evaluate in the setting of the mild decline in his EF.  Talking with patient he denies any anginal symptoms prior to admission or shortness of breath.  He does have some difficulty word finding during interview but overall able to answer questions appropriately.  His wife is at the bedside and agrees.  He has been on aspirin  and Plavix  prior to admission, this was continued up until he underwent lower extremity Dopplers and found to have acute DVT.  Has now been transitioned over to Eliquis .   Past Medical History:  Diagnosis Date   CAD (coronary artery disease)    Constipation    Diabetes mellitus without complication (HCC)    borderline/ pre diabetic   NSTEMI (non-ST elevated myocardial infarction) (HCC)    at [place]   Prostate cancer [facility])    Stroke (HCC)    L side - 40 years ago    Past Surgical History:  Procedure Laterality Date   CARDIAC CATHETERIZATION     CATARACT EXTRACTION Bilateral    05/2021; 07/2021   CORONARY ARTERY BYPASS  GRAFT     at [facility]     Inpatient Medications: Scheduled Meds:   stroke: early stages of recovery book   Does not apply Once   abiraterone  acetate  250 mg Oral Daily   apixaban   10 mg Oral BID   Followed by   Cecily Cohen ON 05/29/2023] apixaban   5 mg Oral BID   atorvastatin   40 mg Oral Daily   insulin  aspart  0-15 Units Subcutaneous TID WC   insulin  aspart  0-5 Units Subcutaneous QHS   insulin  glargine-yfgn  10 Units Subcutaneous Daily   pantoprazole   20 mg Oral Daily    predniSONE   5 mg Oral Q breakfast   Continuous Infusions:  PRN Meds: acetaminophen  **OR** acetaminophen  (TYLENOL ) oral liquid 160 mg/5 mL **OR** acetaminophen , senna-docusate  Allergies:   No Known Allergies  Social History:   Social History   Socioeconomic History   Marital status: Married    Spouse name: Not on file   Number of children: 2   Years of education: Not on file   Highest education level: Master's degree (e.g., MA, MS, MEng, MEd, MSW, MBA)  Occupational History   Occupation: retired  Tobacco Use   Smoking status: Former    Current packs/day: 0.00    Types: Cigarettes    Quit date: 04/11/1968    Years since quitting: 55.1   Smokeless tobacco: Never   Tobacco comments:    quit in the 1970s  Vaping Use   Vaping status: Never Used  Substance and Sexual Activity   Alcohol use: Not Currently    Comment: Rare   Drug use: No   Sexual activity: Not on file  Other Topics Concern   Not on file  Social History Narrative   Right handed   One story home   Drinks no caffeine   Social Drivers of Health   Financial Resource Strain: Low Risk  (10/11/2022)   Overall Financial Resource Strain (CARDIA)    Difficulty of Paying Living Expenses: Not hard at all  Food Insecurity: Patient Declined (05/22/2023)   Hunger Vital Sign    Worried About Running Out of Food in the Last Year: Patient declined    Ran Out of Food in the Last Year: Patient declined  Transportation Needs: Patient Declined (05/22/2023)   PRAPARE - Administrator, Civil Service (Medical): Patient declined    Lack of Transportation (Non-Medical): Patient declined  Physical Activity: Unknown (10/11/2022)   Exercise Vital Sign    Days of Exercise per Week: 5 days    Minutes of Exercise per Session: Patient declined  Stress: No Stress Concern Present (10/11/2022)   [employer] of Occupational Health - Occupational Stress Questionnaire    Feeling of Stress : Only a little  Social Connections:  Patient Declined (05/22/2023)   Social Connection and Isolation Panel [NHANES]    Frequency of Communication with Friends and Family: Patient declined    Frequency of Social Gatherings with Friends and Family: Patient declined    Attends Religious Services: Patient declined    Database administrator or Organizations: Patient declined    Attends Banker Meetings: Patient declined    Marital Status: Patient declined  Intimate Partner Violence: Patient Declined (05/22/2023)   Humiliation, Afraid, Rape, and Kick questionnaire    Fear of Current or Ex-Partner: Patient declined    Emotionally Abused: Patient declined    Physically Abused: Patient declined    Sexually Abused: Patient declined    Family History:  Family History  Problem Relation Age of Onset   Dementia Mother        lived to 86 years   Liver disease Father    Diabetes Sister    Cirrhosis Brother    Cancer - Colon Neg Hx    Colon polyps Neg Hx    Rectal cancer Neg Hx    Esophageal cancer Neg Hx    Stomach cancer Neg Hx    Pancreatic cancer Neg Hx      ROS:  Please see the history of present illness.   All other ROS reviewed and negative.     Physical Exam/Data:   Vitals:   05/22/23 1215 05/22/23 1218 05/22/23 1615 05/22/23 1615  BP: 133/79  129/85   Pulse: 60  78   Resp: 11  (!) 25   Temp:  (!) 97.4 F (36.3 C)  99 F (37.2 C)  TempSrc:  Oral  Oral  SpO2: 97%  100%   Weight:      Height:        Intake/Output Summary (Last 24 hours) at 05/22/2023 1740 Last data filed at 05/22/2023 0030 Gross per 24 hour  Intake 180 ml  Output --  Net 180 ml      05/21/2023    2:40 PM 03/15/2023    2:43 PM 03/01/2023    2:24 PM  Last 3 Weights  Weight (lbs) 170 lb 170 lb 3 oz 168 lb  Weight (kg) 77.111 kg 77.197 kg 76.204 kg     Body mass index is 28.29 kg/m.  General:  Well nourished, well developed, in no acute distress HEENT: normal Neck: no JVD Vascular: No carotid bruits; Distal pulses 2+  bilaterally Cardiac:  normal S1, S2; RRR; no murmur  Lungs:  clear to auscultation bilaterally, no wheezing, rhonchi or rales  Abd: soft, nontender, no hepatomegaly  Ext: no edema Musculoskeletal:  No deformities, BUE and BLE strength normal and equal Skin: warm and dry  Neuro:  CNs 2-12 intact, no focal abnormalities noted Psych:  Normal affect   EKG:  The EKG was personally reviewed and demonstrates: Sinus Bradycardia, HR 58 Telemetry:  Telemetry was personally reviewed and demonstrates:  Sinus Rhythm   Relevant CV Studies:  ECHO 05/22/2023 1. Hypokinesis of the distal anteroseptal wall and apex with overall mild  LV dyfunction.   2. Left ventricular ejection fraction, by estimation, is 45 to 50%. The  left ventricle has mildly decreased function. The left ventricle  demonstrates regional wall motion abnormalities (see scoring  diagram/findings for description). Left ventricular  diastolic parameters are consistent with Grade I diastolic dysfunction  (impaired relaxation).   3. Right ventricular systolic function is normal. The right ventricular  size is normal.   4. The mitral valve is normal in structure. No evidence of mitral valve  regurgitation. No evidence of mitral stenosis.   5. The aortic valve is tricuspid. Aortic valve regurgitation is not  visualized. Aortic valve sclerosis is present, with no evidence of aortic  valve stenosis.   LE Vascular US   05/22/2023 Summary:  RIGHT:  - There is no evidence of deep vein thrombosis in the lower extremity.  - No cystic structure found in the popliteal fossa.    LEFT:  - Findings consistent with acute deep vein thrombosis involving the left  common femoral vein, and left proximal profunda vein.  - No cystic structure found in the popliteal fossa.     Laboratory Data:  High Sensitivity Troponin:  No results for  input(s): "TROPONINIHS" in the last 720 hours.   Chemistry Recent Labs  Lab 05/21/23 1222 05/21/23 1227  05/21/23 1705 05/21/23 1706 05/22/23 1024  NA 140 139 140 140 139  K 4.3 4.3 3.6 3.7 3.8  CL 103 105  --  105 106  CO2 19*  --   --   --  22  GLUCOSE 202* 204*  --  174* 322*  BUN 15 20  --  13 11  CREATININE 0.90 0.80  --  0.70 0.84  CALCIUM  9.4  --   --   --  9.0  GFRNONAA >60  --   --   --  >60  ANIONGAP 18*  --   --   --  11    Recent Labs  Lab 05/21/23 1222  PROT 6.5  ALBUMIN 3.6  AST 24  ALT 18  ALKPHOS 64  BILITOT 1.5*   Lipids  Recent Labs  Lab 05/22/23 0430  CHOL 200  TRIG 122  HDL 34*  LDLCALC 142*  CHOLHDL 5.9    Hematology Recent Labs  Lab 05/21/23 1222 05/21/23 1227 05/21/23 1705 05/21/23 1706 05/22/23 1024  WBC 9.1  --   --   --  9.9  RBC 4.51  --   --   --  4.24  HGB 15.5   < > 14.3 14.3 14.7  HCT 46.4   < > 42.0 42.0 41.5  MCV 102.9*  --   --   --  97.9  MCH 34.4*  --   --   --  34.7*  MCHC 33.4  --   --   --  35.4  RDW 12.6  --   --   --  12.6  PLT 299  --   --   --  308   < > = values in this interval not displayed.   Thyroid  No results for input(s): "TSH", "FREET4" in the last 168 hours.  BNPNo results for input(s): "BNP", "PROBNP" in the last 168 hours.  DDimer No results for input(s): "DDIMER" in the last 168 hours.   Radiology/Studies:  ECHOCARDIOGRAM COMPLETE Result Date: 05/22/2023    ECHOCARDIOGRAM REPORT   Patient Name:   JAMESDAVID STIFEL Date of Exam: 05/22/2023 Medical Rec #:  [id number]    Height:       65.0 in Accession #:    [id number]   Weight:       170.0 lb Date of Birth:  27-Dec-1945    BSA:          1.846 m Patient Age:    77 years     BP:           119/73 mmHg Patient Gender: M            HR:           62 bpm. Exam Location:  Inpatient Procedure: 2D Echo, Cardiac Doppler, Color Doppler and Intracardiac            Opacification Agent Indications:    stroke  History:        Patient has prior history of Echocardiogram examinations, most                 recent 10/07/2021. CAD, Prior CABG; PAD.  Sonographer:    Melissa Morford RDCS  (AE, PE) Referring Phys: [id number] [facility] GOEL  Sonographer Comments: Apical window technically limited IMPRESSIONS  1. Hypokinesis of the distal anteroseptal wall and apex with overall mild LV dyfunction.  2. Left  ventricular ejection fraction, by estimation, is 45 to 50%. The left ventricle has mildly decreased function. The left ventricle demonstrates regional wall motion abnormalities (see scoring diagram/findings for description). Left ventricular diastolic parameters are consistent with Grade I diastolic dysfunction (impaired relaxation).  3. Right ventricular systolic function is normal. The right ventricular size is normal.  4. The mitral valve is normal in structure. No evidence of mitral valve regurgitation. No evidence of mitral stenosis.  5. The aortic valve is tricuspid. Aortic valve regurgitation is not visualized. Aortic valve sclerosis is present, with no evidence of aortic valve stenosis. FINDINGS  Left Ventricle: Left ventricular ejection fraction, by estimation, is 45 to 50%. The left ventricle has mildly decreased function. The left ventricle demonstrates regional wall motion abnormalities. Definity  contrast agent was given IV to delineate the left ventricular endocardial borders. The left ventricular internal cavity size was normal in size. There is no left ventricular hypertrophy. Left ventricular diastolic parameters are consistent with Grade I diastolic dysfunction (impaired relaxation). Right Ventricle: The right ventricular size is normal. Right ventricular systolic function is normal. Left Atrium: Left atrial size was normal in size. Right Atrium: Right atrial size was normal in size. Pericardium: There is no evidence of pericardial effusion. Mitral Valve: The mitral valve is normal in structure. No evidence of mitral valve regurgitation. No evidence of mitral valve stenosis. Tricuspid Valve: The tricuspid valve is normal in structure. Tricuspid valve regurgitation is not demonstrated. No  evidence of tricuspid stenosis. Aortic Valve: The aortic valve is tricuspid. Aortic valve regurgitation is not visualized. Aortic valve sclerosis is present, with no evidence of aortic valve stenosis. Pulmonic Valve: The pulmonic valve was normal in structure. Pulmonic valve regurgitation is not visualized. No evidence of pulmonic stenosis. Aorta: The aortic root is normal in size and structure. Venous: The inferior vena cava was not well visualized. IAS/Shunts: The interatrial septum was not well visualized. Additional Comments: Hypokinesis of the distal anteroseptal wall and apex with overall mild LV dyfunction.  LEFT VENTRICLE PLAX 2D LVIDd:         4.30 cm      Diastology LVIDs:         3.90 cm      LV e' medial:    4.13 cm/s LV PW:         0.80 cm      LV E/e' medial:  10.4 LV IVS:        0.90 cm      LV e' lateral:   5.11 cm/s LVOT diam:     2.00 cm      LV E/e' lateral: 8.4 LV SV:         38 LV SV Index:   20 LVOT Area:     3.14 cm  LV Volumes (MOD) LV vol d, MOD A2C: 111.0 ml LV vol d, MOD A4C: 141.5 ml LV vol s, MOD A2C: 59.0 ml LV vol s, MOD A4C: 91.6 ml LV SV MOD A2C:     52.0 ml LV SV MOD A4C:     141.5 ml LV SV MOD BP:      46.3 ml LEFT ATRIUM           Index        RIGHT ATRIUM           Index LA diam:      3.40 cm 1.84 cm/m   RA Area:     20.60 cm LA Vol (A4C): 33.9 ml 18.36 ml/m  RA  Volume:   67.30 ml  36.46 ml/m  AORTIC VALVE LVOT Vmax:   70.50 cm/s LVOT Vmean:  48.100 cm/s LVOT VTI:    0.120 m  AORTA Ao Root diam: 3.30 cm Ao Asc diam:  3.35 cm MITRAL VALVE MV Area (PHT): 5.42 cm    SHUNTS MV Decel Time: 140 msec    Systemic VTI:  0.12 m MV E velocity: 42.80 cm/s  Systemic Diam: 2.00 cm MV A velocity: 66.00 cm/s MV E/A ratio:  0.65 Alexandria Angel MD Electronically signed by Alexandria Angel MD Signature Date/Time: 05/22/2023/1:52:32 PM    Final    VAS US  LOWER EXTREMITY VENOUS (DVT) Result Date: 05/22/2023  Lower Venous DVT Study Patient Name:  GALILEO SIECK  Date of Exam:   05/22/2023 Medical  Rec #: [id number]     Accession #:    [id number] Date of Birth: [birth year]     Patient Gender: M Patient Age:   18 years Exam Location:  [facility] Procedure:      VAS US  LOWER EXTREMITY VENOUS (DVT) Referring Phys: Margart Shears DE LA TORRE --------------------------------------------------------------------------------  Indications: Stroke. Other Indications: CABG, prior CVA. Comparison Study: No prior exam. Performing Technologist: Ria Chad  Examination Guidelines: A complete evaluation includes B-mode imaging, spectral Doppler, color Doppler, and power Doppler as needed of all accessible portions of each vessel. Bilateral testing is considered an integral part of a complete examination. Limited examinations for reoccurring indications may be performed as noted. The reflux portion of the exam is performed with the patient in reverse Trendelenburg.  +---------+---------------+---------+-----------+----------+--------------+ RIGHT    CompressibilityPhasicitySpontaneityPropertiesThrombus Aging +---------+---------------+---------+-----------+----------+--------------+ CFV      Full           Yes      Yes                                 +---------+---------------+---------+-----------+----------+--------------+ SFJ      Full           Yes      Yes                                 +---------+---------------+---------+-----------+----------+--------------+ FV Prox  Full                                                        +---------+---------------+---------+-----------+----------+--------------+ FV Mid   Full                                                        +---------+---------------+---------+-----------+----------+--------------+ FV DistalFull                                                        +---------+---------------+---------+-----------+----------+--------------+ PFV      Full                                                         +---------+---------------+---------+-----------+----------+--------------+  POP      Full           No       No                                  +---------+---------------+---------+-----------+----------+--------------+ PTV      Full                                                        +---------+---------------+---------+-----------+----------+--------------+ PERO     Full                                                        +---------+---------------+---------+-----------+----------+--------------+   +---------+---------------+---------+-----------+----------+--------------+ LEFT     CompressibilityPhasicitySpontaneityPropertiesThrombus Aging +---------+---------------+---------+-----------+----------+--------------+ CFV      Partial        Yes      Yes                                 +---------+---------------+---------+-----------+----------+--------------+ SFJ      Full           Yes      Yes                                 +---------+---------------+---------+-----------+----------+--------------+ FV Prox  Full           Yes      Yes                                 +---------+---------------+---------+-----------+----------+--------------+ FV Mid   Full                                                        +---------+---------------+---------+-----------+----------+--------------+ FV DistalFull                                                        +---------+---------------+---------+-----------+----------+--------------+ PFV      Partial        Yes      Yes                                 +---------+---------------+---------+-----------+----------+--------------+ POP      Full           Yes      Yes                                 +---------+---------------+---------+-----------+----------+--------------+ PTV      Full                                                         +---------+---------------+---------+-----------+----------+--------------+  PERO     Full                                                        +---------+---------------+---------+-----------+----------+--------------+     Summary: RIGHT: - There is no evidence of deep vein thrombosis in the lower extremity.  - No cystic structure found in the popliteal fossa.  LEFT: - Findings consistent with acute deep vein thrombosis involving the left common femoral vein, and left proximal profunda vein.  - No cystic structure found in the popliteal fossa.  *See table(s) above for measurements and observations. Electronically signed by Delaney Fearing on 05/22/2023 at 12:19:50 PM.    Final    CT ANGIO HEAD NECK W WO CM Result Date: 05/21/2023 CLINICAL DATA:  Acute anterior left MCA territory infarct. EXAM: CT ANGIOGRAPHY HEAD AND NECK WITH AND WITHOUT CONTRAST TECHNIQUE: Multidetector CT imaging of the head and neck was performed using the standard protocol during bolus administration of intravenous contrast. Multiplanar CT image reconstructions and MIPs were obtained to evaluate the vascular anatomy. Carotid stenosis measurements (when applicable) are obtained utilizing NASCET criteria, using the distal internal carotid diameter as the denominator. RADIATION DOSE REDUCTION: This exam was performed according to the departmental dose-optimization program which includes automated exposure control, adjustment of the mA and/or kV according to patient size and/or use of iterative reconstruction technique. CONTRAST:  75mL OMNIPAQUE  IOHEXOL  350 MG/ML SOLN COMPARISON:  CT head without contrast 05/21/2023. MR head without contrast 05/21/2023. CT angio head and neck 10/07/2021. FINDINGS: CTA NECK FINDINGS Aortic arch: Common origin of the left common carotid artery and innominate artery is again noted. Atherosclerotic calcifications are present at the origin left subclavian artery without significant stenosis. Additional  calcifications are present in the distal aortic arch. No aneurysm or dissection is present. Right carotid system: The right common carotid artery is within normal limits. The bifurcation scratched at atherosclerotic changes are present at the right carotid bifurcation without significant stenosis. The cervical right ICA is otherwise normal. Left carotid system: The left common carotid artery is within normal limits. Atherosclerotic changes are present at the bifurcation. No significant stenosis is present. Cervical left ICA is normal. Vertebral arteries: The right vertebral artery is dominant. Both vertebral arteries originate from the subclavian arteries without significant focal stenosis at either vertebral artery origin. Progressive high-grade stenoses are present in the proximal V1 segments bilaterally. The V2 segments are normal without tandem stenoses. Skeleton: Vertebral body heights and alignment are normal. Reversal of the normal cervical lordosis is present. No focal osseous lesions are present. Other neck: Soft tissues the neck are otherwise unremarkable. Salivary glands are within normal limits. Thyroid  is normal. No significant adenopathy is present. No focal mucosal or submucosal lesions are present. Upper chest: The lung apices are clear.  Median sternotomy is noted. Review of the MIP images confirms the above findings CTA HEAD FINDINGS Anterior circulation: Atherosclerotic calcifications are present within the cavernous internal carotid arteries bilaterally. Moderate right-sided stenosis is present. Moderate stenoses are present bilaterally. The anterior genu and supraclinoid ICA are normal bilaterally. The A1 and M1 segments are normal. No anterior communicating artery is visible. The MCA bifurcations are within normal limits bilaterally. The ACA and MCA branch vessels are within normal limits. No significant proximal stenosis or occlusion is present. No aneurysm is present. Posterior circulation:  Moderate stenoses are present at the dural margin of both vertebral arteries. The distal V4 segments are hypoplastic. The basilar artery is more hypoplastic than on the prior study. The superior cerebellar arteries are patent. The left posterior cerebral artery originates from the basilar tip. The right posterior cerebral artery is of fetal type. Moderate stenosis is present in the distal left P2 segment just proximal to the bifurcation. Right PCA branch vessels are within normal limits. Venous sinuses: The dural sinuses are patent. The straight sinus and deep cerebral veins are intact. Cortical veins are within normal limits. No significant vascular malformation is evident. Anatomic variants: Fetal type right posterior cerebral artery. Review of the MIP images confirms the above findings IMPRESSION: 1. 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 2. Moderate stenoses at the dural margin of both vertebral arteries and decreased size of the more distal basilar artery, likely reflecting decreased perfusion. 3. 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 4. Moderate stenosis in the distal left P2 segment just proximal to the bifurcation. 5. Atherosclerotic changes at the carotid bifurcations bilaterally without significant stenosis. 6.  Aortic Atherosclerosis (ICD10-I70.0). Electronically Signed   By: Audree Leas M.D.   On: 05/21/2023 16:34   MR BRAIN WO CONTRAST Result Date: 05/21/2023 CLINICAL DATA:  Stroke, follow-up. Acute onset of confusion and slurred speech. EXAM: MRI HEAD WITHOUT CONTRAST TECHNIQUE: Multiplanar, multiecho pulse sequences of the brain and surrounding structures were obtained without intravenous contrast. COMPARISON:  CT head without contrast 05/21/2023. MR head without and with contrast 10/06/2021. FINDINGS: Brain: The suspected acute infarct involving the left middle frontal gyrus is confirmed on diffusion-weighted images. T2  and FLAIR hyperintensities are associated with this infarct. The infarct is posterior to the previous left superior frontal gyrus infarct. Remote encephalomalacia is present in the left parietal lobe. Periventricular and subcortical T2 hyperintensities are otherwise mildly advanced for age. The ventricles are of normal size. No significant extraaxial fluid collection is present. Remote lacunar infarcts are present in the right paramedian pons. The brainstem and cerebellum are otherwise within normal limits. Vascular: Flow is present in the major intracranial arteries. Skull and upper cervical spine: The craniocervical junction is normal. Upper cervical spine is within normal limits. Marrow signal is unremarkable. Sinuses/Orbits: Polyp or mucous retention cyst is present in the inferior left maxillary sinus. The paranasal sinuses and mastoid air cells are otherwise clear. Bilateral lens replacements are noted. Globes and orbits are otherwise unremarkable. IMPRESSION: 1. Acute/subacute infarct of the left middle frontal gyrus. 2. Remote encephalomalacia of the left superior frontal gyrus and left parietal lobe. 3. 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 4. Periventricular and subcortical T2 hyperintensities bilaterally are otherwise mildly advanced for age. This likely reflects the sequela of chronic microvascular ischemia. Electronically Signed   By: Audree Leas M.D.   On: 05/21/2023 15:47   CT HEAD CODE STROKE WO CONTRAST Result Date: 05/21/2023 CLINICAL DATA:  Code stroke. Neuro deficit, acute, stroke suspected. Personal history of prostate cancer. EXAM: CT HEAD WITHOUT CONTRAST TECHNIQUE: Contiguous axial images were obtained from the base of the skull through the vertex without intravenous contrast. RADIATION DOSE REDUCTION: This exam was performed according to the departmental dose-optimization program which includes automated exposure control, adjustment of the mA and/or kV according to  patient size and/or use of iterative reconstruction technique. COMPARISON:  CT head without contrast and MR head without and with contrast 10/06/2021 FINDINGS: Brain: Remote infarcts of the high left frontal and parietal  lobes demonstrate expected evolution. New areas of cortical hypoattenuation are present in the left frontal operculum just posterior to the more remote infarct. No acute hemorrhage or mass lesion is present. Basal ganglia are intact. The insular cortex is normal. The brainstem and cerebellum are within normal limits. Midline structures are within normal limits. Vascular: Atherosclerotic calcifications are present within the cavernous internal carotid arteries bilaterally. No hyperdense vessel is present. Skull: Calvarium is intact. No focal lytic or blastic lesions are present. No significant extracranial soft tissue lesion is present. Sinuses/Orbits: The paranasal sinuses and mastoid air cells are clear. The globes and orbits are within normal limits. Other: ASPECTS (Alberta Stroke Program Early CT Score) - Ganglionic level infarction (caudate, lentiform nuclei, internal capsule, insula, M1-M3 cortex): 2/3 - Supraganglionic infarction (M4-M6 cortex): 7/7 Total score (0-10 with 10 being normal): 9/10 IMPRESSION: 1. New areas of cortical hypoattenuation in the left frontal operculum just posterior to the more remote infarct. This likely represents acute or subacute infarct. 2. Remote infarcts of the high left frontal and parietal lobes demonstrate expected evolution. 3. Aspects is 9/10. The above was relayed via text pager to Dr. Renaee Caro on 05/21/2023 at 12:46 . Electronically Signed   By: Audree Leas M.D.   On: 05/21/2023 12:46     Assessment and Plan:   Izeiah Silverstone is a 78 y.o. male with a hx of CAD s/p CABG (2017), CVA's (1980), PAD, prediabetes, metastatic prostate cancer who is being seen 05/22/2023 for the evaluation of stroke and decreased EF  at the request of Dr. Bennie Brave.  Acute HFrEF  -- ECHO 05/22/2023: LVEF 45-50%, hypokineses of distal anteroseptal wall /apex, G1DD, noted in the setting of acute CVA.  On exam he does not exhibit any signs of volume overload and denies any symptoms prior to admission -- GDMT limited in the setting of acute CVA and allowing for permissive hypertension, will not add additional therapy at this time -- For outpatient follow-up with repeat echocardiogram once recovered from acute CVA  Acute CVA -- MRI notable for left middle frontal gyrus acute/subacute infarction -- Initially on aspirin , Plavix  and statin.  Aspirin  and Plavix  have been stopped in the setting of acute DVT and transition to Eliquis  -- Per neurology  CAD status post CABG '17 -- Denies any anginal symptoms, EKG nonischemic -- Recommend continuation of aspirin  81 mg daily, statin  Acute left common femora, proximal profunda vein DVT -- Currently on Eliquis  10 mg twice daily  Metastatic prostate CA -- PET scan 10/2022 with 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, diffuse lymph nodes as well as possible lungs  -- on daily prednisone , Zytiga   Diabetes -- Hemoglobin A1c 11.5 -- Management per primary team  For questions or updates, please contact [facility] Please consult [email or web address] for contact info under  Signed, Johnie Nailer, NP  05/22/2023 5:40 PM   Patient seen and examined, note reviewed with the signed Advanced Practice Provider. I personally reviewed laboratory data, imaging studies and relevant notes. I independently examined the patient and formulated the important aspects of the plan. I have personally discussed the plan with the patient and/or family. Comments or changes to the note/plan are indicated below.  Patient was seen and examined at his bedside. His wife was at his bedside.  Acute heart failure with reduced ejection  Depressed ejection Fraction EF 45-50% Acute CVA  CAD s/p CABG 2017 Acute left common femoral proximal profunda  DVT Metastatic Protstate CA Diabetes   Clinically he does not appears to  be volume overloaded.  At this time do not think this patient needs to be diuresed.  Echo noted wall motion abnormalities but at this time in the setting of his acute stroke we will hold off on any ischemic evaluation he does not have any anginal symptoms at this time. Once permissive hypertension is over he will benefit from starting guideline directed therapy with low-dose beta-blocker and Entresto. Continue aspirin  and Plavix .  Currently on Lovenox  for DVT can transition to Eliquis . Diabetes per primary team  Norely Schlick DO, MS [facility] Attending Cardiologist [facility]  [address] [place], [place] [address] [phone] Website: [email or web address]

## 2023-05-22 NOTE — Telephone Encounter (Signed)
 Pharmacy Patient Advocate Encounter   Received notification from Physician's Office that prior authorization for Freestyle CGM is required/requested.   Insurance verification completed.   The patient is insured through [employer] .   Per test claim: PA required; PA submitted to above mentioned insurance via CoverMyMeds Key/confirmation #/EOC [id number] Status is pending

## 2023-05-22 NOTE — Telephone Encounter (Signed)
 Pharmacy Patient Advocate Encounter  Insurance verification completed.    The patient is insured through [employer]. Patient has Medicare and is not eligible for a copay card, but may be able to apply for patient assistance or Medicare RX Payment Plan (Patient Must reach out to their plan, if eligible for payment plan), if available.    Ran test claim for Lantus  and the current 30 day co-pay is $35.   This test claim was processed through [facility]- copay amounts may vary at other pharmacies due to [employer], or as the patient moves through the different stages of their insurance plan.

## 2023-05-22 NOTE — Progress Notes (Addendum)
 STROKE TEAM PROGRESS NOTE   INTERIM HISTORY/SUBJECTIVE Patient presented with confusion and dysarthria, which is still present per his wife.  MRI reveals acute infarct in the left middle frontal gyrus as well as remote pontine infarcts.  He and his wife state that he has been taking aspirin  and Plavix  on alternating days at home. He has history of metastatic prostate adenocarcinoma with bony mets  OBJECTIVE  CBC    Component Value Date/Time   WBC 9.9 05/22/2023 1024   RBC 4.24 05/22/2023 1024   HGB 14.7 05/22/2023 1024   HGB 14.6 03/15/2023 1436   HCT 41.5 05/22/2023 1024   PLT 308 05/22/2023 1024   PLT 278 03/15/2023 1436   MCV 97.9 05/22/2023 1024   MCH 34.7 (H) 05/22/2023 1024   MCHC 35.4 05/22/2023 1024   RDW 12.6 05/22/2023 1024   LYMPHSABS 2.0 05/22/2023 1024   MONOABS 0.5 05/22/2023 1024   EOSABS 0.3 05/22/2023 1024   BASOSABS 0.1 05/22/2023 1024    BMET    Component Value Date/Time   NA 139 05/22/2023 1024   K 3.8 05/22/2023 1024   CL 106 05/22/2023 1024   CO2 22 05/22/2023 1024   GLUCOSE 322 (H) 05/22/2023 1024   BUN 11 05/22/2023 1024   CREATININE 0.84 05/22/2023 1024   CREATININE 0.84 03/15/2023 1436   CALCIUM  9.0 05/22/2023 1024   GFRNONAA >60 05/22/2023 1024   GFRNONAA >60 03/15/2023 1436    IMAGING past 24 hours VAS US  LOWER EXTREMITY VENOUS (DVT) Result Date: 05/22/2023  Lower Venous DVT Study Patient Name:  [name] [name]  Date of Exam:   05/22/2023 Medical Rec #: [id number]     Accession #:    [id number] Date of Birth: [birth year]     Patient Gender: M Patient Age:   78 years Exam Location:  [facility] Procedure:      VAS US  LOWER EXTREMITY VENOUS (DVT) Referring Phys: Margart Shears DE LA TORRE --------------------------------------------------------------------------------  Indications: Stroke. Other Indications: CABG, prior CVA. Comparison Study: No prior exam. Performing Technologist: Ria Chad  Examination Guidelines: A complete evaluation  includes B-mode imaging, spectral Doppler, color Doppler, and power Doppler as needed of all accessible portions of each vessel. Bilateral testing is considered an integral part of a complete examination. Limited examinations for reoccurring indications may be performed as noted. The reflux portion of the exam is performed with the patient in reverse Trendelenburg.  +---------+---------------+---------+-----------+----------+--------------+ RIGHT    CompressibilityPhasicitySpontaneityPropertiesThrombus Aging +---------+---------------+---------+-----------+----------+--------------+ CFV      Full           Yes      Yes                                 +---------+---------------+---------+-----------+----------+--------------+ SFJ      Full           Yes      Yes                                 +---------+---------------+---------+-----------+----------+--------------+ FV Prox  Full                                                        +---------+---------------+---------+-----------+----------+--------------+ FV Mid  Full                                                        +---------+---------------+---------+-----------+----------+--------------+ FV DistalFull                                                        +---------+---------------+---------+-----------+----------+--------------+ PFV      Full                                                        +---------+---------------+---------+-----------+----------+--------------+ POP      Full           No       No                                  +---------+---------------+---------+-----------+----------+--------------+ PTV      Full                                                        +---------+---------------+---------+-----------+----------+--------------+ PERO     Full                                                         +---------+---------------+---------+-----------+----------+--------------+   +---------+---------------+---------+-----------+----------+--------------+ LEFT     CompressibilityPhasicitySpontaneityPropertiesThrombus Aging +---------+---------------+---------+-----------+----------+--------------+ CFV      Partial        Yes      Yes                                 +---------+---------------+---------+-----------+----------+--------------+ SFJ      Full           Yes      Yes                                 +---------+---------------+---------+-----------+----------+--------------+ FV Prox  Full           Yes      Yes                                 +---------+---------------+---------+-----------+----------+--------------+ FV Mid   Full                                                        +---------+---------------+---------+-----------+----------+--------------+  FV DistalFull                                                        +---------+---------------+---------+-----------+----------+--------------+ PFV      Partial        Yes      Yes                                 +---------+---------------+---------+-----------+----------+--------------+ POP      Full           Yes      Yes                                 +---------+---------------+---------+-----------+----------+--------------+ PTV      Full                                                        +---------+---------------+---------+-----------+----------+--------------+ PERO     Full                                                        +---------+---------------+---------+-----------+----------+--------------+     Summary: RIGHT: - There is no evidence of deep vein thrombosis in the lower extremity.  - No cystic structure found in the popliteal fossa.  LEFT: - Findings consistent with acute deep vein thrombosis involving the left common femoral vein, and left proximal profunda  vein.  - No cystic structure found in the popliteal fossa.  *See table(s) above for measurements and observations. Electronically signed by Delaney Fearing on 05/22/2023 at 12:19:50 PM.    Final    CT ANGIO HEAD NECK W WO CM Result Date: 05/21/2023 CLINICAL DATA:  Acute anterior left MCA territory infarct. EXAM: CT ANGIOGRAPHY HEAD AND NECK WITH AND WITHOUT CONTRAST TECHNIQUE: Multidetector CT imaging of the head and neck was performed using the standard protocol during bolus administration of intravenous contrast. Multiplanar CT image reconstructions and MIPs were obtained to evaluate the vascular anatomy. Carotid stenosis measurements (when applicable) are obtained utilizing NASCET criteria, using the distal internal carotid diameter as the denominator. RADIATION DOSE REDUCTION: This exam was performed according to the departmental dose-optimization program which includes automated exposure control, adjustment of the mA and/or kV according to patient size and/or use of iterative reconstruction technique. CONTRAST:  75mL OMNIPAQUE  IOHEXOL  350 MG/ML SOLN COMPARISON:  CT head without contrast 05/21/2023. MR head without contrast 05/21/2023. CT angio head and neck 10/07/2021. FINDINGS: CTA NECK FINDINGS Aortic arch: Common origin of the left common carotid artery and innominate artery is again noted. Atherosclerotic calcifications are present at the origin left subclavian artery without significant stenosis. Additional calcifications are present in the distal aortic arch. No aneurysm or dissection is present. Right carotid system: The right common carotid artery is within normal limits. The bifurcation scratched at atherosclerotic changes are present at the right carotid bifurcation without significant stenosis. The cervical  right ICA is otherwise normal. Left carotid system: The left common carotid artery is within normal limits. Atherosclerotic changes are present at the bifurcation. No significant stenosis is  present. Cervical left ICA is normal. Vertebral arteries: The right vertebral artery is dominant. Both vertebral arteries originate from the subclavian arteries without significant focal stenosis at either vertebral artery origin. Progressive high-grade stenoses are present in the proximal V1 segments bilaterally. The V2 segments are normal without tandem stenoses. Skeleton: Vertebral body heights and alignment are normal. Reversal of the normal cervical lordosis is present. No focal osseous lesions are present. Other neck: Soft tissues the neck are otherwise unremarkable. Salivary glands are within normal limits. Thyroid  is normal. No significant adenopathy is present. No focal mucosal or submucosal lesions are present. Upper chest: The lung apices are clear.  Median sternotomy is noted. Review of the MIP images confirms the above findings CTA HEAD FINDINGS Anterior circulation: Atherosclerotic calcifications are present within the cavernous internal carotid arteries bilaterally. Moderate right-sided stenosis is present. Moderate stenoses are present bilaterally. The anterior genu and supraclinoid ICA are normal bilaterally. The A1 and M1 segments are normal. No anterior communicating artery is visible. The MCA bifurcations are within normal limits bilaterally. The ACA and MCA branch vessels are within normal limits. No significant proximal stenosis or occlusion is present. No aneurysm is present. Posterior circulation: Moderate stenoses are present at the dural margin of both vertebral arteries. The distal V4 segments are hypoplastic. The basilar artery is more hypoplastic than on the prior study. The superior cerebellar arteries are patent. The left posterior cerebral artery originates from the basilar tip. The right posterior cerebral artery is of fetal type. Moderate stenosis is present in the distal left P2 segment just proximal to the bifurcation. Right PCA branch vessels are within normal limits. Venous  sinuses: The dural sinuses are patent. The straight sinus and deep cerebral veins are intact. Cortical veins are within normal limits. No significant vascular malformation is evident. Anatomic variants: Fetal type right posterior cerebral artery. Review of the MIP images confirms the above findings IMPRESSION: 1. 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 2. Moderate stenoses at the dural margin of both vertebral arteries and decreased size of the more distal basilar artery, likely reflecting decreased perfusion. 3. 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 4. Moderate stenosis in the distal left P2 segment just proximal to the bifurcation. 5. Atherosclerotic changes at the carotid bifurcations bilaterally without significant stenosis. 6.  Aortic Atherosclerosis (ICD10-I70.0). Electronically Signed   By: Audree Leas M.D.   On: 05/21/2023 16:34   MR BRAIN WO CONTRAST Result Date: 05/21/2023 CLINICAL DATA:  Stroke, follow-up. Acute onset of confusion and slurred speech. EXAM: MRI HEAD WITHOUT CONTRAST TECHNIQUE: Multiplanar, multiecho pulse sequences of the brain and surrounding structures were obtained without intravenous contrast. COMPARISON:  CT head without contrast 05/21/2023. MR head without and with contrast 10/06/2021. FINDINGS: Brain: The suspected acute infarct involving the left middle frontal gyrus is confirmed on diffusion-weighted images. T2 and FLAIR hyperintensities are associated with this infarct. The infarct is posterior to the previous left superior frontal gyrus infarct. Remote encephalomalacia is present in the left parietal lobe. Periventricular and subcortical T2 hyperintensities are otherwise mildly advanced for age. The ventricles are of normal size. No significant extraaxial fluid collection is present. Remote lacunar infarcts are present in the right paramedian pons. The brainstem and cerebellum are otherwise within  normal limits. Vascular: Flow is present in the major intracranial  arteries. Skull and upper cervical spine: The craniocervical junction is normal. Upper cervical spine is within normal limits. Marrow signal is unremarkable. Sinuses/Orbits: Polyp or mucous retention cyst is present in the inferior left maxillary sinus. The paranasal sinuses and mastoid air cells are otherwise clear. Bilateral lens replacements are noted. Globes and orbits are otherwise unremarkable. IMPRESSION: 1. Acute/subacute infarct of the left middle frontal gyrus. 2. Remote encephalomalacia of the left superior frontal gyrus and left parietal lobe. 3. 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 4. Periventricular and subcortical T2 hyperintensities bilaterally are otherwise mildly advanced for age. This likely reflects the sequela of chronic microvascular ischemia. Electronically Signed   By: Audree Leas M.D.   On: 05/21/2023 15:47    Vitals:   05/22/23 0800 05/22/23 0900 05/22/23 1215 05/22/23 1218  BP: (!) 138/59 (!) 105/51 133/79   Pulse: (!) 59 70 60   Resp: 16 15 11    Temp: 98 F (36.7 C)   (!) 97.4 F (36.3 C)  TempSrc:    Oral  SpO2: 98% 96% 97%   Weight:      Height:         PHYSICAL EXAM General:  Alert, well-nourished, well-developed patient in no acute distress Psych:  Mood and affect appropriate for situation CV: Regular rate and rhythm on monitor Respiratory:  Regular, unlabored respirations on room air  NEURO:  Mental Status: Alert and oriented to person place and date but unable to state why he came to the hospital Speech/Language: speech is with mild dysarthria  Cranial Nerves:  II: PERRL.  III, IV, VI: EOMI. Eyelids elevate symmetrically.  V: Sensation is intact to light touch and symmetrical to face.  VII: Right facial droop VIII: hearing intact to voice. IX, X: Voice is dysarthric XII: tongue is midline without fasciculations. Motor: Able to move all 4 extremities with  good antigravity strength, but diminished fine finger movements on the right, drift in the left leg and right leg stronger than left Tone: is normal and bulk is normal Sensation- Intact to light touch bilaterally.  Coordination: FTN intact bilaterally Gait- deferred  Most Recent NIH  1a Level of Conscious.: 0 1b LOC Questions: 0 1c LOC Commands: 0 2 Best Gaze: 0 3 Visual: 0 4 Facial Palsy: 1 5a Motor Arm - left: 0 5b Motor Arm - Right: 0 6a Motor Leg - Left: 1 6b Motor Leg - Right: 0 7 Limb Ataxia: 0 8 Sensory: 0 9 Best Language: 0 10 Dysarthria: 1 11 Extinct. and Inatten.: 0 TOTAL: 3   ASSESSMENT/PLAN  [name] is a 78 y.o. male with history of CAD status post CABG, hypertension, hyperlipidemia, stroke, diabetes, PVD and prostate cancer admitted for acute onset of confusion and dysarthria.  Patient currently is slightly dysarthric and cannot explain why he is in the hospital.  MRI reveals acute infarct in the left middle frontal gyrus.  Question hypercoagulable state due to prostate cancer.  Patient and wife state that he has been taking aspirin  and Plavix  on alternate days at home.  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 Etiology: Question embolic in the setting of possible hypercoagulable state from prostate cancer Code Stroke CT head new area of cortical hypoattenuation in left frontal operculum possibly representing acute infarct ASPECTS 9  CTA head & neck progressive high-grade stenoses in proximal V1 segments, moderate stenoses at dural margin of both vertebral arteries, moderate stenoses of cavernous ICAs bilaterally, moderate stenosis  in distal left P2 segment MRI acute/subacute infarct of the left middle frontal gyrus, remote encephalomalacia of left superior frontal gyrus and left parietal lobe, remote lacunar infarct of right paramedian pons, chronic microvascular ischemic disease 2D Echo EF 45 to 50%, hypokinesis of distal  anteroseptal wall and apex with mild LV dysfunction, grade 1 diastolic dysfunction, normal left atrial size, interatrial septum not well-visualized Bilateral lower extremity ultrasound, DVT in left common femoral vein and left proximal profunda vein LDL 142 HgbA1c 11.5 VTE prophylaxis -anticoagulated with Eliquis  Aspirin  81 mg and Plavix  75 mg on alternating days  prior to admission, now on Eliquis  (apixaban ) daily  Therapy recommendations:  Pending Disposition: Pending  Hx of Stroke/TIA Remote lacunar infarct of right paramedian pons seen on MRI  DVT of the left common femoral vein Patient found to have DVT on lower extremity ultrasound today Begin anticoagulation with Eliquis   Hypertension Home meds: None Stable Blood Pressure Goal: BP less than 220/110   Hyperlipidemia Home meds: None LDL 142, goal < 70 Add atorvastatin  40 mg daily Continue statin at discharge  Diabetes type II Uncontrolled Home meds: None HgbA1c 11.5, goal < 7.0 CBGs SSI Diabetes coordinator consult Recommend close follow-up with PCP for better DM control   Other Stroke Risk Factors Coronary artery disease   Other Active Problems History of prostate cancer-continue home Zytiga  and prednisone   Hospital day # 1  Patient seen by NP with MD, MD to edit note as needed. [name] [name] , MSN, AGACNP-BC [facility] See Amion for schedule and pager information 05/22/2023 2:02 PM   STROKE MD NOTE :  I have personally obtained history,examined this patient, reviewed notes, independently viewed imaging studies, participated in medical decision making and plan of care.ROS completed by me personally and pertinent positives fully documented  I have made any additions or clarifications directly to the above note. Agree with note above.  Patient presented with subacute confusion slurred speech with MRI scan showing left frontal infarct as well as old cortical and subcortical infarcts.  He has  been metastatic prostate adenocarcinoma and possible etiologies for stroke include cancer related hypercoagulability versus paroxysmal A-fib since lower extremity Dopplers are positive for DVT recommend anticoagulation with Eliquis  and no need for additional antiplatelet therapy.  Long discussion with patient and wife at the bedside and answered questions.  Discussed with Dr. Murrel Arnt.  Greater than 50% time during this 50-minute visit was spent in counseling and coordination of care about his stroke and discussion about evaluation and treatment and answering questions.  Stroke team will sign off.  Can call for questions  Ardella Beaver, MD Medical Director [facility] Pager: [phone] 05/22/2023 5:18 PM  To contact Stroke Continuity provider, please refer to [email or web address]. After hours, contact General Neurology

## 2023-05-23 ENCOUNTER — Telehealth (HOSPITAL_COMMUNITY): Payer: Self-pay | Admitting: Pharmacy Technician

## 2023-05-23 ENCOUNTER — Encounter: Payer: Self-pay | Admitting: Hematology and Oncology

## 2023-05-23 ENCOUNTER — Other Ambulatory Visit (HOSPITAL_COMMUNITY): Payer: Self-pay

## 2023-05-23 DIAGNOSIS — I639 Cerebral infarction, unspecified: Secondary | ICD-10-CM | POA: Diagnosis not present

## 2023-05-23 LAB — GLUCOSE, CAPILLARY
Glucose-Capillary: 166 mg/dL — ABNORMAL HIGH (ref 70–99)
Glucose-Capillary: 189 mg/dL — ABNORMAL HIGH (ref 70–99)

## 2023-05-23 MED ORDER — CLOPIDOGREL BISULFATE 75 MG PO TABS
75.0000 mg | ORAL_TABLET | Freq: Every day | ORAL | 0 refills | Status: DC
  Filled 2023-05-23: qty 30, 30d supply, fill #0

## 2023-05-23 MED ORDER — ATORVASTATIN CALCIUM 40 MG PO TABS
40.0000 mg | ORAL_TABLET | Freq: Every day | ORAL | 0 refills | Status: DC
  Filled 2023-05-23: qty 30, 30d supply, fill #0

## 2023-05-23 MED ORDER — EMPAGLIFLOZIN 10 MG PO TABS
10.0000 mg | ORAL_TABLET | Freq: Every day | ORAL | 0 refills | Status: DC
  Filled 2023-05-23: qty 30, 30d supply, fill #0

## 2023-05-23 MED ORDER — METFORMIN HCL 500 MG PO TABS: 500.0000 mg | ORAL_TABLET | Freq: Two times a day (BID) | ORAL | Status: DC

## 2023-05-23 MED ORDER — METFORMIN HCL 500 MG PO TABS
500.0000 mg | ORAL_TABLET | Freq: Two times a day (BID) | ORAL | 0 refills | Status: DC
  Filled 2023-05-23: qty 60, 30d supply, fill #0

## 2023-05-23 MED ORDER — APIXABAN (ELIQUIS) VTE STARTER PACK (10MG AND 5MG)
ORAL_TABLET | ORAL | 0 refills | Status: DC
  Filled 2023-05-23: qty 74, 30d supply, fill #0

## 2023-05-23 MED ORDER — DAPAGLIFLOZIN PROPANEDIOL 5 MG PO TABS: 5.0000 mg | ORAL_TABLET | Freq: Every day | ORAL | Status: DC

## 2023-05-23 MED ORDER — PANTOPRAZOLE SODIUM 40 MG PO TBEC
40.0000 mg | DELAYED_RELEASE_TABLET | Freq: Every day | ORAL | 0 refills | Status: AC
  Filled 2023-05-23: qty 90, 90d supply, fill #0

## 2023-05-23 NOTE — Discharge Instructions (Addendum)
Information on my medicine - ELIQUIS (apixaban)  This medication education was reviewed with me or my healthcare representative as part of my discharge preparation.  The pharmacist that spoke with me during my hospital stay was:  Kaytlynne Neace A Dagen Beevers, RPH  Why was Eliquis prescribed for you? Eliquis was prescribed to treat blood clots that may have been found in the veins of your legs (deep vein thrombosis) or in your lungs (pulmonary embolism) and to reduce the risk of them occurring again.  What do You need to know about Eliquis ? The starting dose is 10 mg (two 5 mg tablets) taken TWICE daily for the FIRST SEVEN (7) DAYS, then on 05/29/23 the dose is reduced to ONE 5 mg tablet taken TWICE daily.  Eliquis may be taken with or without food.   Try to take the dose about the same time in the morning and in the evening. If you have difficulty swallowing the tablet whole please discuss with your pharmacist how to take the medication safely.  Take Eliquis exactly as prescribed and DO NOT stop taking Eliquis without talking to the doctor who prescribed the medication.  Stopping may increase your risk of developing a new blood clot.  Refill your prescription before you run out.  After discharge, you should have regular check-up appointments with your healthcare provider that is prescribing your Eliquis.    What do you do if you miss a dose? If a dose of ELIQUIS is not taken at the scheduled time, take it as soon as possible on the same day and twice-daily administration should be resumed. The dose should not be doubled to make up for a missed dose.  Important Safety Information A possible side effect of Eliquis is bleeding. You should call your healthcare provider right away if you experience any of the following: Bleeding from an injury or your nose that does not stop. Unusual colored urine (red or dark brown) or unusual colored stools (red or black). Unusual bruising for unknown reasons. A  serious fall or if you hit your head (even if there is no bleeding).  Some medicines may interact with Eliquis and might increase your risk of bleeding or clotting while on Eliquis. To help avoid this, consult your healthcare provider or pharmacist prior to using any new prescription or non-prescription medications, including herbals, vitamins, non-steroidal anti-inflammatory drugs (NSAIDs) and supplements.  This website has more information on Eliquis (apixaban): http://[email or web address].com/eliquis/home

## 2023-05-23 NOTE — Progress Notes (Addendum)
Progress Note  Patient Name: [name] [name] Date of Encounter: 05/23/2023  Primary Cardiologist: Olga Millers, MD   Subjective   Patient seen examined his bedside.  He was undergoing therapy when I arrived.  No complaints at this time.  Wife by the bedside.  Inpatient Medications    Scheduled Meds:   stroke: early stages of recovery book   Does not apply Once   abiraterone acetate  250 mg Oral Daily   apixaban  10 mg Oral BID   Followed by   Melene Muller ON 05/29/2023] apixaban  5 mg Oral BID   atorvastatin  40 mg Oral Daily   insulin aspart  0-15 Units Subcutaneous TID WC   insulin aspart  0-5 Units Subcutaneous QHS   insulin glargine-yfgn  10 Units Subcutaneous Daily   pantoprazole  20 mg Oral Daily   predniSONE  5 mg Oral Q breakfast   Continuous Infusions:  PRN Meds: acetaminophen **OR** acetaminophen (TYLENOL) oral liquid 160 mg/5 mL **OR** acetaminophen, senna-docusate   Vital Signs    Vitals:   05/22/23 1615 05/22/23 1615 05/22/23 2000 05/23/23 0000  BP: 129/85   119/60  Pulse: 78   62  Resp: (!) 25   18  Temp:  99 F (37.2 C) 99 F (37.2 C) 98.8 F (37.1 C)  TempSrc:  Oral Oral Oral  SpO2: 100%   94%  Weight:      Height:       No intake or output data in the 24 hours ending 05/23/23 0758 Filed Weights   05/21/23 1440  Weight: 77.1 kg    Telemetry    Sinus rhythm- Personally Reviewed  ECG     - Personally Reviewed  Physical Exam   General: Comfortable, sitting up in a chair Head: Atraumatic, normal size  Eyes: PEERLA, EOMI  Neck: Supple, normal JVD Cardiac: Normal S1, S2; RRR; no murmurs, rubs, or gallops Lungs: Clear to auscultation bilaterally Abd: Soft, nontender, no hepatomegaly  Ext: warm, no edema Musculoskeletal: No deformities, BUE and BLE strength normal and equal Skin: Warm and dry, no rashes   Neuro: Alert and oriented to person, place, time, and situation, CNII-XII grossly intact, no focal deficits  Psych: Normal mood and  affect   Labs    Chemistry Recent Labs  Lab 05/21/23 1222 05/21/23 1227 05/21/23 1705 05/21/23 1706 05/22/23 1024  NA 140 139 140 140 139  K 4.3 4.3 3.6 3.7 3.8  CL 103 105  --  105 106  CO2 19*  --   --   --  22  GLUCOSE 202* 204*  --  174* 322*  BUN 15 20  --  13 11  CREATININE 0.90 0.80  --  0.70 0.84  CALCIUM 9.4  --   --   --  9.0  PROT 6.5  --   --   --   --   ALBUMIN 3.6  --   --   --   --   AST 24  --   --   --   --   ALT 18  --   --   --   --   ALKPHOS 64  --   --   --   --   BILITOT 1.5*  --   --   --   --   GFRNONAA >60  --   --   --  >60  ANIONGAP 18*  --   --   --  11  Hematology Recent Labs  Lab 05/21/23 1222 05/21/23 1227 05/21/23 1705 05/21/23 1706 05/22/23 1024  WBC 9.1  --   --   --  9.9  RBC 4.51  --   --   --  4.24  HGB 15.5   < > 14.3 14.3 14.7  HCT 46.4   < > 42.0 42.0 41.5  MCV 102.9*  --   --   --  97.9  MCH 34.4*  --   --   --  34.7*  MCHC 33.4  --   --   --  35.4  RDW 12.6  --   --   --  12.6  PLT 299  --   --   --  308   < > = values in this interval not displayed.    Cardiac EnzymesNo results for input(s): "TROPONINI" in the last 168 hours. No results for input(s): "TROPIPOC" in the last 168 hours.   BNPNo results for input(s): "BNP", "PROBNP" in the last 168 hours.   DDimer No results for input(s): "DDIMER" in the last 168 hours.   Radiology    ECHOCARDIOGRAM COMPLETE Result Date: 05/22/2023    ECHOCARDIOGRAM REPORT   Patient Name:   [name] [name] Date of Exam: 05/22/2023 Medical Rec #:  [id number]    Height:       65.0 in Accession #:    [id number]   Weight:       170.0 lb Date of Birth:  [birth year]    BSA:          1.846 m Patient Age:    77 years     BP:           119/73 mmHg Patient Gender: M            HR:           62 bpm. Exam Location:  Inpatient Procedure: 2D Echo, Cardiac Doppler, Color Doppler and Intracardiac            Opacification Agent Indications:    stroke  History:        Patient has prior history of  Echocardiogram examinations, most                 recent 10/07/2021. CAD, Prior CABG; PAD.  Sonographer:    Melissa Morford RDCS (AE, PE) Referring Phys: [id number] [facility] GOEL  Sonographer Comments: Apical window technically limited IMPRESSIONS  1. Hypokinesis of the distal anteroseptal wall and apex with overall mild LV dyfunction.  2. Left ventricular ejection fraction, by estimation, is 45 to 50%. The left ventricle has mildly decreased function. The left ventricle demonstrates regional wall motion abnormalities (see scoring diagram/findings for description). Left ventricular diastolic parameters are consistent with Grade I diastolic dysfunction (impaired relaxation).  3. Right ventricular systolic function is normal. The right ventricular size is normal.  4. The mitral valve is normal in structure. No evidence of mitral valve regurgitation. No evidence of mitral stenosis.  5. The aortic valve is tricuspid. Aortic valve regurgitation is not visualized. Aortic valve sclerosis is present, with no evidence of aortic valve stenosis. FINDINGS  Left Ventricle: Left ventricular ejection fraction, by estimation, is 45 to 50%. The left ventricle has mildly decreased function. The left ventricle demonstrates regional wall motion abnormalities. Definity contrast agent was given IV to delineate the left ventricular endocardial borders. The left ventricular internal cavity size was normal in size. There is no left ventricular hypertrophy. Left ventricular diastolic parameters are consistent with Grade I  diastolic dysfunction (impaired relaxation). Right Ventricle: The right ventricular size is normal. Right ventricular systolic function is normal. Left Atrium: Left atrial size was normal in size. Right Atrium: Right atrial size was normal in size. Pericardium: There is no evidence of pericardial effusion. Mitral Valve: The mitral valve is normal in structure. No evidence of mitral valve regurgitation. No evidence of mitral valve  stenosis. Tricuspid Valve: The tricuspid valve is normal in structure. Tricuspid valve regurgitation is not demonstrated. No evidence of tricuspid stenosis. Aortic Valve: The aortic valve is tricuspid. Aortic valve regurgitation is not visualized. Aortic valve sclerosis is present, with no evidence of aortic valve stenosis. Pulmonic Valve: The pulmonic valve was normal in structure. Pulmonic valve regurgitation is not visualized. No evidence of pulmonic stenosis. Aorta: The aortic root is normal in size and structure. Venous: The inferior vena cava was not well visualized. IAS/Shunts: The interatrial septum was not well visualized. Additional Comments: Hypokinesis of the distal anteroseptal wall and apex with overall mild LV dyfunction.  LEFT VENTRICLE PLAX 2D LVIDd:         4.30 cm      Diastology LVIDs:         3.90 cm      LV e' medial:    4.13 cm/s LV PW:         0.80 cm      LV E/e' medial:  10.4 LV IVS:        0.90 cm      LV e' lateral:   5.11 cm/s LVOT diam:     2.00 cm      LV E/e' lateral: 8.4 LV SV:         38 LV SV Index:   20 LVOT Area:     3.14 cm  LV Volumes (MOD) LV vol d, MOD A2C: 111.0 ml LV vol d, MOD A4C: 141.5 ml LV vol s, MOD A2C: 59.0 ml LV vol s, MOD A4C: 91.6 ml LV SV MOD A2C:     52.0 ml LV SV MOD A4C:     141.5 ml LV SV MOD BP:      46.3 ml LEFT ATRIUM           Index        RIGHT ATRIUM           Index LA diam:      3.40 cm 1.84 cm/m   RA Area:     20.60 cm LA Vol (A4C): 33.9 ml 18.36 ml/m  RA Volume:   67.30 ml  36.46 ml/m  AORTIC VALVE LVOT Vmax:   70.50 cm/s LVOT Vmean:  48.100 cm/s LVOT VTI:    0.120 m  AORTA Ao Root diam: 3.30 cm Ao Asc diam:  3.35 cm MITRAL VALVE MV Area (PHT): 5.42 cm    SHUNTS MV Decel Time: 140 msec    Systemic VTI:  0.12 m MV E velocity: 42.80 cm/s  Systemic Diam: 2.00 cm MV A velocity: 66.00 cm/s MV E/A ratio:  0.65 Olga Millers MD Electronically signed by Olga Millers MD Signature Date/Time: 05/22/2023/1:52:32 PM    Final    VAS [place] LOWER EXTREMITY  VENOUS (DVT) Result Date: 05/22/2023  Lower Venous DVT Study Patient Name:  [name] [name]  Date of Exam:   05/22/2023 Medical Rec #: [id number]     Accession #:    [id number] Date of Birth: 1946-01-25     Patient Gender: M Patient Age:   25 years Exam Location:  [facility] Procedure:      VAS [place] LOWER EXTREMITY VENOUS (DVT) Referring Phys: CORTNEY DE LA TORRE --------------------------------------------------------------------------------  Indications: Stroke. Other Indications: CABG, prior CVA. Comparison Study: No prior exam. Performing Technologist: Fernande Bras  Examination Guidelines: A complete evaluation includes B-mode imaging, spectral Doppler, color Doppler, and power Doppler as needed of all accessible portions of each vessel. Bilateral testing is considered an integral part of a complete examination. Limited examinations for reoccurring indications may be performed as noted. The reflux portion of the exam is performed with the patient in reverse Trendelenburg.  +---------+---------------+---------+-----------+----------+--------------+ RIGHT    CompressibilityPhasicitySpontaneityPropertiesThrombus Aging +---------+---------------+---------+-----------+----------+--------------+ CFV      Full           Yes      Yes                                 +---------+---------------+---------+-----------+----------+--------------+ SFJ      Full           Yes      Yes                                 +---------+---------------+---------+-----------+----------+--------------+ FV Prox  Full                                                        +---------+---------------+---------+-----------+----------+--------------+ FV Mid   Full                                                        +---------+---------------+---------+-----------+----------+--------------+ FV DistalFull                                                         +---------+---------------+---------+-----------+----------+--------------+ PFV      Full                                                        +---------+---------------+---------+-----------+----------+--------------+ POP      Full           No       No                                  +---------+---------------+---------+-----------+----------+--------------+ PTV      Full                                                        +---------+---------------+---------+-----------+----------+--------------+ PERO     Full                                                        +---------+---------------+---------+-----------+----------+--------------+   +---------+---------------+---------+-----------+----------+--------------+  LEFT     CompressibilityPhasicitySpontaneityPropertiesThrombus Aging +---------+---------------+---------+-----------+----------+--------------+ CFV      Partial        Yes      Yes                                 +---------+---------------+---------+-----------+----------+--------------+ SFJ      Full           Yes      Yes                                 +---------+---------------+---------+-----------+----------+--------------+ FV Prox  Full           Yes      Yes                                 +---------+---------------+---------+-----------+----------+--------------+ FV Mid   Full                                                        +---------+---------------+---------+-----------+----------+--------------+ FV DistalFull                                                        +---------+---------------+---------+-----------+----------+--------------+ PFV      Partial        Yes      Yes                                 +---------+---------------+---------+-----------+----------+--------------+ POP      Full           Yes      Yes                                  +---------+---------------+---------+-----------+----------+--------------+ PTV      Full                                                        +---------+---------------+---------+-----------+----------+--------------+ PERO     Full                                                        +---------+---------------+---------+-----------+----------+--------------+     Summary: RIGHT: - There is no evidence of deep vein thrombosis in the lower extremity.  - No cystic structure found in the popliteal fossa.  LEFT: - Findings consistent with acute deep vein thrombosis involving the left common femoral vein, and left proximal profunda vein.  - No cystic structure found in the popliteal fossa.  *See table(s) above for measurements and observations. Electronically signed by  Carolynn Sayers on 05/22/2023 at 12:19:50 PM.    Final    CT ANGIO HEAD NECK W WO CM Result Date: 05/21/2023 CLINICAL DATA:  Acute anterior left MCA territory infarct. EXAM: CT ANGIOGRAPHY HEAD AND NECK WITH AND WITHOUT CONTRAST TECHNIQUE: Multidetector CT imaging of the head and neck was performed using the standard protocol during bolus administration of intravenous contrast. Multiplanar CT image reconstructions and MIPs were obtained to evaluate the vascular anatomy. Carotid stenosis measurements (when applicable) are obtained utilizing NASCET criteria, using the distal internal carotid diameter as the denominator. RADIATION DOSE REDUCTION: This exam was performed according to the departmental dose-optimization program which includes automated exposure control, adjustment of the mA and/or kV according to patient size and/or use of iterative reconstruction technique. CONTRAST:  75mL OMNIPAQUE IOHEXOL 350 MG/ML SOLN COMPARISON:  CT head without contrast 05/21/2023. MR head without contrast 05/21/2023. CT angio head and neck 10/07/2021. FINDINGS: CTA NECK FINDINGS Aortic arch: Common origin of the left common carotid artery and innominate  artery is again noted. Atherosclerotic calcifications are present at the origin left subclavian artery without significant stenosis. Additional calcifications are present in the distal aortic arch. No aneurysm or dissection is present. Right carotid system: The right common carotid artery is within normal limits. The bifurcation scratched at atherosclerotic changes are present at the right carotid bifurcation without significant stenosis. The cervical right ICA is otherwise normal. Left carotid system: The left common carotid artery is within normal limits. Atherosclerotic changes are present at the bifurcation. No significant stenosis is present. Cervical left ICA is normal. Vertebral arteries: The right vertebral artery is dominant. Both vertebral arteries originate from the subclavian arteries without significant focal stenosis at either vertebral artery origin. Progressive high-grade stenoses are present in the proximal V1 segments bilaterally. The V2 segments are normal without tandem stenoses. Skeleton: Vertebral body heights and alignment are normal. Reversal of the normal cervical lordosis is present. No focal osseous lesions are present. Other neck: Soft tissues the neck are otherwise unremarkable. Salivary glands are within normal limits. Thyroid is normal. No significant adenopathy is present. No focal mucosal or submucosal lesions are present. Upper chest: The lung apices are clear.  Median sternotomy is noted. Review of the MIP images confirms the above findings CTA HEAD FINDINGS Anterior circulation: Atherosclerotic calcifications are present within the cavernous internal carotid arteries bilaterally. Moderate right-sided stenosis is present. Moderate stenoses are present bilaterally. The anterior genu and supraclinoid ICA are normal bilaterally. The A1 and M1 segments are normal. No anterior communicating artery is visible. The MCA bifurcations are within normal limits bilaterally. The ACA and MCA  branch vessels are within normal limits. No significant proximal stenosis or occlusion is present. No aneurysm is present. Posterior circulation: Moderate stenoses are present at the dural margin of both vertebral arteries. The distal V4 segments are hypoplastic. The basilar artery is more hypoplastic than on the prior study. The superior cerebellar arteries are patent. The left posterior cerebral artery originates from the basilar tip. The right posterior cerebral artery is of fetal type. Moderate stenosis is present in the distal left P2 segment just proximal to the bifurcation. Right PCA branch vessels are within normal limits. Venous sinuses: The dural sinuses are patent. The straight sinus and deep cerebral veins are intact. Cortical veins are within normal limits. No significant vascular malformation is evident. Anatomic variants: Fetal type right posterior cerebral artery. Review of the MIP images confirms the above findings IMPRESSION: 1. Progressive high-grade stenoses in the proximal V1 segments  bilaterally. 2. Moderate stenoses at the dural margin of both vertebral arteries and decreased size of the more distal basilar artery, likely reflecting decreased perfusion. 3. 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 4. Moderate stenosis in the distal left P2 segment just proximal to the bifurcation. 5. Atherosclerotic changes at the carotid bifurcations bilaterally without significant stenosis. 6.  Aortic Atherosclerosis (ICD10-I70.0). Electronically Signed   By: Marin Roberts M.D.   On: 05/21/2023 16:34   MR BRAIN WO CONTRAST Result Date: 05/21/2023 CLINICAL DATA:  Stroke, follow-up. Acute onset of confusion and slurred speech. EXAM: MRI HEAD WITHOUT CONTRAST TECHNIQUE: Multiplanar, multiecho pulse sequences of the brain and surrounding structures were obtained without intravenous contrast. COMPARISON:  CT head without contrast 05/21/2023. MR head without and with  contrast 10/06/2021. FINDINGS: Brain: The suspected acute infarct involving the left middle frontal gyrus is confirmed on diffusion-weighted images. T2 and FLAIR hyperintensities are associated with this infarct. The infarct is posterior to the previous left superior frontal gyrus infarct. Remote encephalomalacia is present in the left parietal lobe. Periventricular and subcortical T2 hyperintensities are otherwise mildly advanced for age. The ventricles are of normal size. No significant extraaxial fluid collection is present. Remote lacunar infarcts are present in the right paramedian pons. The brainstem and cerebellum are otherwise within normal limits. Vascular: Flow is present in the major intracranial arteries. Skull and upper cervical spine: The craniocervical junction is normal. Upper cervical spine is within normal limits. Marrow signal is unremarkable. Sinuses/Orbits: Polyp or mucous retention cyst is present in the inferior left maxillary sinus. The paranasal sinuses and mastoid air cells are otherwise clear. Bilateral lens replacements are noted. Globes and orbits are otherwise unremarkable. IMPRESSION: 1. Acute/subacute infarct of the left middle frontal gyrus. 2. Remote encephalomalacia of the left superior frontal gyrus and left parietal lobe. 3. 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 4. Periventricular and subcortical T2 hyperintensities bilaterally are otherwise mildly advanced for age. This likely reflects the sequela of chronic microvascular ischemia. Electronically Signed   By: Marin Roberts M.D.   On: 05/21/2023 15:47   CT HEAD CODE STROKE WO CONTRAST Result Date: 05/21/2023 CLINICAL DATA:  Code stroke. Neuro deficit, acute, stroke suspected. Personal history of prostate cancer. EXAM: CT HEAD WITHOUT CONTRAST TECHNIQUE: Contiguous axial images were obtained from the base of the skull through the vertex without intravenous contrast. RADIATION DOSE REDUCTION: This exam was  performed according to the departmental dose-optimization program which includes automated exposure control, adjustment of the mA and/or kV according to patient size and/or use of iterative reconstruction technique. COMPARISON:  CT head without contrast and MR head without and with contrast 10/06/2021 FINDINGS: Brain: Remote infarcts of the high left frontal and parietal lobes demonstrate expected evolution. New areas of cortical hypoattenuation are present in the left frontal operculum just posterior to the more remote infarct. No acute hemorrhage or mass lesion is present. Basal ganglia are intact. The insular cortex is normal. The brainstem and cerebellum are within normal limits. Midline structures are within normal limits. Vascular: Atherosclerotic calcifications are present within the cavernous internal carotid arteries bilaterally. No hyperdense vessel is present. Skull: Calvarium is intact. No focal lytic or blastic lesions are present. No significant extracranial soft tissue lesion is present. Sinuses/Orbits: The paranasal sinuses and mastoid air cells are clear. The globes and orbits are within normal limits. Other: ASPECTS ([place] Stroke Program Early CT Score) - Ganglionic level infarction (caudate, lentiform nuclei, internal capsule, insula, M1-M3 cortex): 2/3 - Supraganglionic  infarction (M4-M6 cortex): 7/7 Total score (0-10 with 10 being normal): 9/10 IMPRESSION: 1. New areas of cortical hypoattenuation in the left frontal operculum just posterior to the more remote infarct. This likely represents acute or subacute infarct. 2. Remote infarcts of the high left frontal and parietal lobes demonstrate expected evolution. 3. Aspects is 9/10. The above was relayed via text pager to Dr. Otelia Limes on 05/21/2023 at 12:46 . Electronically Signed   By: Marin Roberts M.D.   On: 05/21/2023 12:46    Cardiac Studies   Echo reviewed  Patient Profile     78 y.o. male with history of CAD status post CABG  in 2017, history of CVA, PAD, prediabetes, metastatic prostate cancer.  Admitted for CVA recently known to have recent depressed ejection fraction.  Assessment & Plan    Acute heart failure with reduced ejection  Depressed ejection Fraction EF 45-50% Acute CVA  CAD s/p CABG 2017 Acute left common femoral proximal profunda DVT Metastatic Protstate CA Diabetes   Remains clinically stable, euvolemic no evidence of volume overload.  No need for any active diuretics at this time.  Clinically he does not appears to be volume overloaded.  At this time do not think this patient needs to be diuresed.  Echo noted wall motion abnormalities but at this time in the setting of his acute stroke we will hold off on any ischemic evaluation he does not have any anginal symptoms at this time. Once permissive hypertension is over he will benefit from starting guideline directed therapy with low-dose beta-blocker (Toprol-XL 12.5 mg) and Entresto (24-26 mg twice daily). Will be on  Plavix and Eliquis (for DVT) Diabetes per primary team.     For questions or updates, please contact [facility] Please consult [email or web address] for contact info under Cardiology/STEMI.      Signed, Thomasene Ripple, DO  05/23/2023, 7:58 AM

## 2023-05-23 NOTE — Telephone Encounter (Signed)
Pharmacy Patient Advocate Encounter  Received notification from [facility] that Prior Authorization for [facility] 3 Sensor has been APPROVED from 05/22/2023 to 04/10/2024. Ran test claim, Copay is $0.00. This test claim was processed through [facility]- copay amounts may vary at other pharmacies due to pharmacy/plan contracts, or as the patient moves through the different stages of their insurance plan.   PA #/Case ID/Reference #: [id number]

## 2023-05-23 NOTE — TOC Transition Note (Signed)
Transition of Care [facility]) - Discharge Note   Patient Details  Name: [name] [name] MRN: [id number] Date of Birth: 08-05-1945  Transition of Care [facility]) CM/SW Contact:  Gordy Clement, RN Phone Number: 05/23/2023, 1:53 PM   Clinical Narrative:     Patient will DC to home today.  OP PT / OT and SLP have been recommended and referral made- AVS updated Patient is declining DME recommended . Spouse to transport  No additional TOC needs           Patient Goals and CMS Choice            Discharge Placement                       Discharge Plan and Services Additional resources added to the After Visit Summary for                                       Social Drivers of Health (SDOH) Interventions SDOH Screenings   Food Insecurity: Patient Declined (05/22/2023)  Housing: Patient Declined (05/22/2023)  Transportation Needs: Patient Declined (05/22/2023)  Utilities: Patient Declined (05/22/2023)  Depression (PHQ2-9): Low Risk  (10/14/2022)  Financial Resource Strain: Low Risk  (10/11/2022)  Physical Activity: Unknown (10/11/2022)  Social Connections: Patient Declined (05/22/2023)  Stress: No Stress Concern Present (10/11/2022)  Tobacco Use: Medium Risk (05/21/2023)     Readmission Risk Interventions     No data to display

## 2023-05-23 NOTE — Evaluation (Signed)
Physical Therapy Brief Evaluation and Discharge Note Patient Details Name: [name] [name] MRN: [id number] DOB: [birth year] Today's Date: 05/23/2023   History of Present Illness  [name] [name] is a 78 y.o. male noted to have marked difficulty speaking, slurred speech as well as increased confusion prompting the patient to be brought to [facility], ER. An MRI has been done which shows left middle frontal gyrus acute/subacute infarction. Past medical history significant of known prostate cancer, prior strokes.  Clinical Impression  Pt presents with admitting diagnosis above. Pt today was able to ambulate in hallway with no AD supervision/CGA. Pt also able to navigate stairs CGA. PTA pt reports he was fully independent. Pt presents at or near baseline mobility. Pt has no further acute PT needs and will be signing off. Recommend OPPT upon DC. Re consult PT if mobility status changes. Pt would benefit from continued mobility with mobility specialist during acute stay.        PT Assessment All further PT needs can be met in the next venue of care  Assistance Needed at Discharge  PRN    Equipment Recommendations None recommended by PT  Recommendations for Other Services       Precautions/Restrictions Precautions Precautions: Fall Restrictions Weight Bearing Restrictions Per Provider Order: No        Mobility  Bed Mobility   Supine/Sidelying to sit: Modified independent (Device/Increased time) Sit to supine/sidelying: Modified independent (Device/Increased time)    Transfers Overall transfer level: Independent Equipment used: None                    Ambulation/Gait Ambulation/Gait assistance: Supervision, Contact guard assist Gait Distance (Feet): 350 Feet Assistive device: None Gait Pattern/deviations: Drifts right/left, Antalgic, Decreased stride length, Step-through pattern, Decreased stance time - left Gait Speed: Pace WFL General Gait Details: Pt noted to walk with a  "limp" however states this is baseline after previous CVA 35 years ago.  Home Activity Instructions    Stairs Stairs: Yes Stairs assistance: Contact guard assist Stair Management: One rail Right, Forwards, Backwards, Step to pattern Number of Stairs: 3 General stair comments: no LOB noted.  Modified Rankin (Stroke Patients Only)        Balance Overall balance assessment: Mild deficits observed, not formally tested                        Pertinent Vitals/Pain   Pain Assessment Pain Assessment: No/denies pain     Home Living Family/patient expects to be discharged to:: Private residence Living Arrangements: Spouse/significant other Available Help at Discharge: Family;Available 24 hours/day Home Environment: Stairs to enter  [employer] of Steps: 5 Home Equipment: Agricultural consultant (2 wheels);Hand held shower head        Prior Function Level of Independence: Independent      UE/LE Assessment   UE ROM/Strength/Tone/Coordination: WFL    LE ROM/Strength/Tone/Coordination: [facility]      Communication   Communication Communication: Impaired Factors Affecting Communication: Reduced clarity of speech     Cognition Overall Cognitive Status: Appears within functional limits for tasks assessed/performed       General Comments General comments (skin integrity, edema, etc.): VSS    Exercises     Assessment/Plan    PT Problem List Decreased strength;Decreased range of motion;Decreased activity tolerance;Decreased balance;Decreased mobility;Decreased coordination;Decreased knowledge of use of DME;Decreased safety awareness;Decreased knowledge of precautions;Cardiopulmonary status limiting activity       PT Visit Diagnosis Other abnormalities of gait and mobility (R26.89)  No Skilled PT     Co-evaluation                AMPAC 6 Clicks Help needed turning from your back to your side while in a flat bed without using bedrails?: None Help needed  moving from lying on your back to sitting on the side of a flat bed without using bedrails?: None Help needed moving to and from a bed to a chair (including a wheelchair)?: None Help needed standing up from a chair using your arms (e.g., wheelchair or bedside chair)?: None Help needed to walk in hospital room?: A Little Help needed climbing 3-5 steps with a railing? : A Little 6 Click Score: 22      End of Session Equipment Utilized During Treatment: Gait belt Activity Tolerance: Patient tolerated treatment well Patient left: in bed;with call bell/phone within reach;with family/visitor present Nurse Communication: Mobility status PT Visit Diagnosis: Other abnormalities of gait and mobility (R26.89)     Time: 7353-2992 PT Time Calculation (min) (ACUTE ONLY): 24 min  Charges:   PT Evaluation $PT Eval Moderate Complexity: 1 Mod PT Treatments $Gait Training: 8-22 mins    Shela Nevin, PT, DPT Acute Rehab Services [phone]   Gladys Damme  05/23/2023, 12:17 PM

## 2023-05-23 NOTE — Inpatient Diabetes Management (Signed)
Inpatient Diabetes Program Recommendations  AACE/ADA: New Consensus Statement on Inpatient Glycemic Control (2015)  Target Ranges:  Prepandial:   less than 140 mg/dL      Peak postprandial:   less than 180 mg/dL (1-2 hours)      Critically ill patients:  140 - 180 mg/dL   Lab Results  Component Value Date   GLUCAP 189 (H) 05/23/2023   HGBA1C 11.5 (H) 05/21/2023    Review of Glycemic Control  Spoke with pt and wife again for reinforcement of education. Will go home on orals and [employer] CGM sample. Pt already has a fingerstick glucometer at home. Pt to watch glucose trends for the rest of this week and contact PCP with possible adjustments in medication the beginning of next week.   Thanks,  Christena Deem RN, MSN, BC-ADM Inpatient Diabetes Coordinator Team Pager [phone] (8a-5p)

## 2023-05-23 NOTE — Telephone Encounter (Signed)
Patient Product/process development scientist completed.    The patient is insured through [employer]. Patient has Medicare and is not eligible for a copay card, but may be able to apply for patient assistance or Medicare RX Payment Plan (Patient Must reach out to their plan, if eligible for payment plan), if available.    Ran test claim for Eliquis Starter Pack  and the current 30 day co-pay is $40.00.   This test claim was processed through [facility]- copay amounts may vary at other pharmacies due to pharmacy/plan contracts, or as the patient moves through the different stages of their insurance plan.     Roland Earl, CPHT Pharmacy Technician III Certified Patient Advocate [facility] Patient Advocate Team Direct Number: [phone]  Fax: [phone]

## 2023-05-23 NOTE — Discharge Summary (Signed)
Physician Discharge Summary  [name] [name] ZOX:[id number] DOB: [birth year] DOA: 05/21/2023  PCP: Nelwyn Salisbury, MD  Admit date: 05/21/2023 Discharge date: 05/23/2023  Admitted From: (Home) Disposition:  (Home)  Recommendations for Outpatient Follow-up:  Follow up with PCP in 1-2 weeks Please obtain BMP/CBC in one week Please adjust hypoglycemic regimen as needed Please consider Toprol-XL and Entresto once blood pressure is stable    Diet recommendation: Heart Healthy / Carb Modified   Brief/Interim Summary:  78yo with h/o metastatic prostate CA, prior CVA, CAD, and DM who presented on 2/9 with word finding difficulties and dizziness, found to have acute/subacute infarct in the the L middle frontal gyrus. Neurology consulted. He was taking ASA and Plavix on alternating days PTA. DVT US with LLE DVT, started on Eliquis.    Acute CVA -Patient with acute ischemic infarct, and left middle frontal gyrus infarct, most likely in the setting of embolic secondary to hypercoagulable state from prostate cancer -CTA head and neck with high-grade stenosis in proximal V1 segment,moderate stenoses at dural margin of both vertebral arteries, moderate stenoses of cavernous ICAs bilaterally, moderate stenosis in distal left P2 segment  -MRI acute/subacute infarct of the left middle frontal gyrus, remote encephalomalacia of left superior frontal gyrus and left parietal lobe, remote lacunar infarct of right paramedian pons, chronic microvascular ischemic disease -2D Echo EF 45 to 50%, hypokinesis of distal anteroseptal wall and apex with mild LV dysfunction, grade 1 diastolic dysfunction, normal left atrial size, interatrial septum not well-visualized -Bilateral lower extremity ultrasound, DVT in left common femoral vein and left proximal profunda vein -Patient on aspirin and Plavix on alternating days at home prior to admission, now on Eliquis and Plavix on discharge -LDL is 142, started on statin -A1c is 11.5,  please see discussion below   LLE DVT Extensive acute DVT of L common femoral vein and L proximal profunda vein Likely related to his underlying malignancy Start Eliquis for DVT and CVA treatment at this time as discussed with neurology   Acute heart failure with reduced EF Depressed ejection fraction 45 to 50% Abnormal echo with regional wall motion abnormality History of CAD status post CABG 2017 -Allow for permissive hypertension, blood pressure soft, no room currently to start meds, with recommendation to start low-dose beta-blockers and Entresto when blood pressure allows as an outpatient -Will start on Jardiance both for CHF and diabetes -Continue with Plavix -Euvolemic, no need for diuresis -Ischemic workup as an outpatient when stable    HTN Allow permissive HTN for now Blood pressure acceptable to soft, no meds initiated, consider Toprol-XL and Entresto as an outpatient when blood pressure has improved   HLD LDL is 142, poor control Start Lipitor 40 mg daily   DM poorly controlled with hyperglycemia A1c is 11.5, poor control - likely related to daily prednisone, as prior A1c was 6.1 Patient unaware of diabetes mellitus diagnosis -Will try Jardiance and metformin on discharge, but if remains persistently elevated with these meds and diet compliance then will need insulin    Metastatic Prostate CA Reports difficulty tolerating Zytiga so dose was recently changed Also on daily prednisone  PET scan in 10/2022 with 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 and diffuse lymph node as well as probably the lungs   Discharge Diagnoses:  Principal Problem:   Acute CVA (cerebrovascular accident) (HCC) Active Problems:   Dyslipidemia, goal LDL below 70   Prostate cancer (HCC)   Left femoral vein DVT (HCC)   Abnormal echocardiogram   DNR (do not resuscitate)  Discharge Instructions  Discharge Instructions     Diet - low sodium heart healthy   Complete by: As directed    Discharge instructions    Complete by: As directed    Follow with Primary MD Nelwyn Salisbury, MD in 7 days   Get CBC, CMP,  checked  by Primary MD next visit.    Activity: As tolerated with Full fall precautions use walker/cane & assistance as needed   Disposition Home   Diet: Heart Healthy /carb modified.  For Heart failure patients - Check your Weight same time everyday, if you gain over 2 pounds, or you develop in leg swelling, experience more shortness of breath or chest pain, call your Primary MD immediately. Follow Cardiac Low Salt Diet and 1.5 lit/day fluid restriction.   On your next visit with your primary care physician please Get Medicines reviewed and adjusted.   Please request your Prim.MD to go over all Hospital Tests and Procedure/Radiological results at the follow up, please get all Hospital records sent to your Prim MD by signing hospital release before you go home.   If you experience worsening of your admission symptoms, develop shortness of breath, life threatening emergency, suicidal or homicidal thoughts you must seek medical attention immediately by calling 911 or calling your MD immediately  if symptoms less severe.  You Must read complete instructions/literature along with all the possible adverse reactions/side effects for all the Medicines you take and that have been prescribed to you. Take any new Medicines after you have completely understood and accpet all the possible adverse reactions/side effects.   Do not drive, operating heavy machinery, perform activities at heights, swimming or participation in water activities or provide baby sitting services if your were admitted for syncope or siezures until you have seen by Primary MD or a Neurologist and advised to do so again.  Do not drive when taking Pain medications.    Do not take more than prescribed Pain, Sleep and Anxiety Medications  Special Instructions: If you have smoked or chewed Tobacco  in the last 2 yrs please stop  smoking, stop any regular Alcohol  and or any Recreational drug use.  Wear Seat belts while driving.   Please note  You were cared for by a hospitalist during your hospital stay. If you have any questions about your discharge medications or the care you received while you were in the hospital after you are discharged, you can call the unit and asked to speak with the hospitalist on call if the hospitalist that took care of you is not available. Once you are discharged, your primary care physician will handle any further medical issues. Please note that NO REFILLS for any discharge medications will be authorized once you are discharged, as it is imperative that you return to your primary care physician (or establish a relationship with a primary care physician if you do not have one) for your aftercare needs so that they can reassess your need for medications and monitor your lab values.   Increase activity slowly   Complete by: As directed       Allergies as of 05/23/2023   No Known Allergies      Medication List     STOP taking these medications    aspirin EC 81 MG tablet       TAKE these medications    abiraterone acetate 250 MG tablet Commonly known as: ZYTIGA Take 4 tablets (1,000 mg total) by mouth daily. Take on an  empty stomach 1 hour before or 2 hours after a meal What changed: how much to take   acetaminophen 500 MG tablet Commonly known as: TYLENOL Take 1 tablet (500 mg total) by mouth every 6 (six) hours as needed.   atorvastatin 40 MG tablet Commonly known as: LIPITOR Take 1 tablet (40 mg total) by mouth daily. Start taking on: May 24, 2023   calcium carbonate 750 MG chewable tablet Commonly known as: TUMS EX Chew 0.5 tablets by mouth daily.   clopidogrel 75 MG tablet Commonly known as: PLAVIX Take 1 tablet (75 mg total) by mouth daily. What changed: when to take this   Eliquis DVT/PE Starter Pack Generic drug: Apixaban Starter Pack (10mg  and  5mg ) Take as directed on package: start with two-5mg  tablets twice daily for 7 days. On day 8, switch to one-5mg  tablet twice daily.   Jardiance 10 MG Tabs tablet Generic drug: empagliflozin Take 1 tablet (10 mg total) by mouth daily before breakfast.   metFORMIN 500 MG tablet Commonly known as: GLUCOPHAGE Take 1 tablet (500 mg total) by mouth 2 (two) times daily with a meal.   pantoprazole 40 MG tablet Commonly known as: Protonix Take 1 tablet (40 mg total) by mouth daily.   polyethylene glycol powder 17 GM/SCOOP powder Commonly known as: GLYCOLAX/MIRALAX Take 17 g by mouth in the morning and at bedtime.   predniSONE 5 MG tablet Commonly known as: DELTASONE Take 1 tablet (5 mg total) by mouth daily with breakfast.        Follow-up Information     [facility] Follow up.   Specialty: Rehabilitation Why: Please call and schedule an appointment to begin your outpatient therapies Contact information: [address] [place] [address] [phone]               No Known Allergies  Consultations: Neurology Cardiology   Procedures/Studies: ECHOCARDIOGRAM COMPLETE Result Date: 05/22/2023    ECHOCARDIOGRAM REPORT   Patient Name:   [name] [name] Date of Exam: 05/22/2023 Medical Rec #:  [id number]    Height:       65.0 in Accession #:    [id number]   Weight:       170.0 lb Date of Birth:  December 14, 1945    BSA:          1.846 m Patient Age:    77 years     BP:           119/73 mmHg Patient Gender: M            HR:           62 bpm. Exam Location:  Inpatient Procedure: 2D Echo, Cardiac Doppler, Color Doppler and Intracardiac            Opacification Agent Indications:    stroke  History:        Patient has prior history of Echocardiogram examinations, most                 recent 10/07/2021. CAD, Prior CABG; PAD.  Sonographer:    Melissa Morford RDCS (AE, PE) Referring Phys: [id number] [facility] GOEL  Sonographer  Comments: Apical window technically limited IMPRESSIONS  1. Hypokinesis of the distal anteroseptal wall and apex with overall mild LV dyfunction.  2. Left ventricular ejection fraction, by estimation, is 45 to 50%. The left ventricle has mildly decreased function. The left ventricle demonstrates regional wall motion abnormalities (see scoring diagram/findings for description). Left ventricular  diastolic parameters are consistent with Grade I diastolic dysfunction (impaired relaxation).  3. Right ventricular systolic function is normal. The right ventricular size is normal.  4. The mitral valve is normal in structure. No evidence of mitral valve regurgitation. No evidence of mitral stenosis.  5. The aortic valve is tricuspid. Aortic valve regurgitation is not visualized. Aortic valve sclerosis is present, with no evidence of aortic valve stenosis. FINDINGS  Left Ventricle: Left ventricular ejection fraction, by estimation, is 45 to 50%. The left ventricle has mildly decreased function. The left ventricle demonstrates regional wall motion abnormalities. Definity contrast agent was given IV to delineate the left ventricular endocardial borders. The left ventricular internal cavity size was normal in size. There is no left ventricular hypertrophy. Left ventricular diastolic parameters are consistent with Grade I diastolic dysfunction (impaired relaxation). Right Ventricle: The right ventricular size is normal. Right ventricular systolic function is normal. Left Atrium: Left atrial size was normal in size. Right Atrium: Right atrial size was normal in size. Pericardium: There is no evidence of pericardial effusion. Mitral Valve: The mitral valve is normal in structure. No evidence of mitral valve regurgitation. No evidence of mitral valve stenosis. Tricuspid Valve: The tricuspid valve is normal in structure. Tricuspid valve regurgitation is not demonstrated. No evidence of tricuspid stenosis. Aortic Valve: The aortic  valve is tricuspid. Aortic valve regurgitation is not visualized. Aortic valve sclerosis is present, with no evidence of aortic valve stenosis. Pulmonic Valve: The pulmonic valve was normal in structure. Pulmonic valve regurgitation is not visualized. No evidence of pulmonic stenosis. Aorta: The aortic root is normal in size and structure. Venous: The inferior vena cava was not well visualized. IAS/Shunts: The interatrial septum was not well visualized. Additional Comments: Hypokinesis of the distal anteroseptal wall and apex with overall mild LV dyfunction.  LEFT VENTRICLE PLAX 2D LVIDd:         4.30 cm      Diastology LVIDs:         3.90 cm      LV e' medial:    4.13 cm/s LV PW:         0.80 cm      LV E/e' medial:  10.4 LV IVS:        0.90 cm      LV e' lateral:   5.11 cm/s LVOT diam:     2.00 cm      LV E/e' lateral: 8.4 LV SV:         38 LV SV Index:   20 LVOT Area:     3.14 cm  LV Volumes (MOD) LV vol d, MOD A2C: 111.0 ml LV vol d, MOD A4C: 141.5 ml LV vol s, MOD A2C: 59.0 ml LV vol s, MOD A4C: 91.6 ml LV SV MOD A2C:     52.0 ml LV SV MOD A4C:     141.5 ml LV SV MOD BP:      46.3 ml LEFT ATRIUM           Index        RIGHT ATRIUM           Index LA diam:      3.40 cm 1.84 cm/m   RA Area:     20.60 cm LA Vol (A4C): 33.9 ml 18.36 ml/m  RA Volume:   67.30 ml  36.46 ml/m  AORTIC VALVE LVOT Vmax:   70.50 cm/s LVOT Vmean:  48.100 cm/s LVOT VTI:    0.120 m  AORTA  Ao Root diam: 3.30 cm Ao Asc diam:  3.35 cm MITRAL VALVE MV Area (PHT): 5.42 cm    SHUNTS MV Decel Time: 140 msec    Systemic VTI:  0.12 m MV E velocity: 42.80 cm/s  Systemic Diam: 2.00 cm MV A velocity: 66.00 cm/s MV E/A ratio:  0.65 Olga Millers MD Electronically signed by Olga Millers MD Signature Date/Time: 05/22/2023/1:52:32 PM    Final    VAS [place] LOWER EXTREMITY VENOUS (DVT) Result Date: 05/22/2023  Lower Venous DVT Study Patient Name:  [name] [name]  Date of Exam:   05/22/2023 Medical Rec #: [id number]     Accession #:    [id number] Date of  Birth: February 04, 1946     Patient Gender: M Patient Age:   89 years Exam Location:  [facility] Procedure:      VAS [place] LOWER EXTREMITY VENOUS (DVT) Referring Phys: Dewitt Hoes DE LA TORRE --------------------------------------------------------------------------------  Indications: Stroke. Other Indications: CABG, prior CVA. Comparison Study: No prior exam. Performing Technologist: Fernande Bras  Examination Guidelines: A complete evaluation includes B-mode imaging, spectral Doppler, color Doppler, and power Doppler as needed of all accessible portions of each vessel. Bilateral testing is considered an integral part of a complete examination. Limited examinations for reoccurring indications may be performed as noted. The reflux portion of the exam is performed with the patient in reverse Trendelenburg.  +---------+---------------+---------+-----------+----------+--------------+ RIGHT    CompressibilityPhasicitySpontaneityPropertiesThrombus Aging +---------+---------------+---------+-----------+----------+--------------+ CFV      Full           Yes      Yes                                 +---------+---------------+---------+-----------+----------+--------------+ SFJ      Full           Yes      Yes                                 +---------+---------------+---------+-----------+----------+--------------+ FV Prox  Full                                                        +---------+---------------+---------+-----------+----------+--------------+ FV Mid   Full                                                        +---------+---------------+---------+-----------+----------+--------------+ FV DistalFull                                                        +---------+---------------+---------+-----------+----------+--------------+ PFV      Full                                                         +---------+---------------+---------+-----------+----------+--------------+  POP      Full           No       No                                  +---------+---------------+---------+-----------+----------+--------------+ PTV      Full                                                        +---------+---------------+---------+-----------+----------+--------------+ PERO     Full                                                        +---------+---------------+---------+-----------+----------+--------------+   +---------+---------------+---------+-----------+----------+--------------+ LEFT     CompressibilityPhasicitySpontaneityPropertiesThrombus Aging +---------+---------------+---------+-----------+----------+--------------+ CFV      Partial        Yes      Yes                                 +---------+---------------+---------+-----------+----------+--------------+ SFJ      Full           Yes      Yes                                 +---------+---------------+---------+-----------+----------+--------------+ FV Prox  Full           Yes      Yes                                 +---------+---------------+---------+-----------+----------+--------------+ FV Mid   Full                                                        +---------+---------------+---------+-----------+----------+--------------+ FV DistalFull                                                        +---------+---------------+---------+-----------+----------+--------------+ PFV      Partial        Yes      Yes                                 +---------+---------------+---------+-----------+----------+--------------+ POP      Full           Yes      Yes                                 +---------+---------------+---------+-----------+----------+--------------+ PTV      Full                                                         +---------+---------------+---------+-----------+----------+--------------+  PERO     Full                                                        +---------+---------------+---------+-----------+----------+--------------+     Summary: RIGHT: - There is no evidence of deep vein thrombosis in the lower extremity.  - No cystic structure found in the popliteal fossa.  LEFT: - Findings consistent with acute deep vein thrombosis involving the left common femoral vein, and left proximal profunda vein.  - No cystic structure found in the popliteal fossa.  *See table(s) above for measurements and observations. Electronically signed by Carolynn Sayers on 05/22/2023 at 12:19:50 PM.    Final    CT ANGIO HEAD NECK W WO CM Result Date: 05/21/2023 CLINICAL DATA:  Acute anterior left MCA territory infarct. EXAM: CT ANGIOGRAPHY HEAD AND NECK WITH AND WITHOUT CONTRAST TECHNIQUE: Multidetector CT imaging of the head and neck was performed using the standard protocol during bolus administration of intravenous contrast. Multiplanar CT image reconstructions and MIPs were obtained to evaluate the vascular anatomy. Carotid stenosis measurements (when applicable) are obtained utilizing NASCET criteria, using the distal internal carotid diameter as the denominator. RADIATION DOSE REDUCTION: This exam was performed according to the departmental dose-optimization program which includes automated exposure control, adjustment of the mA and/or kV according to patient size and/or use of iterative reconstruction technique. CONTRAST:  75mL OMNIPAQUE IOHEXOL 350 MG/ML SOLN COMPARISON:  CT head without contrast 05/21/2023. MR head without contrast 05/21/2023. CT angio head and neck 10/07/2021. FINDINGS: CTA NECK FINDINGS Aortic arch: Common origin of the left common carotid artery and innominate artery is again noted. Atherosclerotic calcifications are present at the origin left subclavian artery without significant stenosis. Additional  calcifications are present in the distal aortic arch. No aneurysm or dissection is present. Right carotid system: The right common carotid artery is within normal limits. The bifurcation scratched at atherosclerotic changes are present at the right carotid bifurcation without significant stenosis. The cervical right ICA is otherwise normal. Left carotid system: The left common carotid artery is within normal limits. Atherosclerotic changes are present at the bifurcation. No significant stenosis is present. Cervical left ICA is normal. Vertebral arteries: The right vertebral artery is dominant. Both vertebral arteries originate from the subclavian arteries without significant focal stenosis at either vertebral artery origin. Progressive high-grade stenoses are present in the proximal V1 segments bilaterally. The V2 segments are normal without tandem stenoses. Skeleton: Vertebral body heights and alignment are normal. Reversal of the normal cervical lordosis is present. No focal osseous lesions are present. Other neck: Soft tissues the neck are otherwise unremarkable. Salivary glands are within normal limits. Thyroid is normal. No significant adenopathy is present. No focal mucosal or submucosal lesions are present. Upper chest: The lung apices are clear.  Median sternotomy is noted. Review of the MIP images confirms the above findings CTA HEAD FINDINGS Anterior circulation: Atherosclerotic calcifications are present within the cavernous internal carotid arteries bilaterally. Moderate right-sided stenosis is present. Moderate stenoses are present bilaterally. The anterior genu and supraclinoid ICA are normal bilaterally. The A1 and M1 segments are normal. No anterior communicating artery is visible. The MCA bifurcations are within normal limits bilaterally. The ACA and MCA branch vessels are within normal limits. No significant proximal stenosis or occlusion is present. No aneurysm is present. Posterior circulation:  Moderate stenoses are present at the dural margin of both vertebral arteries. The distal V4 segments are hypoplastic. The basilar artery is more hypoplastic than on the prior study. The superior cerebellar arteries are patent. The left posterior cerebral artery originates from the basilar tip. The right posterior cerebral artery is of fetal type. Moderate stenosis is present in the distal left P2 segment just proximal to the bifurcation. Right PCA branch vessels are within normal limits. Venous sinuses: The dural sinuses are patent. The straight sinus and deep cerebral veins are intact. Cortical veins are within normal limits. No significant vascular malformation is evident. Anatomic variants: Fetal type right posterior cerebral artery. Review of the MIP images confirms the above findings IMPRESSION: 1. 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 2. Moderate stenoses at the dural margin of both vertebral arteries and decreased size of the more distal basilar artery, likely reflecting decreased perfusion. 3. 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 4. Moderate stenosis in the distal left P2 segment just proximal to the bifurcation. 5. Atherosclerotic changes at the carotid bifurcations bilaterally without significant stenosis. 6.  Aortic Atherosclerosis (ICD10-I70.0). Electronically Signed   By: Marin Roberts M.D.   On: 05/21/2023 16:34   MR BRAIN WO CONTRAST Result Date: 05/21/2023 CLINICAL DATA:  Stroke, follow-up. Acute onset of confusion and slurred speech. EXAM: MRI HEAD WITHOUT CONTRAST TECHNIQUE: Multiplanar, multiecho pulse sequences of the brain and surrounding structures were obtained without intravenous contrast. COMPARISON:  CT head without contrast 05/21/2023. MR head without and with contrast 10/06/2021. FINDINGS: Brain: The suspected acute infarct involving the left middle frontal gyrus is confirmed on diffusion-weighted images. T2  and FLAIR hyperintensities are associated with this infarct. The infarct is posterior to the previous left superior frontal gyrus infarct. Remote encephalomalacia is present in the left parietal lobe. Periventricular and subcortical T2 hyperintensities are otherwise mildly advanced for age. The ventricles are of normal size. No significant extraaxial fluid collection is present. Remote lacunar infarcts are present in the right paramedian pons. The brainstem and cerebellum are otherwise within normal limits. Vascular: Flow is present in the major intracranial arteries. Skull and upper cervical spine: The craniocervical junction is normal. Upper cervical spine is within normal limits. Marrow signal is unremarkable. Sinuses/Orbits: Polyp or mucous retention cyst is present in the inferior left maxillary sinus. The paranasal sinuses and mastoid air cells are otherwise clear. Bilateral lens replacements are noted. Globes and orbits are otherwise unremarkable. IMPRESSION: 1. Acute/subacute infarct of the left middle frontal gyrus. 2. Remote encephalomalacia of the left superior frontal gyrus and left parietal lobe. 3. 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 4. Periventricular and subcortical T2 hyperintensities bilaterally are otherwise mildly advanced for age. This likely reflects the sequela of chronic microvascular ischemia. Electronically Signed   By: Marin Roberts M.D.   On: 05/21/2023 15:47   CT HEAD CODE STROKE WO CONTRAST Result Date: 05/21/2023 CLINICAL DATA:  Code stroke. Neuro deficit, acute, stroke suspected. Personal history of prostate cancer. EXAM: CT HEAD WITHOUT CONTRAST TECHNIQUE: Contiguous axial images were obtained from the base of the skull through the vertex without intravenous contrast. RADIATION DOSE REDUCTION: This exam was performed according to the departmental dose-optimization program which includes automated exposure control, adjustment of the mA and/or kV according to  patient size and/or use of iterative reconstruction technique. COMPARISON:  CT head without contrast and MR head without and with contrast 10/06/2021 FINDINGS: Brain: Remote infarcts of the high left frontal and parietal  lobes demonstrate expected evolution. New areas of cortical hypoattenuation are present in the left frontal operculum just posterior to the more remote infarct. No acute hemorrhage or mass lesion is present. Basal ganglia are intact. The insular cortex is normal. The brainstem and cerebellum are within normal limits. Midline structures are within normal limits. Vascular: Atherosclerotic calcifications are present within the cavernous internal carotid arteries bilaterally. No hyperdense vessel is present. Skull: Calvarium is intact. No focal lytic or blastic lesions are present. No significant extracranial soft tissue lesion is present. Sinuses/Orbits: The paranasal sinuses and mastoid air cells are clear. The globes and orbits are within normal limits. Other: ASPECTS (Alberta Stroke Program Early CT Score) - Ganglionic level infarction (caudate, lentiform nuclei, internal capsule, insula, M1-M3 cortex): 2/3 - Supraganglionic infarction (M4-M6 cortex): 7/7 Total score (0-10 with 10 being normal): 9/10 IMPRESSION: 1. New areas of cortical hypoattenuation in the left frontal operculum just posterior to the more remote infarct. This likely represents acute or subacute infarct. 2. Remote infarcts of the high left frontal and parietal lobes demonstrate expected evolution. 3. Aspects is 9/10. The above was relayed via text pager to Dr. Otelia Limes on 05/21/2023 at 12:46 . Electronically Signed   By: Marin Roberts M.D.   On: 05/21/2023 12:46      Subjective:  No significant events overnight, he denies any complaints Discharge Exam: Vitals:   05/23/23 1200 05/23/23 1217  BP: (!) 84/69 127/66  Pulse: 69 64  Resp: 15 18  Temp:    SpO2: 94% 95%   Vitals:   05/22/23 2000 05/23/23 0000 05/23/23  1200 05/23/23 1217  BP:  119/60 (!) 84/69 127/66  Pulse:  62 69 64  Resp:  18 15 18   Temp: 99 F (37.2 C) 98.8 F (37.1 C)    TempSrc: Oral Oral    SpO2:  94% 94% 95%  Weight:      Height:        General: Pt is alert, awake, not in acute distress Cardiovascular: RRR, S1/S2 +, no rubs, no gallops Respiratory: CTA bilaterally, no wheezing, no rhonchi Abdominal: Soft, NT, ND, bowel sounds + Extremities: no edema, no cyanosis    The results of significant diagnostics from this hospitalization (including imaging, microbiology, ancillary and laboratory) are listed below for reference.     Microbiology: No results found for this or any previous visit (from the past 240 hours).   Labs: BNP (last 3 results) No results for input(s): "BNP" in the last 8760 hours. Basic Metabolic Panel: Recent Labs  Lab 05/21/23 1222 05/21/23 1227 05/21/23 1705 05/21/23 1706 05/22/23 1024  NA 140 139 140 140 139  K 4.3 4.3 3.6 3.7 3.8  CL 103 105  --  105 106  CO2 19*  --   --   --  22  GLUCOSE 202* 204*  --  174* 322*  BUN 15 20  --  13 11  CREATININE 0.90 0.80  --  0.70 0.84  CALCIUM 9.4  --   --   --  9.0   Liver Function Tests: Recent Labs  Lab 05/21/23 1222  AST 24  ALT 18  ALKPHOS 64  BILITOT 1.5*  PROT 6.5  ALBUMIN 3.6   No results for input(s): "LIPASE", "AMYLASE" in the last 168 hours. No results for input(s): "AMMONIA" in the last 168 hours. CBC: Recent Labs  Lab 05/21/23 1222 05/21/23 1227 05/21/23 1705 05/21/23 1706 05/22/23 1024  WBC 9.1  --   --   --  9.9  NEUTROABS 4.0  --   --   --  7.0  HGB 15.5 16.0 14.3 14.3 14.7  HCT 46.4 47.0 42.0 42.0 41.5  MCV 102.9*  --   --   --  97.9  PLT 299  --   --   --  308   Cardiac Enzymes: No results for input(s): "CKTOTAL", "CKMB", "CKMBINDEX", "TROPONINI" in the last 168 hours. BNP: Invalid input(s): "POCBNP" CBG: Recent Labs  Lab 05/22/23 1151 05/22/23 1638 05/22/23 2105 05/23/23 0850 05/23/23 1220  GLUCAP  276* 116* 212* 166* 189*   D-Dimer No results for input(s): "DDIMER" in the last 72 hours. Hgb A1c Recent Labs    05/21/23 1700  HGBA1C 11.5*   Lipid Profile Recent Labs    05/22/23 0430  CHOL 200  HDL 34*  LDLCALC 142*  TRIG 122  CHOLHDL 5.9   Thyroid function studies No results for input(s): "TSH", "T4TOTAL", "T3FREE", "THYROIDAB" in the last 72 hours.  Invalid input(s): "FREET3" Anemia work up No results for input(s): "VITAMINB12", "FOLATE", "FERRITIN", "TIBC", "IRON", "RETICCTPCT" in the last 72 hours. Urinalysis    Component Value Date/Time   COLORURINE YELLOW 06/21/2022 1619   APPEARANCEUR CLEAR 06/21/2022 1619   LABSPEC 1.015 06/21/2022 1619   PHURINE 7.0 06/21/2022 1619   GLUCOSEU NEGATIVE 06/21/2022 1619   HGBUR NEGATIVE 06/21/2022 1619   BILIRUBINUR NEGATIVE 06/21/2022 1619   KETONESUR NEGATIVE 06/21/2022 1619   PROTEINUR NEGATIVE 10/06/2021 1354   UROBILINOGEN 0.2 06/21/2022 1619   NITRITE NEGATIVE 06/21/2022 1619   LEUKOCYTESUR NEGATIVE 06/21/2022 1619   Sepsis Labs Recent Labs  Lab 05/21/23 1222 05/22/23 1024  WBC 9.1 9.9   Microbiology No results found for this or any previous visit (from the past 240 hours).   Time coordinating discharge: Over 30 minutes  SIGNED:   Huey Bienenstock, MD  [facility] 05/23/2023, 2:31 PM Pager   If 7PM-7AM, please contact night-coverage [email or web address]

## 2023-05-23 NOTE — Evaluation (Signed)
Occupational Therapy Evaluation Patient Details Name: [name] [name] MRN: [id number] DOB: 1945-12-04 Today's Date: 05/23/2023   History of Present Illness   History of Present Illness: [name] [name] is a 78 y.o. male noted to have marked difficulty speaking, slurred speech as well as increased confusion prompting the patient to be brought to [facility], ER. An MRI has been done which shows left middle frontal gyrus acute/subacute infarction. Past medical history significant of known prostate cancer, prior strokes.     Clinical Impressions Pt doing well, wife present during session, c/o reduced clarity of speech, otherwise no complaints. Pt appears to be close to baseline, independent for ambulation around room, able to complete ADLs independently, good safety awareness. Pt does requires slightly increased time for certain activities or when speaking, but grossly WFLs. Pt has history of "limp" but overall good balance. Pt would benefit from tub bench for return home to maximize safety/independence with ADLs/transfers as stepping over the tub has been harder for Pt lately. Pt has not further acute OT or follow up needs.      If plan is discharge home, recommend the following:   Assist for transportation;Assistance with cooking/housework     Functional Status Assessment   Patient has had a recent decline in their functional status and demonstrates the ability to make significant improvements in function in a reasonable and predictable amount of time.     Equipment Recommendations   Tub/shower bench     Recommendations for Other Services         Precautions/Restrictions   Precautions Precautions: Fall Restrictions Weight Bearing Restrictions Per Provider Order: No     Mobility Bed Mobility Overal bed mobility: Modified Independent                  Transfers Overall transfer level: Independent Equipment used: None                      Balance Overall  balance assessment: No apparent balance deficits (not formally assessed)                                         ADL either performed or assessed with clinical judgement   ADL Overall ADL's : Modified independent                                             Vision Baseline Vision/History: 0 No visual deficits Ability to See in Adequate Light: 0 Adequate Patient Visual Report: No change from baseline       Perception         Praxis         Pertinent Vitals/Pain Pain Assessment Pain Assessment: No/denies pain     Extremity/Trunk Assessment Upper Extremity Assessment Upper Extremity Assessment: Overall WFL for tasks assessed           Communication Communication Communication: Impaired Factors Affecting Communication: Reduced clarity of speech   Cognition Arousal: Alert Behavior During Therapy: WFL for tasks assessed/performed Cognition: No apparent impairments             OT - Cognition Comments: grossly WFLs                 Following commands: Intact       Cueing  General Comments          Exercises     Shoulder Instructions      Home Living Family/patient expects to be discharged to:: Private residence Living Arrangements: Spouse/significant other Available Help at Discharge: Family;Available 24 hours/day Type of Home: House Home Access: Stairs to enter [employer] of Steps: 5 Entrance Stairs-Rails: Can reach both Home Layout: One level     Bathroom Shower/Tub: Chief Strategy Officer: Standard Bathroom Accessibility: Yes   Home Equipment: Agricultural consultant (2 wheels);Hand held shower head;Cane - single point   Additional Comments: Pt lives with wife who is there all day.  Lives With: Spouse    Prior Functioning/Environment Prior Level of Function : Independent/Modified Independent;Driving             Mobility Comments: Ind ADLs Comments: Ind    OT Problem  List: Decreased strength;Decreased cognition;Decreased coordination   OT Treatment/Interventions:        OT Goals(Current goals can be found in the care plan section)   Acute Rehab OT Goals Patient Stated Goal: to return home OT Goal Formulation: With patient/family Time For Goal Achievement: 06/06/23 Potential to Achieve Goals: Good   OT Frequency:       Co-evaluation              AM-PAC OT "6 Clicks" Daily Activity     Outcome Measure Help from another person eating meals?: None Help from another person taking care of personal grooming?: None Help from another person toileting, which includes using toliet, bedpan, or urinal?: None Help from another person bathing (including washing, rinsing, drying)?: None Help from another person to put on and taking off regular upper body clothing?: None Help from another person to put on and taking off regular lower body clothing?: None 6 Click Score: 24   End of Session Equipment Utilized During Treatment: Gait belt Nurse Communication: Mobility status  Activity Tolerance: Patient tolerated treatment well Patient left: in bed;with call bell/phone within reach;with family/visitor present  OT Visit Diagnosis: Muscle weakness (generalized) (M62.81);Other symptoms and signs involving cognitive function                Time: 1254-1315 OT Time Calculation (min): 21 min Charges:  OT General Charges $OT Visit: 1 Visit OT Evaluation $OT Eval Low Complexity: 1 Low  [address], OTR/L   Alexis Goodell 05/23/2023, 1:21 PM

## 2023-05-23 NOTE — Plan of Care (Signed)
  Problem: Education: Goal: Knowledge of disease or condition will improve Outcome: Progressing   Problem: Health Behavior/Discharge Planning: Goal: Goals will be collaboratively established with patient/family Outcome: Progressing   Problem: Self-Care: Goal: Ability to participate in self-care as condition permits will improve Outcome: Progressing Goal: Verbalization of feelings and concerns over difficulty with self-care will improve Outcome: Progressing

## 2023-05-23 NOTE — Evaluation (Signed)
Speech Language Pathology Evaluation Patient Details Name: [name] [name] MRN: [id number] DOB: 1945/06/21 Today's Date: 05/23/2023 Time: 4098-1191 SLP Time Calculation (min) (ACUTE ONLY): 24 min  Problem List:  Patient Active Problem List   Diagnosis Date Noted   Left femoral vein DVT (HCC) 05/22/2023   Abnormal echocardiogram 05/22/2023   DNR (do not resuscitate) 05/22/2023   Acute CVA (cerebrovascular accident) (HCC) 05/21/2023   Current chronic use of inhaled steroid 05/21/2023   Prostate cancer (HCC) 10/14/2022   Constipation 10/14/2022   Depression 10/14/2022   Elevated PSA, greater than or equal to 20 ng/ml 07/14/2022   DM (diabetes mellitus), type 2 (HCC) 04/27/2022   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   Neuropathic pain of hand, right 10/21/2021   Syncope 10/07/2021   Type 2 diabetes mellitus with complication, without long-term current use of insulin (HCC) 10/07/2021   Stroke (HCC) 10/07/2021   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 (HCC)    Acute ischemic stroke (HCC) 10/06/2021   CAD (coronary artery disease) 10/06/2021   PVD (peripheral vascular disease) (HCC) 04/15/2019   Dyslipidemia, goal LDL below 70 04/15/2019   History of CVA with residual deficit 04/15/2019   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   Past Medical History:  Past Medical History:  Diagnosis Date   CAD (coronary artery disease)    Constipation    Diabetes mellitus without complication (HCC)    borderline/ pre diabetic   NSTEMI (non-ST elevated myocardial infarction) (HCC)    at [place]   Prostate cancer (HCC)    Stroke (HCC)    L side - 40 years ago   Past Surgical History:  Past Surgical History:  Procedure Laterality Date   CARDIAC CATHETERIZATION     CATARACT EXTRACTION Bilateral    05/2021; 07/2021   CORONARY ARTERY BYPASS GRAFT     at [facility]   HPI:  [name] [name] is a 78 yo male presenting to ED 2/10 with confusion and dysarthria. MRI Brain shows acute infarct in the L  middle frontal gyrus as well as remote pontine infarcts. Seen by SLP 10/07/21 and scored 16/30 on the SLUMS. PMH include metastatic adenocarcinoma with bony mets, prior L MCA CVA, CAD, DM   Assessment / Plan / Recommendation Clinical Impression  Pt and his wife report feeling as if pt is experiencing acute cognitive linguistic deficits. He previously worked as a Programmer, systems at [employer] so his language is important to him. Today, pt scored 13/30 on the SLUMS (a score of 27 or above is considered WFL) characterized by deficits related to memory, attention, and problem solving. His score is slightly below his previous score of 16/30 on 10/07/21. He had significant difficulty storing and recalling five novel words, which he reports is not significantly different than baseline. Pt has word finding deficits at the conversation level, but performed functionally on all structured language tasks. His verbal output is characterized by anomia and attempts at circumlocution. Pt demonstrated good awareness of prior deficits and acute exacerbations. Recommend ongoing SLP f/u on an OP basis.    SLP Assessment  SLP Recommendation/Assessment: All further Speech Lanaguage Pathology  needs can be addressed in the next venue of care SLP Visit Diagnosis: Cognitive communication deficit (R41.841);Aphasia (R47.01)    Recommendations for follow up therapy are one component of a multi-disciplinary discharge planning process, led by the attending physician.  Recommendations may be updated based on patient status, additional functional criteria and insurance authorization.    Follow Up Recommendations  Outpatient  SLP    Assistance Recommended at Discharge  Frequent or constant Supervision/Assistance  Functional Status Assessment Patient has had a recent decline in their functional status and demonstrates the ability to make significant improvements in function in a reasonable and predictable amount of time.   Frequency and Duration           SLP Evaluation Cognition  Overall Cognitive Status: Within Functional Limits for tasks assessed Arousal/Alertness: Awake/alert Orientation Level: Oriented X4 Attention: Sustained;Selective Sustained Attention: Appears intact Selective Attention: Impaired Selective Attention Impairment: Verbal basic Memory: Impaired Memory Impairment: Storage deficit;Retrieval deficit Awareness: Impaired Awareness Impairment: Emergent impairment Problem Solving: Impaired Problem Solving Impairment: Verbal basic       Comprehension  Auditory Comprehension Overall Auditory Comprehension: Appears within functional limits for tasks assessed    Expression Expression Primary Mode of Expression: Verbal Verbal Expression Overall Verbal Expression: Impaired Repetition: Impaired Level of Impairment: Sentence level Naming: No impairment Pragmatics: No impairment   Oral / Motor  Oral Motor/Sensory Function Overall Oral Motor/Sensory Function: Within functional limits Motor Speech Overall Motor Speech: Appears within functional limits for tasks assessed            Gwynneth Aliment, M.A., CF-SLP Speech Language Pathology, Acute Rehabilitation Services  Secure Chat preferred [phone]  05/23/2023, 12:05 PM

## 2023-05-24 ENCOUNTER — Telehealth: Payer: Self-pay | Admitting: *Deleted

## 2023-05-24 NOTE — Telephone Encounter (Signed)
TCT patient's wife. Spoke with her. Advised that Dr. Leonides Schanz did go to [facility] yesterday to see her husband but by the time he got there-pt had been discharged. Asked her if she wanted to move up her husband's appt to be sooner than 06/21/23. She said they will be fine with current appt. She wanted to make sure that Dr. Leonides Schanz was ok with her husband being on Eliquis s/p his recent CVA. Advised that Dr. Leonides Schanz is aware of this and it is ok with him. Pt is to take the decreased dosage of Zytiga and steroids as discussed at an earlier appt. Wife voiced understanding.

## 2023-05-26 ENCOUNTER — Other Ambulatory Visit: Payer: Self-pay

## 2023-05-26 ENCOUNTER — Ambulatory Visit: Payer: Medicare PPO | Attending: Internal Medicine | Admitting: Physical Therapy

## 2023-05-26 ENCOUNTER — Ambulatory Visit: Payer: Medicare PPO | Admitting: Occupational Therapy

## 2023-05-26 ENCOUNTER — Encounter: Payer: Self-pay | Admitting: Family Medicine

## 2023-05-26 ENCOUNTER — Encounter: Payer: Self-pay | Admitting: Physical Therapy

## 2023-05-26 ENCOUNTER — Encounter: Payer: Self-pay | Admitting: Cardiology

## 2023-05-26 VITALS — BP 124/73 | HR 69

## 2023-05-26 DIAGNOSIS — R29818 Other symptoms and signs involving the nervous system: Secondary | ICD-10-CM | POA: Insufficient documentation

## 2023-05-26 DIAGNOSIS — R278 Other lack of coordination: Secondary | ICD-10-CM | POA: Insufficient documentation

## 2023-05-26 DIAGNOSIS — M6281 Muscle weakness (generalized): Secondary | ICD-10-CM

## 2023-05-26 DIAGNOSIS — R41841 Cognitive communication deficit: Secondary | ICD-10-CM | POA: Diagnosis not present

## 2023-05-26 DIAGNOSIS — R2681 Unsteadiness on feet: Secondary | ICD-10-CM | POA: Diagnosis not present

## 2023-05-26 DIAGNOSIS — R4701 Aphasia: Secondary | ICD-10-CM | POA: Diagnosis not present

## 2023-05-26 DIAGNOSIS — R2689 Other abnormalities of gait and mobility: Secondary | ICD-10-CM

## 2023-05-26 NOTE — Therapy (Signed)
OUTPATIENT PHYSICAL THERAPY NEURO EVALUATION   Patient Name: [name] [name] MRN: [id number] DOB:December 02, 1945, 78 y.o., male Today's Date: 05/26/2023   PCP: Nelwyn Salisbury, MD REFERRING PROVIDER: Starleen Arms, MD  END OF SESSION:  PT End of Session - 05/26/23 1233     Visit Number 1    Number of Visits 5    Date for PT Re-Evaluation 06/16/23    Authorization Type [employer]    PT Start Time 1230    PT Stop Time 1315    PT Time Calculation (min) 45 min    Activity Tolerance Patient tolerated treatment well    Behavior During Therapy WFL for tasks assessed/performed             Past Medical History:  Diagnosis Date   CAD (coronary artery disease)    Constipation    Diabetes mellitus without complication (HCC)    borderline/ pre diabetic   NSTEMI (non-ST elevated myocardial infarction) (HCC)    at [place]   Prostate cancer (HCC)    Stroke (HCC)    L side - 40 years ago   Past Surgical History:  Procedure Laterality Date   CARDIAC CATHETERIZATION     CATARACT EXTRACTION Bilateral    05/2021; 07/2021   CORONARY ARTERY BYPASS GRAFT     at [facility]   Patient Active Problem List   Diagnosis Date Noted   Left femoral vein DVT (HCC) 05/22/2023   Abnormal echocardiogram 05/22/2023   DNR (do not resuscitate) 05/22/2023   Acute CVA (cerebrovascular accident) (HCC) 05/21/2023   Current chronic use of inhaled steroid 05/21/2023   Prostate cancer (HCC) 10/14/2022   Constipation 10/14/2022   Depression 10/14/2022   Elevated PSA, greater than or equal to 20 ng/ml 07/14/2022   DM (diabetes mellitus), type 2 (HCC) 04/27/2022   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   Neuropathic pain of hand, right 10/21/2021   Syncope 10/07/2021   Type 2 diabetes mellitus with complication, without long-term current use of insulin (HCC) 10/07/2021   Stroke (HCC) 10/07/2021   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 (HCC)    Acute ischemic stroke (HCC) 10/06/2021    CAD (coronary artery disease) 10/06/2021   PVD (peripheral vascular disease) (HCC) 04/15/2019   Dyslipidemia, goal LDL below 70 04/15/2019   History of CVA with residual deficit 04/15/2019   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 Pt not using AD and states he feels as though his balance is about the same, but is having difficulty w/speech. Reports he was told he has a heart condition in the hospital that may be causing his strokes, cannot remember what this was. Reports he has burning in BLEs that keeps him up at night, but otherwise no pain. No falls.    Pt accompanied by: significant other, [name]   PERTINENT HISTORY: CAD with CABG in 2017, history of CVA in the 1980s, peripheral arterial disease, prediabetes, prostate cancer    PAIN:  Are you having pain? No  PRECAUTIONS: Fall  RED FLAGS: None   WEIGHT BEARING RESTRICTIONS: No  FALLS: Has patient fallen in last 6 months? No  LIVING ENVIRONMENT: Lives with: lives with their spouse Lives in: House/apartment Stairs: Yes: External: 5 steps; on right going up, on left going up, and can reach both Has following equipment at home: None  PLOF: Independent  PATIENT GOALS: "Just speech"   OBJECTIVE:  Note: Objective measures were completed at Evaluation unless otherwise noted.  DIAGNOSTIC FINDINGS: MRI of brain on 05/21/23   IMPRESSION: 1. Acute/subacute infarct of the left middle frontal gyrus. 2. Remote  encephalomalacia of the left superior frontal gyrus and left parietal lobe. 3. 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 4. Periventricular and subcortical T2 hyperintensities bilaterally are otherwise mildly advanced for age. This likely reflects the sequela of chronic microvascular ischemia.  CT Angio of head and neck from 05/21/23    IMPRESSION: 1. 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 2. Moderate stenoses at the dural margin of both vertebral arteries and decreased size of the more distal basilar artery, likely reflecting decreased perfusion. 3. 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 4. Moderate stenosis in the distal left P2 segment just proximal to the bifurcation. 5. Atherosclerotic changes at the carotid bifurcations bilaterally without significant stenosis. 6.  Aortic Atherosclerosis (ICD10-I70.0).  CT of head from 05/21/23   IMPRESSION: 1. New areas of cortical hypoattenuation in the left frontal operculum just posterior to the more remote infarct. This likely represents acute or subacute infarct. 2. Remote infarcts of the high left frontal and parietal lobes demonstrate expected evolution. 3. Aspects is 9/10.   COGNITION: Overall cognitive status: Impaired and Expressive aphasia    SENSATION: Pt denies numbness/tingling in BLEs and BUEs, but reports burning in legs at night that cause him to stay awake.     POSTURE: rounded shoulders, forward head, and increased thoracic kyphosis  LOWER EXTREMITY ROM:   WNL   Active  Right Eval Left Eval  Hip flexion    Hip extension    Hip abduction    Hip adduction    Hip internal rotation    Hip external rotation    Knee flexion    Knee extension    Ankle dorsiflexion    Ankle plantarflexion    Ankle inversion    Ankle eversion     (Blank rows = not tested)  LOWER EXTREMITY MMT:  Tested in seated position   MMT Right Eval  Left Eval  Hip flexion 4- 4-  Hip extension    Hip abduction 4 4-  Hip adduction 4+ 4  Hip internal rotation    Hip external rotation    Knee flexion 4+ 4  Knee extension 4+ 4  Ankle dorsiflexion 4 4-  Ankle plantarflexion    Ankle inversion  Ankle eversion    (Blank rows = not tested)  BED MOBILITY:  Independent per pt   TRANSFERS: Assistive device utilized: None  Sit to stand: Modified independence Stand to sit: Modified independence Chair to chair: Modified independence   GAIT: Gait pattern: step through pattern, decreased arm swing- Right, decreased arm swing- Left, decreased step length- Left, decreased stance time- Right, decreased stride length, decreased hip/knee flexion- Left, decreased ankle dorsiflexion- Right, decreased ankle dorsiflexion- Left, lateral hip instability, and wide BOS Distance walked: Various clinic distances  Assistive device utilized: None Level of assistance: Modified independence Comments: Pt ambulates w/decreased eccentric control on L side, but this is baseline for pt. No new instability noted since previous POC   FUNCTIONAL TESTS:   [facility] PT Assessment - 05/26/23 1301       Transfers   Five time sit to stand comments  14s   no UE support     Ambulation/Gait   Gait velocity 32.8' over 11.16s = 2.93 ft/s   no AD             VITALS  Vitals:   05/26/23 1308  BP: 124/73  Pulse: 69                                                                                                                                 TREATMENT: Self-care/home management  Reviewed cardiology notes from recent hospital stay and educated pt and wife on EF meaning and importance of monitoring vitals at home. Pt and wife verbalized understanding    PATIENT EDUCATION: Education details: POC, eval findings  Person educated: Patient and Spouse Education method: Explanation Education comprehension: verbalized understanding  HOME EXERCISE PROGRAM: To be  reviewed from previous POC: [id number]  GOALS: Goals reviewed with patient? Yes  STG = LTG due to POC length    LONG TERM GOALS: Target date: 06/09/2023   FGA to be assessed and goal updated  Baseline:  Goal status: INITIAL  2.  Pt will improve gait velocity to at least 3.1 ft/s no AD for improved gait efficiency and independence   Baseline:  Goal status: INITIAL  ASSESSMENT:  CLINICAL IMPRESSION: Patient is a 78 year old male referred to Neuro OPPT for CVA.   Pt's PMH is significant for: CAD with CABG in 2017, history of CVA in the 1980s, peripheral arterial disease, prediabetes and prostate cancer. The following deficits were present during the exam: expressive aphasia, chronic L hemiplegia and impaired balance. Based on history of strokes and L hemiplegia, pt is an incr risk for falls. Pt would benefit from skilled PT to address these impairments and functional limitations to maximize functional mobility independence.    OBJECTIVE IMPAIRMENTS: Abnormal gait, decreased activity tolerance, decreased balance, decreased endurance, decreased mobility, difficulty walking, decreased strength, and improper body mechanics   ACTIVITY LIMITATIONS: carrying, lifting, squatting, locomotion level, and caring for others  PARTICIPATION LIMITATIONS: medication management, driving, and yard work  PERSONAL FACTORS: Age, Past/current experiences, and 1 comorbidity: Chronic CVA  are also affecting patient's functional outcome.   REHAB POTENTIAL: Good  CLINICAL DECISION MAKING: Evolving/moderate complexity  EVALUATION COMPLEXITY: Moderate  PLAN:  PT FREQUENCY: 2x/week  PT DURATION: 2 weeks  PLANNED INTERVENTIONS: 97164- PT Re-evaluation, 97110-Therapeutic exercises, 97530- Therapeutic activity, 97112- Neuromuscular re-education, 97535- Self Care, 16109- Manual therapy, [phone]- Gait training, [phone]- Orthotic Fit/training, [phone]- Aquatic Therapy, [phone]- Electrical stimulation (manual),  Patient/Family education, Balance training, Stair training, Dry Needling, Joint mobilization, Vestibular training, and DME instructions  PLAN FOR NEXT SESSION: FGA and update goal. Work on head turns, high level balance tasks. Review HEP and add to it prn    Jill Alexanders Ambermarie Honeyman, PT, DPT 05/26/2023, 1:19 PM

## 2023-05-26 NOTE — Therapy (Signed)
OUTPATIENT OCCUPATIONAL THERAPY NEURO EVALUATION  Patient Name: [name] [name] MRN: [id number] DOB:[birth year], 78 y.o., male Today's Date: 05/26/2023  PCP: Nelwyn Salisbury, MD  REFERRING PROVIDER: Elgergawy, Leana Roe, MD   END OF SESSION:  OT End of Session - 05/26/23 1408     Visit Number 1    Number of Visits 5   including eval   Date for OT Re-Evaluation 06/09/23    Authorization Type [employer] 2025, VL: [place], Auth Required    OT Start Time 1315    OT Stop Time 1358    OT Time Calculation (min) 43 min    Activity Tolerance Patient tolerated treatment well    Behavior During Therapy WFL for tasks assessed/performed             Past Medical History:  Diagnosis Date   CAD (coronary artery disease)    Constipation    Diabetes mellitus without complication (HCC)    borderline/ pre diabetic   NSTEMI (non-ST elevated myocardial infarction) (HCC)    at [place]   Prostate cancer (HCC)    Stroke (HCC)    L side - 40 years ago   Past Surgical History:  Procedure Laterality Date   CARDIAC CATHETERIZATION     CATARACT EXTRACTION Bilateral    05/2021; 07/2021   CORONARY ARTERY BYPASS GRAFT     at [facility]   Patient Active Problem List   Diagnosis Date Noted   Left femoral vein DVT (HCC) 05/22/2023   Abnormal echocardiogram 05/22/2023   DNR (do not resuscitate) 05/22/2023   Acute CVA (cerebrovascular accident) (HCC) 05/21/2023   Current chronic use of inhaled steroid 05/21/2023   Prostate cancer (HCC) 10/14/2022   Constipation 10/14/2022   Depression 10/14/2022   Elevated PSA, greater than or equal to 20 ng/ml 07/14/2022   DM (diabetes mellitus), type 2 (HCC) 04/27/2022   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   Neuropathic pain of hand, right 10/21/2021   Syncope 10/07/2021   Type 2 diabetes mellitus with complication, without long-term current use of insulin (HCC) 10/07/2021   Stroke (HCC) 10/07/2021   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 (HCC)     Acute ischemic stroke (HCC) 10/06/2021   CAD (coronary artery disease) 10/06/2021   PVD (peripheral vascular disease) (HCC) 04/15/2019   Dyslipidemia, goal LDL below 70 04/15/2019   History of CVA with residual deficit 04/15/2019   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    ONSET DATE: 05/24/23 (referral date)  REFERRING DIAG: I63.9 (ICD-10-CM) - CVA (cerebrovascular accident) (HCC)   THERAPY DIAG:  Other lack of coordination  Other symptoms and signs involving the nervous system  Rationale for Evaluation and Treatment: Rehabilitation  SUBJECTIVE:   SUBJECTIVE STATEMENT: Pt reported difficulty with speech, stating "my brain knows what to do but there's a delay." Pt reported BUE are "fine."  Pt accompanied by: self and significant other (wife: [name])  PERTINENT HISTORY: Per 05/23/23 MD D/C Summary: "metastatic prostate CA, prior CVA, CAD, and DM who presented on 2/9 with word finding difficulties and dizziness, found to have acute/subacute infarct in the the L middle frontal gyrus. Neurology consulted. He was taking ASA and Plavix on alternating days PTA. DVT US with LLE DVT, started on Eliquis."   Per 05/23/23 MD D/C Summary: "CTA head and neck with high-grade stenosis in proximal V1 segment,moderate stenoses at dural margin of both vertebral arteries, moderate stenoses of cavernous ICAs bilaterally, moderate stenosis in distal left P2 segment  -MRI acute/subacute infarct of the left middle  frontal gyrus, remote encephalomalacia of left superior frontal gyrus and left parietal lobe, remote lacunar infarct of right paramedian pons, chronic microvascular ischemic disease"  PRECAUTIONS: Fall, no driving  WEIGHT BEARING RESTRICTIONS: No  PAIN:  Are you having pain? No  FALLS: Has patient fallen in last 6 months? No  LIVING ENVIRONMENT: Lives with: lives with their spouse Lives in: House/apartment Stairs: Yes: External: 5 steps; can reach both Has following equipment at home: Single point  cane  PLOF: Independent   PATIENT GOALS: Pt unable to identify goal. Per pt's spouse "another tune-up."  OBJECTIVE:  Note: Objective measures were completed at Evaluation unless otherwise noted.  HAND DOMINANCE: Right  ADLs: Overall ADLs: ind Transfers/ambulation related to ADLs: Eating: ind Grooming: ind, assistance for shaving d/t safety (per pt: pt taking blood thinner medication and wants to avoid accidental cuts/scrapes) UB Dressing: ind, sometimes assistance for jackets d/t difficulty with shoulder IR to reach other sleeve, some difficulty with zippers for "cheap zippers, no problems with high quality zippers." Pt ind demo'd good ability to engage/manipulate zipper today during eval. LB Dressing: ind Toileting: ind Bathing: ind Tub Shower transfers: ind "carefully," reported preference for tub transfer bench though insurance does not cover per pt report Equipment: Long handled sponge and tub/shower  IADLs: Shopping: dependent, pt assists with creating shopping list Light housekeeping: ind laundry, spouse completes dishwashing Meal Prep: ind, some assistance for cutting food d/t safety (pt taking blood thinner medication and wants to avoid accidental cuts/scrapes), ind with stovetop, oven, and microwave meals after food is cut up Community mobility: currently not driving Medication management: spouse assists  Handwriting:  Pt reported "terrible" handwriting. Pt requested to work on handwriting a little bit.  MOBILITY STATUS: Independent  POSTURE COMMENTS:  Ind sitting balance  ACTIVITY TOLERANCE: Activity tolerance: Pt paces self and tolerates tasks well.  FUNCTIONAL OUTCOME MEASURES: Quick Dash: 0% deficit    UPPER EXTREMITY ROM:    Active ROM Right eval Left eval  Shoulder flexion [facility]  Shoulder abduction    Shoulder adduction    Shoulder extension    Shoulder internal rotation    Shoulder external rotation    Elbow flexion    Elbow extension     Wrist flexion    Wrist extension    Wrist ulnar deviation    Wrist radial deviation    Wrist pronation    Wrist supination    (Blank rows = not tested)  UPPER EXTREMITY MMT:     MMT Right eval Left eval  Shoulder flexion 5 4+  Shoulder abduction    Shoulder adduction    Shoulder extension    Shoulder internal rotation    Shoulder external rotation    Middle trapezius    Lower trapezius    Elbow flexion    Elbow extension    Wrist flexion    Wrist extension    Wrist ulnar deviation    Wrist radial deviation    Wrist pronation    Wrist supination    (Blank rows = not tested)  HAND FUNCTION: Grip strength: Right: 59, 55, 60 (58 lbs average) lbs; Left: 55, 46, 52 (51 lbs average) lbs  Pt reported no concerns related to strength of BUE for functional tasks.  COORDINATION: 9 Hole Peg test: Right: 31 sec; Left: 43 sec  SENSATION: Pt reported no numbness/tingling of BUE.  EDEMA: none noted  MUSCLE TONE: RUE: Within functional limits and LUE: Within functional limits  COGNITION: Overall cognitive status:  see  below  Pt required increased processing time and demo'd delayed response time. Pt responded to questions and conversation appropriately and demo'd good insights into deficits and abilities.  VISION: Subjective report: Pt reported no changes to vision. Pt accurately read clock on wall. Pt reported no difficulty with navigating environment.  Baseline vision: No visual deficits Visual history: cataracts and cataract surgery  PERCEPTION: Not tested  PRAXIS: Not tested  OBSERVATIONS: Pt ambulated ind without A/E. Pt demo'd good insights into safety precautions for daily tasks.                                                                                                                             TREATMENT DATE:    Eval only  PATIENT EDUCATION: Education details: OT role, POC Person educated: Patient and Spouse Education method: Explanation Education  comprehension: verbalized understanding  HOME EXERCISE PROGRAM: TBD   GOALS: Goals reviewed with patient? Yes  SHORT TERM GOALS: Target date: 06/09/23  Pt will be ind with LUE HEP. Baseline: new to outpt OT Goal status: INITIAL  2.  Pt will be ind with handwriting HEP. Baseline: new to outpt OT Goal status: INITIAL  ASSESSMENT:  CLINICAL IMPRESSION: Patient is a 78 y.o. male who was seen today for occupational therapy evaluation for CVA. Hx includes metastatic prostate CA, prior CVA, CAD, and DM. Patient currently presents near baseline level of functioning with some deficits in FM coordination. Pt would benefit from skilled OT services in the outpatient setting to work on impairments as noted below to help pt return to PLOF as able.     PERFORMANCE DEFICITS: in functional skills including IADLs, coordination, dexterity, Fine motor control, Gross motor control, mobility, balance, and UE functional use, cognitive skills including memory and thought, expressive language, processing time, and psychosocial skills including environmental adaptation.   IMPAIRMENTS: are limiting patient from IADLs.   CO-MORBIDITIES: may have co-morbidities  that affects occupational performance. Patient will benefit from skilled OT to address above impairments and improve overall function.  MODIFICATION OR ASSISTANCE TO COMPLETE EVALUATION: No modification of tasks or assist necessary to complete an evaluation.  OT OCCUPATIONAL PROFILE AND HISTORY: Problem focused assessment: Including review of records relating to presenting problem.  CLINICAL DECISION MAKING: LOW - limited treatment options, no task modification necessary  REHAB POTENTIAL: Good  EVALUATION COMPLEXITY: Low    PLAN:  OT FREQUENCY: 2x/week  OT DURATION: 2 weeks  PLANNED INTERVENTIONS: 97168 OT Re-evaluation, 97535 self care/ADL training, 19147 therapeutic exercise, 97530 therapeutic activity, 97112 neuromuscular re-education,  97140 manual therapy, functional mobility training, visual/perceptual remediation/compensation, energy conservation, coping strategies training, and DME and/or AE instructions  RECOMMENDED OTHER SERVICES: PT eval already completed, recommended to schedule SLP eval (SLP referral available)  CONSULTED AND AGREED WITH PLAN OF CARE: Patient  PLAN FOR NEXT SESSION:  FM coordination HEP Shoulder IR HEP to improve ability to don jacket Handwriting HEP FM coordination activities  Wynetta Emery, OT 05/26/2023, 2:10  PM

## 2023-05-29 ENCOUNTER — Ambulatory Visit: Payer: Medicare PPO | Admitting: Physical Therapy

## 2023-05-29 ENCOUNTER — Ambulatory Visit: Payer: Medicare PPO | Admitting: Occupational Therapy

## 2023-05-31 ENCOUNTER — Ambulatory Visit: Payer: Medicare PPO | Admitting: Physical Therapy

## 2023-05-31 ENCOUNTER — Ambulatory Visit: Payer: Medicare PPO | Admitting: Speech Pathology

## 2023-05-31 ENCOUNTER — Encounter: Payer: Medicare PPO | Admitting: Occupational Therapy

## 2023-05-31 ENCOUNTER — Encounter: Payer: Self-pay | Admitting: Speech Pathology

## 2023-05-31 DIAGNOSIS — R4701 Aphasia: Secondary | ICD-10-CM

## 2023-05-31 DIAGNOSIS — R2689 Other abnormalities of gait and mobility: Secondary | ICD-10-CM | POA: Diagnosis not present

## 2023-05-31 DIAGNOSIS — M6281 Muscle weakness (generalized): Secondary | ICD-10-CM | POA: Diagnosis not present

## 2023-05-31 DIAGNOSIS — R41841 Cognitive communication deficit: Secondary | ICD-10-CM

## 2023-05-31 DIAGNOSIS — R29818 Other symptoms and signs involving the nervous system: Secondary | ICD-10-CM | POA: Diagnosis not present

## 2023-05-31 DIAGNOSIS — R2681 Unsteadiness on feet: Secondary | ICD-10-CM | POA: Diagnosis not present

## 2023-05-31 DIAGNOSIS — R278 Other lack of coordination: Secondary | ICD-10-CM | POA: Diagnosis not present

## 2023-05-31 NOTE — Therapy (Signed)
OUTPATIENT SPEECH LANGUAGE PATHOLOGY EVALUATION   Patient Name: [name] [name] MRN: [id number] DOB:March 02, 1946, 78 y.o., male Today's Date: 05/31/2023  PCP: Nelwyn Salisbury, MD REFERRING PROVIDER: Starleen Arms, MD  END OF SESSION:  End of Session - 05/31/23 1401     Visit Number 1    Number of Visits 25    Date for SLP Re-Evaluation 08/23/23    Authorization Type [employer]    SLP Start Time 0930    SLP Stop Time  1025    SLP Time Calculation (min) 55 min    Activity Tolerance Patient tolerated treatment well             Past Medical History:  Diagnosis Date   CAD (coronary artery disease)    Constipation    Diabetes mellitus without complication (HCC)    borderline/ pre diabetic   NSTEMI (non-ST elevated myocardial infarction) (HCC)    at [place]   Prostate cancer (HCC)    Stroke (HCC)    L side - 40 years ago   Past Surgical History:  Procedure Laterality Date   CARDIAC CATHETERIZATION     CATARACT EXTRACTION Bilateral    05/2021; 07/2021   CORONARY ARTERY BYPASS GRAFT     at [facility]   Patient Active Problem List   Diagnosis Date Noted   Left femoral vein DVT (HCC) 05/22/2023   Abnormal echocardiogram 05/22/2023   DNR (do not resuscitate) 05/22/2023   Acute CVA (cerebrovascular accident) (HCC) 05/21/2023   Current chronic use of inhaled steroid 05/21/2023   Prostate cancer (HCC) 10/14/2022   Constipation 10/14/2022   Depression 10/14/2022   Elevated PSA, greater than or equal to 20 ng/ml 07/14/2022   DM (diabetes mellitus), type 2 (HCC) 04/27/2022   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   Neuropathic pain of hand, right 10/21/2021   Syncope 10/07/2021   Type 2 diabetes mellitus with complication, without long-term current use of insulin (HCC) 10/07/2021   Stroke (HCC) 10/07/2021   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 (HCC)    Acute ischemic stroke (HCC) 10/06/2021   CAD (coronary artery disease) 10/06/2021   PVD (peripheral  vascular disease) (HCC) 04/15/2019   Dyslipidemia, goal LDL below 70 04/15/2019   History of CVA with residual deficit 04/15/2019   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 MRI Brain shows acute infarct in the L middle frontal gyrus as well as remote pontine infarcts.   PAIN:  Are you having pain? No  FALLS: Has patient fallen in last 6 months?  See PT evaluation for details  LIVING ENVIRONMENT: Lives with: lives with their spouse Lives in: House/apartment  PLOF:  Level of assistance: Independent with ADLs, Needed assistance with IADLS Employment: Retired  PATIENT GOALS: "To get my thoughts together and express them"  OBJECTIVE:  Note: Objective measures were completed at Evaluation unless otherwise noted.  DIAGNOSTIC FINDINGS: IMPRESSION: 1. Acute/subacute infarct of the left middle frontal gyrus. 2. Remote encephalomalacia of the left superior frontal gyrus and left parietal lobe. 3. 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 4. Periventricular and subcortical T2 hyperintensities bilaterally are otherwise mildly advanced  for age. This likely reflects the sequela of chronic microvascular ischemia.  COGNITION: Overall cognitive status: Impaired Areas of impairment:  Attention: Impaired: Alternating, Divided Memory: Impaired: Working Theme park manager function: Impaired: Slow processing Functional deficits:   COGNITIVE COMMUNICATION: Following directions: Follows one step commands consistently  Auditory comprehension: Impaired: 50-75% yes/no Verbal  expression: Impaired: moderate aphasia with telegraphic and vague empty language. See Clinical Impressions Functional communication: Impaired: Difficulty relaying cohesive narrative on familiar and expert topic  ORAL MOTOR EXAMINATION: Overall status: WFL Comments:   STANDARDIZED ASSESSMENTS: QAB: Moderate 6.07  PATIENT REPORTED OUTCOME MEASURES (PROM): Cognitive Function: 107/140 - [name] [name] rated 3, 4, 5 on most items. The items which he rated lower, included word being on the tip of his tongue, difficulty forming thoughts and slow thinking. He did rate a 3 or sometimes having difficulty managing appointment, having to read something more than once to understand and keep up with appointments. His spouse agreed that expressive language was his primary concern                                                                                                                            TREATMENT DATE:   05/31/23 (eval): Evaluation completed, reviewed goals, aphasia homework provided    PATIENT EDUCATION: Education details: See Patient Instructions, See Today's Treatment, Compensations for aphasia and congition Person educated: Patient and Spouse Education method: Explanation, Demonstration, Verbal cues, and Handouts Education comprehension: verbalized understanding and needs further education   GOALS: Goals reviewed with patient? Yes  SHORT TERM GOALS: Target date: 06/28/23  Pt will name 15 personally relevant items in a category with rare min A Baseline: Goal status: INITIAL  2.  Pt will write biographical information self correcting errors with occasional min A Baseline:  Goal status: INITIAL  3.  Pt & caregiver will carryover 2 compensatory strategies to support auditory comprehension in conversation over 1 week Baseline:  Goal status: INITIAL  4.  Pt will generate grammatically complex mildly complex sentences in structured language task such as VNeST with occasional min  A Baseline:  Goal status: INITIAL  5.  Pt will use verbal compensations for aphasia in structured naming task 4/5 opportunities with occasional min A Baseline:  Goal status: INITIAL  6.  Pt will write names of tools and car parts, self correcting as needed, to facilitate ordering of car parts on the internet Baseline:  Goal status: INITIAL  LONG TERM GOALS: Target date: 08/23/23  Pt will self correct agrammitism in conversation 4/5 opportunities with usual min A Baseline:  Goal status: INITIAL  2.  Pt will write or respond to emails at simple sentence level self correcting aphasic errors with occasional min A Baseline:  Goal status: INITIAL  3.  Pt will use external aid/calendar to manage his appointments and schedule/to do list with occasional min A from spouse Baseline:  Goal status: INITIAL  4.  Pt will use verbal compensations for aphasia in simple  conversation with occasional min A Baseline:  Goal status: INITIAL  5.  Pt will explain a personally relevant topic with cohesion to require 2 or less requests for clarification  Baseline:  Goal status: INITIAL  ASSESSMENT:  CLINICAL IMPRESSION: Patient is a 78 y.o. male who was seen today for mild cognitive impairments and moderate aphasia. Both he and his spouse [name] [name] state that [name] [name] and attention are at baseline and they are not concerned about these. They both express concern re: [name] [name]'s difficulty communicating including word finding and expressing his thoughts accurately. [name] [name] reports that he is slow in getting out what he wants to say. 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 [name] [name] stated "he missed some words that would explain it better." [name] [name] has always managed the finances and she has been managing [name] [name]'s medications since 2017. Auditory comprehension impaired at moderately complex yes/no, simple and moderately complex 1-2 step instructions. He frequently asked for  repetition on yes/no questions and conversational questions. Oral reading was telegraphic like his narrative. For example, he read "Dog Sleep Bed" for The dog sleeps in the bed." In sentence generation to a picture, he stated "Girl chasing boy" instead of The girl is chasing he boy. Conversational speech is empty and vague requiring me to use frequent questioning cues for clarification. Confrontation naming 5/6; divergent naming - 10 animals and 2 "m" words in 1 minute. Written expression - aphasic errors on address and short sentences (knw/know; flen/fled; don/down; [id number] (his address). He is asking his wife how to spell words in order to look up or order car parts. 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    OBJECTIVE IMPAIRMENTS: include attention, memory, executive functioning, and aphasia. These impairments are limiting patient from managing medications, managing appointments, and effectively communicating at home and in community. Factors affecting potential to achieve goals and functional outcome are previous level of function.. Patient will benefit from skilled SLP services to address above impairments and improve overall function.  REHAB POTENTIAL: Good  PLAN:  SLP FREQUENCY: 2x/week  SLP DURATION: 12 weeks  PLANNED INTERVENTIONS: Language facilitation, Environmental controls, Cueing hierachy, Cognitive reorganization, and Internal/external aids    Verlie Liotta, Radene Journey, CCC-SLP 05/31/2023, 2:41 PM

## 2023-06-01 ENCOUNTER — Ambulatory Visit: Payer: Medicare PPO | Admitting: Hematology and Oncology

## 2023-06-01 ENCOUNTER — Ambulatory Visit: Payer: Medicare PPO | Attending: Cardiology | Admitting: Cardiology

## 2023-06-01 ENCOUNTER — Encounter: Payer: Self-pay | Admitting: Family Medicine

## 2023-06-01 ENCOUNTER — Ambulatory Visit: Payer: Medicare PPO | Admitting: Family Medicine

## 2023-06-01 ENCOUNTER — Other Ambulatory Visit: Payer: Medicare PPO

## 2023-06-01 ENCOUNTER — Other Ambulatory Visit: Payer: Self-pay | Admitting: Cardiology

## 2023-06-01 ENCOUNTER — Encounter: Payer: Self-pay | Admitting: Cardiology

## 2023-06-01 ENCOUNTER — Encounter: Payer: Self-pay | Admitting: *Deleted

## 2023-06-01 VITALS — BP 102/64 | HR 73 | Ht 65.0 in | Wt 167.7 lb

## 2023-06-01 VITALS — BP 104/64 | HR 67 | Temp 97.7°F | Wt 167.7 lb

## 2023-06-01 DIAGNOSIS — I2510 Atherosclerotic heart disease of native coronary artery without angina pectoris: Secondary | ICD-10-CM

## 2023-06-01 DIAGNOSIS — E782 Mixed hyperlipidemia: Secondary | ICD-10-CM

## 2023-06-01 DIAGNOSIS — I82412 Acute embolism and thrombosis of left femoral vein: Secondary | ICD-10-CM

## 2023-06-01 DIAGNOSIS — E1140 Type 2 diabetes mellitus with diabetic neuropathy, unspecified: Secondary | ICD-10-CM

## 2023-06-01 DIAGNOSIS — I5022 Chronic systolic (congestive) heart failure: Secondary | ICD-10-CM

## 2023-06-01 DIAGNOSIS — I10 Essential (primary) hypertension: Secondary | ICD-10-CM

## 2023-06-01 DIAGNOSIS — I639 Cerebral infarction, unspecified: Secondary | ICD-10-CM

## 2023-06-01 DIAGNOSIS — Z8673 Personal history of transient ischemic attack (TIA), and cerebral infarction without residual deficits: Secondary | ICD-10-CM

## 2023-06-01 DIAGNOSIS — C61 Malignant neoplasm of prostate: Secondary | ICD-10-CM

## 2023-06-01 MED ORDER — METFORMIN HCL 1000 MG PO TABS: 1000.0000 mg | ORAL_TABLET | Freq: Two times a day (BID) | ORAL | 5 refills | Status: AC

## 2023-06-01 MED ORDER — GABAPENTIN 100 MG PO CAPS: 100.0000 mg | ORAL_CAPSULE | Freq: Every day | ORAL | 5 refills | Status: DC

## 2023-06-01 NOTE — Progress Notes (Signed)
Cardiology Office Note    Date:  06/01/2023  ID:  [name], [name] 13-Feb-1946, MRN [id number] PCP:  Nelwyn Salisbury, MD  Cardiologist:  Olga Millers, MD  Electrophysiologist:  None   Chief Complaint: Hospital follow up for HF with mildly reduced EF   History of Present Illness: .    [name] [name] is a 78 y.o. male with visit-pertinent history of CAD s/p  x3 with LIMA to LAD, SVG to OM1, SVG to RCA in [place], [place]  in 2017, CVA's (1980), PAD, prediabetes, prostate cancer.  In 09/2021 patient was admitted for CVA, during admission he reported syncopal episodes after being out working outside in the heat with [employer] orientation and dizziness.  Neurology suspected left frontal acute infarct.  He was discharged with 3 months of DAPT.  In 11/2021 he had follow-up in the clinic and reported labile blood pressure with occasional head fullness.  He also noted some left-sided weakness and fatigue.  He was encouraged to monitor his blood pressure at home and increase physical activity as tolerated.  He was seen by Dr. Allyson Sabal on 01/12/2022 and patient was doing well.  He only reported occasional shaking episodes in his left leg.  On 03/22/2022 he was seen in cardiology clinic, he can dilated to feel well.  He reported more physical activity.  EKG was normal sinus rhythm.  Cardiac event monitor worn after CVA showed sinus rhythm, sinus bradycardia, sinus tach and 1 brief episode of NSVT.  He was instructed to continue all current medications.  On 05/21/2023 he presented to the ED with complaints of difficulty speaking, slurred speech and confusion.  Code stroke was called in the ED.  Seen by neurology.  CT head showed new areas of cortical hypoattenuation in the left frontal operculum, he was outside window for treatment.  MRI completed shows left middle frontal gyrus acute/subacute infarction.  Echo on 05/22/2023 indicated LVEF of 45 to 50%, hypokinesis of distal anteroseptal wall/apex, G1 DD.  Lower extremity  vascular ultrasound on 05/22/2023 showed acute deep vein thrombosis involving the left common femoral vein and left proximal profunda vein.  Cardiology was consulted given mild decline in EF.  During admission he denied any anginal symptoms prior to admission or shortness of breath.  Patient had been on aspirin and Plavix prior to admission, this was continued up until underwent lower extremity Dopplers and was found to have acute DVT, transitioned over to Eliquis.  On exam he did not exhibit any signs of volume overload and denied any symptoms prior to admission.  GDMT was limited in setting of acute CVA and allowing for permissive hypertension.  Today he presents for follow-up with his wife.  He reports that he is doing okay, notes that he has been overall fatigued following his stroke.  He is still having difficulty with word finding.  He denies any shortness of breath, lower extremity edema, orthopnea or PND.  He denies chest pain, notes that he has had a general "chest soreness" since his bypass surgery in 2017 that is unchanged.  He notes occasional problems with acid reflux, has recently started on Protonix and is currently relieved with Tums.  He notes that he does have problems with neuropathy in his feet, recently started on gabapentin by his PCP.  He reports that he has been having to get up 4-5 times at night to urinate, related to his prostate cancer, notes that this is contributing to his fatigue as he does not sleep well.  He  denies any palpitations, dizziness, lightheadedness, presyncope or syncope.  Labwork independently reviewed: 05/22/2023: Sodium 139, potassium 3.8, creatinine 0.84, hemoglobin 14.7, hematocrit 41.5 ROS: .   Today he denies chest pain, shortness of breath, lower extremity edema, palpitations, melena, hematuria, hemoptysis, diaphoresis, presyncope, syncope, orthopnea, and PND.  All other systems are reviewed and otherwise negative. Studies Reviewed: [name]    EKG:  EKG is  ordered today, personally reviewed, demonstrating  EKG Interpretation Date/Time:  Thursday June 01 2023 15:21:05 EST Ventricular Rate:  66 PR Interval:  154 QRS Duration:  84 QT Interval:  406 QTC Calculation: 425 R Axis:   71  Text Interpretation: Normal sinus rhythm ST & T wave abnormality T wave inversion in anterolateral leads, new in lateral leads Confirmed by Reather Littler [phone]) on 06/01/2023 4:52:09 PM   CV Studies:  Cardiac Studies & Procedures   ______________________________________________________________________________________________     ECHOCARDIOGRAM  ECHOCARDIOGRAM COMPLETE 05/22/2023  Narrative ECHOCARDIOGRAM REPORT    Patient Name:   [name] [name] Date of Exam: 05/22/2023 Medical Rec #:  [id number]    Height:       65.0 in Accession #:    [id number]   Weight:       170.0 lb Date of Birth:  [birth year]    BSA:          1.846 m Patient Age:    77 years     BP:           119/73 mmHg Patient Gender: M            HR:           62 bpm. Exam Location:  Inpatient  Procedure: 2D Echo, Cardiac Doppler, Color Doppler and Intracardiac Opacification Agent  Indications:    stroke  History:        Patient has prior history of Echocardiogram examinations, most recent 10/07/2021. CAD, Prior CABG; PAD.  Sonographer:    Melissa Morford RDCS (AE, PE) Referring Phys: [id number] [facility] - Zyaire Dumas GOEL   Sonographer Comments: Apical window technically limited IMPRESSIONS   1. Hypokinesis of the distal anteroseptal wall and apex with overall mild LV dyfunction. 2. Left ventricular ejection fraction, by estimation, is 45 to 50%. The left ventricle has mildly decreased function. The left ventricle demonstrates regional wall motion abnormalities (see scoring diagram/findings for description). Left ventricular diastolic parameters are consistent with Grade I diastolic dysfunction (impaired relaxation). 3. Right ventricular systolic function is normal. The right ventricular size is  normal. 4. The mitral valve is normal in structure. No evidence of mitral valve regurgitation. No evidence of mitral stenosis. 5. The aortic valve is tricuspid. Aortic valve regurgitation is not visualized. Aortic valve sclerosis is present, with no evidence of aortic valve stenosis.  FINDINGS Left Ventricle: Left ventricular ejection fraction, by estimation, is 45 to 50%. The left ventricle has mildly decreased function. The left ventricle demonstrates regional wall motion abnormalities. Definity contrast agent was given IV to delineate the left ventricular endocardial borders. The left ventricular internal cavity size was normal in size. There is no left ventricular hypertrophy. Left ventricular diastolic parameters are consistent with Grade I diastolic dysfunction (impaired relaxation).  Right Ventricle: The right ventricular size is normal. Right ventricular systolic function is normal.  Left Atrium: Left atrial size was normal in size.  Right Atrium: Right atrial size was normal in size.  Pericardium: There is no evidence of pericardial effusion.  Mitral Valve: The mitral valve is normal in structure. No evidence of mitral valve regurgitation. No evidence  of mitral valve stenosis.  Tricuspid Valve: The tricuspid valve is normal in structure. Tricuspid valve regurgitation is not demonstrated. No evidence of tricuspid stenosis.  Aortic Valve: The aortic valve is tricuspid. Aortic valve regurgitation is not visualized. Aortic valve sclerosis is present, with no evidence of aortic valve stenosis.  Pulmonic Valve: The pulmonic valve was normal in structure. Pulmonic valve regurgitation is not visualized. No evidence of pulmonic stenosis.  Aorta: The aortic root is normal in size and structure.  Venous: The inferior vena cava was not well visualized.  IAS/Shunts: The interatrial septum was not well visualized.  Additional Comments: Hypokinesis of the distal anteroseptal wall and apex  with overall mild LV dyfunction.   LEFT VENTRICLE PLAX 2D LVIDd:         4.30 cm      Diastology LVIDs:         3.90 cm      LV e' medial:    4.13 cm/s LV PW:         0.80 cm      LV E/e' medial:  10.4 LV IVS:        0.90 cm      LV e' lateral:   5.11 cm/s LVOT diam:     2.00 cm      LV E/e' lateral: 8.4 LV SV:         38 LV SV Index:   20 LVOT Area:     3.14 cm  LV Volumes (MOD) LV vol d, MOD A2C: 111.0 ml LV vol d, MOD A4C: 141.5 ml LV vol s, MOD A2C: 59.0 ml LV vol s, MOD A4C: 91.6 ml LV SV MOD A2C:     52.0 ml LV SV MOD A4C:     141.5 ml LV SV MOD BP:      46.3 ml  LEFT ATRIUM           Index        RIGHT ATRIUM           Index LA diam:      3.40 cm 1.84 cm/m   RA Area:     20.60 cm LA Vol (A4C): 33.9 ml 18.36 ml/m  RA Volume:   67.30 ml  36.46 ml/m AORTIC VALVE LVOT Vmax:   70.50 cm/s LVOT Vmean:  48.100 cm/s LVOT VTI:    0.120 m  AORTA Ao Root diam: 3.30 cm Ao Asc diam:  3.35 cm  MITRAL VALVE MV Area (PHT): 5.42 cm    SHUNTS MV Decel Time: 140 msec    Systemic VTI:  0.12 m MV E velocity: 42.80 cm/s  Systemic Diam: 2.00 cm MV A velocity: 66.00 cm/s MV E/A ratio:  0.65  Olga Millers MD Electronically signed by Olga Millers MD Signature Date/Time: 05/22/2023/1:52:32 PM    Final    MONITORS  CARDIAC EVENT MONITOR 11/17/2021  Narrative SR/SB/ST Short run (9 beats) of NSVT       ______________________________________________________________________________________________      Current Reported Medications:.    Current Meds  Medication Sig   abiraterone acetate (ZYTIGA) 250 MG tablet Take 4 tablets (1,000 mg total) by mouth daily. Take on an empty stomach 1 hour before or 2 hours after a meal (Patient taking differently: Take 250 mg by mouth daily. Take on an empty stomach 1 hour before or 2 hours after a meal)   acetaminophen (TYLENOL) 500 MG tablet Take 1 tablet (500 mg total) by mouth every 6 (six) hours as needed.  APIXABAN (ELIQUIS)  VTE STARTER PACK (10MG  AND 5MG ) Take as directed on package: start with two-5mg  tablets twice daily for 7 days. On day 8, switch to one-5mg  tablet twice daily.   atorvastatin (LIPITOR) 40 MG tablet Take 1 tablet (40 mg total) by mouth daily.   calcium carbonate (TUMS EX) 750 MG chewable tablet Chew 0.5 tablets by mouth daily.   clopidogrel (PLAVIX) 75 MG tablet Take 1 tablet (75 mg total) by mouth daily.   empagliflozin (JARDIANCE) 10 MG TABS tablet Take 1 tablet (10 mg total) by mouth daily before breakfast.   gabapentin (NEURONTIN) 100 MG capsule Take 1 capsule (100 mg total) by mouth at bedtime.   metFORMIN (GLUCOPHAGE) 1000 MG tablet Take 1 tablet (1,000 mg total) by mouth 2 (two) times daily with a meal.   pantoprazole (PROTONIX) 40 MG tablet Take 1 tablet (40 mg total) by mouth daily.   polyethylene glycol powder (GLYCOLAX/MIRALAX) 17 GM/SCOOP powder Take 17 g by mouth in the morning and at bedtime.   predniSONE (DELTASONE) 5 MG tablet Take 1 tablet (5 mg total) by mouth daily with breakfast.    Physical Exam:    VS:  BP 102/64   Pulse 73   Ht 5\' 5"  (1.651 m)   Wt 167 lb 11.2 oz (76.1 kg)   SpO2 99%   BMI 27.91 kg/m    Wt Readings from Last 3 Encounters:  06/01/23 167 lb 11.2 oz (76.1 kg)  06/01/23 167 lb 11.2 oz (76.1 kg)  05/21/23 170 lb (77.1 kg)    GEN: Well nourished, well developed in no acute distress NECK: No JVD; No carotid bruits CARDIAC: RRR, no murmurs, rubs, gallops RESPIRATORY:  Clear to auscultation without rales, wheezing or rhonchi  ABDOMEN: Soft, non-tender, non-distended EXTREMITIES:  No edema; No acute deformity   Asessement and Plan:.    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 Today he appears euvolemic and well compensated on exam.  He denies shortness of breath, lower extremity edema, orthopnea or PND. Given lower blood pressure and recent CVA will defer  escalation of GDMT at this time. Patient will have soon follow up in March, can consider escalation at that time as BP allows.  Patient with history of CABG x 3 in 2017, given reduction in EF he will need ischemic evaluation, patient denies any anginal symptoms at this time.  Given recent CVA will wait until follow-up and plan for PET stress following. Discussed with patient that he should notify the office of weight gain of 2 to 3 pounds overnight or 5 pounds in a week. On Jardiance 10 mg daily.  Unable to escalate GDMT given low blood pressures. Check CBC and BMET today.   CAD: S/p CABG x3 with LIMA to LAD, SVG to OM1, SVG to RCA in [place], [place] in 2017.  Echo during admission with wall motion abnormalities however denied any anginal symptoms.  Ischemic evaluation deferred given acute stroke.  EKG today indicates normal sinus rhythm at 66 bpm with T wave inversions in anterior lateral leads, new in lateral leads. Today he denies anginal symptoms, notes that he has had a general chest soreness ever since his bypass surgery in 2017.  He also has intermittent problems with acid reflux, relieved with Tums, he was recently started on Protonix.  Given recent CVA we will plan to have patient follow-up next month and plan for PET stress following  given need for ischemic evaluation with mildly reduced EF and new T wave inversions in lateral leads.  Reviewed ED precautions.  Patient currently on Eliquis and Plavix per neurology given recent CVA.  CVA: Admitted on 05/21/2023.  MRI notable for left middle frontal gyrus acute/subacute infarction.  Patient notes that he is slowly improving, working with PT and OT.  Will have patient wear a 30-day cardiac monitor to rule out atrial fibrillation. He has been referred back to neurology, on Eliquis and Plavix per neurology. Check CBC and BMET.   DVT: During admission found to have a stents of acute DVT of left common femoral vein and left proximal profunda vein.  On  Eliquis.  HTN: Blood pressure today 102/64.  This morning he was 104/64.  Given low blood pressures unable to escalate GDMT at this time.  HLD: Last lipid profile in 05/22/2023 indicated total cholesterol 200, HDL 34, triglycerides 122 and LDL 142.  Started on Lipitor 40 mg daily.  Check fasting lipid profile and LFTs in 2 months.  DM type 2: A1c 11.5, felt to likely be related to daily prednisone as prior A1c was 6.1.  Patient was started on Jardiance and metformin by hospitalist, to follow-up with his PCP.  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  Following with oncology.    Disposition: F/u with Reather Littler, NP as scheduled in 06/2023   Signed, Rip Harbour, NP

## 2023-06-01 NOTE — Patient Instructions (Addendum)
Medication Instructions:  No changes *If you need a refill on your cardiac medications before your next appointment, please call your pharmacy*  Lab Work: Today we are going to draw CBC and Bmet In 2 month we are going to draw lipid panel and LFT If you have labs (blood work) drawn today and your tests are completely normal, you will receive your results only by: MyChart Message (if you have MyChart) OR A paper copy in the mail If you have any lab test that is abnormal or we need to change your treatment, we will call you to review the results.  Testing/Procedures: [employer] Cardiac Event Monitor Instructions  Your physician has requested you wear your cardiac event monitor for 30 days. [employer] may call or text to confirm a shipping address. The monitor will be sent to a land address via UPS. [employer] will not ship a monitor to a PO BOX. It typically takes 3-5 days to receive your monitor after it has been enrolled. [employer] will assist with USPS tracking if your package is delayed. The telephone number for [employer] is [phone]. Once you have received your monitor, please review the enclosed instructions. Instruction tutorials can also be viewed under help and settings on the enclosed cell phone. Your monitor has already been registered assigning a specific monitor serial # to you.  Billing and Self Pay Discount Information  [employer] has been provided the insurance information we had on file for you.  If your insurance has been updated, please call [employer] at [phone] to provide them with your updated insurance information.   [employer] offers a discounted Self Pay option for patients who have insurance that does not cover their cardiac event monitor or patients without insurance.  The discounted cost of a Self Pay Cardiac Event Monitor would be $225.00 , if the patient contacts [employer] at [phone] within 7 days of applying the monitor to make payment  arrangements.  If the patient does not contact [employer] within 7 days of applying the monitor, the cost of the cardiac event monitor will be $350.00.  Applying the monitor  Remove cell phone from case and turn it on. The cell phone works as IT consultant and needs to be within [employer] of you at all times. The cell phone will need to be charged on a daily basis. We recommend you plug the cell phone into the enclosed charger at your bedside table every night.  Monitor batteries: You will receive two monitor batteries labelled #1 and #2. These are your recorders. Plug battery #2 onto the second connection on the enclosed charger. Keep one battery on the charger at all times. This will keep the monitor battery deactivated. It will also keep it fully charged for when you need to switch your monitor batteries. A small light will be blinking on the battery emblem when it is charging. The light on the battery emblem will remain on when the battery is fully charged.  Open package of a Monitor strip. Insert battery #1 into black hood on strip and gently squeeze monitor battery onto connection as indicated in instruction booklet. Set aside while preparing skin.  Choose location for your strip, vertical or horizontal, as indicated in the instruction booklet. Shave to remove all hair from location. There cannot be any lotions, oils, powders, or colognes on skin where monitor is to be applied. Wipe skin clean with enclosed Saline wipe. Dry skin completely.  Peel paper labeled #1 off the back of the Monitor strip exposing the  adhesive. Place the monitor on the chest in the vertical or horizontal position shown in the instruction booklet. One arrow on the monitor strip must be pointing upward. Carefully remove paper labeled #2, attaching remainder of strip to your skin. Try not to create any folds or wrinkles in the strip as you apply it.  Firmly press and release the circle in the center of the  monitor battery. You will hear a small beep. This is turning the monitor battery on. The heart emblem on the monitor battery will light up every 5 seconds if the monitor battery in turned on and connected to the patient securely. Do not push and hold the circle down as this turns the monitor battery off. The cell phone will locate the monitor battery. A screen will appear on the cell phone checking the connection of your monitor strip. This may read poor connection initially but change to good connection within the next minute. Once your monitor accepts the connection you will hear a series of 3 beeps followed by a climbing crescendo of beeps. A screen will appear on the cell phone showing the two monitor strip placement options. Touch the picture that demonstrates where you applied the monitor strip.  Your monitor strip and battery are waterproof. You are able to shower, bathe, or swim with the monitor on. They just ask you do not submerge deeper than 3 feet underwater. We recommend removing the monitor if you are swimming in a lake, river, or ocean.  Your monitor battery will need to be switched to a fully charged monitor battery approximately once a week. The cell phone will alert you of an action which needs to be made.  On the cell phone, tap for details to reveal connection status, monitor battery status, and cell phone battery status. The green dots indicates your monitor is in good status. A red dot indicates there is something that needs your attention.  To record a symptom, click the circle on the monitor battery. In 30-60 seconds a list of symptoms will appear on the cell phone. Select your symptom and tap save. Your monitor will record a sustained or significant arrhythmia regardless of you clicking the button. Some patients do not feel the heart rhythm irregularities. [employer] will notify us of any serious or critical events.  Refer to instruction booklet for instructions on  switching batteries, changing strips, the Do not disturb or Pause features, or any additional questions.  Call [employer] at [phone], to confirm your monitor is transmitting and record your baseline. They will answer any questions you may have regarding the monitor instructions at that time.  Returning the monitor to [employer]  Place all equipment back into blue box. Peel off strip of paper to expose adhesive and close box securely. There is a prepaid UPS shipping label on this box. Drop in a UPS drop box, or at a UPS facility like [employer]. You may also contact [employer] to arrange UPS to pick up monitor package at your home.  Follow-Up: At [facility], you and your health needs are our priority.  As part of our continuing mission to provide you with exceptional heart care, we have created designated Provider Care Teams.  These Care Teams include your primary Cardiologist (physician) and Advanced Practice Providers (APPs -  Physician Assistants and Nurse Practitioners) who all work together to provide you with the care you need, when you need it.  We recommend signing up for the patient portal called "MyChart".  Sign up  information is provided on this After Visit Summary.  MyChart is used to connect with patients for Virtual Visits (Telemedicine).  Patients are able to view lab/test results, encounter notes, upcoming appointments, etc.  Non-urgent messages can be sent to your provider as well.   To learn more about what you can do with MyChart, go to [email or web address].    Your next appointment:   3 month(s)  Provider:   Olga Millers, MD

## 2023-06-01 NOTE — Progress Notes (Unsigned)
Patient ID: [name] [name], male   DOB: Aug 19, 1945, 78 y.o.   MRN: [id number] Patient enrolled for [employer]/ [employer] to ship a 30 day cardiac event monitor to his address on file.  Dr. Jens Som to read.

## 2023-06-01 NOTE — Progress Notes (Signed)
   Subjective:    Patient ID: [name] [name], male    DOB: 11/16/1945, 78 y.o.   MRN: [id number]  HPI Here to follow up a hospital stay from 05-21-23 to 05-23-23 for an acute ischemic stroke. He presented with dizziness and word finding difficulty. An MRA of the brain revealed an acute infarct of the left middle frontal gyrus as well as a remote infarct of the right paramedian pons. An ECHO showed an EF of 45-50% with hypokinesis of the distal anteroseptal wall and apex, along with Grade I diastolic dysfunction. His renal function was normal with a creatinine of 0.84 and GFR of >60. He was diagnosed with type 2 diabetes as his A1c was 11.5% (this was 6.4% in January 2024). He was found to have DVT's in the left common femoral vein and left proximal profunda. He was started on Eliquis and Plavix. He was also started on Metformin 500 mg BID and Jardiance 10 mg daily. Since going home his am fasting glucoses have been 139 and 153 this week. He started ST yesterday, and he has PT and OT coming up soon. Today he says he feels a little weak, but no SOB. His appetite is good. He also mentions having burning pains in his feet at night. These began several months ago.    Review of Systems  Constitutional: Negative.   Respiratory: Negative.    Cardiovascular: Negative.   Gastrointestinal: Negative.   Genitourinary: Negative.   Neurological:  Positive for speech difficulty and weakness. Negative for dizziness and numbness.       Objective:   Physical Exam Constitutional:      Appearance: Normal appearance.  Cardiovascular:     Rate and Rhythm: Normal rate and regular rhythm.     Pulses: Normal pulses.     Heart sounds: Normal heart sounds.  Pulmonary:     Effort: Pulmonary effort is normal.     Breath sounds: Normal breath sounds.  Musculoskeletal:     Right lower leg: No edema.     Left lower leg: No edema.  Neurological:     Mental Status: He is alert and oriented to person, place, and time.      Gait: Gait abnormal.     Comments: He has some mild trouble finding his words, no slurred speech   Psychiatric:        Mood and Affect: Mood normal.        Behavior: Behavior normal.        Thought Content: Thought content normal.           Assessment & Plan:  He is recovering from a recent stroke which has caused speech issues. He will follow up wit ST, OT, and PT. We will arrange for him to see Dr. Shon Millet soon (his neurologist). He has mild CHF, and he will follow up with Cardiology this afternoon. He has newly diagnosed type 2 diabetes. He will stay on Jardiance 10 mg daily, and we will increase the Metformin to 1000 mg BID. He will follow up with [place] for the diabetes in 4 weeks. He now has diabetic neuropathy, so he will try Gabapentin 100 mg at bedtime. He has two DVT's , and these are being treated with the Plavix and Eliquis. He will see Oncology next month to follow up on metastatic prostate cancer. We spent a total of (35   ) minutes reviewing records and discussing these issues.  Gershon Crane, MD

## 2023-06-02 ENCOUNTER — Ambulatory Visit: Payer: Medicare PPO | Admitting: Occupational Therapy

## 2023-06-02 ENCOUNTER — Ambulatory Visit: Payer: Medicare PPO | Admitting: Physical Therapy

## 2023-06-02 VITALS — BP 118/80 | HR 85

## 2023-06-02 VITALS — BP 121/82 | HR 84

## 2023-06-02 DIAGNOSIS — M6281 Muscle weakness (generalized): Secondary | ICD-10-CM | POA: Diagnosis not present

## 2023-06-02 DIAGNOSIS — R2689 Other abnormalities of gait and mobility: Secondary | ICD-10-CM | POA: Diagnosis not present

## 2023-06-02 DIAGNOSIS — R41841 Cognitive communication deficit: Secondary | ICD-10-CM | POA: Diagnosis not present

## 2023-06-02 DIAGNOSIS — R2681 Unsteadiness on feet: Secondary | ICD-10-CM | POA: Diagnosis not present

## 2023-06-02 DIAGNOSIS — R278 Other lack of coordination: Secondary | ICD-10-CM

## 2023-06-02 DIAGNOSIS — R29818 Other symptoms and signs involving the nervous system: Secondary | ICD-10-CM | POA: Diagnosis not present

## 2023-06-02 DIAGNOSIS — R4701 Aphasia: Secondary | ICD-10-CM | POA: Diagnosis not present

## 2023-06-02 NOTE — Therapy (Signed)
OUTPATIENT PHYSICAL THERAPY NEURO TREATMENT   Patient Name: [name] [name] MRN: [id number] DOB:[birth year], 78 y.o., male Today's Date: 06/02/2023   PCP: Nelwyn Salisbury, MD REFERRING PROVIDER: Starleen Arms, MD  END OF SESSION:  PT End of Session - 06/02/23 1408     Visit Number 2    Number of Visits 5    Date for PT Re-Evaluation 06/16/23    Authorization Type [employer]    PT Start Time 1406    PT Stop Time 1445    PT Time Calculation (min) 39 min    Equipment Utilized During Treatment Gait belt    Activity Tolerance Patient tolerated treatment well    Behavior During Therapy --   Confused             Past Medical History:  Diagnosis Date   CAD (coronary artery disease)    Constipation    Diabetes mellitus without complication (HCC)    borderline/ pre diabetic   NSTEMI (non-ST elevated myocardial infarction) (HCC)    at [place]   Prostate cancer (HCC)    Stroke (HCC)    L side - 40 years ago   Past Surgical History:  Procedure Laterality Date   CARDIAC CATHETERIZATION     CATARACT EXTRACTION Bilateral    05/2021; 07/2021   CORONARY ARTERY BYPASS GRAFT     at [facility]   Patient Active Problem List   Diagnosis Date Noted   Type 2 diabetes mellitus with diabetic neuropathy, unspecified (HCC) 06/01/2023   Chronic systolic CHF (congestive heart failure), NYHA class 2 (HCC) 06/01/2023   Left femoral vein DVT (HCC) 05/22/2023   Abnormal echocardiogram 05/22/2023   DNR (do not resuscitate) 05/22/2023   Acute CVA (cerebrovascular accident) (HCC) 05/21/2023   Current chronic use of inhaled steroid 05/21/2023   Prostate cancer (HCC) 10/14/2022   Constipation 10/14/2022   Depression 10/14/2022   Elevated PSA, greater than or equal to 20 ng/ml 07/14/2022   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   Neuropathic pain of hand, right 10/21/2021   Syncope 10/07/2021   Stroke (HCC) 10/07/2021   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 (HCC)     Acute ischemic stroke (HCC) 10/06/2021   CAD (coronary artery disease) 10/06/2021   PVD (peripheral vascular disease) (HCC) 04/15/2019   Dyslipidemia, goal LDL below 70 04/15/2019   History of CVA with residual deficit 04/15/2019   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 States he started taking gabapentin yesterday and his LUE went numb last night. "My arm had no control in it, it just flipped around". No falls.    Pt accompanied by: significant other, [name]   PERTINENT HISTORY: CAD with CABG in 2017, history of CVA in the 1980s, peripheral arterial disease, prediabetes, prostate cancer    PAIN:  Are you having pain? No  PRECAUTIONS: Fall  RED FLAGS: None   WEIGHT BEARING RESTRICTIONS: No  FALLS: Has patient fallen in last 6 months? No  LIVING ENVIRONMENT: Lives with: lives with their spouse Lives in: House/apartment Stairs: Yes: External: 5 steps; on right going up, on left going up, and can reach both Has following equipment at home: None  PLOF: Independent  PATIENT GOALS: "Just speech"   OBJECTIVE:  Note: Objective measures were completed at Evaluation unless otherwise noted.  DIAGNOSTIC FINDINGS: MRI of brain on 05/21/23   IMPRESSION: 1. Acute/subacute infarct of the left middle frontal gyrus. 2. Remote encephalomalacia of the left superior frontal gyrus and left parietal lobe. 3. 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 4. Periventricular  and subcortical T2 hyperintensities bilaterally are otherwise mildly advanced for age. This likely reflects the sequela of chronic microvascular ischemia.  CT Angio of head and neck from 05/21/23    IMPRESSION: 1. 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 2. Moderate stenoses at the dural margin of both vertebral arteries and decreased size of the more distal basilar artery, likely reflecting decreased perfusion. 3. 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 4. Moderate stenosis in the distal left P2 segment just proximal to the bifurcation. 5. Atherosclerotic changes at the carotid bifurcations bilaterally without significant stenosis. 6.  Aortic Atherosclerosis (ICD10-I70.0).  CT of head from 05/21/23   IMPRESSION: 1. New areas of cortical hypoattenuation in the left frontal operculum just posterior to the more remote infarct. This likely represents acute or subacute infarct. 2. Remote infarcts of the high left frontal and parietal lobes demonstrate expected evolution. 3. Aspects is 9/10.   COGNITION: Overall cognitive status: Impaired and Expressive aphasia    SENSATION: Pt denies numbness/tingling in BLEs and BUEs, but reports burning in legs at night that cause him to stay awake.     POSTURE: rounded shoulders, forward head, and increased thoracic kyphosis  LOWER EXTREMITY ROM:   WNL   Active  Right Eval Left Eval  Hip flexion    Hip extension    Hip abduction    Hip adduction    Hip internal rotation    Hip external rotation    Knee flexion    Knee extension    Ankle dorsiflexion    Ankle plantarflexion    Ankle inversion    Ankle eversion     (Blank rows = not tested)  LOWER EXTREMITY MMT:  Tested in seated position   MMT Right Eval Left Eval  Hip flexion 4- 4-  Hip extension    Hip abduction 4 4-  Hip adduction 4+ 4  Hip internal rotation    Hip external rotation    Knee flexion  4+ 4  Knee extension 4+ 4  Ankle dorsiflexion 4 4-  Ankle plantarflexion    Ankle inversion    Ankle eversion    (Blank rows = not tested)  BED MOBILITY:  Independent per pt   TRANSFERS: Assistive device utilized: None  Sit to stand: Modified independence Stand to sit: Modified independence Chair  to chair: Modified independence   GAIT: Gait pattern: step through pattern, decreased arm swing- Right, decreased arm swing- Left, decreased step length- Left, decreased stance time- Right, decreased stride length, decreased hip/knee flexion- Left, decreased ankle dorsiflexion- Right, decreased ankle dorsiflexion- Left, lateral hip instability, and wide BOS Distance walked: Various clinic distances  Assistive device utilized: None Level of assistance: Modified independence Comments: Pt ambulates w/decreased eccentric control on L side, but this is baseline for pt. No new instability noted since previous POC    VITALS  Vitals:   06/02/23 1415  BP: 121/82  Pulse: 84  SpO2: 99%                                                                                                                                  TREATMENT: Self-care/home management  Assessed vitals (see above) and WNL. Closely monitored SpO2 throughout session but maintained between 96-99%    Ther Act  SciFit multi-peaks level 5.5 for 8 minutes using BUE/BLEs for neural priming for reciprocal movement, dynamic cardiovascular warmup and increased amplitude of stepping. Pt requested rest break after 3 minutes, SpO2 at 96% and HR 102 bpm.  Pt required 4 minute rest break. At end of activity, SpO2 at 96% and HR 103 bpm. RPE of 5-6/10 following activity   Physical Performance   [facility] PT Assessment - 06/02/23 1435       Functional Gait  Assessment   Gait assessed  Yes    Gait Level Surface Walks 20 ft, slow speed, abnormal gait pattern, evidence for imbalance or deviates 10-15 in outside of the 12 in walkway width. Requires  more than 7 sec to ambulate 20 ft.   7.28s   Change in Gait Speed Cannot change speeds, deviates greater than 15 in outside 12 in walkway width, or loses balance and has to reach for wall or be caught.    Gait with Horizontal Head Turns Performs head turns smoothly with slight change in gait velocity (eg, minor disruption to smooth gait path), deviates 6-10 in outside 12 in walkway width, or uses an assistive device.    Gait with Vertical Head Turns Performs task with moderate change in gait velocity, slows down, deviates 10-15 in outside 12 in walkway width but recovers, can continue to walk.   Trip over L foot   Gait and Pivot Turn Pivot turns safely in greater than 3 sec and stops with no loss of balance, or pivot turns safely within 3 sec and stops with mild imbalance, requires small steps to catch balance.    Step Over Obstacle Is able to step over one shoe box (4.5 in total height) but must slow down and adjust steps to clear box safely. May require verbal cueing.    Gait with Narrow Base of Support Ambulates less than 4 steps heel to toe or cannot perform without assistance.    Gait with Eyes Closed Walks 20 ft, slow  speed, abnormal gait pattern, evidence for imbalance, deviates 10-15 in outside 12 in walkway width. Requires more than 9 sec to ambulate 20 ft.   17.44s   Ambulating Backwards Walks 20 ft, slow speed, abnormal gait pattern, evidence for imbalance, deviates 10-15 in outside 12 in walkway width.    Steps Alternating feet, must use rail.    Total Score 11    FGA comment: High fall risk            Discussed results of FGA and impact that cognition has on balance. Pt reports he feels more confused this date and was very drowsy during session, unsure if Gabapentin affecting this.     PATIENT EDUCATION: Education details: Results of FGA  Person educated: Patient and Spouse Education method: Explanation Education comprehension: verbalized understanding  HOME EXERCISE  PROGRAM: To be reviewed from previous POC: [id number]  GOALS: Goals reviewed with patient? Yes  STG = LTG due to POC length    LONG TERM GOALS: Target date: 06/09/2023   Pt will improve FGA to 15/30 for decreased fall risk   Baseline: 11/30 Goal status: REVISED   2.  Pt will improve gait velocity to at least 3.1 ft/s no AD for improved gait efficiency and independence   Baseline:  Goal status: INITIAL  ASSESSMENT:  CLINICAL IMPRESSION: Emphasis of skilled PT session on endurance and balance assessment via FGA. Pt fatigued very quickly on SciFit but vitals remained stable. Pt reports he has started gabapentin and his LUE went numb last night, unsure if related to the new medication. Pt is a high fall risk per his score on FGA but is most limited by poor processing and confusion than physical instability. Pt unable to follow simple cues and frequently forgot what he was doing mid-task. Continue POC.    OBJECTIVE IMPAIRMENTS: Abnormal gait, decreased activity tolerance, decreased balance, decreased endurance, decreased mobility, difficulty walking, decreased strength, and improper body mechanics   ACTIVITY LIMITATIONS: carrying, lifting, squatting, locomotion level, and caring for others  PARTICIPATION LIMITATIONS: medication management, driving, and yard work  PERSONAL FACTORS: Age, Past/current experiences, and 1 comorbidity: Chronic CVA  are also affecting patient's functional outcome.   REHAB POTENTIAL: Good  CLINICAL DECISION MAKING: Evolving/moderate complexity  EVALUATION COMPLEXITY: Moderate  PLAN:  PT FREQUENCY: 2x/week  PT DURATION: 2 weeks  PLANNED INTERVENTIONS: 97164- PT Re-evaluation, 97110-Therapeutic exercises, 97530- Therapeutic activity, 97112- Neuromuscular re-education, 97535- Self Care, 16109- Manual therapy, [phone]- Gait training, [phone]- Orthotic Fit/training, [phone]- Aquatic Therapy, [phone]- Electrical stimulation (manual), Patient/Family education,  Balance training, Stair training, Dry Needling, Joint mobilization, Vestibular training, and DME instructions  PLAN FOR NEXT SESSION:  Work on head turns, high level balance tasks. Review HEP and add to it prn. Endurance    Jill Alexanders Deiontae Rabel, PT, DPT 06/02/2023, 2:56 PM

## 2023-06-02 NOTE — Therapy (Signed)
OUTPATIENT OCCUPATIONAL THERAPY NEURO EVALUATION  Patient Name: [name] [name] MRN: [id number] DOB:Oct 28, 1945, 78 y.o., male Today's Date: 06/02/2023  PCP: Nelwyn Salisbury, MD  REFERRING PROVIDER: Elgergawy, Leana Roe, MD   END OF SESSION:  OT End of Session - 06/02/23 1522     Visit Number 2    Number of Visits 5   including eval   Date for OT Re-Evaluation 06/09/23    Authorization Type [employer] 2025, VL: [place], Auth Required    OT Start Time 1449    OT Stop Time 1510   pt requested to end therapy session early   OT Time Calculation (min) 21 min    Activity Tolerance Patient limited by lethargy;Patient limited by fatigue    Behavior During Therapy Flat affect              Past Medical History:  Diagnosis Date   CAD (coronary artery disease)    Constipation    Diabetes mellitus without complication (HCC)    borderline/ pre diabetic   NSTEMI (non-ST elevated myocardial infarction) (HCC)    at [place]   Prostate cancer [facility])    Stroke (HCC)    L side - 40 years ago   Past Surgical History:  Procedure Laterality Date   CARDIAC CATHETERIZATION     CATARACT EXTRACTION Bilateral    05/2021; 07/2021   CORONARY ARTERY BYPASS GRAFT     at [facility]   Patient Active Problem List   Diagnosis Date Noted   Type 2 diabetes mellitus with diabetic neuropathy, unspecified (HCC) 06/01/2023   Chronic systolic CHF (congestive heart failure), NYHA class 2 (HCC) 06/01/2023   Left femoral vein DVT (HCC) 05/22/2023   Abnormal echocardiogram 05/22/2023   DNR (do not resuscitate) 05/22/2023   Acute CVA (cerebrovascular accident) (HCC) 05/21/2023   Current chronic use of inhaled steroid 05/21/2023   Prostate cancer (HCC) 10/14/2022   Constipation 10/14/2022   Depression 10/14/2022   Elevated PSA, greater than or equal to 20 ng/ml 07/14/2022   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   Neuropathic pain of hand, right 10/21/2021   Syncope 10/07/2021   Stroke (HCC) 10/07/2021   Cerebrovascular  accident (CVA) due to occlusion of cerebral artery (HCC)    Acute ischemic stroke (HCC) 10/06/2021   CAD (coronary artery disease) 10/06/2021   PVD (peripheral vascular disease) (HCC) 04/15/2019   Dyslipidemia, goal LDL below 70 04/15/2019   History of CVA with residual deficit 04/15/2019   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    ONSET DATE: 05/24/23 (referral date)  REFERRING DIAG: I63.9 (ICD-10-CM) - CVA (cerebrovascular accident) (HCC)   THERAPY DIAG:  Other lack of coordination  Other symptoms and signs involving the nervous system  Rationale for Evaluation and Treatment: Rehabilitation  SUBJECTIVE:   SUBJECTIVE STATEMENT: Pt reported feeling a bit tired after PT. Pt reported no updates. Pt reported feeling a little better after recently not feeling well.  Pt accompanied by: self and significant other (wife: [name] [name])  PERTINENT HISTORY: Per 05/23/23 MD D/C Summary: "metastatic prostate CA, prior CVA, CAD, and DM who presented on 2/9 with word finding difficulties and dizziness, found to have acute/subacute infarct in the the L middle frontal gyrus. Neurology consulted. He was taking ASA and Plavix on alternating days PTA. DVT US with LLE DVT, started on Eliquis."   Per 05/23/23 MD D/C Summary: "CTA head and neck with high-grade stenosis in proximal V1 segment,moderate stenoses at dural margin of both vertebral arteries, moderate stenoses of cavernous ICAs bilaterally, moderate stenosis in  distal left P2 segment  -MRI acute/subacute infarct of the left middle frontal gyrus, remote encephalomalacia of left superior frontal gyrus and left parietal lobe, remote lacunar infarct of right paramedian pons, chronic microvascular ischemic disease"  PRECAUTIONS: Fall, no driving  WEIGHT BEARING RESTRICTIONS: No  PAIN:  Are you having pain? No  FALLS: Has patient fallen in last 6 months? No  LIVING ENVIRONMENT: Lives with: lives with their spouse Lives in: House/apartment Stairs: Yes: External: 5  steps; can reach both Has following equipment at home: Single point cane  PLOF: Independent   PATIENT GOALS: Pt unable to identify goal. Per pt's spouse "another tune-up."  OBJECTIVE:  Note: Objective measures were completed at Evaluation unless otherwise noted.  HAND DOMINANCE: Right  ADLs: Overall ADLs: ind Transfers/ambulation related to ADLs: Eating: ind Grooming: ind, assistance for shaving d/t safety (per pt: pt taking blood thinner medication and wants to avoid accidental cuts/scrapes) UB Dressing: ind, sometimes assistance for jackets d/t difficulty with shoulder IR to reach other sleeve, some difficulty with zippers for "cheap zippers, no problems with high quality zippers." Pt ind demo'd good ability to engage/manipulate zipper today during eval. LB Dressing: ind Toileting: ind Bathing: ind Tub Shower transfers: ind "carefully," reported preference for tub transfer bench though insurance does not cover per pt report Equipment: Long handled sponge and tub/shower  IADLs: Shopping: dependent, pt assists with creating shopping list Light housekeeping: ind laundry, spouse completes dishwashing Meal Prep: ind, some assistance for cutting food d/t safety (pt taking blood thinner medication and wants to avoid accidental cuts/scrapes), ind with stovetop, oven, and microwave meals after food is cut up Community mobility: currently not driving Medication management: spouse assists  Handwriting:  Pt reported "terrible" handwriting. Pt requested to work on handwriting a little bit.  MOBILITY STATUS: Independent  POSTURE COMMENTS:  Ind sitting balance  ACTIVITY TOLERANCE: Activity tolerance: Pt paces self and tolerates tasks well.  FUNCTIONAL OUTCOME MEASURES: Quick Dash: 0% deficit    UPPER EXTREMITY ROM:    Active ROM Right eval Left eval  Shoulder flexion [facility]  Shoulder abduction    Shoulder adduction    Shoulder extension    Shoulder internal rotation     Shoulder external rotation    Elbow flexion    Elbow extension    Wrist flexion    Wrist extension    Wrist ulnar deviation    Wrist radial deviation    Wrist pronation    Wrist supination    (Blank rows = not tested)  UPPER EXTREMITY MMT:     MMT Right eval Left eval  Shoulder flexion 5 4+  Shoulder abduction    Shoulder adduction    Shoulder extension    Shoulder internal rotation    Shoulder external rotation    Middle trapezius    Lower trapezius    Elbow flexion    Elbow extension    Wrist flexion    Wrist extension    Wrist ulnar deviation    Wrist radial deviation    Wrist pronation    Wrist supination    (Blank rows = not tested)  HAND FUNCTION: Grip strength: Right: 59, 55, 60 (58 lbs average) lbs; Left: 55, 46, 52 (51 lbs average) lbs  Pt reported no concerns related to strength of BUE for functional tasks.  COORDINATION: 9 Hole Peg test: Right: 31 sec; Left: 43 sec  SENSATION: Pt reported no numbness/tingling of BUE.  EDEMA: none noted  MUSCLE TONE: RUE: Within functional limits  and LUE: Within functional limits  COGNITION: Overall cognitive status:  see below  Pt required increased processing time and demo'd delayed response time. Pt responded to questions and conversation appropriately and demo'd good insights into deficits and abilities.  VISION: Subjective report: Pt reported no changes to vision. Pt accurately read clock on wall. Pt reported no difficulty with navigating environment.  Baseline vision: No visual deficits Visual history: cataracts and cataract surgery  PERCEPTION: Not tested  PRAXIS: Not tested  OBSERVATIONS: Pt ambulated ind without A/E. Pt demo'd good insights into safety precautions for daily tasks.                                                                                                                             TREATMENT DATE:    TherAct HEP update: OT initiated LUE FM coordination HEP (handout  provided, see pt instructions) - to improve L UE FM coordination, dexterity, proprioception. Handout provided, see pt instructions. Pt returned demonstration of each of the following exercise: Picking up objects and placing in a container with slot Stacking towers of coins  Finger-to-palm then palm-to-finger translation of small items Unable to continue reviewing exercises per handout, see below for additional details.   Self Care Pt appeared more lethargic with flat affect today compared to previous OT session. Pt reported hot flashes today and said hot flashes were "definitely" d/t medication. OT provided pt with water and assessed vitals. See vital signs. Pt denied dizziness/lightheadedness. Vitals within therapeutic paramters. OT educated pt and spouse on vital signs and parameters. Pt and spouse verbalized understanding.  However, pt requested to end therapy session early today. Therefore tasks stopped. OT recommended to pt to contact MD about symptoms though pt reported "hot flashes" occur "every day" and pt reported that MD already stated these symptoms are side effects of medication.   PATIENT EDUCATION: Education details: see today's treatment Person educated: Patient and Spouse Education method: Explanation Education comprehension: verbalized understanding  HOME EXERCISE PROGRAM: 06/02/23 - LUE FM coordination    GOALS: Goals reviewed with patient? Yes  SHORT TERM GOALS: Target date: 06/09/23  Pt will be ind with LUE HEP. Baseline: new to outpt OT Goal status: INITIAL  2.  Pt will be ind with handwriting HEP. Baseline: new to outpt OT Goal status: INITIAL  ASSESSMENT:  CLINICAL IMPRESSION: Pt limited in participation d/t low energy levels for therapy and pt reported "hot flashes." Vitals within therapeutic parameters, see vital signs, though pt requested to end therapy session early today. Pt would benefit from skilled OT services in the outpatient setting to work on  impairments as noted below to help pt return to PLOF as able.     PERFORMANCE DEFICITS: in functional skills including IADLs, coordination, dexterity, Fine motor control, Gross motor control, mobility, balance, and UE functional use, cognitive skills including memory and thought, expressive language, processing time, and psychosocial skills including environmental adaptation.   IMPAIRMENTS: are limiting patient from IADLs.  CO-MORBIDITIES: may have co-morbidities  that affects occupational performance. Patient will benefit from skilled OT to address above impairments and improve overall function.  MODIFICATION OR ASSISTANCE TO COMPLETE EVALUATION: No modification of tasks or assist necessary to complete an evaluation.  OT OCCUPATIONAL PROFILE AND HISTORY: Problem focused assessment: Including review of records relating to presenting problem.  CLINICAL DECISION MAKING: LOW - limited treatment options, no task modification necessary  REHAB POTENTIAL: Good  EVALUATION COMPLEXITY: Low    PLAN:  OT FREQUENCY: 2x/week  OT DURATION: 2 weeks  PLANNED INTERVENTIONS: 97168 OT Re-evaluation, 97535 self care/ADL training, 03474 therapeutic exercise, 97530 therapeutic activity, 97112 neuromuscular re-education, 97140 manual therapy, functional mobility training, visual/perceptual remediation/compensation, energy conservation, coping strategies training, and DME and/or AE instructions  RECOMMENDED OTHER SERVICES: PT eval already completed, recommended to schedule SLP eval (SLP referral available)  CONSULTED AND AGREED WITH PLAN OF CARE: Patient  PLAN FOR NEXT SESSION:  Finish reviewing FM coordination HEP Shoulder IR HEP to improve ability to don jacket Handwriting HEP FM coordination activities  Wynetta Emery, OT 06/02/2023, 4:24 PM

## 2023-06-05 ENCOUNTER — Encounter: Payer: Self-pay | Admitting: Neurology

## 2023-06-05 ENCOUNTER — Ambulatory Visit: Payer: Medicare PPO | Admitting: Neurology

## 2023-06-05 VITALS — BP 116/76 | HR 86 | Ht 65.0 in | Wt 167.8 lb

## 2023-06-05 DIAGNOSIS — I63312 Cerebral infarction due to thrombosis of left middle cerebral artery: Secondary | ICD-10-CM | POA: Diagnosis not present

## 2023-06-05 DIAGNOSIS — E1140 Type 2 diabetes mellitus with diabetic neuropathy, unspecified: Secondary | ICD-10-CM | POA: Diagnosis not present

## 2023-06-05 DIAGNOSIS — I6522 Occlusion and stenosis of left carotid artery: Secondary | ICD-10-CM | POA: Diagnosis not present

## 2023-06-05 DIAGNOSIS — I82412 Acute embolism and thrombosis of left femoral vein: Secondary | ICD-10-CM | POA: Diagnosis not present

## 2023-06-05 DIAGNOSIS — I2510 Atherosclerotic heart disease of native coronary artery without angina pectoris: Secondary | ICD-10-CM | POA: Diagnosis not present

## 2023-06-05 DIAGNOSIS — C61 Malignant neoplasm of prostate: Secondary | ICD-10-CM | POA: Diagnosis not present

## 2023-06-05 DIAGNOSIS — E785 Hyperlipidemia, unspecified: Secondary | ICD-10-CM

## 2023-06-05 NOTE — Telephone Encounter (Signed)
 Pt had a f/u appointment with Dr Clent Ridges for this, pt declined to use the [facility]

## 2023-06-05 NOTE — Patient Instructions (Signed)
 Continue Eliquis Continue all other medications as prescribed For headaches, treat with Tylenol/acetaminophen Continue physical therapy and speech therapy Follow up 6 months.

## 2023-06-05 NOTE — Progress Notes (Signed)
 NEUROLOGY FOLLOW UP OFFICE NOTE  Isiac [name] [id number]  Assessment/Plan:   Acute left middle frontal gyrus infarct, query hypercoagulable state in setting of prostate cancer DVT left common femoral vein Prostate cancer with metastasis to bone Acute heart failure Intracranial stenosis Type 2 diabetes mellitus, uncontrolled Hyperlipidemia Coronary artery disease  1  Secondary stroke prevention as managed by primary care team/cardiology/oncology:  - Eliquis (on Plavix for HF)  - Atorvastatin.  LDL goal less than 70.    - Hgb A1c goal less than 7  - Normotensive blood pressure.  I would avoid hypotension in order to ensure adequate blood flow through the ICA stenosis.  2. Continue PT/Speech therapy 3. For headaches, advised to take Tylenol 4.  Follow up 6 months   Total time in chart and face to face with patient and wife:  36 minutes  Subjective:  [name] [name] is a 78 year old right-handed male with CAD s/p NSTEMI/CABG, DM II, metastatic prostate cancer to the bone and history of stroke who presents for recent stroke.  History supplemented by hospital records.  Accompanied by his wife.  UPDATE: Current medications:  Eliquis, atorvastatin 40mg , Jardiance, metformin  On 05/23/2023, patient developed acute onset of confusion and dysarthria.  Words weren't coming out right.  Kept repeating same thing over and over.  No new unilateral weakness (still with residual left sided weakness from previous stroke).  Brought to the hospital were MRI revealed acute infarct in the left middle frontal gyrus.  He had been taking ASA and Plavix on alternating days.  CTA of head and neck revealed high-grade stenoses in proximal V1 segments, moderate stenoses at dural margin of both vertebral arteries, moderate stenoses of cavernous ICAs bilaterally, moderate stenosis in distal left P2 segment.  2D echo showed EF 45-50%, hypokinesis of distal anteroseptal wall and apex with mild LV dysfunction, grade 1  diastolic dysfunction, normal left atrial size, interatrial septum not well-visualized.  Bilateral lower extremity US found DVT in left common femoral vein and left proximal profunda vein.  LDL 142 and Hgb A1c 11.5.  Discharged on Eliquis and atorvastatin.  Feels okay.  Still struggles with getting words out and some understanding.  In PT and speech therapy.  Has not been having the left leg shaking.  Sometimes has occasional mild headache which he takes ASA that is effective.    HISTORY: Patient had a stroke in 1980 presenting with left sided hemiparesis.  No specific cause was identified.  Still had a some residual left foot drop.   I previously saw patient in April 2021 for right arm weakness.  MRI of brain revealed subacute to chronic posterior left MCA territory infarct involving the left parietal cortex.  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  He was advised to continue Plavix.  Echocardiogram was ordered but patient never had performed and was lost to follow up.  He was admitted to [facility] on 10/06/2021 for syncope when he was outside pulling the cord to start the weed eater.  No palpitations..  He had a syncopal episode the previous day as well as a near syncopal earlier in the month which was attributed to dehydration.  CT head showed possible evolving acute left frontal infarct.  MRI of brain confirmed acute left frontal infarct as well as chronic left frontoparietal infarct.  CTA of head and neck revealed bilateral (left greater than right) siphon severe stenosis and  moderate to severe left vertebral artery stenosis.  2D echocardiogram showed EF 50-55% with hypokinesis of left venticular, apical inferior wall and no interatrial shunt.  LDL was 134 and Hgb A1c 6.4.  Prior to admission, he was alternating everyday between ASA 81mg  and Plavix.  He was discharged on DAPT for 3 months due to high-grade stenosis  of left ICA siphon, followed by monotherapy o either ASA or Plavix.  He was also started on Crestor 20mg .  4 week cardiac event monitor in July 2023 was negative for a fib.   Since the stroke in June 2023, he has had intermittent episodes of left leg weakness - He will suddenly feel dizzy, like he may pass out.  If he stands up, his left leg will start dragging.  During an episode, after climbing into bed, his left leg will also start shaking and feels a tingling sensation.  He takes his blood pressure, which is elevated (SBP 150s, usually 110-120).  Lasts about 10 minutes.  No pain but if he puts a heating pad on the leg, it calms it down.  Having a bowel movement also helps.  Frequency varies.  May occur every one to two months.   EEG performed on 05/03/2022 to evaluate episodic leg shaking which was normal.  He did not have the MRI of brain and CTA head and neck performed.  He hasn't had the left leg shaking for a while, so I don't think imaging is indicated at this time.  Statins:  Crestor and Lipitor both caused dizziness  PAST MEDICAL HISTORY: Past Medical History:  Diagnosis Date   CAD (coronary artery disease)    Constipation    Diabetes mellitus without complication (HCC)    borderline/ pre diabetic   NSTEMI (non-ST elevated myocardial infarction) (HCC)    at [place]   Prostate cancer (HCC)    Stroke (HCC)    L side - 40 years ago    MEDICATIONS: Current Outpatient Medications on File Prior to Visit  Medication Sig Dispense Refill   abiraterone acetate (ZYTIGA) 250 MG tablet Take 4 tablets (1,000 mg total) by mouth daily. Take on an empty stomach 1 hour before or 2 hours after a meal (Patient taking differently: Take 250 mg by mouth daily. Take on an empty stomach 1 hour before or 2 hours after a meal) 4 tablet 0   acetaminophen (TYLENOL) 500 MG tablet Take 1 tablet (500 mg total) by mouth every 6 (six) hours as needed. 30 tablet 0   APIXABAN (ELIQUIS) VTE STARTER PACK (10MG  AND  5MG ) Take as directed on package: start with two-5mg  tablets twice daily for 7 days. On day 8, switch to one-5mg  tablet twice daily. 74 each 0   atorvastatin (LIPITOR) 40 MG tablet Take 1 tablet (40 mg total) by mouth daily. 30 tablet 0   calcium carbonate (TUMS EX) 750 MG chewable tablet Chew 0.5 tablets by mouth daily.     clopidogrel (PLAVIX) 75 MG tablet Take 1 tablet (75 mg total) by mouth daily. 30 tablet 0   empagliflozin (JARDIANCE) 10 MG TABS tablet Take 1 tablet (10 mg total) by mouth daily before breakfast. 30 tablet 0   gabapentin (NEURONTIN) 100 MG capsule Take 1 capsule (100 mg total) by mouth at bedtime. 30 capsule 5   metFORMIN (GLUCOPHAGE) 1000 MG tablet Take 1 tablet (1,000 mg total) by mouth 2 (two) times daily with a meal. 60 tablet 5   pantoprazole (PROTONIX) 40 MG tablet Take 1 tablet (40  mg total) by mouth daily. 90 tablet 0   polyethylene glycol powder (GLYCOLAX/MIRALAX) 17 GM/SCOOP powder Take 17 g by mouth in the morning and at bedtime. 17 g 0   predniSONE (DELTASONE) 5 MG tablet Take 1 tablet (5 mg total) by mouth daily with breakfast. 1 tablet 0   No current facility-administered medications on file prior to visit.    ALLERGIES: No Known Allergies  FAMILY HISTORY: Family History  Problem Relation Age of Onset   Dementia Mother        lived to 47 years   Liver disease Father    Diabetes Sister    Cirrhosis Brother    Cancer - Colon Neg Hx    Colon polyps Neg Hx    Rectal cancer Neg Hx    Esophageal cancer Neg Hx    Stomach cancer Neg Hx    Pancreatic cancer Neg Hx       Objective:  Blood pressure 120/66, pulse 61, height 5\' 5"  (1.651 m), weight 156 lb 12.8 oz (71.1 kg), SpO2 95 %.. General: No acute distress.  Patient appears well-groomed.   Head:  Normocephalic/atraumatic Eyes:  Fundi examined but not visualized Neck: supple, no paraspinal tenderness, full range of motion Heart:  Regular rate and rhythm Lungs:  Clear to auscultation  bilaterally Back: No paraspinal tenderness Neurological Exam: alert and oriented to person, place, and time.  Speech fluent and not dysarthric, language intact.  CN II-XII intact. Bulk and tone normal, muscle strength 5-/5 left elbow flexion/extension, left knee extension/flexion, otherwise 5/5.  Sensation to pinprick and vibratory sensation intact.  Deep tendon reflexes 3+ left patellar, otherwise 2+  Finger to nose testing intact.  Gait with slight limp.  Romberg negative.   Shon Millet, DO  CC:  Gershon Crane, MD

## 2023-06-06 ENCOUNTER — Encounter: Payer: Self-pay | Admitting: Physical Therapy

## 2023-06-06 ENCOUNTER — Ambulatory Visit: Payer: Medicare PPO | Admitting: Occupational Therapy

## 2023-06-06 ENCOUNTER — Encounter: Payer: Self-pay | Admitting: Occupational Therapy

## 2023-06-06 ENCOUNTER — Ambulatory Visit: Payer: Medicare PPO | Admitting: Physical Therapy

## 2023-06-06 DIAGNOSIS — R278 Other lack of coordination: Secondary | ICD-10-CM

## 2023-06-06 DIAGNOSIS — R29818 Other symptoms and signs involving the nervous system: Secondary | ICD-10-CM

## 2023-06-06 NOTE — Therapy (Signed)
 [facility] [address] [place], [place], [address] Phone: [phone]   Fax:  [phone]  Patient Details  Name: [name] [name] MRN: [id number] Date of Birth: [birth year] Referring Provider:  No ref. provider found  Encounter Date: 06/06/2023  PHYSICAL THERAPY DISCHARGE SUMMARY  Visits from Start of Care: 2  Current functional level related to goals / functional outcomes: Mod I w/ADLs and gait w/no AD    Remaining deficits: L hemiparesis, decreased activity tolerance    Education / Equipment: HEP   Patient agrees to discharge. Patient goals were not met. Patient is being discharged due to  pt's wife called clinic to request all appointments be cancelled as neuro MD stated he "no longer needs it". Per MD note review, MD stated to "continue PT/SLP". However, will DC from PT per wife request.   Jill Alexanders Bobie Kistler, PT, DPT 06/06/2023, 3:09 PM  [facility] [address] [place], [place], [address] Phone: [phone]   Fax:  [phone]

## 2023-06-06 NOTE — Therapy (Signed)
 [facility] [address] [place], [place], [address] Phone: [phone]   Fax:  [phone]  Patient Details  Name: Jasiyah Paulding MRN: [id number] Date of Birth: 1945-05-23  Per message to OT from front desk: Pt's spouse called to cancel remaining appointments and reported that per Dr appt, neurologist advised no longer need PT/OT at this time. OT noted inconsistent report with 06/05/23 DO Office Visit Notes, which state "Continue PT/Speech therapy." However, OT D/C completed per pt request. See below:  OCCUPATIONAL THERAPY DISCHARGE SUMMARY  Visits from Start of Care: 2  Current functional level related to goals / functional outcomes: Unable to assess goals d/t pt not returning since 06/02/23 OT visit.  Remaining deficits: Pt may have some remaining functional deficits.  Education / Equipment: Pt has some needed materials and education. See tx notes for more details.    Patient goals were  unable to be assessed . Patient is being discharged due to the patient's request.  If additional OT services are recommended, a new OT referral will be required.   Wynetta Emery, OT 06/06/2023, 8:33 AM  [facility] [address] [place], [place], [address] Phone: [phone]   Fax:  [phone]

## 2023-06-07 ENCOUNTER — Encounter: Payer: Self-pay | Admitting: Speech Pathology

## 2023-06-07 ENCOUNTER — Ambulatory Visit: Payer: Medicare PPO | Admitting: Speech Pathology

## 2023-06-07 DIAGNOSIS — R41841 Cognitive communication deficit: Secondary | ICD-10-CM

## 2023-06-07 DIAGNOSIS — I2510 Atherosclerotic heart disease of native coronary artery without angina pectoris: Secondary | ICD-10-CM | POA: Diagnosis not present

## 2023-06-07 DIAGNOSIS — R4701 Aphasia: Secondary | ICD-10-CM | POA: Diagnosis not present

## 2023-06-07 DIAGNOSIS — R2681 Unsteadiness on feet: Secondary | ICD-10-CM | POA: Diagnosis not present

## 2023-06-07 DIAGNOSIS — I639 Cerebral infarction, unspecified: Secondary | ICD-10-CM | POA: Diagnosis not present

## 2023-06-07 DIAGNOSIS — R29818 Other symptoms and signs involving the nervous system: Secondary | ICD-10-CM | POA: Diagnosis not present

## 2023-06-07 DIAGNOSIS — I5022 Chronic systolic (congestive) heart failure: Secondary | ICD-10-CM | POA: Diagnosis not present

## 2023-06-07 DIAGNOSIS — M6281 Muscle weakness (generalized): Secondary | ICD-10-CM | POA: Diagnosis not present

## 2023-06-07 DIAGNOSIS — R2689 Other abnormalities of gait and mobility: Secondary | ICD-10-CM | POA: Diagnosis not present

## 2023-06-07 DIAGNOSIS — R278 Other lack of coordination: Secondary | ICD-10-CM | POA: Diagnosis not present

## 2023-06-07 NOTE — Patient Instructions (Signed)
   HW - Write 20 car parts body parts on the sheet of notebook paper  Do a web search and find out the days of the Power Tour  Keep utterances short and simple if Arvin is getting frustrated  Give him an extra second or two to process what has been said and an extra second to formulate his response  Use writing to help Marvel understand if he is getting frustrated or not understanding  Get the persons attention before you speak  Use eye contact and face the person you are speaking to  Be in close proximity to the person you are speaking to  Turn down any noise in the environment such as the TV, walk away from loud appliances, air conditioners, fans, dish washers etc  In large gatherings, sit or stay on the side not the center of the room  Try to sit with a wall behind you or in a corner so noise isn't coming at you from all directions when dining out or attending gatherings

## 2023-06-07 NOTE — Therapy (Signed)
 OUTPATIENT SPEECH LANGUAGE PATHOLOGY TREATMENT   Patient Name: [name] [name] MRN: [id number] DOB:Nov 10, 1945, 78 y.o., male Today's Date: 06/07/2023  PCP: [name] Salisbury, MD REFERRING PROVIDER: Starleen Arms, MD  END OF SESSION:  End of Session - 06/07/23 0932     Visit Number 2    Number of Visits 25    Date for SLP Re-Evaluation 08/23/23    Authorization Type [employer]    SLP Start Time 0930    SLP Stop Time  1015    SLP Time Calculation (min) 45 min    Activity Tolerance Patient tolerated treatment well             Past Medical History:  Diagnosis Date   CAD (coronary artery disease)    Constipation    Diabetes mellitus without complication (HCC)    borderline/ pre diabetic   NSTEMI (non-ST elevated myocardial infarction) (HCC)    at [place]   Prostate cancer (HCC)    Stroke (HCC)    L side - 40 years ago   Past Surgical History:  Procedure Laterality Date   CARDIAC CATHETERIZATION     CATARACT EXTRACTION Bilateral    05/2021; 07/2021   CORONARY ARTERY BYPASS GRAFT     at [facility]   Patient Active Problem List   Diagnosis Date Noted   Type 2 diabetes mellitus with diabetic neuropathy, unspecified (HCC) 06/01/2023   Chronic systolic CHF (congestive heart failure), NYHA class 2 (HCC) 06/01/2023   Left femoral vein DVT (HCC) 05/22/2023   Abnormal echocardiogram 05/22/2023   DNR (do not resuscitate) 05/22/2023   Acute CVA (cerebrovascular accident) (HCC) 05/21/2023   Current chronic use of inhaled steroid 05/21/2023   Prostate cancer (HCC) 10/14/2022   Constipation 10/14/2022   Depression 10/14/2022   Elevated PSA, greater than or equal to 20 ng/ml 07/14/2022   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   Neuropathic pain of hand, right 10/21/2021   Syncope 10/07/2021   Stroke (HCC) 10/07/2021   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 (HCC)    Acute ischemic stroke (HCC) 10/06/2021   CAD (coronary artery disease) 10/06/2021   PVD  (peripheral vascular disease) (HCC) 04/15/2019   Dyslipidemia, goal LDL below 70 04/15/2019   History of CVA with residual deficit 04/15/2019   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 MRI Brain shows acute infarct in the L middle frontal gyrus as well as remote pontine infarcts.   PAIN:  Are you having pain? No                                                                                                    TREATMENT DATE:    06/07/23: Targeted word finding naming 15 items re:  car parts with occasional min A. Targeted auditory comprehension generating 8 strategies to support auditory comprehension. Spouse reports Hermen get frustrated and snaps at her when she is talking too much. Education re: allowing more time to process and extra time to answer questions as well as saying 1 direction at a time. See Patient Instructions for other strategies. Modeled slow rate, pausing and allowing extra time to respond for spouse. Pt endorsed this is helpful. In conversation re: his prior profession, [name] benefits from limiting interruptions when he is conversing, and allowing for extended pauses for word finding without communication partner "jumping in." Educated and modeled for spouse.  To target word finding, grammar and complex sentence Audiological scientist (VNeST) was utilized. The pt generated 3 subjects and objects for 1 verb (drive), for a total of 3 subject objects. Pt required occasional semantic and questioning cues and extended time cues. Pt generated 1 complex sentences by  answering "wh" questions. Pt required occasional min verbal cues to generate complex sentences.     05/31/23 (eval): Evaluation completed, reviewed goals, aphasia homework provided    PATIENT EDUCATION: Education details: See Patient Instructions, See Today's Treatment, Compensations for aphasia and congition Person educated: Patient and Spouse Education method: Explanation, Demonstration, Verbal cues, and Handouts Education comprehension: verbalized understanding and needs further education   GOALS: Goals reviewed with patient? Yes  SHORT TERM GOALS: Target date: 06/28/23  Pt will name 15 personally relevant items in a category with rare min A Baseline: Goal status: INITIAL  2.  Pt will write biographical information self correcting errors with occasional min A Baseline:  Goal status: INITIAL  3.  Pt & caregiver will carryover 2 compensatory strategies to support auditory comprehension in conversation over 1 week Baseline:  Goal status: INITIAL  4.  Pt will generate grammatically complex mildly complex sentences in structured language task such as VNeST with occasional min A Baseline:  Goal status: INITIAL  5.  Pt will use verbal compensations for aphasia in structured naming task 4/5 opportunities with occasional min A Baseline:  Goal status: INITIAL  6.  Pt will write names of tools and car parts, self correcting as needed, to facilitate ordering of car parts on the internet Baseline:  Goal status: INITIAL  LONG TERM GOALS: Target date: 08/23/23  Pt will self correct agrammitism in conversation 4/5 opportunities with usual min A Baseline:  Goal status: INITIAL  2.  Pt will write or respond to emails at simple sentence level self correcting aphasic errors with occasional min A Baseline:  Goal status: INITIAL  3.  Pt will use external aid/calendar to manage his appointments and schedule/to do list with occasional min A from spouse Baseline:  Goal status:  INITIAL  4.  Pt will use verbal compensations for aphasia in simple conversation with occasional min A Baseline:  Goal status: INITIAL  5.  Pt will explain a personally relevant topic with cohesion to require 2 or less requests for clarification  Baseline:  Goal status: INITIAL  ASSESSMENT:  CLINICAL IMPRESSION: Patient is a 78 y.o. male who was seen today for mild cognitive impairments and moderate aphasia. Both he and his spouse Truddie Hidden state that [employer] and attention are at baseline and they are not concerned about these. They both express concern re: [place]'s difficulty communicating including word finding and expressing his thoughts accurately. Sajid reports that he is slow in getting out what he wants to say. 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 Truddie Hidden stated "he missed some words that would explain it better." Truddie Hidden has always managed the finances and she has been managing Tetsuo's medications since 2017. Auditory comprehension impaired at moderately complex yes/no, simple and moderately complex 1-2 step instructions. He frequently asked for repetition on yes/no questions and conversational questions. Oral reading was telegraphic like his narrative. For example, he read "Dog Sleep Bed" for The dog sleeps in the bed." In sentence generation to a picture, he stated "Girl chasing boy" instead of The girl is chasing he boy. Conversational speech is empty and vague requiring me to use frequent questioning cues for clarification. Confrontation naming 5/6; divergent naming - 10 animals and 2 "m" words in 1 minute. Written expression - aphasic errors on address and short sentences (knw/know; flen/fled; don/down; [id number] (his address). He is asking his wife how to spell words in order to look up or order car parts. 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    OBJECTIVE IMPAIRMENTS: include attention, memory, executive functioning, and aphasia. These impairments are limiting patient from managing medications, managing appointments, and effectively communicating at home and in community. Factors affecting potential to achieve goals and functional outcome are previous level of function.. Patient will benefit from skilled SLP services to address above impairments and improve overall function.  REHAB POTENTIAL: Good  PLAN:  SLP FREQUENCY: 2x/week  SLP DURATION: 12 weeks  PLANNED INTERVENTIONS: Language facilitation, Environmental controls, Cueing hierachy, Cognitive reorganization, and Internal/external aids    Bergen Magner, Radene Journey, CCC-SLP 06/07/2023, 9:32 AM

## 2023-06-08 DIAGNOSIS — I2510 Atherosclerotic heart disease of native coronary artery without angina pectoris: Secondary | ICD-10-CM | POA: Diagnosis not present

## 2023-06-08 DIAGNOSIS — I639 Cerebral infarction, unspecified: Secondary | ICD-10-CM | POA: Diagnosis not present

## 2023-06-08 DIAGNOSIS — I5022 Chronic systolic (congestive) heart failure: Secondary | ICD-10-CM | POA: Diagnosis not present

## 2023-06-09 ENCOUNTER — Ambulatory Visit: Payer: Medicare PPO | Admitting: Physical Therapy

## 2023-06-09 ENCOUNTER — Encounter: Payer: Medicare PPO | Admitting: Occupational Therapy

## 2023-06-12 ENCOUNTER — Ambulatory Visit: Payer: Medicare PPO | Attending: Internal Medicine

## 2023-06-12 DIAGNOSIS — R41841 Cognitive communication deficit: Secondary | ICD-10-CM | POA: Diagnosis not present

## 2023-06-12 DIAGNOSIS — R4701 Aphasia: Secondary | ICD-10-CM | POA: Insufficient documentation

## 2023-06-12 NOTE — Therapy (Signed)
 OUTPATIENT SPEECH LANGUAGE PATHOLOGY TREATMENT   Patient Name: [name] [name] MRN: [id number] DOB:1946/02/06, 78 y.o., male Today's Date: 06/12/2023  PCP: Nelwyn Salisbury, MD REFERRING PROVIDER: Starleen Arms, MD  END OF SESSION:  End of Session - 06/12/23 1510     Visit Number 3    Number of Visits 25    Date for SLP Re-Evaluation 08/23/23    Authorization Type [employer]    SLP Start Time 1403    SLP Stop Time  1445    SLP Time Calculation (min) 42 min    Activity Tolerance Patient tolerated treatment well              Past Medical History:  Diagnosis Date   CAD (coronary artery disease)    Constipation    Diabetes mellitus without complication (HCC)    borderline/ pre diabetic   NSTEMI (non-ST elevated myocardial infarction) (HCC)    at [place]   Prostate cancer (HCC)    Stroke (HCC)    L side - 40 years ago   Past Surgical History:  Procedure Laterality Date   CARDIAC CATHETERIZATION     CATARACT EXTRACTION Bilateral    05/2021; 07/2021   CORONARY ARTERY BYPASS GRAFT     at [facility]   Patient Active Problem List   Diagnosis Date Noted   Type 2 diabetes mellitus with diabetic neuropathy, unspecified (HCC) 06/01/2023   Chronic systolic CHF (congestive heart failure), NYHA class 2 (HCC) 06/01/2023   Left femoral vein DVT (HCC) 05/22/2023   Abnormal echocardiogram 05/22/2023   DNR (do not resuscitate) 05/22/2023   Acute CVA (cerebrovascular accident) (HCC) 05/21/2023   Current chronic use of inhaled steroid 05/21/2023   Prostate cancer (HCC) 10/14/2022   Constipation 10/14/2022   Depression 10/14/2022   Elevated PSA, greater than or equal to 20 ng/ml 07/14/2022   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   Neuropathic pain of hand, right 10/21/2021   Syncope 10/07/2021   Stroke (HCC) 10/07/2021   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 (HCC)    Acute ischemic stroke (HCC) 10/06/2021   CAD (coronary artery disease) 10/06/2021   PVD  (peripheral vascular disease) (HCC) 04/15/2019   Dyslipidemia, goal LDL below 70 04/15/2019   History of CVA with residual deficit 04/15/2019   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    ONSET DATE: 05/14/23   REFERRING DIAG: I63.9 (ICD-10-CM) - Cerebral infarction (HCC)  THERAPY DIAG:  Aphasia  Cognitive communication deficit  Rationale for Evaluation and Treatment: Rehabilitation  SUBJECTIVE:   SUBJECTIVE STATEMENT: "it was normal" re: weekend Pt accompanied by: significant other  PERTINENT HISTORY: [name] [name] is a 78 yo male presenting to ED 2/10 with confusion and dysarthria. MRI Brain shows acute infarct in the L middle frontal gyrus as well as remote pontine infarcts.   PAIN: Are you having pain? No                                                                                                   TREATMENT DATE:  06/12/23: Endorsed ongoing word finding difficulty. Exhibited adequate ability to converse  in conversation with some prompting required to clarify vague language. Conducted ongoing trials of VNeST. Pt wrote answers with usual assistance required for spelling and error awareness. Appropriately named 2/3 who & what components. Usual prompting required to ID errors for final who/what components. Able to correct response with mod questioning cues. Answered when/what/why with rare min A. SLP assisted intermittently with syntax components. Read sentences with 75% accuracy, as pt stated unrelated response with initial starter. Self-corrected with prompt.   06/07/23: Targeted word finding naming 15 items re: car parts with occasional min A. Targeted auditory comprehension generating 8 strategies to support auditory comprehension. Spouse reports [name] [name] get frustrated and snaps at her when she is talking too much. Education re: allowing more time to process and extra time to answer questions as well as saying 1 direction at a time. See Patient Instructions for other strategies. Modeled slow rate,  pausing and allowing extra time to respond for spouse. Pt endorsed this is helpful. In conversation re: his prior profession, [name] [name] benefits from limiting interruptions when he is conversing, and allowing for extended pauses for word finding without communication partner "jumping in." Educated and modeled for spouse.  To target word finding, grammar and complex sentence Audiological scientist (VNeST) was utilized. The pt generated 3 subjects and objects for 1 verb (drive), for a total of 3 subject objects. Pt required occasional semantic and questioning cues and extended time cues. Pt generated 1 complex sentences by answering "wh" questions. Pt required occasional min verbal cues to generate complex sentences.    05/31/23 (eval): Evaluation completed, reviewed goals, aphasia homework provided    PATIENT EDUCATION: Education details: See Patient Instructions, See Today's Treatment, Compensations for aphasia and congition Person educated: Patient and Spouse Education method: Explanation, Demonstration, Verbal cues, and Handouts Education comprehension: verbalized understanding and needs further education   GOALS: Goals reviewed with patient? Yes  SHORT TERM GOALS: Target date: 06/28/23  Pt will name 15 personally relevant items in a category with rare min A Baseline: Goal status: INITIAL  2.  Pt will write biographical information self correcting errors with occasional min A Baseline:  Goal status: INITIAL  3.  Pt & caregiver will carryover 2 compensatory strategies to support auditory comprehension in conversation over 1 week Baseline:  Goal status: INITIAL  4.  Pt will generate grammatically complex mildly complex sentences in structured language task such as VNeST with occasional min A Baseline:  Goal status: INITIAL  5.  Pt will use verbal compensations for aphasia in structured naming task 4/5 opportunities with occasional min A Baseline:  Goal  status: INITIAL  6.  Pt will write names of tools and car parts, self correcting as needed, to facilitate ordering of car parts on the internet Baseline:  Goal status: INITIAL  LONG TERM GOALS: Target date: 08/23/23  Pt will self correct agrammitism in conversation 4/5 opportunities with usual min A Baseline:  Goal status: INITIAL  2.  Pt will write or respond to emails at simple sentence level self correcting aphasic errors with occasional min A Baseline:  Goal status: INITIAL  3.  Pt will use external aid/calendar to manage his appointments and schedule/to do list with occasional min A from spouse Baseline:  Goal status: INITIAL  4.  Pt will use verbal compensations for aphasia in simple conversation with occasional min A Baseline:  Goal status: INITIAL  5.  Pt will explain a personally relevant topic with cohesion to require 2 or less requests for  clarification  Baseline:  Goal status: INITIAL  ASSESSMENT:  CLINICAL IMPRESSION: Patient is a 77 y.o. male who was seen today for mild cognitive impairments and moderate aphasia. Both he and his spouse Truddie Hidden state that [name] [name] and attention are at baseline and they are not concerned about these. They both express concern re: [name] [name]'s difficulty communicating including word finding and expressing his thoughts accurately. [name] [name] reports that he is slow in getting out what he wants to say. 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 Truddie Hidden stated "he missed some words that would explain it better." Truddie Hidden has always managed the finances and she has been managing [name] [name]'s medications since 2017. Auditory comprehension impaired at moderately complex yes/no, simple and moderately complex 1-2 step instructions. He frequently asked for repetition on yes/no questions and conversational questions. Oral reading was telegraphic like his narrative. For example, he read "Dog Sleep Bed" for The dog sleeps in  the bed." In sentence generation to a picture, he stated "Girl chasing boy" instead of The girl is chasing he boy. Conversational speech is empty and vague requiring me to use frequent questioning cues for clarification. Confrontation naming 5/6; divergent naming - 10 animals and 2 "m" words in 1 minute. Written expression - aphasic errors on address and short sentences (knw/know; flen/fled; don/down; [id number] (his address). He is asking his wife how to spell words in order to look up or order car parts. 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    OBJECTIVE IMPAIRMENTS: include attention, memory, executive functioning, and aphasia. These impairments are limiting patient from managing medications, managing appointments, and effectively communicating at home and in community. Factors affecting potential to achieve goals and functional outcome are previous level of function.. Patient will benefit from skilled SLP services to address above impairments and improve overall function.  REHAB POTENTIAL: Good  PLAN:  SLP FREQUENCY: 2x/week  SLP DURATION: 12 weeks  PLANNED INTERVENTIONS: Language facilitation, Environmental controls, Cueing hierachy, Cognitive reorganization, and Internal/external aids    Gracy Racer, CCC-SLP 06/12/2023, 3:10 PM

## 2023-06-14 ENCOUNTER — Ambulatory Visit: Payer: Medicare PPO

## 2023-06-14 ENCOUNTER — Encounter: Payer: Self-pay | Admitting: Speech Pathology

## 2023-06-14 ENCOUNTER — Other Ambulatory Visit: Payer: Medicare PPO

## 2023-06-14 ENCOUNTER — Ambulatory Visit: Payer: Medicare PPO | Admitting: Hematology and Oncology

## 2023-06-14 ENCOUNTER — Ambulatory Visit: Payer: Medicare PPO | Admitting: Speech Pathology

## 2023-06-14 DIAGNOSIS — R41841 Cognitive communication deficit: Secondary | ICD-10-CM

## 2023-06-14 DIAGNOSIS — R4701 Aphasia: Secondary | ICD-10-CM

## 2023-06-14 NOTE — Therapy (Signed)
 OUTPATIENT SPEECH LANGUAGE PATHOLOGY TREATMENT   Patient Name: [name] [name] MRN: [id number] DOB:[birth year], 78 y.o., male Today's Date: 06/14/2023  PCP: [name] Salisbury, MD REFERRING PROVIDER: Starleen Arms, MD  END OF SESSION:  End of Session - 06/14/23 1103     Visit Number 4    Number of Visits 25    Date for SLP Re-Evaluation 08/23/23    Authorization Type [employer]    SLP Start Time 1100    SLP Stop Time  1145    SLP Time Calculation (min) 45 min    Activity Tolerance Patient tolerated treatment well              Past Medical History:  Diagnosis Date   CAD (coronary artery disease)    Constipation    Diabetes mellitus without complication (HCC)    borderline/ pre diabetic   NSTEMI (non-ST elevated myocardial infarction) (HCC)    at [place]   Prostate cancer (HCC)    Stroke (HCC)    L side - 40 years ago   Past Surgical History:  Procedure Laterality Date   CARDIAC CATHETERIZATION     CATARACT EXTRACTION Bilateral    05/2021; 07/2021   CORONARY ARTERY BYPASS GRAFT     at [facility]   Patient Active Problem List   Diagnosis Date Noted   Type 2 diabetes mellitus with diabetic neuropathy, unspecified (HCC) 06/01/2023   Chronic systolic CHF (congestive heart failure), NYHA class 2 (HCC) 06/01/2023   Left femoral vein DVT (HCC) 05/22/2023   Abnormal echocardiogram 05/22/2023   DNR (do not resuscitate) 05/22/2023   Acute CVA (cerebrovascular accident) (HCC) 05/21/2023   Current chronic use of inhaled steroid 05/21/2023   Prostate cancer (HCC) 10/14/2022   Constipation 10/14/2022   Depression 10/14/2022   Elevated PSA, greater than or equal to 20 ng/ml 07/14/2022   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   Neuropathic pain of hand, right 10/21/2021   Syncope 10/07/2021   Stroke (HCC) 10/07/2021   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 (HCC)    Acute ischemic stroke (HCC) 10/06/2021   CAD (coronary artery disease) 10/06/2021   PVD  (peripheral vascular disease) (HCC) 04/15/2019   Dyslipidemia, goal LDL below 70 04/15/2019   History of CVA with residual deficit 04/15/2019   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    ONSET DATE: 05/14/23   REFERRING DIAG: I63.9 (ICD-10-CM) - Cerebral infarction (HCC)  THERAPY DIAG:  Aphasia  Cognitive communication deficit  Rationale for Evaluation and Treatment: Rehabilitation  SUBJECTIVE:   SUBJECTIVE STATEMENT: "I am formulating my thoughts better": Pt accompanied by: significant other [name] [name]  PERTINENT HISTORY: [name] [name] is a 78 yo male presenting to ED 2/10 with confusion and dysarthria. MRI Brain shows acute infarct in the L middle frontal gyrus as well as remote pontine infarcts.   PAIN: Are you having pain? No                                                                                                   TREATMENT DATE:   06/14/23: [name] and [name] [name] report improved communication with  friends and family. He feels he is forming his thoughts better.  To target word finding and sentence generation  Scientist, product/process development (VNeST) was utilized. The pt generated 3 subjects and objects for 3 verbs (measure, push, deliver), for a total of 9 subject objects. Pt required occasional min to mod semantic and clarification cues. Pt generated 3 complex sentences by answering "wh" questions. Pt required occasional min  cues to generate complex sentences. Targeted persuasive language generating 3 opinions re: POTUS - using visual aid with Topic on top and numbers 1-3, [name] generated 3 salient opinions and gave 1-2 reasons for each with occasional questioning cues for clarification and 2 verbal cues as well as visual to ID tangential and vague language. Targeted word finding and sentence formulation generating 4 safety improvements in racing again using visual organization cues to remain on topic with 2 clarification questions and verbal cues to ID tangential vague language   06/12/23:  Endorsed ongoing word finding difficulty. Exhibited adequate ability to converse in conversation with some prompting required to clarify vague language. Conducted ongoing trials of VNeST. Pt wrote answers with usual assistance required for spelling and error awareness. Appropriately named 2/3 who & what components. Usual prompting required to ID errors for final who/what components. Able to correct response with mod questioning cues. Answered when/what/why with rare min A. SLP assisted intermittently with syntax components. Read sentences with 75% accuracy, as pt stated unrelated response with initial starter. Self-corrected with prompt.   06/07/23: Targeted word finding naming 15 items re: car parts with occasional min A. Targeted auditory comprehension generating 8 strategies to support auditory comprehension. Spouse reports [name] [name] get frustrated and snaps at her when she is talking too much. Education re: allowing more time to process and extra time to answer questions as well as saying 1 direction at a time. See Patient Instructions for other strategies. Modeled slow rate, pausing and allowing extra time to respond for spouse. Pt endorsed this is helpful. In conversation re: his prior profession, [name] benefits from limiting interruptions when he is conversing, and allowing for extended pauses for word finding without communication partner "jumping in." Educated and modeled for spouse.  To target word finding, grammar and complex sentence Audiological scientist (VNeST) was utilized. The pt generated 3 subjects and objects for 1 verb (drive), for a total of 3 subject objects. Pt required occasional semantic and questioning cues and extended time cues. Pt generated 1 complex sentences by answering "wh" questions. Pt required occasional min verbal cues to generate complex sentences.    05/31/23 (eval): Evaluation completed, reviewed goals, aphasia homework provided    PATIENT  EDUCATION: Education details: See Patient Instructions, See Today's Treatment, Compensations for aphasia and congition Person educated: Patient and Spouse Education method: Explanation, Demonstration, Verbal cues, and Handouts Education comprehension: verbalized understanding and needs further education   GOALS: Goals reviewed with patient? Yes  SHORT TERM GOALS: Target date: 06/28/23  Pt will name 15 personally relevant items in a category with rare min A Baseline: Goal status: INITIAL  2.  Pt will write biographical information self correcting errors with occasional min A Baseline:  Goal status: INITIAL  3.  Pt & caregiver will carryover 2 compensatory strategies to support auditory comprehension in conversation over 1 week Baseline:  Goal status: INITIAL  4.  Pt will generate grammatically complex mildly complex sentences in structured language task such as VNeST with occasional min A Baseline:  Goal status: INITIAL  5.  Pt  will use verbal compensations for aphasia in structured naming task 4/5 opportunities with occasional min A Baseline:  Goal status: INITIAL  6.  Pt will write names of tools and car parts, self correcting as needed, to facilitate ordering of car parts on the internet Baseline:  Goal status: INITIAL  LONG TERM GOALS: Target date: 08/23/23  Pt will self correct agrammitism in conversation 4/5 opportunities with usual min A Baseline:  Goal status: INITIAL  2.  Pt will write or respond to emails at simple sentence level self correcting aphasic errors with occasional min A Baseline:  Goal status: INITIAL  3.  Pt will use external aid/calendar to manage his appointments and schedule/to do list with occasional min A from spouse Baseline:  Goal status: INITIAL  4.  Pt will use verbal compensations for aphasia in simple conversation with occasional min A Baseline:  Goal status: INITIAL  5.  Pt will explain a personally relevant topic with cohesion to  require 2 or less requests for clarification  Baseline:  Goal status: INITIAL  ASSESSMENT:  CLINICAL IMPRESSION: Patient is a 78 y.o. male who was seen today for mild cognitive impairments and moderate aphasia. Both he and his spouse Truddie Hidden state that [employer] and attention are at baseline and they are not concerned about these. They both express concern re: Rayen's difficulty communicating including word finding and expressing his thoughts accurately. Coe reports that he is slow in getting out what he wants to say. Lou endorsed empty, vague speech during conversation explaining his job and racing. Truddie Hidden stated "he missed some words that would explain it better." Truddie Hidden has always managed the finances and she has been managing Nello's medications since 2017. Auditory comprehension impaired at moderately complex yes/no, simple and moderately complex 1-2 step instructions. He frequently asked for repetition on yes/no questions and conversational questions. Oral reading was telegraphic like his narrative. For example, he read "Dog Sleep Bed" for The dog sleeps in the bed." In sentence generation to a picture, he stated "Girl chasing boy" instead of The girl is chasing he boy. Conversational speech is empty and vague requiring me to use frequent questioning cues for clarification. Confrontation naming 5/6; divergent naming - 10 animals and 2 "m" words in 1 minute. Written expression - aphasic errors on address and short sentences (knw/know; flen/fled; don/down; [id number] (his address). He is asking his wife how to spell words in order to look up or order car parts. 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    OBJECTIVE IMPAIRMENTS: include attention, memory, executive functioning, and aphasia. These impairments are limiting patient from  managing medications, managing appointments, and effectively communicating at home and in community. Factors affecting potential to achieve goals and functional outcome are previous level of function.. Patient will benefit from skilled SLP services to address above impairments and improve overall function.  REHAB POTENTIAL: Good  PLAN:  SLP FREQUENCY: 2x/week  SLP DURATION: 12 weeks  PLANNED INTERVENTIONS: Language facilitation, Environmental controls, Cueing hierachy, Cognitive reorganization, and Internal/external aids    Travanti Mcmanus, Radene Journey, CCC-SLP 06/14/2023, 11:56 AM

## 2023-06-19 ENCOUNTER — Ambulatory Visit: Payer: Medicare PPO | Admitting: Speech Pathology

## 2023-06-19 ENCOUNTER — Other Ambulatory Visit: Payer: Self-pay | Admitting: Physician Assistant

## 2023-06-19 DIAGNOSIS — C61 Malignant neoplasm of prostate: Secondary | ICD-10-CM

## 2023-06-20 NOTE — Progress Notes (Unsigned)
 Cardiology Office Note    Date:  06/22/2023  ID:  [name], [name] [birth year], MRN [id number] PCP:  [name] Salisbury, MD  Cardiologist:  [name] Millers, MD  Electrophysiologist:  None   Chief Complaint: Follow up for HFmrEF   History of Present Illness: .    [name] [name] is a 78 y.o. male with visit-pertinent history of CAD s/p  x3 with LIMA to LAD, SVG to OM1, SVG to RCA in [place], [place]  in 2017, CVA's (1980), PAD, prediabetes, prostate cancer.  In 09/2021 patient was admitted for CVA, during admission he reported syncopal episodes after being out working outside in the heat with disorientation and dizziness.  Neurology suspected left frontal acute infarct.  He was discharged with 3 months of DAPT.  In 11/2021 he had follow-up in the clinic and reported labile blood pressure with occasional head fullness.  He also noted some left-sided weakness and fatigue.  He was encouraged to monitor his blood pressure at home and increase physical activity as tolerated.  He was seen by Dr. Allyson [name] on 01/12/2022 and patient was doing well.  He only reported occasional shaking episodes in his left leg.  On 03/22/2022 he was seen in cardiology clinic, he reported feeling well.  He reported more physical activity.  EKG was normal sinus rhythm.  Cardiac event monitor worn after CVA showed sinus rhythm, sinus bradycardia, sinus tach and 1 brief episode of NSVT.  He was instructed to continue all current medications.  On 05/21/2023 he presented to the ED with complaints of difficulty speaking, slurred speech and confusion.  Code stroke was called in the ED.  Seen by neurology.  CT head showed new areas of cortical hypoattenuation in the left frontal operculum, he was outside window for treatment.  MRI completed shows left middle frontal gyrus acute/subacute infarction.  Echo on 05/22/2023 indicated LVEF of 45 to 50%, hypokinesis of distal anteroseptal wall/apex, G1 DD.  Lower extremity vascular ultrasound on 05/22/2023 showed  acute deep vein thrombosis involving the left common femoral vein and left proximal profunda vein.  Cardiology was consulted given mild decline in EF.  During admission he denied any anginal symptoms prior to admission or shortness of breath.  Patient had been on aspirin and Plavix prior to admission, this was continued up until underwent lower extremity Dopplers and was found to have acute DVT, transitioned to Eliquis.  On exam he did not exhibit any signs of volume overload and denied any symptoms prior to admission.  GDMT was limited in setting of acute CVA and allowing for permissive hypertension.  Patient was seen in clinic on 06/01/2023.  He reported that he was doing okay, noted that he had been overall fatigued following his stroke.  He was still having difficulty with word finding.  Patient reported a general chest soreness since his bypass surgery in 2017 that was unchanged.  He noted occasional problems with acid reflux and had recently started on Protonix.  Today he presents for follow-up.  He reports that he has been doing well.  He denies any chest pain, shortness of breath, lower extremity edema, orthopnea or PND.  He reports that he continues to work with physical therapy and has been tolerating this well.  He denies any residuals from his stroke.  Labwork independently reviewed: 06/21/2023: Sodium 140, potassium 4.3, creatinine 0.95, AST 15, ALT 11, hemoglobin 15.9, hematocrit 47.4 ROS: .   Today he denies chest pain, shortness of breath, lower extremity edema, fatigue, palpitations, melena, hematuria,  hemoptysis, diaphoresis, weakness, presyncope, syncope, orthopnea, and PND.  All other systems are reviewed and otherwise negative. Studies Reviewed: [name]    EKG:  EKG is not ordered today.  CV Studies: Cardiac studies reviewed are outlined and summarized above. Otherwise please see EMR for full report. Cardiac Studies & Procedures    ______________________________________________________________________________________________     ECHOCARDIOGRAM  ECHOCARDIOGRAM COMPLETE 05/22/2023  Narrative ECHOCARDIOGRAM REPORT    Patient Name:   [name] [name] Date of Exam: 05/22/2023 Medical Rec #:  [id number]    Height:       65.0 in Accession #:    [id number]   Weight:       170.0 lb Date of Birth:  1945/10/27    BSA:          1.846 m Patient Age:    77 years     BP:           119/73 mmHg Patient Gender: M            HR:           62 bpm. Exam Location:  Inpatient  Procedure: 2D Echo, Cardiac Doppler, Color Doppler and Intracardiac Opacification Agent  Indications:    stroke  History:        Patient has prior history of Echocardiogram examinations, most recent 10/07/2021. CAD, Prior CABG; PAD.  Sonographer:    [name] [name] RDCS (AE, PE) Referring Phys: [id number] [name] [name]   Sonographer Comments: Apical window technically limited IMPRESSIONS   1. Hypokinesis of the distal anteroseptal wall and apex with overall mild LV dyfunction. 2. Left ventricular ejection fraction, by estimation, is 45 to 50%. The left ventricle has mildly decreased function. The left ventricle demonstrates regional wall motion abnormalities (see scoring diagram/findings for description). Left ventricular diastolic parameters are consistent with Grade I diastolic dysfunction (impaired relaxation). 3. Right ventricular systolic function is normal. The right ventricular size is normal. 4. The mitral valve is normal in structure. No evidence of mitral valve regurgitation. No evidence of mitral stenosis. 5. The aortic valve is tricuspid. Aortic valve regurgitation is not visualized. Aortic valve sclerosis is present, with no evidence of aortic valve stenosis.  FINDINGS Left Ventricle: Left ventricular ejection fraction, by estimation, is 45 to 50%. The left ventricle has mildly decreased function. The left ventricle demonstrates regional wall  motion abnormalities. Definity contrast agent was given IV to delineate the left ventricular endocardial borders. The left ventricular internal cavity size was normal in size. There is no left ventricular hypertrophy. Left ventricular diastolic parameters are consistent with Grade I diastolic dysfunction (impaired relaxation).  Right Ventricle: The right ventricular size is normal. Right ventricular systolic function is normal.  Left Atrium: Left atrial size was normal in size.  Right Atrium: Right atrial size was normal in size.  Pericardium: There is no evidence of pericardial effusion.  Mitral Valve: The mitral valve is normal in structure. No evidence of mitral valve regurgitation. No evidence of mitral valve stenosis.  Tricuspid Valve: The tricuspid valve is normal in structure. Tricuspid valve regurgitation is not demonstrated. No evidence of tricuspid stenosis.  Aortic Valve: The aortic valve is tricuspid. Aortic valve regurgitation is not visualized. Aortic valve sclerosis is present, with no evidence of aortic valve stenosis.  Pulmonic Valve: The pulmonic valve was normal in structure. Pulmonic valve regurgitation is not visualized. No evidence of pulmonic stenosis.  Aorta: The aortic root is normal in size and structure.  Venous: The inferior vena cava was not well visualized.  IAS/Shunts: The interatrial septum was not well visualized.  Additional Comments: Hypokinesis of the distal anteroseptal wall and apex with overall mild LV dyfunction.   LEFT VENTRICLE PLAX 2D LVIDd:         4.30 cm      Diastology LVIDs:         3.90 cm      LV e' medial:    4.13 cm/s LV PW:         0.80 cm      LV E/e' medial:  10.4 LV IVS:        0.90 cm      LV e' lateral:   5.11 cm/s LVOT diam:     2.00 cm      LV E/e' lateral: 8.4 LV SV:         38 LV SV Index:   20 LVOT Area:     3.14 cm  LV Volumes (MOD) LV vol d, MOD A2C: 111.0 ml LV vol d, MOD A4C: 141.5 ml LV vol s, MOD A2C: 59.0  ml LV vol s, MOD A4C: 91.6 ml LV SV MOD A2C:     52.0 ml LV SV MOD A4C:     141.5 ml LV SV MOD BP:      46.3 ml  LEFT ATRIUM           Index        RIGHT ATRIUM           Index LA diam:      3.40 cm 1.84 cm/m   RA Area:     20.60 cm LA Vol (A4C): 33.9 ml 18.36 ml/m  RA Volume:   67.30 ml  36.46 ml/m AORTIC VALVE LVOT Vmax:   70.50 cm/s LVOT Vmean:  48.100 cm/s LVOT VTI:    0.120 m  AORTA Ao Root diam: 3.30 cm Ao Asc diam:  3.35 cm  MITRAL VALVE MV Area (PHT): 5.42 cm    SHUNTS MV Decel Time: 140 msec    Systemic VTI:  0.12 m MV E velocity: 42.80 cm/s  Systemic Diam: 2.00 cm MV A velocity: 66.00 cm/s MV E/A ratio:  0.65  [name] Millers MD Electronically signed by [name] Millers MD Signature Date/Time: 05/22/2023/1:52:32 PM    Final    MONITORS  CARDIAC EVENT MONITOR 11/17/2021  Narrative SR/SB/ST Short run (9 beats) of NSVT       ______________________________________________________________________________________________       Current Reported Medications:.    Current Meds  Medication Sig   abiraterone acetate (ZYTIGA) 250 MG tablet Take 4 tablets (1,000 mg total) by mouth daily. Take on an empty stomach 1 hour before or 2 hours after a meal (Patient taking differently: Take 250 mg by mouth daily. Take on an empty stomach 1 hour before or 2 hours after a meal. Pt takes 1 tablet daily)   acetaminophen (TYLENOL) 500 MG tablet Take 1 tablet (500 mg total) by mouth every 6 (six) hours as needed.   APIXABAN (ELIQUIS) VTE STARTER PACK (10MG  AND 5MG ) Take as directed on package: start with two-5mg  tablets twice daily for 7 days. On day 8, switch to one-5mg  tablet twice daily.   atorvastatin (LIPITOR) 40 MG tablet Take 1 tablet (40 mg total) by mouth daily.   calcium carbonate (TUMS EX) 750 MG chewable tablet Chew 0.5 tablets by mouth daily.   clopidogrel (PLAVIX) 75 MG tablet Take 1 tablet (75 mg total) by mouth daily.   empagliflozin (JARDIANCE) 10 MG TABS  tablet Take 1 tablet (10 mg total) by mouth daily before breakfast.   gabapentin (NEURONTIN) 100 MG capsule Take 1 capsule (100 mg total) by mouth at bedtime.   metFORMIN (GLUCOPHAGE) 1000 MG tablet Take 1 tablet (1,000 mg total) by mouth 2 (two) times daily with a meal.   pantoprazole (PROTONIX) 40 MG tablet Take 1 tablet (40 mg total) by mouth daily.   polyethylene glycol powder (GLYCOLAX/MIRALAX) 17 GM/SCOOP powder Take 17 g by mouth in the morning and at bedtime.   predniSONE (DELTASONE) 5 MG tablet Take 1 tablet (5 mg total) by mouth daily with breakfast.    Physical Exam:    VS:  BP 122/60   Pulse 76   Ht 5\' 5"  (1.651 m)   Wt 165 lb (74.8 kg)   SpO2 96%   BMI 27.46 kg/m    Wt Readings from Last 3 Encounters:  06/22/23 165 lb (74.8 kg)  06/21/23 166 lb 14.4 oz (75.7 kg)  06/05/23 167 lb 12.8 oz (76.1 kg)    GEN: Well nourished, well developed in no acute distress NECK: No JVD; No carotid bruits CARDIAC: RRR, no murmurs, rubs, gallops RESPIRATORY:  Clear to auscultation without rales, wheezing or rhonchi  ABDOMEN: Soft, non-tender, non-distended EXTREMITIES:  No edema; No acute deformity     Asessement and Plan:.    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 Today he appears euvolemic and well compensated on exam.  Previously discussed indication for PET stress given history of CABG x 3 in 2017 and reduction in EF, today patient deferred stress testing.  Through shared decision making elected to repeat echocardiogram, will reconsider further evaluation pending echocardiogram results.  Patient denies any shortness of breath, lower extremity edema, orthopnea or PND. Reviewed that he should notify the office of weight gain of 2 to 3 pounds overnight or 5 pounds in a week. Unable to escalate GDMT given normotensive pressures, per neurology should continue to avoid hypotension.   Continue Jardiance 10 mg daily. Check echocardiogram.   CAD: S/p CABG x 3 with LIMA to LAD, SVG to OM1, SVG to RCA in [place] in 2017.  Echo during admission with wall motion abnormalities however denied anginal symptoms.  Ischemic evaluation deferred given ischemic stroke. Today patient reports that he is doing well.  He denies any anginal symptoms.  Discussed ischemic evaluation, patient deferred, as noted above.  Reviewed ED precautions.  Continue Lipitor. Patient currently on Eliquis and Plavix per neurology given recent CVA.  CVA: Admitted on 05/21/2023.  MRI notable for left middle frontal gyrus acute/subacute infarction.  Patient today reports that he is doing well.  He denies any further residuals, continues to work with physical therapy.  Patient's cardiac monitor currently pending, he is wearing today at visit.  DVT: During admission found to have acute DVT of left common femoral vein and left proximal profunda vein.  On Eliquis.  Hypertension: Blood pressure today 122/60. Per neurology's notes recommend keeping blood pressure normotensive and avoiding hypotension.  Encourage patient to continue monitoring blood pressure at home and notify the office if consistently greater than 130/80.  Hyperlipidemia: Last lipid profile 05/22/2023 indicated total cholesterol 200, HDL 34, triglycerides 122 and LDL 142.  Started on Lipitor 40 mg daily.  Check fasting lipid profile and LFTs in 1 month.  DM type II: A1c 11.5, felt to likely be related to daily prednisone as  prior A1c was 6.1.  Patient was previously started on Jardiance and metformin by hospitalist.  Now being followed by his PCP.  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  He is following with oncology.    Disposition: F/u with Reather Littler, NP in six weeks.   Signed, Rip Harbour, NP

## 2023-06-21 ENCOUNTER — Inpatient Hospital Stay: Payer: Medicare PPO | Admitting: Hematology and Oncology

## 2023-06-21 ENCOUNTER — Inpatient Hospital Stay: Payer: Medicare PPO | Attending: Hematology and Oncology

## 2023-06-21 ENCOUNTER — Encounter: Payer: Self-pay | Admitting: Speech Pathology

## 2023-06-21 ENCOUNTER — Inpatient Hospital Stay: Payer: Medicare PPO

## 2023-06-21 ENCOUNTER — Ambulatory Visit: Payer: Medicare PPO | Admitting: Speech Pathology

## 2023-06-21 VITALS — BP 122/75 | HR 63 | Temp 97.6°F | Resp 17 | Wt 166.9 lb

## 2023-06-21 DIAGNOSIS — R4701 Aphasia: Secondary | ICD-10-CM

## 2023-06-21 DIAGNOSIS — Z5111 Encounter for antineoplastic chemotherapy: Secondary | ICD-10-CM | POA: Diagnosis not present

## 2023-06-21 DIAGNOSIS — R41841 Cognitive communication deficit: Secondary | ICD-10-CM

## 2023-06-21 DIAGNOSIS — C7951 Secondary malignant neoplasm of bone: Secondary | ICD-10-CM

## 2023-06-21 DIAGNOSIS — C61 Malignant neoplasm of prostate: Secondary | ICD-10-CM

## 2023-06-21 LAB — CMP (CANCER CENTER ONLY)
ALT: 11 U/L (ref 0–44)
AST: 15 U/L (ref 15–41)
Albumin: 4.5 g/dL (ref 3.5–5.0)
Alkaline Phosphatase: 65 U/L (ref 38–126)
Anion gap: 8 (ref 5–15)
BUN: 18 mg/dL (ref 8–23)
CO2: 28 mmol/L (ref 22–32)
Calcium: 9.7 mg/dL (ref 8.9–10.3)
Chloride: 104 mmol/L (ref 98–111)
Creatinine: 0.95 mg/dL (ref 0.61–1.24)
GFR, Estimated: >60 mL/min (ref >60)
Glucose, Bld: 100 mg/dL — ABNORMAL HIGH (ref 70–99)
Potassium: 4.3 mmol/L (ref 3.5–5.1)
Sodium: 140 mmol/L (ref 135–145)
Total Bilirubin: 0.6 mg/dL (ref 0.0–1.2)
Total Protein: 7.4 g/dL (ref 6.5–8.1)

## 2023-06-21 LAB — CBC WITH DIFFERENTIAL (CANCER CENTER ONLY)
Abs Immature Granulocytes: 0.02 10*3/uL (ref 0.00–0.07)
Basophils Absolute: 0 10*3/uL (ref 0.0–0.1)
Basophils Relative: 0 %
Eosinophils Absolute: 0.2 10*3/uL (ref 0.0–0.5)
Eosinophils Relative: 2 %
HCT: 47.4 % (ref 39.0–52.0)
Hemoglobin: 15.9 g/dL (ref 13.0–17.0)
Immature Granulocytes: 0 %
Lymphocytes Relative: 26 %
Lymphs Abs: 2.5 10*3/uL (ref 0.7–4.0)
MCH: 33.7 pg (ref 26.0–34.0)
MCHC: 33.5 g/dL (ref 30.0–36.0)
MCV: 100.4 fL — ABNORMAL HIGH (ref 80.0–100.0)
Monocytes Absolute: 0.5 10*3/uL (ref 0.1–1.0)
Monocytes Relative: 5 %
Neutro Abs: 6.4 10*3/uL (ref 1.7–7.7)
Neutrophils Relative %: 67 %
Platelet Count: 292 10*3/uL (ref 150–400)
RBC: 4.72 MIL/uL (ref 4.22–5.81)
RDW: 13 % (ref 11.5–15.5)
WBC Count: 9.5 10*3/uL (ref 4.0–10.5)
nRBC: 0 % (ref 0.0–0.2)

## 2023-06-21 MED ORDER — LEUPROLIDE ACETATE (3 MONTH) 22.5 MG ~~LOC~~ KIT
22.5000 mg | PACK | Freq: Once | SUBCUTANEOUS | Status: AC
  Administered 2023-06-21: 22.5 mg via SUBCUTANEOUS
  Filled 2023-06-21: qty 22.5

## 2023-06-21 NOTE — Patient Instructions (Signed)
   Write 15 auto part words - print   Try to do it yourself first  Then work with [name] to correct any errors

## 2023-06-21 NOTE — Therapy (Signed)
 OUTPATIENT SPEECH LANGUAGE PATHOLOGY TREATMENT   Patient Name: [name] [name] MRN: [id number] DOB:11/07/1945, 78 y.o., male Today's Date: 06/21/2023  PCP: Nelwyn Salisbury, MD REFERRING PROVIDER: Starleen Arms, MD  END OF SESSION:  End of Session - 06/21/23 0931     Visit Number 5    Number of Visits 25    Date for SLP Re-Evaluation 08/23/23    Authorization Type [employer]    SLP Start Time 0930    SLP Stop Time  1015    SLP Time Calculation (min) 45 min    Activity Tolerance Patient tolerated treatment well              Past Medical History:  Diagnosis Date   CAD (coronary artery disease)    Constipation    Diabetes mellitus without complication (HCC)    borderline/ pre diabetic   NSTEMI (non-ST elevated myocardial infarction) (HCC)    at [place]   Prostate cancer (HCC)    Stroke (HCC)    L side - 40 years ago   Past Surgical History:  Procedure Laterality Date   CARDIAC CATHETERIZATION     CATARACT EXTRACTION Bilateral    05/2021; 07/2021   CORONARY ARTERY BYPASS GRAFT     at [facility]   Patient Active Problem List   Diagnosis Date Noted   Type 2 diabetes mellitus with diabetic neuropathy, unspecified (HCC) 06/01/2023   Chronic systolic CHF (congestive heart failure), NYHA class 2 (HCC) 06/01/2023   Left femoral vein DVT (HCC) 05/22/2023   Abnormal echocardiogram 05/22/2023   DNR (do not resuscitate) 05/22/2023   Acute CVA (cerebrovascular accident) (HCC) 05/21/2023   Current chronic use of inhaled steroid 05/21/2023   Prostate cancer (HCC) 10/14/2022   Constipation 10/14/2022   Depression 10/14/2022   Elevated PSA, greater than or equal to 20 ng/ml 07/14/2022   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   Neuropathic pain of hand, right 10/21/2021   Syncope 10/07/2021   Stroke (HCC) 10/07/2021   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 (HCC)    Acute ischemic stroke (HCC) 10/06/2021   CAD (coronary artery disease) 10/06/2021   PVD  (peripheral vascular disease) (HCC) 04/15/2019   Dyslipidemia, goal LDL below 70 04/15/2019   History of CVA with residual deficit 04/15/2019   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    ONSET DATE: 05/14/23   REFERRING DIAG: I63.9 (ICD-10-CM) - Cerebral infarction (HCC)  THERAPY DIAG:  Aphasia  Cognitive communication deficit  Rationale for Evaluation and Treatment: Rehabilitation  SUBJECTIVE:   SUBJECTIVE STATEMENT: "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 MRI Brain shows acute infarct in the L middle frontal gyrus as well as remote pontine infarcts.   PAIN: Are you having pain? No                                                                                                   TREATMENT DATE:   06/21/23: [name] missed last session due to  not feeling well. Targeted written expression of name and  address  -Mikolaj required extended time, however he Id'd and self corrected errors with mod I. Written expression targeting car parts for ordering parts on line - Dezmin required frequent mod verbal cues to correctly write and self correct errors - he generated 5 words. Targeted verbal compensations for aphasia generating 3 similarities and differences for 3 sets of related words - Chick required frequent questioning cues to generate a total of 9 similarities and differences. Targeted verbal compensations for aphasia generating 3 descriptions/clues for a high frequency word using sematic feature analysis with usual questioning cues. HW is to write list of 15 car part words without help, then to check words with [name].  06/14/23: [name] and [name] report improved communication with friends and family. He feels he is forming his thoughts better.  To target word finding and sentence generation  Scientist, product/process development (VNeST) was utilized. The pt  generated 3 subjects and objects for 3 verbs (measure, push, deliver), for a total of 9 subject objects. Pt required occasional min to mod semantic and clarification cues. Pt generated 3 complex sentences by answering "wh" questions. Pt required occasional min  cues to generate complex sentences. Targeted persuasive language generating 3 opinions re: POTUS - using visual aid with Topic on top and numbers 1-3, Jaime generated 3 salient opinions and gave 1-2 reasons for each with occasional questioning cues for clarification and 2 verbal cues as well as visual to ID tangential and vague language. Targeted word finding and sentence formulation generating 4 safety improvements in racing again using visual organization cues to remain on topic with 2 clarification questions and verbal cues to ID tangential vague language   06/12/23: Endorsed ongoing word finding difficulty. Exhibited adequate ability to converse in conversation with some prompting required to clarify vague language. Conducted ongoing trials of VNeST. Pt wrote answers with usual assistance required for spelling and error awareness. Appropriately named 2/3 who & what components. Usual prompting required to ID errors for final who/what components. Able to correct response with mod questioning cues. Answered when/what/why with rare min A. SLP assisted intermittently with syntax components. Read sentences with 75% accuracy, as pt stated unrelated response with initial starter. Self-corrected with prompt.   06/07/23: Targeted word finding naming 15 items re: car parts with occasional min A. Targeted auditory comprehension generating 8 strategies to support auditory comprehension. Spouse reports Brave get frustrated and snaps at her when she is talking too much. Education re: allowing more time to process and extra time to answer questions as well as saying 1 direction at a time. See Patient Instructions for other strategies. Modeled slow rate, pausing and  allowing extra time to respond for spouse. Pt endorsed this is helpful. In conversation re: his prior profession, Laiken benefits from limiting interruptions when he is conversing, and allowing for extended pauses for word finding without communication partner "jumping in." Educated and modeled for spouse.  To target word finding, grammar and complex sentence Audiological scientist (VNeST) was utilized. The pt generated 3 subjects and objects for 1 verb (drive), for a total of 3 subject objects. Pt required occasional semantic and questioning cues and extended time cues. Pt generated 1 complex sentences by answering "wh" questions. Pt required occasional min verbal cues to generate complex sentences.    05/31/23 (eval): Evaluation completed, reviewed goals, aphasia homework provided    PATIENT EDUCATION: Education details: See Patient Instructions, See Today's Treatment, Compensations for  aphasia and congition Person educated: Patient and Spouse Education method: Explanation, Demonstration, Verbal cues, and Handouts Education comprehension: verbalized understanding and needs further education   GOALS: Goals reviewed with patient? Yes  SHORT TERM GOALS: Target date: 06/28/23  Pt will name 15 personally relevant items in a category with rare min A Baseline: Goal status:ONGOING  2.  Pt will write biographical information self correcting errors with occasional min A Baseline:  Goal status: ONGOING  3.  Pt & caregiver will carryover 2 compensatory strategies to support auditory comprehension in conversation over 1 week Baseline:  Goal status: ONGOING  4.  Pt will generate grammatically complex mildly complex sentences in structured language task such as VNeST with occasional min A Baseline:  Goal status: ONGOING 5.  Pt will use verbal compensations for aphasia in structured naming task 4/5 opportunities with occasional min A Baseline:  Goal status:  ONGOING  6.  Pt will write names of tools and car parts, self correcting as needed, to facilitate ordering of car parts on the internet Baseline:  Goal status: INITIAL  LONG TERM GOALS: Target date: 08/23/23  Pt will self correct agrammitism in conversation 4/5 opportunities with usual min A Baseline:  Goal status: ONGOING  2.  Pt will write or respond to emails at simple sentence level self correcting aphasic errors with occasional min A Baseline:  Goal status: ONGOING  3.  Pt will use external aid/calendar to manage his appointments and schedule/to do list with occasional min A from spouse Baseline:  Goal status: ONGOING  4.  Pt will use verbal compensations for aphasia in simple conversation with occasional min A Baseline:  Goal status: ONGOING  5.  Pt will explain a personally relevant topic with cohesion to require 2 or less requests for clarification  Baseline:  Goal status: ONGOING  ASSESSMENT:  CLINICAL IMPRESSION: Patient is a 78 y.o. male who was seen today for mild cognitive impairments and moderate aphasia. Oran continues to make progress improving word finding and reducing agrammatism in narrative discourse. He reports improved confidence communicating. He continues to required visual cues to maintain topic and reduce tangential language. He continues to required frequent mod A to use verbal compensations for aphasia and for writing at word level. [name] is still telling him how to spell car parts for on line ordering.  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    OBJECTIVE IMPAIRMENTS: include attention, memory, executive functioning, and aphasia. These impairments are limiting patient from managing medications, managing appointments, and effectively communicating at home and in community. Factors affecting potential to achieve goals and functional outcome are  previous level of function.. Patient will benefit from skilled SLP services to address above impairments and improve overall function.  REHAB POTENTIAL: Good  PLAN:  SLP FREQUENCY: 2x/week  SLP DURATION: 12 weeks  PLANNED INTERVENTIONS: Language facilitation, Environmental controls, Cueing hierachy, Cognitive reorganization, and Internal/external aids    Ibrohim Simmers, Radene Journey, CCC-SLP 06/21/2023, 12:00 PM

## 2023-06-21 NOTE — Progress Notes (Signed)
 [facility] [phone]   [phone]  PROGRESS NOTE  Patient Care Team: Nelwyn Salisbury, MD as PCP - General (Family Medicine) Jens Som Madolyn Frieze, MD as PCP - Cardiology (Cardiology) Drema Dallas, DO as Consulting Physician (Neurology) Cherlyn Cushing, RN as Oncology Nurse Navigator  Hematological/Oncological History # Metastatic Prostate Cancer  06/21/2022: PSA 87.3 07/20/2022: RP lymphadenopathy, nodular prostate and 2 pulmonary nodules.  09/26/2022: TRUS biopsy showed adenocarcinoma of the prostate, Gleason Score 5+4 =9  10/27/2022: NM PSMA scan showed metastatic spread to lymph nodes/bones  10/2022: started Eligard 45 mg subcutaneous and Abiraterone 1000 mg PO daily with prednisone 5 mg PO daily.  11/16/2022: establish care with Dr. Leonides Schanz  05/16/2023: Transitioned to Zytiga 250 mg p.o. daily with prednisone 5 mg p.o. daily.  Interval History:  [name] [name] 78 y.o. male with medical history significant for metastatic castrate sensitive prostate cancer who presents for a follow up visit. The patient's last visit was on 05/16/2023. In the interim since the last visit he is continued on Zytiga therapy as prescribed.  On exam today, [name] [name] is accompanied by his wife.  He reports he has no residual deficits after his CVA.  He reports he is taking his Zytiga 250 mg p.o as prescribed.  He reports that he is not having any pain other than some occasional neuropathy burning of his lower extremities.  He reports on occasion he does feel somewhat woozy.  He reports his bowel movements have been thinner than usual, reporting that they are of normal color with no straining or pain or blood, but appear "string-like".  He reports that when he passes a bowel movement he feels considerably better.  He reports that he does have some hot flashes and sweats with this therapy but no itching, redness, or.  Overall he is willing and able to continue on his Zytiga therapy at this time.  Otherwise he is not having any major side effects as a result of his treatment and is willing and able to proceed at this time. He denies fevers, chills, sweats, shortness of breath, chest pain, cough, nausea, vomiting or diarrhea. He has no other complaints. Full 10 point ROS is otherwise negative.  MEDICAL HISTORY:  Past Medical History:  Diagnosis Date   CAD (coronary artery disease)    Constipation    Diabetes mellitus without complication (HCC)    borderline/ pre diabetic   NSTEMI (non-ST elevated myocardial infarction) (HCC)    at [place]   Prostate cancer (HCC)    Stroke (HCC)    L side - 40 years ago    SURGICAL HISTORY: Past Surgical History:  Procedure Laterality Date   CARDIAC CATHETERIZATION     CATARACT EXTRACTION Bilateral    05/2021; 07/2021   CORONARY ARTERY BYPASS GRAFT     at [facility]    SOCIAL HISTORY: Social History   Socioeconomic History   Marital status: Married    Spouse name: Not on file   Number of children: 2   Years of education: Not on file   Highest education level: Master's degree (e.g., MA, MS, MEng, MEd, MSW, MBA)  Occupational History   Occupation: retired  Tobacco Use   Smoking status: Former    Current packs/day: 0.00    Types: Cigarettes    Quit date: 04/11/1968    Years since quitting: 55.2   Smokeless tobacco: Never   Tobacco comments:    quit in the 1970s  Vaping Use   Vaping status:  Never Used  Substance and Sexual Activity   Alcohol use: Not Currently    Comment: Rare   Drug use: No   Sexual activity: Not on file  Other Topics Concern   Not on file  Social History Narrative   Right handed   One story home   Drinks no caffeine   Social Drivers of Health   Financial Resource Strain: Low Risk  (10/11/2022)   Overall Financial Resource Strain (CARDIA)    Difficulty of Paying Living Expenses: Not hard at all  Food Insecurity: Patient Declined (05/22/2023)   Hunger Vital Sign    Worried About Running Out of Food in the  Last Year: Patient declined    Ran Out of Food in the Last Year: Patient declined  Transportation Needs: Patient Declined (05/22/2023)   PRAPARE - Administrator, Civil Service (Medical): Patient declined    Lack of Transportation (Non-Medical): Patient declined  Physical Activity: Unknown (10/11/2022)   Exercise Vital Sign    Days of Exercise per Week: 5 days    Minutes of Exercise per Session: Patient declined  Stress: No Stress Concern Present (10/11/2022)   [employer] of Occupational Health - Occupational Stress Questionnaire    Feeling of Stress : Only a little  Social Connections: Patient Declined (05/22/2023)   Social Connection and Isolation Panel [NHANES]    Frequency of Communication with Friends and Family: Patient declined    Frequency of Social Gatherings with Friends and Family: Patient declined    Attends Religious Services: Patient declined    Database administrator or Organizations: Patient declined    Attends Banker Meetings: Patient declined    Marital Status: Patient declined  Intimate Partner Violence: Patient Declined (05/22/2023)   Humiliation, Afraid, Rape, and Kick questionnaire    Fear of Current or Ex-Partner: Patient declined    Emotionally Abused: Patient declined    Physically Abused: Patient declined    Sexually Abused: Patient declined    FAMILY HISTORY: Family History  Problem Relation Age of Onset   Dementia Mother        lived to 101 years   Liver disease Father    Diabetes Sister    Cirrhosis Brother    Cancer - Colon Neg Hx    Colon polyps Neg Hx    Rectal cancer Neg Hx    Esophageal cancer Neg Hx    Stomach cancer Neg Hx    Pancreatic cancer Neg Hx     ALLERGIES:  has no known allergies.  MEDICATIONS:  Current Outpatient Medications  Medication Sig Dispense Refill   abiraterone acetate (ZYTIGA) 250 MG tablet Take 4 tablets (1,000 mg total) by mouth daily. Take on an empty stomach 1 hour before or 2 hours  after a meal (Patient taking differently: Take 250 mg by mouth daily. Take on an empty stomach 1 hour before or 2 hours after a meal) 4 tablet 0   acetaminophen (TYLENOL) 500 MG tablet Take 1 tablet (500 mg total) by mouth every 6 (six) hours as needed. 30 tablet 0   APIXABAN (ELIQUIS) VTE STARTER PACK (10MG  AND 5MG ) Take as directed on package: start with two-5mg  tablets twice daily for 7 days. On day 8, switch to one-5mg  tablet twice daily. 74 each 0   atorvastatin (LIPITOR) 40 MG tablet Take 1 tablet (40 mg total) by mouth daily. 30 tablet 0   calcium carbonate (TUMS EX) 750 MG chewable tablet Chew 0.5 tablets by mouth daily.  clopidogrel (PLAVIX) 75 MG tablet Take 1 tablet (75 mg total) by mouth daily. 30 tablet 0   empagliflozin (JARDIANCE) 10 MG TABS tablet Take 1 tablet (10 mg total) by mouth daily before breakfast. 30 tablet 0   gabapentin (NEURONTIN) 100 MG capsule Take 1 capsule (100 mg total) by mouth at bedtime. 30 capsule 5   metFORMIN (GLUCOPHAGE) 1000 MG tablet Take 1 tablet (1,000 mg total) by mouth 2 (two) times daily with a meal. 60 tablet 5   pantoprazole (PROTONIX) 40 MG tablet Take 1 tablet (40 mg total) by mouth daily. 90 tablet 0   polyethylene glycol powder (GLYCOLAX/MIRALAX) 17 GM/SCOOP powder Take 17 g by mouth in the morning and at bedtime. 17 g 0   predniSONE (DELTASONE) 5 MG tablet Take 1 tablet (5 mg total) by mouth daily with breakfast. 1 tablet 0   No current facility-administered medications for this visit.    REVIEW OF SYSTEMS:   Constitutional: ( - ) fevers, ( - )  chills , ( - ) night sweats Eyes: ( - ) blurriness of vision, ( - ) double vision, ( - ) watery eyes Ears, nose, mouth, throat, and face: ( - ) mucositis, ( - ) sore throat Respiratory: ( - ) cough, ( - ) dyspnea, ( - ) wheezes Cardiovascular: ( - ) palpitation, ( - ) chest discomfort, ( - ) lower extremity swelling Gastrointestinal:  ( - ) nausea, ( - ) heartburn, ( - ) change in bowel  habits Skin: ( - ) abnormal skin rashes Lymphatics: ( - ) new lymphadenopathy, ( - ) easy bruising Neurological: ( - ) numbness, ( - ) tingling, ( - ) new weaknesses Behavioral/Psych: ( - ) mood change, ( - ) new changes  All other systems were reviewed with the patient and are negative.  PHYSICAL EXAMINATION:  Vitals:   06/21/23 1301  BP: 122/75  Pulse: 63  Resp: 17  Temp: 97.6 F (36.4 C)  SpO2: 100%   Filed Weights   06/21/23 1301  Weight: 166 lb 14.4 oz (75.7 kg)    GENERAL: Well-appearing elderly African-American male, alert, no distress and comfortable SKIN: skin color, texture, turgor are normal, no rashes or significant lesions EYES: conjunctiva are pink and non-injected, sclera clear LUNGS: clear to auscultation and percussion with normal breathing effort HEART: regular rate & rhythm and no murmurs and no lower extremity edema Musculoskeletal: no cyanosis of digits and no clubbing  PSYCH: alert & oriented x 3, fluent speech NEURO: no focal motor/sensory deficits  LABORATORY DATA:  I have reviewed the data as listed    Latest Ref Rng & Units 06/21/2023   12:40 PM 05/22/2023   10:24 AM 05/21/2023    5:06 PM  CBC  WBC 4.0 - 10.5 K/uL 9.5  9.9    Hemoglobin 13.0 - 17.0 g/dL 81.1  91.4  78.2   Hematocrit 39.0 - 52.0 % 47.4  41.5  42.0   Platelets 150 - 400 K/uL 292  308         Latest Ref Rng & Units 06/21/2023   12:40 PM 05/22/2023   10:24 AM 05/21/2023    5:06 PM  CMP  Glucose 70 - 99 mg/dL 956  213  086   BUN 8 - 23 mg/dL 18  11  13    Creatinine 0.61 - 1.24 mg/dL 5.78  4.69  6.29   Sodium 135 - 145 mmol/L 140  139  140   Potassium 3.5 - 5.1  mmol/L 4.3  3.8  3.7   Chloride 98 - 111 mmol/L 104  106  105   CO2 22 - 32 mmol/L 28  22    Calcium 8.9 - 10.3 mg/dL 9.7  9.0    Total Protein 6.5 - 8.1 g/dL 7.4     Total Bilirubin 0.0 - 1.2 mg/dL 0.6     Alkaline Phos 38 - 126 U/L 65     AST 15 - 41 U/L 15     ALT 0 - 44 U/L 11      RADIOGRAPHIC STUDIES: No  results found.  ASSESSMENT & PLAN [name] [name] 78 y.o. male with medical history significant for metastatic castrate sensitive prostate cancer who presents for a follow up visit.   After review of the labs, review of the records, and discussion with the patient the patients findings are most consistent with metastatic adenocarcinoma of the prostate, castrate sensitive.   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 -- Patient started Lupron and Zytiga 1000 mg p.o. daily with 5 mg prednisone in late July 2024. -- Diagnosis confirmed with prostate biopsy Gleason 9 with PSMA scan showing widespread metastasis to lymph nodes and bones. PLAN:  --Labs today reviewed. WBC 9.5, hemoglobin 15.9, MCV 100.4, platelets 292, creatinine and LFTs normal.  --Last PSA level shows improvement to <0.1 (previously 1.1). Today's PSA is pending.  -- Continue Zytiga 250 mg p.o. daily with prednisone 5 mg.   -- RTC in 3 months with labs, follow up visit, Eligard injection.    No orders of the defined types were placed in this encounter.   All questions were answered. The patient knows to call the clinic with any problems, questions or concerns.  A total of more than 30 minutes were spent on this encounter with face-to-face time and non-face-to-face time, including preparing to see the patient, ordering tests and/or medications, counseling the patient and coordination of care as outlined above.   Ulysees Barns, MD Department of Hematology/Oncology [facility] Phone: [phone] Pager: [phone] Email: [email or web address]  06/21/2023 5:14 PM

## 2023-06-22 ENCOUNTER — Ambulatory Visit: Payer: Medicare PPO | Attending: Cardiology | Admitting: Cardiology

## 2023-06-22 ENCOUNTER — Encounter: Payer: Self-pay | Admitting: Cardiology

## 2023-06-22 ENCOUNTER — Telehealth: Payer: Self-pay | Admitting: *Deleted

## 2023-06-22 VITALS — BP 122/60 | HR 76 | Ht 65.0 in | Wt 165.0 lb

## 2023-06-22 DIAGNOSIS — I10 Essential (primary) hypertension: Secondary | ICD-10-CM

## 2023-06-22 DIAGNOSIS — E782 Mixed hyperlipidemia: Secondary | ICD-10-CM

## 2023-06-22 DIAGNOSIS — E1140 Type 2 diabetes mellitus with diabetic neuropathy, unspecified: Secondary | ICD-10-CM | POA: Diagnosis not present

## 2023-06-22 DIAGNOSIS — I5022 Chronic systolic (congestive) heart failure: Secondary | ICD-10-CM

## 2023-06-22 DIAGNOSIS — I639 Cerebral infarction, unspecified: Secondary | ICD-10-CM

## 2023-06-22 DIAGNOSIS — I2510 Atherosclerotic heart disease of native coronary artery without angina pectoris: Secondary | ICD-10-CM

## 2023-06-22 LAB — TESTOSTERONE: Testosterone: <3 ng/dL — ABNORMAL LOW (ref 264–916)

## 2023-06-22 LAB — PROSTATE-SPECIFIC AG, SERUM (LABCORP): Prostate Specific Ag, Serum: <0.1 ng/mL (ref 0.0–4.0)

## 2023-06-22 NOTE — Telephone Encounter (Signed)
 TCT patient regarding recent lab results. Spoke with pt's wife.  Advised that his PSA and testosterone remain undetectable. His Zytiga appears to be working well. We will see him back in 3 months for continued monitoring and lupron shots.  She voiced understanding. She is aware that [name] [name] will continue on 1 tablet daily of his Zytiga.

## 2023-06-22 NOTE — Telephone Encounter (Signed)
-----   Message from [name] [name] sent at 06/22/2023  9:30 AM EDT ----- Please let Mr. Chizek know that his PSA and testosterone remain undetectable. His Zytiga appears to be working well. We will see him back in 3 months for continued monitoring and lupron shots. ----- Message ----- From: Raymondo Band Sent: 06/22/2023   9:22 AM EDT To: Jaci Standard, MD  PSA still undetectable. ----- Message ----- From: Leory Plowman, Lab In [place] Sent: 06/21/2023  12:58 PM EDT To: Briant Cedar, PA-C

## 2023-06-22 NOTE — Patient Instructions (Signed)
 Medication Instructions:  No changes *If you need a refill on your cardiac medications before your next appointment, please call your pharmacy*  Lab Work: Fasting lipid and LFT in 1 month If you have labs (blood work) drawn today and your tests are completely normal, you will receive your results only by: MyChart Message (if you have MyChart) OR A paper copy in the mail If you have any lab test that is abnormal or we need to change your treatment, we will call you to review the results.  Testing/Procedures: Your physician has requested that you have an echocardiogram. Echocardiography is a painless test that uses sound waves to create images of your heart. It provides your doctor with information about the size and shape of your heart and how well your heart's chambers and valves are working. This procedure takes approximately one hour. There are no restrictions for this procedure. Please do NOT wear cologne, perfume, aftershave, or lotions (deodorant is allowed). Please arrive 15 minutes prior to your appointment time.  Please note: We ask at that you not bring children with you during ultrasound (echo/ vascular) testing. Due to room size and safety concerns, children are not allowed in the ultrasound rooms during exams. Our front office staff cannot provide observation of children in our lobby area while testing is being conducted. An adult accompanying a patient to their appointment will only be allowed in the ultrasound room at the discretion of the ultrasound technician under special circumstances. We apologize for any inconvenience.  Follow-Up: At [facility], you and your health needs are our priority.  As part of our continuing mission to provide you with exceptional heart care, we have created designated Provider Care Teams.  These Care Teams include your primary Cardiologist (physician) and Advanced Practice Providers (APPs -  Physician Assistants and Nurse Practitioners) who  all work together to provide you with the care you need, when you need it.  We recommend signing up for the patient portal called "MyChart".  Sign up information is provided on this After Visit Summary.  MyChart is used to connect with patients for Virtual Visits (Telemedicine).  Patients are able to view lab/test results, encounter notes, upcoming appointments, etc.  Non-urgent messages can be sent to your provider as well.   To learn more about what you can do with MyChart, go to [email or web address].    Your next appointment:   6 week(s)  Provider:   Reather Littler, NP     Other Instructions

## 2023-06-26 ENCOUNTER — Ambulatory Visit: Admitting: Speech Pathology

## 2023-06-26 ENCOUNTER — Encounter: Payer: Self-pay | Admitting: Speech Pathology

## 2023-06-26 DIAGNOSIS — R4701 Aphasia: Secondary | ICD-10-CM

## 2023-06-26 DIAGNOSIS — R41841 Cognitive communication deficit: Secondary | ICD-10-CM | POA: Diagnosis not present

## 2023-06-26 NOTE — Therapy (Signed)
 OUTPATIENT SPEECH LANGUAGE PATHOLOGY TREATMENT   Patient Name: [name] [name] MRN: [id number] DOB:1945/07/26, 78 y.o., male Today's Date: 06/26/2023  PCP: [name] Salisbury, MD REFERRING PROVIDER: Starleen Arms, MD  END OF SESSION:  End of Session - 06/26/23 1314     Visit Number 6    Number of Visits 25    Date for SLP Re-Evaluation 08/23/23    Authorization Type [employer]    SLP Start Time 1315    SLP Stop Time  1400    SLP Time Calculation (min) 45 min    Activity Tolerance Patient tolerated treatment well              Past Medical History:  Diagnosis Date   CAD (coronary artery disease)    Constipation    Diabetes mellitus without complication (HCC)    borderline/ pre diabetic   NSTEMI (non-ST elevated myocardial infarction) (HCC)    at [place]   Prostate cancer (HCC)    Stroke (HCC)    L side - 40 years ago   Past Surgical History:  Procedure Laterality Date   CARDIAC CATHETERIZATION     CATARACT EXTRACTION Bilateral    05/2021; 07/2021   CORONARY ARTERY BYPASS GRAFT     at [facility]   Patient Active Problem List   Diagnosis Date Noted   Type 2 diabetes mellitus with diabetic neuropathy, unspecified (HCC) 06/01/2023   Chronic systolic CHF (congestive heart failure), NYHA class 2 (HCC) 06/01/2023   Left femoral vein DVT (HCC) 05/22/2023   Abnormal echocardiogram 05/22/2023   DNR (do not resuscitate) 05/22/2023   Acute CVA (cerebrovascular accident) (HCC) 05/21/2023   Current chronic use of inhaled steroid 05/21/2023   Prostate cancer (HCC) 10/14/2022   Constipation 10/14/2022   Depression 10/14/2022   Elevated PSA, greater than or equal to 20 ng/ml 07/14/2022   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   Neuropathic pain of hand, right 10/21/2021   Syncope 10/07/2021   Stroke (HCC) 10/07/2021   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 (HCC)    Acute ischemic stroke (HCC) 10/06/2021   CAD (coronary artery disease) 10/06/2021   PVD  (peripheral vascular disease) (HCC) 04/15/2019   Dyslipidemia, goal LDL below 70 04/15/2019   History of CVA with residual deficit 04/15/2019   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 MRI Brain shows acute infarct in the L middle frontal gyrus as well as remote pontine infarcts.   PAIN: Are you having pain? No                                                                                                   TREATMENT DATE:   06/26/23: Targeted sentence generation generating 2-3 sentences  with words that have multiple meanings - With min extended time, [name] [name] generated 10 sentences with accurate grammar with rare min A. Targeted written expression at word level in simple divergent naming/writing (fruit) - [name] [name] named and wrote 8 fruits with usual mod A to ID and correct errors. He benefits from cues to spell aloud prior to writing. Even with copy cues, [name] [name] occasionally required multiple cues to ID that he copied wrong. In conversation targeting cohesive narrative, he benefits from visual cues of topic written down and his ideas/points written down to maintain topic.   3/12/25[name] missed last session due to not feeling well. Targeted written expression of name and  address  -[name] [name] required extended time, however he Id'd and self corrected errors with mod I. Written expression targeting car parts for ordering parts on line - [name] [name] required frequent mod verbal cues to correctly write and self correct errors - he generated 5 words. Targeted verbal compensations for aphasia  generating 3 similarities and differences for 3 sets of related words - [name] [name] required frequent questioning cues to generate a total of 9 similarities and differences. Targeted verbal compensations for aphasia generating 3 descriptions/clues for a high frequency word using sematic feature analysis with usual questioning cues. HW is to write list of 15 car part words without help, then to check words with [name] [name].  06/14/23: [name] [name] and [name] [name] report improved communication with friends and family. He feels he is forming his thoughts better.  To target word finding and sentence generation  Scientist, product/process development (VNeST) was utilized. The pt generated 3 subjects and objects for 3 verbs (measure, push, deliver), for a total of 9 subject objects. Pt required occasional min to mod semantic and clarification cues. Pt generated 3 complex sentences by answering "wh" questions. Pt required occasional min  cues to generate complex sentences. Targeted persuasive language generating 3 opinions re: [name] [name] - using visual aid with Topic on top and numbers 1-3, [name] [name] generated 3 salient opinions and gave 1-2 reasons for each with occasional questioning cues for clarification and 2 verbal cues as well as visual to ID tangential and vague language. Targeted word finding and sentence formulation generating 4 safety improvements in racing again using visual organization cues to remain on topic with 2 clarification questions and verbal cues to ID tangential vague language   06/12/23: Endorsed ongoing word finding difficulty. Exhibited adequate ability to converse in conversation with some prompting required to clarify vague language. Conducted ongoing trials of VNeST. Pt wrote answers with usual assistance required for spelling and error awareness. Appropriately named 2/3 who & what components. Usual prompting required to ID errors for final who/what components. Able to correct response with mod questioning cues. Answered  when/what/why with rare min A. SLP assisted intermittently with syntax components. Read sentences with 75% accuracy, as pt stated unrelated response with initial starter. Self-corrected with prompt.   06/07/23: Targeted word finding naming 15 items re: car parts with occasional min A. Targeted auditory comprehension generating 8 strategies to support auditory comprehension. Spouse reports Cleaven get frustrated and snaps at her when she is talking too much. Education re: allowing more time to process and extra time to answer questions as well as saying 1 direction at a time. See Patient Instructions for other strategies. Modeled slow rate, pausing and allowing extra time to respond for spouse. Pt endorsed this is helpful. In conversation re: his prior profession, Azai benefits from limiting interruptions when he is conversing, and allowing for extended pauses for  word finding without communication partner "jumping in." Educated and modeled for spouse.  To target word finding, grammar and complex sentence Audiological scientist (VNeST) was utilized. The pt generated 3 subjects and objects for 1 verb (drive), for a total of 3 subject objects. Pt required occasional semantic and questioning cues and extended time cues. Pt generated 1 complex sentences by answering "wh" questions. Pt required occasional min verbal cues to generate complex sentences.    05/31/23 (eval): Evaluation completed, reviewed goals, aphasia homework provided    PATIENT EDUCATION: Education details: See Patient Instructions, See Today's Treatment, Compensations for aphasia and congition Person educated: Patient and Spouse Education method: Explanation, Demonstration, Verbal cues, and Handouts Education comprehension: verbalized understanding and needs further education   GOALS: Goals reviewed with patient? Yes  SHORT TERM GOALS: Target date: 06/28/23  Pt will name 15 personally relevant items in a  category with rare min A Baseline: Goal status:ONGOING  2.  Pt will write biographical information self correcting errors with occasional min A Baseline:  Goal status: ONGOING  3.  Pt & caregiver will carryover 2 compensatory strategies to support auditory comprehension in conversation over 1 week Baseline:  Goal status: ONGOING  4.  Pt will generate grammatically complex mildly complex sentences in structured language task such as VNeST with occasional min A Baseline:  Goal status: ONGOING 5.  Pt will use verbal compensations for aphasia in structured naming task 4/5 opportunities with occasional min A Baseline:  Goal status: ONGOING  6.  Pt will write names of tools and car parts, self correcting as needed, to facilitate ordering of car parts on the internet Baseline:  Goal status: INITIAL  LONG TERM GOALS: Target date: 08/23/23  Pt will self correct agrammitism in conversation 4/5 opportunities with usual min A Baseline:  Goal status: ONGOING  2.  Pt will write or respond to emails at simple sentence level self correcting aphasic errors with occasional min A Baseline:  Goal status: ONGOING  3.  Pt will use external aid/calendar to manage his appointments and schedule/to do list with occasional min A from spouse Baseline:  Goal status: ONGOING  4.  Pt will use verbal compensations for aphasia in simple conversation with occasional min A Baseline:  Goal status: ONGOING  5.  Pt will explain a personally relevant topic with cohesion to require 2 or less requests for clarification  Baseline:  Goal status: ONGOING  ASSESSMENT:  CLINICAL IMPRESSION: Patient is a 78 y.o. male who was seen today for mild cognitive impairments and moderate aphasia. Sanay continues to make progress improving word finding and reducing agrammatism in narrative discourse. He reports improved confidence communicating. He continues to required visual cues to maintain topic and reduce tangential  language. He continues to required frequent mod A to use verbal compensations for aphasia and for writing at word level. [name] [name] is still telling him how to spell car parts for on line ordering.  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    OBJECTIVE IMPAIRMENTS: include attention, memory, executive functioning, and aphasia. These impairments are limiting patient from managing medications, managing appointments, and effectively communicating at home and in community. Factors affecting potential to achieve goals and functional outcome are previous level of function.. Patient will benefit from skilled SLP services to address above impairments and improve overall function.  REHAB POTENTIAL: Good  PLAN:  SLP FREQUENCY: 2x/week  SLP DURATION: 12 weeks  PLANNED INTERVENTIONS: Language facilitation, Environmental controls, Cueing hierachy, Cognitive reorganization, and Internal/external aids    Tehya Leath, Radene Journey, CCC-SLP 06/26/2023, 4:44 PM

## 2023-06-28 ENCOUNTER — Ambulatory Visit: Admitting: Family Medicine

## 2023-06-28 ENCOUNTER — Encounter: Payer: Self-pay | Admitting: Family Medicine

## 2023-06-28 VITALS — BP 120/76 | HR 80 | Temp 97.4°F | Wt 163.8 lb

## 2023-06-28 DIAGNOSIS — E1140 Type 2 diabetes mellitus with diabetic neuropathy, unspecified: Secondary | ICD-10-CM | POA: Diagnosis not present

## 2023-06-28 DIAGNOSIS — Z7984 Long term (current) use of oral hypoglycemic drugs: Secondary | ICD-10-CM

## 2023-06-28 DIAGNOSIS — I5022 Chronic systolic (congestive) heart failure: Secondary | ICD-10-CM

## 2023-06-28 DIAGNOSIS — Z8673 Personal history of transient ischemic attack (TIA), and cerebral infarction without residual deficits: Secondary | ICD-10-CM | POA: Diagnosis not present

## 2023-06-28 DIAGNOSIS — I639 Cerebral infarction, unspecified: Secondary | ICD-10-CM

## 2023-06-28 MED ORDER — EMPAGLIFLOZIN 10 MG PO TABS: 10.0000 mg | ORAL_TABLET | Freq: Every day | ORAL | 3 refills | Status: DC

## 2023-06-28 MED ORDER — APIXABAN 5 MG PO TABS: 5.0000 mg | ORAL_TABLET | Freq: Two times a day (BID) | ORAL | 3 refills | Status: AC

## 2023-06-28 NOTE — Progress Notes (Signed)
   Subjective:    Patient ID: Taveon Enyeart, male    DOB: 02/11/1946, 78 y.o.   MRN: [id number]  HPI Here to follow up on diabetes. He has seen Cardiology twice since our last visit, and they seen to be pleased with his CHF and HTN. He is taking Metformin 1000 mg BID and Jardiance 10 mg daily. His am fasting glucoses are ranging from 97 to 118, and his random glucoses during the day are mostly in the 120's. He is slowly getting more strength. He stopped PT but he continues to get ST.    Review of Systems  Constitutional: Negative.   Respiratory: Negative.    Cardiovascular: Negative.        Objective:   Physical Exam Constitutional:      Appearance: Normal appearance.  Cardiovascular:     Rate and Rhythm: Normal rate and regular rhythm.     Pulses: Normal pulses.     Heart sounds: Normal heart sounds.  Pulmonary:     Effort: Pulmonary effort is normal.     Breath sounds: Normal breath sounds.  Neurological:     Mental Status: He is alert.     Comments: He still walks with a slight limp. He has some minor word finding difficulty, but his speech has improved quite a bit            Assessment & Plan:  He continues to recover from his stroke. His HTN and CHF are stable. His diabetes is under much better control now. We will see him back in 2 months to check another A1c.  Gershon Crane, MD

## 2023-06-29 ENCOUNTER — Ambulatory Visit: Payer: Medicare PPO | Admitting: Family Medicine

## 2023-07-03 NOTE — Telephone Encounter (Signed)
 Oral Oncology Pharmacist Encounter   Spoke with patient on 05/18/23 about taking the zytiga as only ONE pill per day. He will take this with a low fat meal as discussed and attached are some options. He can take this with the prednisone since he will have some food on his stomach in the morning. He can start taking just the one pill of zytiga tomorrow morning.    Please let me know when you are almost out of the zytiga from the bottle and we will get it set up with the [facility]. Please notify [facility] that you will fill the zytiga with [place] going forward. Attached to the mychart message was the low fat meal options document.    If you have any questions you can reply to this message or call me at [phone] during normal business hours during the week.   Bethel Born, PharmD Hematology/Oncology Clinical Pharmacist [facility] [phone]

## 2023-07-10 ENCOUNTER — Encounter: Payer: Self-pay | Admitting: Neurology

## 2023-07-10 ENCOUNTER — Encounter: Payer: Self-pay | Admitting: Family Medicine

## 2023-07-10 ENCOUNTER — Telehealth: Admitting: Family Medicine

## 2023-07-10 ENCOUNTER — Ambulatory Visit: Admitting: Speech Pathology

## 2023-07-10 DIAGNOSIS — B356 Tinea cruris: Secondary | ICD-10-CM | POA: Diagnosis not present

## 2023-07-10 MED ORDER — CLOTRIMAZOLE 1 % EX LOTN: 1.0000 | TOPICAL_LOTION | Freq: Two times a day (BID) | CUTANEOUS | 0 refills | Status: AC

## 2023-07-10 NOTE — Progress Notes (Signed)
 E-Visit for [employer]  We are sorry that you are not feeling well. Here is how we plan to help!  Based on what you shared with me it looks like you have tinea cruris, or "Jock Itch".  The symptoms of Jock Itch include red, peeling, itchy rash that affects the groin (crease where the leg meets the trunk).  This fungal infection can be spread through shared towels, clothing, bedding, or hard surfaces (particularly in moist areas) such as shower stalls, locker room floors, or pool area that has the fungus present. If you have a fungal infection on one part of your body, you can also spread it to other parts. For instance, men with a fungal infection on their feet sometimes spread it to their groin.  I am recommending:Clotrimazole 1% cream or gel, apply to area twice per day   Prescription medications are only indicated for an extensive rash or if over the counter treatments have failed.  HOME CARE:  Keep affected area clean, dry, and cool. Wash with soap and shampoo after sports or exercise and dry yourself well after bathing or swimming Wear cotton underwear and change them if they become damp or sweaty. Avoid using swimming pools, public showers, or baths.  GET HELP RIGHT AWAY IF:  Symptoms that don't away after treatment. Severe itching that persists. If your rash spreads or swells. If your rash begins to have drainage or smell. You develop a fever.  MAKE SURE YOU   Understand these instructions. Will watch your condition. Will get help right away if you are not doing well or get worse.  Thank you for choosing an e-visit.  Your e-visit answers were reviewed by a board certified advanced clinical practitioner to complete your personal care plan. Depending upon the condition, your plan could have included both over the counter or prescription medications.  Please review your pharmacy choice. Make sure the pharmacy is open so you can pick up prescription now. If there is a problem,  you may contact your provider through [employer] and have the prescription routed to another pharmacy.  Your safety is important to [place]. If you have drug allergies check your prescription carefully.   For the next 24 hours you can use MyChart to ask questions about today's visit, request a non-urgent call back, or ask for a work or school excuse. You will get an email in the next two days asking about your experience. I hope that your e-visit has been valuable and will speed your recovery.   References or for more information:  [email or web address]=[email or web address]&source=search_result&selectedTitle=3~52&usage_type=default&display_rank=3    have provided 5 minutes of non face to face time during this encounter for chart review and documentation.

## 2023-07-11 ENCOUNTER — Ambulatory Visit: Attending: Cardiology

## 2023-07-11 DIAGNOSIS — I5022 Chronic systolic (congestive) heart failure: Secondary | ICD-10-CM

## 2023-07-11 DIAGNOSIS — I639 Cerebral infarction, unspecified: Secondary | ICD-10-CM

## 2023-07-11 DIAGNOSIS — I2510 Atherosclerotic heart disease of native coronary artery without angina pectoris: Secondary | ICD-10-CM

## 2023-07-14 NOTE — Telephone Encounter (Signed)
 Spoke with pt stated that he is using a cream prescribed and its helping. Nothing further needed

## 2023-07-17 ENCOUNTER — Encounter: Payer: Self-pay | Admitting: Speech Pathology

## 2023-07-17 ENCOUNTER — Ambulatory Visit: Attending: Internal Medicine | Admitting: Speech Pathology

## 2023-07-17 DIAGNOSIS — R41841 Cognitive communication deficit: Secondary | ICD-10-CM | POA: Diagnosis not present

## 2023-07-17 DIAGNOSIS — R4701 Aphasia: Secondary | ICD-10-CM | POA: Insufficient documentation

## 2023-07-17 NOTE — Therapy (Signed)
 OUTPATIENT SPEECH LANGUAGE PATHOLOGY TREATMENT   Patient Name: [name] [name] MRN: [id number] DOB:November 05, 1945, 78 y.o., male Today's Date: 07/17/2023  PCP: Nelwyn Salisbury, MD REFERRING PROVIDER: Starleen Arms, MD  END OF SESSION:  End of Session - 07/17/23 1445     Visit Number 7    Number of Visits 25    Date for SLP Re-Evaluation 08/23/23    Authorization Type [employer]    SLP Start Time 1445    SLP Stop Time  1530    SLP Time Calculation (min) 45 min    Activity Tolerance Patient tolerated treatment well              Past Medical History:  Diagnosis Date   CAD (coronary artery disease)    Constipation    Diabetes mellitus without complication (HCC)    borderline/ pre diabetic   NSTEMI (non-ST elevated myocardial infarction) (HCC)    at [place]   Prostate cancer (HCC)    Stroke (HCC)    L side - 40 years ago   Past Surgical History:  Procedure Laterality Date   CARDIAC CATHETERIZATION     CATARACT EXTRACTION Bilateral    05/2021; 07/2021   CORONARY ARTERY BYPASS GRAFT     at [facility]   Patient Active Problem List   Diagnosis Date Noted   Type 2 diabetes mellitus with diabetic neuropathy, unspecified (HCC) 06/01/2023   Chronic systolic CHF (congestive heart failure), NYHA class 2 (HCC) 06/01/2023   Left femoral vein DVT (HCC) 05/22/2023   Abnormal echocardiogram 05/22/2023   DNR (do not resuscitate) 05/22/2023   Acute CVA (cerebrovascular accident) (HCC) 05/21/2023   Current chronic use of inhaled steroid 05/21/2023   Prostate cancer (HCC) 10/14/2022   Constipation 10/14/2022   Depression 10/14/2022   Elevated PSA, greater than or equal to 20 ng/ml 07/14/2022   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   Neuropathic pain of hand, right 10/21/2021   Syncope 10/07/2021   Stroke (HCC) 10/07/2021   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 (HCC)    Acute ischemic stroke (HCC) 10/06/2021   CAD (coronary artery disease) 10/06/2021   PVD  (peripheral vascular disease) (HCC) 04/15/2019   Dyslipidemia, goal LDL below 70 04/15/2019   History of CVA with residual deficit 04/15/2019   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 MRI Brain shows acute infarct in the L middle frontal gyrus as well as remote pontine infarcts.   PAIN: Are you having pain? No                                                                                                   TREATMENT DATE:     07/17/23: [name] [name] drove to car part  store and communicate the parts and type of tires [name] [name] needed. [name] [name] continues to require effort to spell and type when doing on line searches. Targeted word finding and written expression generating list of 15 words re: car racing - rare min semantic cues to generate 15 words, occasional min verbal cues for accurate written expression - [name] [name] ID errors with rare min. Auditory comprehension in instructions and conversation - [name] [name] endorses improved auditory comprehension. To target sentence generation, word finding  Scientist, product/process development (VNeST) was utilized. The pt generated 3 subjects and objects for 2 verbs (split, chase), for a total of 6 subject objects. Pt required occasional min cues. Pt generated 2 complex sentences by answering "wh" questions. Pt required rare min cues to generate complex sentences.   06/26/23: Targeted sentence generation generating 2-3 sentences with words that have multiple meanings - With min extended time, Render generated 10 sentences with accurate grammar with rare min A. Targeted written  expression at word level in simple divergent naming/writing (fruit) - [name] [name] named and wrote 8 fruits with usual mod A to ID and correct errors. [name] [name] benefits from cues to spell aloud prior to writing. Even with copy cues, [name] occasionally required multiple cues to ID that [name] [name] copied wrong. In conversation targeting cohesive narrative, [name] [name] benefits from visual cues of topic written down and his ideas/points written down to maintain topic.   3/12/25[name] missed last session due to not feeling well. Targeted written expression of name and  address  -[name] [name] required extended time, however [name] [name] Id'd and self corrected errors with mod I. Written expression targeting car parts for ordering parts on line - [name] [name] required frequent mod verbal cues to correctly write and self correct errors - [name] [name] generated 5 words. Targeted verbal compensations for aphasia generating 3 similarities and differences for 3 sets of related words - [name] [name] required frequent questioning cues to generate a total of 9 similarities and differences. Targeted verbal compensations for aphasia generating 3 descriptions/clues for a high frequency word using sematic feature analysis with usual questioning cues. HW is to write list of 15 car part words without help, then to check words with [name] [name].  06/14/23: [name] [name] and [name] [name] report improved communication with friends and family. [name] [name] feels [name] [name] is forming his thoughts better.  To target word finding and sentence generation  Scientist, product/process development (VNeST) was utilized. The pt generated 3 subjects and objects for 3 verbs (measure, push, deliver), for a total of 9 subject objects. Pt required occasional min to mod semantic and clarification cues. Pt generated 3 complex sentences by answering "wh" questions. Pt required occasional min  cues to generate complex sentences. Targeted persuasive language generating 3 opinions re: POTUS - using visual aid with Topic on top and numbers 1-3, [name] [name]  generated 3 salient opinions and gave 1-2 reasons for each with occasional questioning cues for clarification and 2 verbal cues as well as visual to ID tangential and vague language. Targeted word finding and sentence formulation generating 4 safety improvements in racing again using visual organization cues to remain on topic with 2 clarification questions and verbal cues to ID tangential vague language   06/12/23: Endorsed ongoing word finding difficulty. Exhibited adequate ability to converse in conversation with some prompting required to clarify vague language. Conducted ongoing trials of VNeST. Pt wrote answers with usual assistance required for spelling and error awareness. Appropriately named 2/3 who & what components. Usual prompting required to ID errors for final who/what components. Able to correct  response with mod questioning cues. Answered when/what/why with rare min A. SLP assisted intermittently with syntax components. Read sentences with 75% accuracy, as pt stated unrelated response with initial starter. Self-corrected with prompt.   06/07/23: Targeted word finding naming 15 items re: car parts with occasional min A. Targeted auditory comprehension generating 8 strategies to support auditory comprehension. Spouse reports [name] [name] get frustrated and snaps at her when she is talking too much. Education re: allowing more time to process and extra time to answer questions as well as saying 1 direction at a time. See Patient Instructions for other strategies. Modeled slow rate, pausing and allowing extra time to respond for spouse. Pt endorsed this is helpful. In conversation re: his prior profession, [name] [name] benefits from limiting interruptions when [name] [name] is conversing, and allowing for extended pauses for word finding without communication partner "jumping in." Educated and modeled for spouse.  To target word finding, grammar and complex sentence Audiological scientist (VNeST)  was utilized. The pt generated 3 subjects and objects for 1 verb (drive), for a total of 3 subject objects. Pt required occasional semantic and questioning cues and extended time cues. Pt generated 1 complex sentences by answering "wh" questions. Pt required occasional min verbal cues to generate complex sentences.    05/31/23 (eval): Evaluation completed, reviewed goals, aphasia homework provided    PATIENT EDUCATION: Education details: See Patient Instructions, See Today's Treatment, Compensations for aphasia and congition Person educated: Patient and Spouse Education method: Explanation, Demonstration, Verbal cues, and Handouts Education comprehension: verbalized understanding and needs further education   GOALS: Goals reviewed with patient? Yes  SHORT TERM GOALS: Target date: 06/28/23  Pt will name 15 personally relevant items in a category with rare min A Baseline: Goal status:MET  2.  Pt will write biographical information self correcting errors with occasional min A Baseline:  Goal status: MET  3.  Pt & caregiver will carryover 2 compensatory strategies to support auditory comprehension in conversation over 1 week Baseline:  Goal status: MET  4.  Pt will generate grammatically complex mildly complex sentences in structured language task such as VNeST with occasional min A Baseline:  Goal status: MET  5.  Pt will use verbal compensations for aphasia in structured naming task 4/5 opportunities with occasional min A Baseline:  Goal status: PARTIALLY MET  6.  Pt will write names of tools and car parts, self correcting as needed, to facilitate ordering of car parts on the internet Baseline:  Goal status: MET  LONG TERM GOALS: Target date: 08/23/23  Pt will self correct agrammitism in conversation 4/5 opportunities with usual min A Baseline:  Goal status: ONGOING  2.  Pt will write or respond to emails at simple sentence level self correcting aphasic errors with  occasional min A Baseline:  Goal status: ONGOING  3.  Pt will use external aid/calendar to manage his appointments and schedule/to do list with occasional min A from spouse Baseline:  Goal status: ONGOING  4.  Pt will use verbal compensations for aphasia in simple conversation with occasional min A Baseline:  Goal status: ONGOING  5.  Pt will explain a personally relevant topic with cohesion to require 2 or less requests for clarification  Baseline:  Goal status: ONGOING  ASSESSMENT:  CLINICAL IMPRESSION: Patient is a 78 y.o. male who was seen today for mild cognitive impairments and moderate aphasia. Kunaal continues to make progress improving word finding and reducing agrammatism in narrative discourse. [name] [name] reports improved confidence  communicating. [name] [name] continues to required visual cues to maintain topic and reduce tangential language. [name] [name] continues to required frequent mod A to use verbal compensations for aphasia and for writing at word level. [name] [name] is still telling him how to spell car parts for on line ordering.  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    OBJECTIVE IMPAIRMENTS: include attention, memory, executive functioning, and aphasia. These impairments are limiting patient from managing medications, managing appointments, and effectively communicating at home and in community. Factors affecting potential to achieve goals and functional outcome are previous level of function.. Patient will benefit from skilled SLP services to address above impairments and improve overall function.  REHAB POTENTIAL: Good  PLAN:  SLP FREQUENCY: 2x/week  SLP DURATION: 12 weeks  PLANNED INTERVENTIONS: Language facilitation, Environmental controls, Cueing hierachy, Cognitive reorganization, and Internal/external aids    [name] [name], Radene Journey, CCC-SLP 07/17/2023, 3:35 PM

## 2023-07-18 ENCOUNTER — Ambulatory Visit (HOSPITAL_COMMUNITY): Attending: Cardiology

## 2023-07-18 ENCOUNTER — Other Ambulatory Visit: Payer: Self-pay | Admitting: Cardiology

## 2023-07-18 DIAGNOSIS — I10 Essential (primary) hypertension: Secondary | ICD-10-CM | POA: Diagnosis not present

## 2023-07-18 DIAGNOSIS — I639 Cerebral infarction, unspecified: Secondary | ICD-10-CM

## 2023-07-18 DIAGNOSIS — I2510 Atherosclerotic heart disease of native coronary artery without angina pectoris: Secondary | ICD-10-CM | POA: Diagnosis not present

## 2023-07-18 DIAGNOSIS — I5022 Chronic systolic (congestive) heart failure: Secondary | ICD-10-CM | POA: Insufficient documentation

## 2023-07-18 DIAGNOSIS — E782 Mixed hyperlipidemia: Secondary | ICD-10-CM

## 2023-07-18 DIAGNOSIS — E1140 Type 2 diabetes mellitus with diabetic neuropathy, unspecified: Secondary | ICD-10-CM | POA: Insufficient documentation

## 2023-07-18 LAB — ECHOCARDIOGRAM LIMITED
Area-P 1/2: 3.27 cm2
S' Lateral: 3.4 cm

## 2023-07-19 ENCOUNTER — Ambulatory Visit: Admitting: Speech Pathology

## 2023-07-19 ENCOUNTER — Encounter: Payer: Self-pay | Admitting: Speech Pathology

## 2023-07-19 DIAGNOSIS — R4701 Aphasia: Secondary | ICD-10-CM | POA: Diagnosis not present

## 2023-07-19 DIAGNOSIS — R41841 Cognitive communication deficit: Secondary | ICD-10-CM

## 2023-07-19 NOTE — Therapy (Signed)
 OUTPATIENT SPEECH LANGUAGE PATHOLOGY TREATMENT   Patient Name: [name] [name] MRN: [id number] DOB:11/23/1945, 78 y.o., male Today's Date: 07/19/2023  PCP: [name] Salisbury, MD REFERRING PROVIDER: Starleen Arms, MD  END OF SESSION:  End of Session - 07/19/23 1238     Visit Number 8    Number of Visits 25    Date for SLP Re-Evaluation 08/23/23    Authorization Type [employer]    SLP Start Time 1230    SLP Stop Time  1315    SLP Time Calculation (min) 45 min    Activity Tolerance Patient tolerated treatment well              Past Medical History:  Diagnosis Date   CAD (coronary artery disease)    Constipation    Diabetes mellitus without complication (HCC)    borderline/ pre diabetic   NSTEMI (non-ST elevated myocardial infarction) (HCC)    at [place]   Prostate cancer (HCC)    Stroke (HCC)    L side - 40 years ago   Past Surgical History:  Procedure Laterality Date   CARDIAC CATHETERIZATION     CATARACT EXTRACTION Bilateral    05/2021; 07/2021   CORONARY ARTERY BYPASS GRAFT     at [facility]   Patient Active Problem List   Diagnosis Date Noted   Type 2 diabetes mellitus with diabetic neuropathy, unspecified (HCC) 06/01/2023   Chronic systolic CHF (congestive heart failure), NYHA class 2 (HCC) 06/01/2023   Left femoral vein DVT (HCC) 05/22/2023   Abnormal echocardiogram 05/22/2023   DNR (do not resuscitate) 05/22/2023   Acute CVA (cerebrovascular accident) (HCC) 05/21/2023   Current chronic use of inhaled steroid 05/21/2023   Prostate cancer (HCC) 10/14/2022   Constipation 10/14/2022   Depression 10/14/2022   Elevated PSA, greater than or equal to 20 ng/ml 07/14/2022   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   Neuropathic pain of hand, right 10/21/2021   Syncope 10/07/2021   Stroke (HCC) 10/07/2021   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 (HCC)    Acute ischemic stroke (HCC) 10/06/2021   CAD (coronary artery disease) 10/06/2021   PVD  (peripheral vascular disease) (HCC) 04/15/2019   Dyslipidemia, goal LDL below 70 04/15/2019   History of CVA with residual deficit 04/15/2019   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 MRI Brain shows acute infarct in the L middle frontal gyrus as well as remote pontine infarcts.   PAIN: Are you having pain? No                                                                                                   TREATMENT DATE:   07/19/23: Targeted written expression, sentence generation and grammar  generating simple sentence with 2 given words with rare min A 10 sentences - when writing the sentence, [name] [name] required usual mod A to ID words and grammar markers he left out and to self correct errors. Targeted cohesive narrative generating 3 pros and cons for controversial current events - with visual cues (topic and numbers) he generated 3 pros's for DOGE and ICE with rare min A and required 1 questioning cues for clarification and 2 verbal cues to remain on topic and generate 2 cons fo reach topic. Homework provided unscramble sentences and generating sentences with 2 given words   07/17/23: [name] [name] drove to car part store and communicate the parts and type of tires he needed. He continues to require effort to spell and type when doing on line searches. Targeted word finding and written expression generating list of 15 words re: car racing - rare min semantic cues to generate 15 words, occasional min verbal cues for accurate written expression - he ID errors with rare min. Auditory comprehension in  instructions and conversation - he endorses improved auditory comprehension. To target sentence generation, word finding  Scientist, product/process development (VNeST) was utilized. The pt generated 3 subjects and objects for 2 verbs (split, chase), for a total of 6 subject objects. Pt required occasional min cues. Pt generated 2 complex sentences by answering "wh" questions. Pt required rare min cues to generate complex sentences.   06/26/23: Targeted sentence generation generating 2-3 sentences with words that have multiple meanings - With min extended time, [name] [name] generated 10 sentences with accurate grammar with rare min A. Targeted written expression at word level in simple divergent naming/writing (fruit) - [name] [name] named and wrote 8 fruits with usual mod A to ID and correct errors. He benefits from cues to spell aloud prior to writing. Even with copy cues, [name] [name] occasionally required multiple cues to ID that he copied wrong. In conversation targeting cohesive narrative, he benefits from visual cues of topic written down and his ideas/points written down to maintain topic.   3/12/25[name] missed last session due to not feeling well. Targeted written expression of name and  address  -[name] [name] required extended time, however he Id'd and self corrected errors with mod I. Written expression targeting car parts for ordering parts on line - [name] [name] required frequent mod verbal cues to correctly write and self correct errors - he generated 5 words. Targeted verbal compensations for aphasia generating 3 similarities and differences for 3 sets of related words - [name] [name] required frequent questioning cues to generate a total of 9 similarities and differences. Targeted verbal compensations for aphasia generating 3 descriptions/clues for a high frequency word using sematic feature analysis with usual questioning cues. HW is to write list of 15 car part words without help, then to check words with [name] [name].  06/14/23:  [name] [name] and [name] [name] report improved communication with friends and family. He feels he is forming his thoughts better.  To target word finding and sentence generation  Scientist, product/process development (VNeST) was utilized. The pt generated 3 subjects and objects for 3 verbs (measure, push, deliver), for a total of 9 subject objects. Pt required occasional min to mod semantic and clarification cues. Pt generated 3 complex sentences by answering "wh" questions. Pt required occasional min  cues to generate complex sentences. Targeted persuasive language generating 3 opinions re: POTUS - using visual aid with Topic on top and numbers 1-3, Armani generated 3 salient opinions and gave 1-2 reasons for each with occasional questioning cues  for clarification and 2 verbal cues as well as visual to ID tangential and vague language. Targeted word finding and sentence formulation generating 4 safety improvements in racing again using visual organization cues to remain on topic with 2 clarification questions and verbal cues to ID tangential vague language   06/12/23: Endorsed ongoing word finding difficulty. Exhibited adequate ability to converse in conversation with some prompting required to clarify vague language. Conducted ongoing trials of VNeST. Pt wrote answers with usual assistance required for spelling and error awareness. Appropriately named 2/3 who & what components. Usual prompting required to ID errors for final who/what components. Able to correct response with mod questioning cues. Answered when/what/why with rare min A. SLP assisted intermittently with syntax components. Read sentences with 75% accuracy, as pt stated unrelated response with initial starter. Self-corrected with prompt.   06/07/23: Targeted word finding naming 15 items re: car parts with occasional min A. Targeted auditory comprehension generating 8 strategies to support auditory comprehension. Spouse reports Rhyatt get frustrated and snaps at  her when she is talking too much. Education re: allowing more time to process and extra time to answer questions as well as saying 1 direction at a time. See Patient Instructions for other strategies. Modeled slow rate, pausing and allowing extra time to respond for spouse. Pt endorsed this is helpful. In conversation re: his prior profession, [place] benefits from limiting interruptions when he is conversing, and allowing for extended pauses for word finding without communication partner "jumping in." Educated and modeled for spouse.  To target word finding, grammar and complex sentence Audiological scientist (VNeST) was utilized. The pt generated 3 subjects and objects for 1 verb (drive), for a total of 3 subject objects. Pt required occasional semantic and questioning cues and extended time cues. Pt generated 1 complex sentences by answering "wh" questions. Pt required occasional min verbal cues to generate complex sentences.    05/31/23 (eval): Evaluation completed, reviewed goals, aphasia homework provided    PATIENT EDUCATION: Education details: See Patient Instructions, See Today's Treatment, Compensations for aphasia and congition Person educated: Patient and Spouse Education method: Explanation, Demonstration, Verbal cues, and Handouts Education comprehension: verbalized understanding and needs further education   GOALS: Goals reviewed with patient? Yes  SHORT TERM GOALS: Target date: 06/28/23  Pt will name 15 personally relevant items in a category with rare min A Baseline: Goal status:MET  2.  Pt will write biographical information self correcting errors with occasional min A Baseline:  Goal status: MET  3.  Pt & caregiver will carryover 2 compensatory strategies to support auditory comprehension in conversation over 1 week Baseline:  Goal status: MET  4.  Pt will generate grammatically complex mildly complex sentences in structured language task such  as VNeST with occasional min A Baseline:  Goal status: MET  5.  Pt will use verbal compensations for aphasia in structured naming task 4/5 opportunities with occasional min A Baseline:  Goal status: PARTIALLY MET  6.  Pt will write names of tools and car parts, self correcting as needed, to facilitate ordering of car parts on the internet Baseline:  Goal status: MET  LONG TERM GOALS: Target date: 08/23/23  Pt will self correct agrammitism in conversation 4/5 opportunities with usual min A Baseline:  Goal status: ONGOING  2.  Pt will write or respond to emails at simple sentence level self correcting aphasic errors with occasional min A Baseline:  Goal status: ONGOING  3.  Pt will use  external aid/calendar to manage his appointments and schedule/to do list with occasional min A from spouse Baseline:  Goal status: ONGOING  4.  Pt will use verbal compensations for aphasia in simple conversation with occasional min A Baseline:  Goal status: ONGOING  5.  Pt will explain a personally relevant topic with cohesion to require 2 or less requests for clarification  Baseline:  Goal status: ONGOING  ASSESSMENT:  CLINICAL IMPRESSION: Patient is a 78 y.o. male who was seen today for mild cognitive impairments and moderate aphasia. Ephram continues to make progress improving word finding and reducing agrammatism in narrative discourse. He reports improved confidence communicating. He continues to required visual cues to maintain topic and reduce tangential language. He continues to required frequent mod A to use verbal compensations for aphasia and for writing at word level. [name] [name] is still telling him how to spell car parts for on line ordering.  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    OBJECTIVE IMPAIRMENTS: include attention, memory, executive functioning, and aphasia. These impairments  are limiting patient from managing medications, managing appointments, and effectively communicating at home and in community. Factors affecting potential to achieve goals and functional outcome are previous level of function.. Patient will benefit from skilled SLP services to address above impairments and improve overall function.  REHAB POTENTIAL: Good  PLAN:  SLP FREQUENCY: 2x/week  SLP DURATION: 12 weeks  PLANNED INTERVENTIONS: Language facilitation, Environmental controls, Cueing hierachy, Cognitive reorganization, and Internal/external aids    Gorge Almanza, Radene Journey, CCC-SLP 07/19/2023, 1:25 PM

## 2023-07-24 ENCOUNTER — Ambulatory Visit: Admitting: Speech Pathology

## 2023-07-24 ENCOUNTER — Encounter: Payer: Self-pay | Admitting: Speech Pathology

## 2023-07-24 DIAGNOSIS — R41841 Cognitive communication deficit: Secondary | ICD-10-CM

## 2023-07-24 DIAGNOSIS — R4701 Aphasia: Secondary | ICD-10-CM | POA: Diagnosis not present

## 2023-07-24 NOTE — Therapy (Signed)
 OUTPATIENT SPEECH LANGUAGE PATHOLOGY TREATMENT   Patient Name: [name] [name] MRN: [id number] DOB:28-Apr-1945, 78 y.o., male Today's Date: 07/24/2023  PCP: Donley Furth, MD REFERRING PROVIDER: Epifanio Haste, MD  END OF SESSION:  End of Session - 07/24/23 1110     Visit Number 9    Number of Visits 25    Date for SLP Re-Evaluation 08/23/23    Authorization Type [employer]    SLP Start Time 1105    SLP Stop Time  1145    SLP Time Calculation (min) 40 min    Activity Tolerance Patient tolerated treatment well              Past Medical History:  Diagnosis Date   CAD (coronary artery disease)    Constipation    Diabetes mellitus without complication (HCC)    borderline/ pre diabetic   NSTEMI (non-ST elevated myocardial infarction) (HCC)    at [place]   Prostate cancer (HCC)    Stroke (HCC)    L side - 40 years ago   Past Surgical History:  Procedure Laterality Date   CARDIAC CATHETERIZATION     CATARACT EXTRACTION Bilateral    05/2021; 07/2021   CORONARY ARTERY BYPASS GRAFT     at [facility]   Patient Active Problem List   Diagnosis Date Noted   Type 2 diabetes mellitus with diabetic neuropathy, unspecified (HCC) 06/01/2023   Chronic systolic CHF (congestive heart failure), NYHA class 2 (HCC) 06/01/2023   Left femoral vein DVT (HCC) 05/22/2023   Abnormal echocardiogram 05/22/2023   DNR (do not resuscitate) 05/22/2023   Acute CVA (cerebrovascular accident) (HCC) 05/21/2023   Current chronic use of inhaled steroid 05/21/2023   Prostate cancer (HCC) 10/14/2022   Constipation 10/14/2022   Depression 10/14/2022   Elevated PSA, greater than or equal to 20 ng/ml 07/14/2022   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   Neuropathic pain of hand, right 10/21/2021   Syncope 10/07/2021   Stroke (HCC) 10/07/2021   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 (HCC)    Acute ischemic stroke (HCC) 10/06/2021   CAD (coronary artery disease) 10/06/2021   PVD  (peripheral vascular disease) (HCC) 04/15/2019   Dyslipidemia, goal LDL below 70 04/15/2019   History of CVA with residual deficit 04/15/2019   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 MRI Brain shows acute infarct in the L middle frontal gyrus as well as remote pontine infarcts.   PAIN: Are you having pain? No                                                                                                   TREATMENT DATE:   07/24/23: Targeted written expression at simple sentence level  generating sentences with multiple meaning words - [name] [name] verbally generates grammatically correct sentences with mod I, however written sentences require consistent verbal and visual cues to ID errors. Grammatic markers - consistently adding "s" to singular verbs, writing "the" "an" and "a" before nouns. He ID's spelling errors with occasional min A however he requires frequent mod written and next letter cues to correct errors.  07/19/23: Targeted written expression, sentence generation and grammar generating simple sentence with 2 given words with rare min A 10 sentences - when writing the sentence, [name] [name] required usual mod A to ID words and grammar markers he left out and to self correct errors. Targeted cohesive narrative generating 3 pros and cons for controversial current events - with visual cues (topic and numbers) he generated 3 pros's for DOGE and ICE with rare min A and required 1 questioning cues for clarification and 2 verbal cues to remain on topic and generate 2 cons fo reach topic. Homework  provided unscramble sentences and generating sentences with 2 given words   07/17/23: [name] [name] drove to car part store and communicate the parts and type of tires he needed. He continues to require effort to spell and type when doing on line searches. Targeted word finding and written expression generating list of 15 words re: car racing - rare min semantic cues to generate 15 words, occasional min verbal cues for accurate written expression - he ID errors with rare min. Auditory comprehension in instructions and conversation - he endorses improved auditory comprehension. To target sentence generation, word finding  Scientist, product/process development (VNeST) was utilized. The pt generated 3 subjects and objects for 2 verbs (split, chase), for a total of 6 subject objects. Pt required occasional min cues. Pt generated 2 complex sentences by answering "wh" questions. Pt required rare min cues to generate complex sentences.   06/26/23: Targeted sentence generation generating 2-3 sentences with words that have multiple meanings - With min extended time, [name] [name] generated 10 sentences with accurate grammar with rare min A. Targeted written expression at word level in simple divergent naming/writing (fruit) - [name] [name] named and wrote 8 fruits with usual mod A to ID and correct errors. He benefits from cues to spell aloud prior to writing. Even with copy cues, [name] occasionally required multiple cues to ID that he copied wrong. In conversation targeting cohesive narrative, he benefits from visual cues of topic written down and his ideas/points written down to maintain topic.   3/12/25Jorge [name] missed last session due to not feeling well. Targeted written expression of name and  address  -[name] [name] required extended time, however he Id'd and self corrected errors with mod I. Written expression targeting car parts for ordering parts on line - [name] [name] required frequent mod verbal cues to correctly write and self correct  errors - he generated 5 words. Targeted verbal compensations for aphasia generating 3 similarities and differences for 3 sets of related words - [name] [name] required frequent questioning cues to generate a total of 9 similarities and differences. Targeted verbal compensations for aphasia generating 3 descriptions/clues for a high frequency word using sematic feature analysis with usual questioning cues. HW is to write list of 15 car part words without help, then to check words with [name] [name].  06/14/23: [name] [name] and [name] [name] report improved communication with friends and family. He feels he is forming his thoughts better.  To target word finding and sentence generation  Scientist, product/process development (VNeST) was utilized. The pt generated 3 subjects and objects for 3  verbs (measure, push, deliver), for a total of 9 subject objects. Pt required occasional min to mod semantic and clarification cues. Pt generated 3 complex sentences by answering "wh" questions. Pt required occasional min  cues to generate complex sentences. Targeted persuasive language generating 3 opinions re: POTUS - using visual aid with Topic on top and numbers 1-3, [name] [name] generated 3 salient opinions and gave 1-2 reasons for each with occasional questioning cues for clarification and 2 verbal cues as well as visual to ID tangential and vague language. Targeted word finding and sentence formulation generating 4 safety improvements in racing again using visual organization cues to remain on topic with 2 clarification questions and verbal cues to ID tangential vague language   06/12/23: Endorsed ongoing word finding difficulty. Exhibited adequate ability to converse in conversation with some prompting required to clarify vague language. Conducted ongoing trials of VNeST. Pt wrote answers with usual assistance required for spelling and error awareness. Appropriately named 2/3 who & what components. Usual prompting required to ID errors for final who/what  components. Able to correct response with mod questioning cues. Answered when/what/why with rare min A. SLP assisted intermittently with syntax components. Read sentences with 75% accuracy, as pt stated unrelated response with initial starter. Self-corrected with prompt.   06/07/23: Targeted word finding naming 15 items re: car parts with occasional min A. Targeted auditory comprehension generating 8 strategies to support auditory comprehension. Spouse reports [name] [name] get frustrated and snaps at her when she is talking too much. Education re: allowing more time to process and extra time to answer questions as well as saying 1 direction at a time. See Patient Instructions for other strategies. Modeled slow rate, pausing and allowing extra time to respond for spouse. Pt endorsed this is helpful. In conversation re: his prior profession, [name] [name] benefits from limiting interruptions when he is conversing, and allowing for extended pauses for word finding without communication partner "jumping in." Educated and modeled for spouse.  To target word finding, grammar and complex sentence Audiological scientist (VNeST) was utilized. The pt generated 3 subjects and objects for 1 verb (drive), for a total of 3 subject objects. Pt required occasional semantic and questioning cues and extended time cues. Pt generated 1 complex sentences by answering "wh" questions. Pt required occasional min verbal cues to generate complex sentences.    05/31/23 (eval): Evaluation completed, reviewed goals, aphasia homework provided    PATIENT EDUCATION: Education details: See Patient Instructions, See Today's Treatment, Compensations for aphasia and congition Person educated: Patient and Spouse Education method: Explanation, Demonstration, Verbal cues, and Handouts Education comprehension: verbalized understanding and needs further education   GOALS: Goals reviewed with patient? Yes  SHORT TERM GOALS:  Target date: 06/28/23  Pt will name 15 personally relevant items in a category with rare min A Baseline: Goal status:MET  2.  Pt will write biographical information self correcting errors with occasional min A Baseline:  Goal status: MET  3.  Pt & caregiver will carryover 2 compensatory strategies to support auditory comprehension in conversation over 1 week Baseline:  Goal status: MET  4.  Pt will generate grammatically complex mildly complex sentences in structured language task such as VNeST with occasional min A Baseline:  Goal status: MET  5.  Pt will use verbal compensations for aphasia in structured naming task 4/5 opportunities with occasional min A Baseline:  Goal status: PARTIALLY MET  6.  Pt will write names of tools and car parts, self correcting as  needed, to facilitate ordering of car parts on the internet Baseline:  Goal status: MET  LONG TERM GOALS: Target date: 08/23/23  Pt will self correct agrammitism in conversation 4/5 opportunities with usual min A Baseline:  Goal status: ONGOING  2.  Pt will write or respond to emails at simple sentence level self correcting aphasic errors with occasional min A Baseline:  Goal status: ONGOING  3.  Pt will use external aid/calendar to manage his appointments and schedule/to do list with occasional min A from spouse Baseline:  Goal status: ONGOING  4.  Pt will use verbal compensations for aphasia in simple conversation with occasional min A Baseline:  Goal status: ONGOING  5.  Pt will explain a personally relevant topic with cohesion to require 2 or less requests for clarification  Baseline:  Goal status: ONGOING  ASSESSMENT:  CLINICAL IMPRESSION: Patient is a 78 y.o. male who was seen today for mild cognitive impairments and moderate aphasia. [name] [name] continues to make progress improving word finding and reducing agrammatism in narrative discourse. He reports improved confidence communicating. He continues to  required visual cues to maintain topic and reduce tangential language. He continues to required frequent mod A to use verbal compensations for aphasia and for writing at word level. [name] [name] is still telling him how to spell car parts for on line ordering.  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    OBJECTIVE IMPAIRMENTS: include attention, memory, executive functioning, and aphasia. These impairments are limiting patient from managing medications, managing appointments, and effectively communicating at home and in community. Factors affecting potential to achieve goals and functional outcome are previous level of function.. Patient will benefit from skilled SLP services to address above impairments and improve overall function.  REHAB POTENTIAL: Good  PLAN:  SLP FREQUENCY: 2x/week  SLP DURATION: 12 weeks  PLANNED INTERVENTIONS: Language facilitation, Environmental controls, Cueing hierachy, Cognitive reorganization, and Internal/external aids    [name] [name], Dareen Ebbing, CCC-SLP 07/24/2023, 11:50 AM

## 2023-07-26 ENCOUNTER — Telehealth: Payer: Self-pay | Admitting: *Deleted

## 2023-07-26 ENCOUNTER — Ambulatory Visit: Admitting: Speech Pathology

## 2023-07-26 NOTE — Telephone Encounter (Signed)
 Received call from pt's wife, [name].  She states that her husband developed a "knot" at the site of his last Lupron injection. Advised that this can happen and that often it wil;l resolve on its own over time. It is just an inflammatory response  from getting an injection. Advised he can apply a cold compress on it for comfort if it bothers him. Advised that if it becomes red, swollen and warm/hot to the touch to call us . She states it not red, not or swollen. She states it might even be getting smaller in size. Se voiced understanding to call us  back should it change at al.

## 2023-07-30 NOTE — Progress Notes (Unsigned)
 Cardiology Office Note    Date:  08/01/2023  ID:  [name], [name] 08-11-1945, MRN [id number] PCP:  [name] Furth, MD  Cardiologist:  [name] Angel, MD  Electrophysiologist:  None   Chief Complaint: Follow up for HFrEF   History of Present Illness: .    [name] [name] is a 78 y.o. male with visit-pertinent history of CAD s/p  x3 with LIMA to LAD, SVG to OM1, SVG to RCA in [place], [place]  in 2017, CVA's (1980), PAD, prediabetes, prostate cancer.  In 09/2021 patient was admitted for CVA, during admission he reported syncopal episodes after being out working outside in the heat with disorientation and dizziness.  Neurology suspected left frontal acute infarct.  He was discharged with 3 months of DAPT.  In 11/2021 he had follow-up in the clinic and reported labile blood pressure with occasional head fullness.  He also noted some left-sided weakness and fatigue.  He was encouraged to monitor his blood pressure at home and increase physical activity as tolerated.  He was seen by Dr. Katheryne [name] on 01/12/2022 and patient was doing well.  He only reported occasional shaking episodes in his left leg.  On 03/22/2022 he was seen in cardiology clinic, he reported feeling well.  He reported more physical activity.  EKG was normal sinus rhythm.  Cardiac event monitor worn after CVA showed sinus rhythm, sinus bradycardia, sinus tach and 1 brief episode of NSVT.  He was instructed to continue all current medications.  On 05/21/2023 he presented to the ED with complaints of difficulty speaking, slurred speech and confusion.  Code stroke was called in the ED.  Seen by neurology.  CT head showed new areas of cortical hypoattenuation in the left frontal operculum, he was outside window for treatment.  MRI completed shows left middle frontal gyrus acute/subacute infarction.  Echo on 05/22/2023 indicated LVEF of 45 to 50%, hypokinesis of distal anteroseptal wall/apex, G1 DD.  Lower extremity vascular ultrasound on 05/22/2023 showed  acute deep vein thrombosis involving the left common femoral vein and left proximal profunda vein.  Cardiology was consulted given mild decline in EF.  During admission he denied any anginal symptoms prior to admission or shortness of breath.  Patient had been on aspirin  and Plavix  prior to admission, this was continued up until underwent lower extremity Dopplers and was found to have acute DVT, transitioned to Eliquis .  On exam he did not exhibit any signs of volume overload and denied any symptoms prior to admission.  GDMT was limited in setting of acute CVA and allowing for permissive hypertension.  Patient was seen in clinic on 06/01/2023.  He reported that he was doing okay, noted that he had been overall fatigued following his stroke.  He was still having difficulty with word finding.  Patient reported a general chest soreness since his bypass surgery in 2017 that was unchanged.  He noted occasional problems with acid reflux and had recently started on Protonix .  Patient was last seen in clinic on 06/22/2023, he had remained stable from a cardiac standpoint.  He refused ischemic evaluation.  Echocardiogram on 07/18/2023 indicated LVEF 45 to 50%, wall motion abnormalities were noted, internal cavity size was mildly dilated, grade 1 DD, RV systolic function and size was normal, there was evidence of aortic valve sclerosis without evidence of stenosis.  Today he presents for follow-up.  He reports that he has been doing well overall, he denies any chest pain, shortness of breath, lower extremity edema, orthopnea or PND.  He denies any presyncope or syncope.  He reports that he is tolerating his medications well and has no cardiac concerns or complaints today.  He notes that he is still currently working with ST, appears to continue having some difficulty with word finding.  He and his wife feel that he is doing well overall.  ROS: .   Today he denies chest pain, shortness of breath, lower extremity edema,  fatigue, palpitations, melena, hematuria, hemoptysis, diaphoresis, weakness, presyncope, syncope, orthopnea, and PND.  All other systems are reviewed and otherwise negative. Studies Reviewed: [name] [name]   EKG:  EKG is not ordered today.  CV Studies: Cardiac studies reviewed are outlined and summarized above. Otherwise please see EMR for full report. Cardiac Studies & Procedures   ______________________________________________________________________________________________     ECHOCARDIOGRAM  ECHOCARDIOGRAM LIMITED 07/18/2023  Narrative ECHOCARDIOGRAM LIMITED REPORT    Patient Name:   [name] [name]  Date of Exam: 07/18/2023 Medical Rec #:  [id number]     Height:       65.0 in Accession #:    [id number]    Weight:       163.8 lb Date of Birth:  11/03/1945     BSA:          1.817 m Patient Age:    78 years      BP:           120/76 mmHg Patient Gender: M             HR:           63 bpm. Exam Location:  [facility]  Procedure: Limited Echo, Limited Color Doppler, Cardiac Doppler and 3D Echo (Both Spectral and Color Flow Doppler were utilized during procedure).  Indications:    Heart failure with mildly reduced ejection fraction I50.22  History:        Patient has prior history of Echocardiogram examinations, most recent 05/22/2023. CAD and Previous Myocardial Infarction, Prior CABG; Risk Factors:Diabetes.  Sonographer:    [name] [name] RDCS Referring Phys: [id number] [name] [name]  IMPRESSIONS   1. Septal and apical and inferior apical hypokinesis . Left ventricular ejection fraction, by estimation, is 45 to 50%. The left ventricle has mildly decreased function. The left ventricle demonstrates regional wall motion abnormalities (see scoring diagram/findings for description). The left ventricular internal cavity size was mildly dilated. Left ventricular diastolic parameters are consistent with Grade I diastolic dysfunction (impaired relaxation). 2. Right ventricular systolic function is  normal. The right ventricular size is normal. There is normal pulmonary artery systolic pressure. 3. The mitral valve is abnormal. No evidence of mitral valve regurgitation. No evidence of mitral stenosis. 4. The aortic valve is tricuspid. There is mild calcification of the aortic valve. There is mild thickening of the aortic valve. Aortic valve regurgitation is not visualized. Aortic valve sclerosis is present, with no evidence of aortic valve stenosis. 5. The inferior vena cava is normal in size with greater than 50% respiratory variability, suggesting right atrial pressure of 3 mmHg.  FINDINGS Left Ventricle: Septal and apical and inferior apical hypokinesis. Left ventricular ejection fraction, by estimation, is 45 to 50%. The left ventricle has mildly decreased function. The left ventricle demonstrates regional wall motion abnormalities. Strain was performed and the global longitudinal strain is indeterminate. The left ventricular internal cavity size was mildly dilated. There is no left ventricular hypertrophy. Left ventricular diastolic parameters are consistent with Grade I diastolic dysfunction (impaired relaxation).  Right Ventricle: The right ventricular size is normal. No  increase in right ventricular wall thickness. Right ventricular systolic function is normal. There is normal pulmonary artery systolic pressure. The tricuspid regurgitant velocity is 1.91 m/s, and with an assumed right atrial pressure of 3 mmHg, the estimated right ventricular systolic pressure is 17.6 mmHg.  Left Atrium: Left atrial size was normal in size.  Right Atrium: Right atrial size was normal in size.  Pericardium: There is no evidence of pericardial effusion.  Mitral Valve: The mitral valve is abnormal. There is mild thickening of the mitral valve leaflet(s). No evidence of mitral valve stenosis.  Tricuspid Valve: The tricuspid valve is normal in structure. Tricuspid valve regurgitation is trivial. No  evidence of tricuspid stenosis.  Aortic Valve: The aortic valve is tricuspid. There is mild calcification of the aortic valve. There is mild thickening of the aortic valve. Aortic valve regurgitation is not visualized. Aortic valve sclerosis is present, with no evidence of aortic valve stenosis.  Pulmonic Valve: The pulmonic valve was normal in structure. Pulmonic valve regurgitation is not visualized. No evidence of pulmonic stenosis.  Aorta: The aortic root is normal in size and structure.  Venous: The inferior vena cava is normal in size with greater than 50% respiratory variability, suggesting right atrial pressure of 3 mmHg.  IAS/Shunts: The interatrial septum was not well visualized.  Additional Comments: 3D was performed not requiring image post processing on an independent workstation and was abnormal.  LEFT VENTRICLE PLAX 2D LVIDd:         4.20 cm   Diastology LVIDs:         3.40 cm   LV e' medial:    4.88 cm/s LV PW:         0.90 cm   LV E/e' medial:  9.0 LV IVS:        1.00 cm   LV e' lateral:   6.67 cm/s LVOT diam:     2.20 cm   LV E/e' lateral: 6.6 LV SV:         58 LV SV Index:   32 LVOT Area:     3.80 cm  3D Volume EF: 3D EF:        48 % LV EDV:       111 ml LV ESV:       58 ml LV SV:        53 ml  LEFT ATRIUM         Index LA diam:    3.60 cm 1.98 cm/m AORTIC VALVE LVOT Vmax:   83.30 cm/s LVOT Vmean:  52.400 cm/s LVOT VTI:    0.152 m  AORTA Ao Root diam: 3.30 cm  MITRAL VALVE               TRICUSPID VALVE MV Area (PHT): 3.27 cm    TR Peak grad:   14.6 mmHg MV Decel Time: 232 msec    TR Vmax:        191.00 cm/s MV E velocity: 44.00 cm/s MV A velocity: 81.80 cm/s  SHUNTS MV E/A ratio:  0.54        Systemic VTI:  0.15 m Systemic Diam: 2.20 cm  Janelle Mediate MD Electronically signed by Janelle Mediate MD Signature Date/Time: 07/18/2023/4:31:19 PM    Final    MONITORS  CARDIAC EVENT MONITOR 07/11/2023  Narrative Sinus bradycardia, NSR, sinus  tachycardia, occasional PVC, 5 and 6 beats of NSVT. [name] [name]       ______________________________________________________________________________________________       Current Reported  Medications:.    Current Meds  Medication Sig   abiraterone  acetate (ZYTIGA ) 250 MG tablet Take 4 tablets (1,000 mg total) by mouth daily. Take on an empty stomach 1 hour before or 2 hours after a meal (Patient taking differently: Take 250 mg by mouth daily. Take on an empty stomach 1 hour before or 2 hours after a meal. Pt takes 1 tablet daily)   acetaminophen  (TYLENOL ) 500 MG tablet Take 1 tablet (500 mg total) by mouth every 6 (six) hours as needed.   apixaban  (ELIQUIS ) 5 MG TABS tablet Take 1 tablet (5 mg total) by mouth 2 (two) times daily.   atorvastatin  (LIPITOR) 40 MG tablet Take 1 tablet (40 mg total) by mouth daily.   calcium  carbonate (TUMS EX) 750 MG chewable tablet Chew 0.5 tablets by mouth daily.   clopidogrel  (PLAVIX ) 75 MG tablet Take 1 tablet (75 mg total) by mouth daily.   empagliflozin  (JARDIANCE ) 10 MG TABS tablet Take 1 tablet (10 mg total) by mouth daily before breakfast.   gabapentin  (NEURONTIN ) 100 MG capsule Take 1 capsule (100 mg total) by mouth at bedtime.   metFORMIN  (GLUCOPHAGE ) 1000 MG tablet Take 1 tablet (1,000 mg total) by mouth 2 (two) times daily with a meal.   pantoprazole  (PROTONIX ) 40 MG tablet Take 1 tablet (40 mg total) by mouth daily.   polyethylene glycol powder (GLYCOLAX /MIRALAX ) 17 GM/SCOOP powder Take 17 g by mouth in the morning and at bedtime.   predniSONE  (DELTASONE ) 5 MG tablet Take 1 tablet (5 mg total) by mouth daily with breakfast.    Physical Exam:    VS:  BP 110/62 (BP Location: Right Arm, Patient Position: Sitting, Cuff Size: Normal)   Pulse 78   Ht 5\' 5"  (1.651 m)   Wt 163 lb (73.9 kg)   SpO2 96%   BMI 27.12 kg/m    Wt Readings from Last 3 Encounters:  07/31/23 163 lb (73.9 kg)  06/28/23 163 lb 12.8 oz (74.3 kg)  06/22/23 165 lb (74.8  kg)    GEN: Well nourished, well developed in no acute distress NECK: No JVD; No carotid bruits CARDIAC: RRR, no murmurs, rubs, gallops RESPIRATORY:  Clear to auscultation without rales, wheezing or rhonchi  ABDOMEN: Soft, non-tender, non-distended EXTREMITIES:  No edema; No acute deformity     Asessement and Plan:.    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  Echocardiogram on 07/18/2023 indicated LVEF 45 to 50%, wall motion abnormalities were noted, internal cavity size was mildly dilated, grade 1 DD, RV systolic function and size was normal, there was evidence of aortic valve sclerosis without evidence of stenosis. Today he appears euvolemic and well compensated on exam.  He denies any chest pain, shortness of breath or lower extremity edema.  Discussed ischemic evaluation, patient deferred at this time. Reviewed that he should notify the office of weight gain of 2 to 3 pounds overnight or 5 pounds in a week. Unable to escalate GDMT given normotensive pressures, per neurology should continue to avoid hypotension.  Continue Jardiance  10 mg daily.    CAD: S/p CABG x 3 with LIMA to LAD, SVG to OM1, SVG to RCA in [place]  in 2017.  Echo during admission with wall motion abnormalities however denied anginal symptoms.  Ischemic evaluation previously deferred during hospitalization given ischemic stroke. Today patient reports that he is doing well.  He denies any  anginal symptoms.  Again discussed ischemic evaluation, patient deferred. Reviewed ED precautions.  Continue Lipitor. Patient currently on Eliquis  and Plavix  per neurology given recent CVA.   CVA: Admitted on 05/21/2023.  MRI notable for left middle frontal gyrus acute/subacute infarction.  Patient today reports that he is doing well.  He denies any further residuals, continues to work with physical therapy.  Patient's cardiac monitor  indicated sinus rhythm, sinus bradycardia and tachycardia, occasional PVCs and 5 and 6 beats of NSVT, no evidence of atrial fibrillation.   DVT: During admission found to have acute DVT of left common femoral vein and left proximal profunda vein.  On Eliquis .   Hypertension: Blood pressure today 110/62. Per neurology's notes recommend keeping blood pressure normotensive and avoiding hypotension.  Encourage patient to continue monitoring blood pressure at home and notify the office if consistently greater than 130/80.   Hyperlipidemia: Last lipid profile 05/22/2023 indicated total cholesterol 200, HDL 34, triglycerides 122 and LDL 142.  Started on Lipitor 40 mg daily.  Check fasting lipid profile and LFTs.   DM type II: Last hemoglobin A1c 11.5, felt to likely be related to daily prednisone  as prior A1c was 6.1.  Patient was previously started on Jardiance  and metformin  while in hospital.  Now being followed by his PCP.   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  He is following with oncology.    Disposition: F/u with Dr. Audery Blazing in 09/2023 as scheduled.   Signed, Matan Steen D Takyra Cantrall, NP

## 2023-07-31 ENCOUNTER — Ambulatory Visit: Admitting: Speech Pathology

## 2023-07-31 ENCOUNTER — Ambulatory Visit: Attending: Cardiology | Admitting: Cardiology

## 2023-07-31 ENCOUNTER — Encounter: Payer: Self-pay | Admitting: Speech Pathology

## 2023-07-31 VITALS — BP 110/62 | HR 78 | Ht 65.0 in | Wt 163.0 lb

## 2023-07-31 DIAGNOSIS — I5022 Chronic systolic (congestive) heart failure: Secondary | ICD-10-CM | POA: Diagnosis not present

## 2023-07-31 DIAGNOSIS — R41841 Cognitive communication deficit: Secondary | ICD-10-CM | POA: Diagnosis not present

## 2023-07-31 DIAGNOSIS — I639 Cerebral infarction, unspecified: Secondary | ICD-10-CM

## 2023-07-31 DIAGNOSIS — I2510 Atherosclerotic heart disease of native coronary artery without angina pectoris: Secondary | ICD-10-CM | POA: Diagnosis not present

## 2023-07-31 DIAGNOSIS — I10 Essential (primary) hypertension: Secondary | ICD-10-CM | POA: Diagnosis not present

## 2023-07-31 DIAGNOSIS — R4701 Aphasia: Secondary | ICD-10-CM | POA: Diagnosis not present

## 2023-07-31 DIAGNOSIS — E782 Mixed hyperlipidemia: Secondary | ICD-10-CM

## 2023-07-31 DIAGNOSIS — E1140 Type 2 diabetes mellitus with diabetic neuropathy, unspecified: Secondary | ICD-10-CM | POA: Diagnosis not present

## 2023-07-31 NOTE — Therapy (Signed)
 OUTPATIENT SPEECH LANGUAGE PATHOLOGY TREATMENT   Patient Name: [name] [name] MRN: [id number] DOB:[birth year], 78 y.o., male Today's Date: 07/31/2023  PCP: Donley Furth, MD REFERRING PROVIDER: Epifanio Haste, MD  END OF SESSION:  End of Session - 07/31/23 1229     Visit Number 10    Number of Visits 25    Authorization Type [employer]    SLP Start Time 1230    SLP Stop Time  1315    SLP Time Calculation (min) 45 min    Activity Tolerance Patient tolerated treatment well              Past Medical History:  Diagnosis Date   CAD (coronary artery disease)    Constipation    Diabetes mellitus without complication (HCC)    borderline/ pre diabetic   NSTEMI (non-ST elevated myocardial infarction) (HCC)    at [place]   Prostate cancer (HCC)    Stroke (HCC)    L side - 40 years ago   Past Surgical History:  Procedure Laterality Date   CARDIAC CATHETERIZATION     CATARACT EXTRACTION Bilateral    05/2021; 07/2021   CORONARY ARTERY BYPASS GRAFT     at [facility]   Patient Active Problem List   Diagnosis Date Noted   Type 2 diabetes mellitus with diabetic neuropathy, unspecified (HCC) 06/01/2023   Chronic systolic CHF (congestive heart failure), NYHA class 2 (HCC) 06/01/2023   Left femoral vein DVT (HCC) 05/22/2023   Abnormal echocardiogram 05/22/2023   DNR (do not resuscitate) 05/22/2023   Acute CVA (cerebrovascular accident) (HCC) 05/21/2023   Current chronic use of inhaled steroid 05/21/2023   Prostate cancer (HCC) 10/14/2022   Constipation 10/14/2022   Depression 10/14/2022   Elevated PSA, greater than or equal to 20 ng/ml 07/14/2022   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   Neuropathic pain of hand, right 10/21/2021   Syncope 10/07/2021   Stroke (HCC) 10/07/2021   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 (HCC)    Acute ischemic stroke (HCC) 10/06/2021   CAD (coronary artery disease) 10/06/2021   PVD (peripheral vascular disease) (HCC)  04/15/2019   Dyslipidemia, goal LDL below 70 04/15/2019   History of CVA with residual deficit 04/15/2019   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  Pt accompanied by: significant other [name]  PERTINENT HISTORY: [name] [name] is a 78 yo male presenting to ED 2/10 with confusion and dysarthria. MRI Brain shows acute infarct in the L middle frontal gyrus as well as remote pontine infarcts.   PAIN: Are you having pain? No                                                                                                   TREATMENT DATE:   07/31/23: [name] enters explaining weekend, holiday and concerns re: his granddaughter and pro's  and con's of eating bread, making 2-3 points with cohesion. They endorse they would like to continue working on sentence formation, both verbal and written. Targeted verbal and written sentence generation generating sentences with 2 given words. [name] required usual mod questioning and phrase completion to generate 5 sentences - improvement today in using functor words "the"  and plural "s" and improved spelling with extended time, with less cueing required (occasional min) to correct spelling and grammar errors. Drue benefits from verbal cues to recall the sentence he generated verbally when writing the sentence. [name] and [place] report  07/24/23: Targeted written expression at simple sentence level generating sentences with multiple meaning words - [name] verbally generates grammatically correct sentences with mod I, however written sentences require consistent verbal and visual cues to ID errors. Grammatic markers - consistently adding "s" to singular verbs, writing "the" "an" and "a" before  nouns. He ID's spelling errors with occasional min A however he requires frequent mod written and next letter cues to correct errors.  07/19/23: Targeted written expression, sentence generation and grammar generating simple sentence with 2 given words with rare min A 10 sentences - when writing the sentence, [name] required usual mod A to ID words and grammar markers he left out and to self correct errors. Targeted cohesive narrative generating 3 pros and cons for controversial current events - with visual cues (topic and numbers) he generated 3 pros's for DOGE and ICE with rare min A and required 1 questioning cues for clarification and 2 verbal cues to remain on topic and generate 2 cons fo reach topic. Homework provided unscramble sentences and generating sentences with 2 given words   07/17/23: Byrant drove to car part store and communicate the parts and type of tires he needed. He continues to require effort to spell and type when doing on line searches. Targeted word finding and written expression generating list of 15 words re: car racing - rare min semantic cues to generate 15 words, occasional min verbal cues for accurate written expression - he ID errors with rare min. Auditory comprehension in instructions and conversation - he endorses improved auditory comprehension. To target sentence generation, word finding  Scientist, product/process development (VNeST) was utilized. The pt generated 3 subjects and objects for 2 verbs (split, chase), for a total of 6 subject objects. Pt required occasional min cues. Pt generated 2 complex sentences by answering "wh" questions. Pt required rare min cues to generate complex sentences.   06/26/23: Targeted sentence generation generating 2-3 sentences with words that have multiple meanings - With min extended time, [name] generated 10 sentences with accurate grammar with rare min A. Targeted written expression at word level in simple divergent naming/writing (fruit)  - [name] named and wrote 8 fruits with usual mod A to ID and correct errors. He benefits from cues to spell aloud prior to writing. Even with copy cues, [name] occasionally required multiple cues to ID that he copied wrong. In conversation targeting cohesive narrative, he benefits from visual cues of topic written down and his ideas/points written down to maintain topic.   3/12/25Jorge Newcomer missed last session due to not feeling well. Targeted written expression of name and  address  -Dennise required extended time, however he Id'd and self corrected errors with mod I. Written expression targeting car parts for ordering parts on line - Hensley required frequent mod verbal cues to correctly write and self correct errors - he generated 5 words. Targeted verbal compensations for aphasia generating 3  similarities and differences for 3 sets of related words - Keaghan required frequent questioning cues to generate a total of 9 similarities and differences. Targeted verbal compensations for aphasia generating 3 descriptions/clues for a high frequency word using sematic feature analysis with usual questioning cues. HW is to write list of 15 car part words without help, then to check words with [name].  06/14/23: Jorge Newcomer and [name] report improved communication with friends and family. He feels he is forming his thoughts better.  To target word finding and sentence generation  Scientist, product/process development (VNeST) was utilized. The pt generated 3 subjects and objects for 3 verbs (measure, push, deliver), for a total of 9 subject objects. Pt required occasional min to mod semantic and clarification cues. Pt generated 3 complex sentences by answering "wh" questions. Pt required occasional min  cues to generate complex sentences. Targeted persuasive language generating 3 opinions re: POTUS - using visual aid with Topic on top and numbers 1-3, Rigel generated 3 salient opinions and gave 1-2 reasons for each with  occasional questioning cues for clarification and 2 verbal cues as well as visual to ID tangential and vague language. Targeted word finding and sentence formulation generating 4 safety improvements in racing again using visual organization cues to remain on topic with 2 clarification questions and verbal cues to ID tangential vague language   06/12/23: Endorsed ongoing word finding difficulty. Exhibited adequate ability to converse in conversation with some prompting required to clarify vague language. Conducted ongoing trials of VNeST. Pt wrote answers with usual assistance required for spelling and error awareness. Appropriately named 2/3 who & what components. Usual prompting required to ID errors for final who/what components. Able to correct response with mod questioning cues. Answered when/what/why with rare min A. SLP assisted intermittently with syntax components. Read sentences with 75% accuracy, as pt stated unrelated response with initial starter. Self-corrected with prompt.   06/07/23: Targeted word finding naming 15 items re: car parts with occasional min A. Targeted auditory comprehension generating 8 strategies to support auditory comprehension. Spouse reports Travell get frustrated and snaps at her when she is talking too much. Education re: allowing more time to process and extra time to answer questions as well as saying 1 direction at a time. See Patient Instructions for other strategies. Modeled slow rate, pausing and allowing extra time to respond for spouse. Pt endorsed this is helpful. In conversation re: his prior profession, Kamaron benefits from limiting interruptions when he is conversing, and allowing for extended pauses for word finding without communication partner "jumping in." Educated and modeled for spouse.  To target word finding, grammar and complex sentence Audiological scientist (VNeST) was utilized. The pt generated 3 subjects and objects for 1 verb  (drive), for a total of 3 subject objects. Pt required occasional semantic and questioning cues and extended time cues. Pt generated 1 complex sentences by answering "wh" questions. Pt required occasional min verbal cues to generate complex sentences.    05/31/23 (eval): Evaluation completed, reviewed goals, aphasia homework provided    PATIENT EDUCATION: Education details: See Patient Instructions, See Today's Treatment, Compensations for aphasia and congition Person educated: Patient and Spouse Education method: Explanation, Demonstration, Verbal cues, and Handouts Education comprehension: verbalized understanding and needs further education   GOALS: Goals reviewed with patient? Yes  SHORT TERM GOALS: Target date: 06/28/23  Pt will name 15 personally relevant items in a category with rare min A Baseline: Goal status:MET  2.  Pt will  write biographical information self correcting errors with occasional min A Baseline:  Goal status: MET  3.  Pt & caregiver will carryover 2 compensatory strategies to support auditory comprehension in conversation over 1 week Baseline:  Goal status: MET  4.  Pt will generate grammatically complex mildly complex sentences in structured language task such as VNeST with occasional min A Baseline:  Goal status: MET  5.  Pt will use verbal compensations for aphasia in structured naming task 4/5 opportunities with occasional min A Baseline:  Goal status: PARTIALLY MET  6.  Pt will write names of tools and car parts, self correcting as needed, to facilitate ordering of car parts on the internet Baseline:  Goal status: MET  LONG TERM GOALS: Target date: 08/23/23  Pt will self correct agrammitism in conversation 4/5 opportunities with usual min A Baseline:  Goal status:MET  2.  Pt will write or respond to emails at simple sentence level self correcting aphasic errors with occasional min A Baseline:  Goal status: ONGOING  3.  Pt will use  external aid/calendar to manage his appointments and schedule/to do list with occasional min A from spouse Baseline:  Goal status: MET  4.  Pt will use verbal compensations for aphasia in simple conversation with occasional min A Baseline:  Goal status: ONGOING  5.  Pt will explain a personally relevant topic with cohesion to require 2 or less requests for clarification  Baseline:  Goal status: MET  6. Write a sentence level correcting errors with rare min A              Goal Status: added 10th visit  ASSESSMENT:  CLINICAL IMPRESSION: Patient is a 78 y.o. male who was seen today for mild cognitive impairments and moderate aphasia. Elvie continues to make progress improving word finding and reducing agrammatism in narrative discourse. He reports improved confidence communicating. He has reduced empty, vague and tangential discourse -[name] agrees this is improved.  Mat is using verbal compensations for word finding in conversation with rare min A. He continues to require mod A for grammar and spelling when writing at phrase level. Darelle endorses concern re: sentence generation as well as written expression.  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    OBJECTIVE IMPAIRMENTS: include attention, memory, executive functioning, and aphasia. These impairments are limiting patient from managing medications, managing appointments, and effectively communicating at home and in community. Factors affecting potential to achieve goals and functional outcome are previous level of function.. Patient will benefit from skilled SLP services to address above impairments and improve overall function.  REHAB POTENTIAL: Good  PLAN:  SLP FREQUENCY: 2x/week  SLP DURATION: 12 weeks  PLANNED INTERVENTIONS: Language facilitation, Environmental controls, Cueing hierachy, Cognitive reorganization, and  Internal/external aids   Speech Therapy Progress Note  Dates of Reporting Period: 05/31/23 to 07/31/23  Objective Reports of Subjective Statement: Improved verbal communication with resolved agrammatism.   Objective Measurements: See goals and treatment notes  Goal Update: continue goals  Plan: conitnue POC  Reason Skilled Services are Required: Difficulty generating complex sentences, written expression remains impaired at phrase level affecting Merric's ability to complete online searches for car parts and responding to texts/emails.    Brendan Gadson, Dareen Ebbing, CCC-SLP 07/31/2023, 3:40 PM

## 2023-07-31 NOTE — Patient Instructions (Signed)
 Medication Instructions:  No changes *If you need a refill on your cardiac medications before your next appointment, please call your pharmacy*  Lab Work: In may when you see Primary doctor we would like for you to have fasting lipid panel and Lft's  If you have labs (blood work) drawn today and your tests are completely normal, you will receive your results only by: MyChart Message (if you have MyChart) OR A paper copy in the mail If you have any lab test that is abnormal or we need to change your treatment, we will call you to review the results.  Testing/Procedures: No testing  Follow-Up: At [facility], you and your health needs are our priority.  As part of our continuing mission to provide you with exceptional heart care, our providers are all part of one team.  This team includes your primary Cardiologist (physician) and Advanced Practice Providers or APPs (Physician Assistants and Nurse Practitioners) who all work together to provide you with the care you need, when you need it.  Your next appointment:   As scheduled   We recommend signing up for the patient portal called "MyChart".  Sign up information is provided on this After Visit Summary.  MyChart is used to connect with patients for Virtual Visits (Telemedicine).  Patients are able to view lab/test results, encounter notes, upcoming appointments, etc.  Non-urgent messages can be sent to your provider as well.   To learn more about what you can do with MyChart, go to [email or web address].   Other Instructions:   1st Floor: - Lobby - Registration  - Pharmacy  - Lab - Cafe  2nd Floor: - PV Lab - Diagnostic Testing (echo, CT, nuclear med)  3rd Floor: - Vacant  4th Floor: - TCTS (cardiothoracic surgery) - AFib Clinic - Structural Heart Clinic - Vascular Surgery  - Vascular Ultrasound  5th Floor: - [facility] (general and EP) - Clinical Pharmacy for coumadin, hypertension, lipid,  weight-loss medications, and med management appointments    Valet parking services will be available as well.

## 2023-08-01 ENCOUNTER — Encounter: Payer: Self-pay | Admitting: Cardiology

## 2023-08-02 ENCOUNTER — Ambulatory Visit: Admitting: Speech Pathology

## 2023-08-02 DIAGNOSIS — R4701 Aphasia: Secondary | ICD-10-CM

## 2023-08-02 DIAGNOSIS — R41841 Cognitive communication deficit: Secondary | ICD-10-CM | POA: Diagnosis not present

## 2023-08-02 NOTE — Patient Instructions (Signed)
   Keep practicing some persuasion/debate topics going  Keep practicing writing on your sheets  Suella Emmer job talking to a variety of people

## 2023-08-02 NOTE — Therapy (Signed)
 OUTPATIENT SPEECH LANGUAGE PATHOLOGY TREATMENT   Patient Name: [name] [name] MRN: [id number] DOB:1945/09/21, 78 y.o., male Today's Date: 08/02/2023  PCP: Donley Furth, MD REFERRING PROVIDER: Epifanio Haste, MD  END OF SESSION:  End of Session - 08/02/23 1227     Visit Number 11    Number of Visits 25    Date for SLP Re-Evaluation 08/23/23    Authorization Type [employer]    SLP Start Time 1230    SLP Stop Time  1315    SLP Time Calculation (min) 45 min    Activity Tolerance Patient tolerated treatment well              Past Medical History:  Diagnosis Date   CAD (coronary artery disease)    Constipation    Diabetes mellitus without complication (HCC)    borderline/ pre diabetic   NSTEMI (non-ST elevated myocardial infarction) (HCC)    at [place]   Prostate cancer (HCC)    Stroke (HCC)    L side - 40 years ago   Past Surgical History:  Procedure Laterality Date   CARDIAC CATHETERIZATION     CATARACT EXTRACTION Bilateral    05/2021; 07/2021   CORONARY ARTERY BYPASS GRAFT     at [facility]   Patient Active Problem List   Diagnosis Date Noted   Type 2 diabetes mellitus with diabetic neuropathy, unspecified (HCC) 06/01/2023   Chronic systolic CHF (congestive heart failure), NYHA class 2 (HCC) 06/01/2023   Left femoral vein DVT (HCC) 05/22/2023   Abnormal echocardiogram 05/22/2023   DNR (do not resuscitate) 05/22/2023   Acute CVA (cerebrovascular accident) (HCC) 05/21/2023   Current chronic use of inhaled steroid 05/21/2023   Prostate cancer (HCC) 10/14/2022   Constipation 10/14/2022   Depression 10/14/2022   Elevated PSA, greater than or equal to 20 ng/ml 07/14/2022   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   Neuropathic pain of hand, right 10/21/2021   Syncope 10/07/2021   Stroke (HCC) 10/07/2021   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 (HCC)    Acute ischemic stroke (HCC) 10/06/2021   CAD (coronary artery disease) 10/06/2021   PVD  (peripheral vascular disease) (HCC) 04/15/2019   Dyslipidemia, goal LDL below 70 04/15/2019   History of CVA with residual deficit 04/15/2019   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 MRI Brain shows acute infarct in the L middle frontal gyrus as well as remote pontine infarcts.   PAIN: Are you having pain? No                                                                                                   TREATMENT DATE:   08/02/23: [name] returns sentence unscramble completed correctly.  He continues to report improved verbal communication. He states his cousin noted improvements in his communication and [name] [name] successfully gave directions to Curator on how to repair his wife's classic race car. On a scale of 10, with 10 being normal, before the stroke, [name] [name] rates his speech a 3 after the stroke and a 7-8 now. He is using compensations for aphasia with occasional min A from spouse at home. Today, targeted complex conversation and carryover of compensations for aphasia with controversial topics of space travel and racism. [name] [name] generated 4-5 opinions for each topic and explained his positions with rare min questioning cues for clarification and he used compensations for aphasia as needed.  He will continue to practice writing at sentence level at home. D/C ST, patient is in agreement  07/31/23: [name] [name] enters explaining weekend, holiday and concerns re: his granddaughter and pro's and con's of eating bread, making 2-3 points with cohesion. They endorse they would like to continue working on  sentence formation, both verbal and written. Targeted verbal and written sentence generation generating sentences with 2 given words. [name] required usual mod questioning and phrase completion to generate 5 sentences - improvement today in using functor words "the"  and plural "s" and improved spelling with extended time, with less cueing required (occasional min) to correct spelling and grammar errors. [name] benefits from verbal cues to recall the sentence he generated verbally when writing the sentence. [name] [name] and [name] [name] report  07/24/23: Targeted written expression at simple sentence level generating sentences with multiple meaning words - [name] verbally generates grammatically correct sentences with mod I, however written sentences require consistent verbal and visual cues to ID errors. Grammatic markers - consistently adding "s" to singular verbs, writing "the" "an" and "a" before nouns. He ID's spelling errors with occasional min A however he requires frequent mod written and next letter cues to correct errors.  07/19/23: Targeted written expression, sentence generation and grammar generating simple sentence with 2 given words with rare min A 10 sentences - when writing the sentence, [name] [name] required usual mod A to ID words and grammar markers he left out and to self correct errors. Targeted cohesive narrative generating 3 pros and cons for controversial current events - with visual cues (topic and numbers) he generated 3 pros's for DOGE and ICE with rare min A and required 1 questioning cues for clarification and 2 verbal cues to remain on topic and generate 2 cons fo reach topic. Homework provided unscramble sentences and generating sentences with 2 given words   07/17/23: [name] [name] drove to car part store and communicate the parts and type of tires he needed. He continues to require effort to spell and type when doing on line searches. Targeted word finding and written expression generating list of 15  words re: car racing - rare min semantic cues to generate 15 words, occasional min verbal cues for accurate written expression - he ID errors with rare min. Auditory comprehension in instructions and conversation - he endorses improved auditory comprehension. To target sentence generation, word finding  Scientist, product/process development (VNeST) was utilized. The pt generated 3 subjects and objects for 2 verbs (split, chase), for a total of 6 subject objects. Pt required occasional min cues. Pt generated 2 complex sentences by answering "wh" questions. Pt required rare min cues to generate complex sentences.   06/26/23: Targeted sentence generation generating 2-3 sentences with words that have multiple meanings - With min extended time, [name] [name] generated 10 sentences with accurate grammar with rare  min A. Targeted written expression at word level in simple divergent naming/writing (fruit) - [name] [name] named and wrote 8 fruits with usual mod A to ID and correct errors. He benefits from cues to spell aloud prior to writing. Even with copy cues, [name] [name] occasionally required multiple cues to ID that he copied wrong. In conversation targeting cohesive narrative, he benefits from visual cues of topic written down and his ideas/points written down to maintain topic.   3/12/25Jorge [name] missed last session due to not feeling well. Targeted written expression of name and  address  -Rollo required extended time, however he Id'd and self corrected errors with mod I. Written expression targeting car parts for ordering parts on line - Milind required frequent mod verbal cues to correctly write and self correct errors - he generated 5 words. Targeted verbal compensations for aphasia generating 3 similarities and differences for 3 sets of related words - [name] [name] required frequent questioning cues to generate a total of 9 similarities and differences. Targeted verbal compensations for aphasia generating 3 descriptions/clues  for a high frequency word using sematic feature analysis with usual questioning cues. HW is to write list of 15 car part words without help, then to check words with [name] [name].  06/14/23: [name] [name] and [name] [name] report improved communication with friends and family. He feels he is forming his thoughts better.  To target word finding and sentence generation  Scientist, product/process development (VNeST) was utilized. The pt generated 3 subjects and objects for 3 verbs (measure, push, deliver), for a total of 9 subject objects. Pt required occasional min to mod semantic and clarification cues. Pt generated 3 complex sentences by answering "wh" questions. Pt required occasional min  cues to generate complex sentences. Targeted persuasive language generating 3 opinions re: POTUS - using visual aid with Topic on top and numbers 1-3, [name] [name] generated 3 salient opinions and gave 1-2 reasons for each with occasional questioning cues for clarification and 2 verbal cues as well as visual to ID tangential and vague language. Targeted word finding and sentence formulation generating 4 safety improvements in racing again using visual organization cues to remain on topic with 2 clarification questions and verbal cues to ID tangential vague language   06/12/23: Endorsed ongoing word finding difficulty. Exhibited adequate ability to converse in conversation with some prompting required to clarify vague language. Conducted ongoing trials of VNeST. Pt wrote answers with usual assistance required for spelling and error awareness. Appropriately named 2/3 who & what components. Usual prompting required to ID errors for final who/what components. Able to correct response with mod questioning cues. Answered when/what/why with rare min A. SLP assisted intermittently with syntax components. Read sentences with 75% accuracy, as pt stated unrelated response with initial starter. Self-corrected with prompt.   06/07/23: Targeted word finding naming 15  items re: car parts with occasional min A. Targeted auditory comprehension generating 8 strategies to support auditory comprehension. Spouse reports [name] [name] get frustrated and snaps at her when she is talking too much. Education re: allowing more time to process and extra time to answer questions as well as saying 1 direction at a time. See Patient Instructions for other strategies. Modeled slow rate, pausing and allowing extra time to respond for spouse. Pt endorsed this is helpful. In conversation re: his prior profession, [name] [name] benefits from limiting interruptions when he is conversing, and allowing for extended pauses for word finding without communication partner "jumping in." Educated and modeled for spouse.  To target word finding, grammar and complex  sentence Audiological scientist (VNeST) was utilized. The pt generated 3 subjects and objects for 1 verb (drive), for a total of 3 subject objects. Pt required occasional semantic and questioning cues and extended time cues. Pt generated 1 complex sentences by answering "wh" questions. Pt required occasional min verbal cues to generate complex sentences.    05/31/23 (eval): Evaluation completed, reviewed goals, aphasia homework provided    PATIENT EDUCATION: Education details: See Patient Instructions, See Today's Treatment, Compensations for aphasia and congition Person educated: Patient and Spouse Education method: Explanation, Demonstration, Verbal cues, and Handouts Education comprehension: verbalized understanding and needs further education   GOALS: Goals reviewed with patient? Yes  SHORT TERM GOALS: Target date: 06/28/23  Pt will name 15 personally relevant items in a category with rare min A Baseline: Goal status:MET  2.  Pt will write biographical information self correcting errors with occasional min A Baseline:  Goal status: MET  3.  Pt & caregiver will carryover 2 compensatory strategies to support  auditory comprehension in conversation over 1 week Baseline:  Goal status: MET  4.  Pt will generate grammatically complex mildly complex sentences in structured language task such as VNeST with occasional min A Baseline:  Goal status: MET  5.  Pt will use verbal compensations for aphasia in structured naming task 4/5 opportunities with occasional min A Baseline:  Goal status: PARTIALLY MET  6.  Pt will write names of tools and car parts, self correcting as needed, to facilitate ordering of car parts on the internet Baseline:  Goal status: MET  LONG TERM GOALS: Target date: 08/23/23  Pt will self correct agrammitism in conversation 4/5 opportunities with usual min A Baseline:  Goal status:MET  2.  Pt will write or respond to emails at simple sentence level self correcting aphasic errors with occasional min A Baseline:  Goal status: DEFERRED - did not do emails prior  3.  Pt will use external aid/calendar to manage his appointments and schedule/to do list with occasional min A from spouse Baseline:  Goal status: MET  4.  Pt will use verbal compensations for aphasia in simple conversation with occasional min A Baseline:  Goal status: MET  5.  Pt will explain a personally relevant topic with cohesion to require 2 or less requests for clarification  Baseline:  Goal status: MET  6. Write a sentence level correcting errors with rare min A              Goal Status: PARTIALLY MET  ASSESSMENT:  CLINICAL IMPRESSION: Patient is a 78 y.o. male who was seen today for mild cognitive impairments and moderate aphasia. [name] [name] continues to make progress improving word finding and reducing agrammatism in narrative discourse. He reports improved confidence communicating. He has reduced empty, vague and tangential discourse -[name] [name] agrees this is improved.  [name] [name] is using verbal compensations for word finding in conversation with rare min A. He continues to require min A for grammar and  spelling when writing at phrase level. Goals met, pt is pleased with current level of verbal expression. D/C ST at this time, patient is in agreement.   OBJECTIVE IMPAIRMENTS: include attention, memory, executive functioning, and aphasia. These impairments are limiting patient from managing medications, managing appointments, and effectively communicating at home and in community. Factors affecting potential to achieve goals and functional outcome are previous level of function.. Patient will benefit from skilled SLP services to address above impairments and improve overall function.  REHAB POTENTIAL: Good  PLAN:  SLP FREQUENCY: 2x/week  SLP DURATION: 12 weeks  PLANNED INTERVENTIONS: Language facilitation, Environmental controls, Cueing hierachy, Cognitive reorganization, and Internal/external aids  SPEECH THERAPY DISCHARGE SUMMARY  Visits from Start of Care: 11  Current functional level related to goals / functional outcomes: See goals above   Remaining deficits: Mild high level word finding; written expression at moderately complex sentence level   Education / Equipment: Compensations for aphasia and cognition, HEP for aphasia   Patient agrees to discharge. Patient goals were met. Patient is being discharged due to meeting the stated rehab goals..     [name] [name] Ann, CCC-SLP 08/02/2023, 3:13 PM

## 2023-08-15 ENCOUNTER — Encounter: Payer: Self-pay | Admitting: Family Medicine

## 2023-08-15 ENCOUNTER — Ambulatory Visit: Admitting: Family Medicine

## 2023-08-15 VITALS — BP 102/60 | HR 80 | Temp 98.6°F | Wt 162.4 lb

## 2023-08-15 DIAGNOSIS — Z7984 Long term (current) use of oral hypoglycemic drugs: Secondary | ICD-10-CM | POA: Diagnosis not present

## 2023-08-15 DIAGNOSIS — E1140 Type 2 diabetes mellitus with diabetic neuropathy, unspecified: Secondary | ICD-10-CM

## 2023-08-15 DIAGNOSIS — E785 Hyperlipidemia, unspecified: Secondary | ICD-10-CM

## 2023-08-15 LAB — POCT GLYCOSYLATED HEMOGLOBIN (HGB A1C): Hemoglobin A1C: 7.3 % — AB (ref 4.0–5.6)

## 2023-08-15 LAB — LIPID PANEL
Cholesterol: 229 mg/dL — ABNORMAL HIGH (ref 0–200)
HDL: 48.9 mg/dL (ref >39.00)
LDL Cholesterol: 156 mg/dL — ABNORMAL HIGH (ref 0–99)
NonHDL: 179.88
Total CHOL/HDL Ratio: 5
Triglycerides: 118 mg/dL (ref 0.0–149.0)
VLDL: 23.6 mg/dL (ref 0.0–40.0)

## 2023-08-15 LAB — HEPATIC FUNCTION PANEL
ALT: 14 U/L (ref 0–53)
AST: 21 U/L (ref 0–37)
Albumin: 4.5 g/dL (ref 3.5–5.2)
Alkaline Phosphatase: 66 U/L (ref 39–117)
Bilirubin, Direct: 0.1 mg/dL (ref 0.0–0.3)
Total Bilirubin: 0.6 mg/dL (ref 0.2–1.2)
Total Protein: 7.6 g/dL (ref 6.0–8.3)

## 2023-08-15 NOTE — Progress Notes (Signed)
   Subjective:    Patient ID: [name] [name], male    DOB: 17-Aug-1945, 78 y.o.   MRN: [id number]  HPI Here to follow up on diabetes. He has been working hard on his diet. He feels well. His A1c today is 7.3% (down from 11.5 in February).    Review of Systems  Constitutional: Negative.   Respiratory: Negative.    Cardiovascular: Negative.        Objective:   Physical Exam Constitutional:      Appearance: Normal appearance.  Cardiovascular:     Rate and Rhythm: Normal rate and regular rhythm.     Pulses: Normal pulses.     Heart sounds: Normal heart sounds.  Pulmonary:     Effort: Pulmonary effort is normal.     Breath sounds: Normal breath sounds.  Neurological:     Mental Status: He is alert.           Assessment & Plan:  His type 2 diabetes is under much better control. He will keep taking the Metformin  and Jardiance . Repeat an A1c in 90 days.  Corita Diego, MD

## 2023-08-18 ENCOUNTER — Telehealth: Payer: Self-pay

## 2023-08-18 MED ORDER — ATORVASTATIN CALCIUM 80 MG PO TABS: 80.0000 mg | ORAL_TABLET | Freq: Every day | ORAL | 3 refills | Status: AC

## 2023-08-18 NOTE — Progress Notes (Signed)
   08/18/2023  Patient ID: [name] [name], male   DOB: 13-Sep-1945, 78 y.o.   MRN: [id number]  Pharmacy Quality Measure Review  This patient is appearing on a report for being at risk of failing the adherence measure for cholesterol (statin) medications this calendar year.   Medication: Atorvastatin  40mg  Last fill date: 05/23/23 for 30 day supply  Chart review shows PCP ordered dose increase to lipitor 80mg  #90, 3 RF at last visit on 08/15/23. Pharmacy had not received order yet, resending rx. Patient aware.   Carnell Christian, PharmD Clinical Pharmacist [phone]

## 2023-08-22 ENCOUNTER — Ambulatory Visit: Payer: Self-pay

## 2023-08-22 ENCOUNTER — Ambulatory Visit: Admitting: Family Medicine

## 2023-08-23 ENCOUNTER — Ambulatory Visit: Payer: Self-pay

## 2023-08-23 ENCOUNTER — Ambulatory Visit: Admitting: Family Medicine

## 2023-08-23 ENCOUNTER — Encounter: Payer: Self-pay | Admitting: Family Medicine

## 2023-08-23 VITALS — BP 138/70 | HR 65 | Temp 98.4°F | Resp 16 | Ht 65.0 in | Wt 164.6 lb

## 2023-08-23 DIAGNOSIS — R030 Elevated blood-pressure reading, without diagnosis of hypertension: Secondary | ICD-10-CM | POA: Diagnosis not present

## 2023-08-23 DIAGNOSIS — I6350 Cerebral infarction due to unspecified occlusion or stenosis of unspecified cerebral artery: Secondary | ICD-10-CM

## 2023-08-23 DIAGNOSIS — R2689 Other abnormalities of gait and mobility: Secondary | ICD-10-CM | POA: Diagnosis not present

## 2023-08-23 NOTE — Progress Notes (Signed)
 ACUTE VISIT Chief Complaint  Patient presents with   Medical Management of Chronic Issues   Hypertension   HPI: [name] is a 78 y.o. male with a PMHx significant for PVD, DVT, chronic systolic CHF, CVA, DM II, prostate cancer, and dyslipidemia, among some, who is here today with his wife complaining of elevated BP.   Patient states he was checking his BP at home today and it was 145/83. He checked again shortly afterwards and it was 153/84. Normally his BP readings are 100s/60s.  He follows a low salt diet.  He took a tylenol  this morning even though he was not having any pain. Mentions he has been stressed recently due to politics, the passing of one of his close friends, and his granddaughter's medical problems.  Denies any headache, visual changes, chest pain, SOB, palpitations, difficulty swallowing, or speech changes. He denies taking OTC allergy medication, decongestant, or new supplements.  Patient also complains of problems with "equilibrium" for awhile and wonders if his medications are causing this problem.  Hospitalized in 05/2023 for CVA, when he presented to the ED because dizziness, has had balance issues since then.   Brain MRI 05/21/23: 1. Acute/subacute infarct of the left middle frontal gyrus. 2. Remote encephalomalacia of the left superior frontal gyrus and left parietal lobe. 3. 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 4. Periventricular and subcortical T2 hyperintensities bilaterally are otherwise mildly advanced for age. This likely reflects the sequela of chronic microvascular ischemia. He is on atorvastatin  80 mg daily, Eliquis  5 mg twice daily, and Plavix  75 mg daily.  Prostate cancer with bone metastasis, follows with oncology regularly.  He is on prednisone  5 mg daily and Zytiga  250 mg 4 tablets daily.  He has appointments with oncology and cardiology coming up in 09/2023.   Review of Systems  Constitutional:  Negative for activity  change, appetite change and fever.  HENT:  Negative for sore throat.   Respiratory:  Negative for cough and wheezing.   Gastrointestinal:  Negative for abdominal pain, nausea and vomiting.  Genitourinary:  Negative for decreased urine volume and dysuria.  Musculoskeletal:  Positive for gait problem.  Skin:  Negative for rash.  Neurological:  Negative for syncope and facial asymmetry.  See other pertinent positives and negatives in HPI.  Current Outpatient Medications on File Prior to Visit  Medication Sig Dispense Refill   abiraterone  acetate (ZYTIGA ) 250 MG tablet Take 4 tablets (1,000 mg total) by mouth daily. Take on an empty stomach 1 hour before or 2 hours after a meal (Patient taking differently: Take 250 mg by mouth daily. Take on an empty stomach 1 hour before or 2 hours after a meal. Pt takes 1 tablet daily) 4 tablet 0   acetaminophen  (TYLENOL ) 500 MG tablet Take 1 tablet (500 mg total) by mouth every 6 (six) hours as needed. 30 tablet 0   apixaban  (ELIQUIS ) 5 MG TABS tablet Take 1 tablet (5 mg total) by mouth 2 (two) times daily. 180 tablet 3   atorvastatin  (LIPITOR) 80 MG tablet Take 1 tablet (80 mg total) by mouth daily. 90 tablet 3   calcium  carbonate (TUMS EX) 750 MG chewable tablet Chew 0.5 tablets by mouth daily.     clopidogrel  (PLAVIX ) 75 MG tablet Take 1 tablet (75 mg total) by mouth daily. 30 tablet 0   empagliflozin  (JARDIANCE ) 10 MG TABS tablet Take 1 tablet (10 mg total) by mouth daily before breakfast. 90 tablet 3   gabapentin  (NEURONTIN )  100 MG capsule Take 1 capsule (100 mg total) by mouth at bedtime. 30 capsule 5   metFORMIN  (GLUCOPHAGE ) 1000 MG tablet Take 1 tablet (1,000 mg total) by mouth 2 (two) times daily with a meal. 60 tablet 5   polyethylene glycol powder (GLYCOLAX /MIRALAX ) 17 GM/SCOOP powder Take 17 g by mouth in the morning and at bedtime. 17 g 0   predniSONE  (DELTASONE ) 5 MG tablet Take 1 tablet (5 mg total) by mouth daily with breakfast. 1 tablet 0    pantoprazole  (PROTONIX ) 40 MG tablet Take 1 tablet (40 mg total) by mouth daily. 90 tablet 0   No current facility-administered medications on file prior to visit.    Past Medical History:  Diagnosis Date   CAD (coronary artery disease)    Constipation    Diabetes mellitus without complication (HCC)    borderline/ pre diabetic   NSTEMI (non-ST elevated myocardial infarction) (HCC)    at [place]   Prostate cancer (HCC)    Stroke (HCC)    L side - 40 years ago   No Known Allergies  Social History   Socioeconomic History   Marital status: Married    Spouse name: Not on file   Number of children: 2   Years of education: Not on file   Highest education level: Master's degree (e.g., MA, MS, MEng, MEd, MSW, MBA)  Occupational History   Occupation: retired  Tobacco Use   Smoking status: Former    Current packs/day: 0.00    Types: Cigarettes    Quit date: 04/11/1968    Years since quitting: 55.4   Smokeless tobacco: Never   Tobacco comments:    quit in the 1970s  Vaping Use   Vaping status: Never Used  Substance and Sexual Activity   Alcohol use: Not Currently    Comment: Rare   Drug use: No   Sexual activity: Not on file  Other Topics Concern   Not on file  Social History Narrative   Right handed   One story home   Drinks no caffeine   Social Drivers of Health   Financial Resource Strain: Low Risk  (10/11/2022)   Overall Financial Resource Strain (CARDIA)    Difficulty of Paying Living Expenses: Not hard at all  Food Insecurity: Patient Declined (05/22/2023)   Hunger Vital Sign    Worried About Running Out of Food in the Last Year: Patient declined    Ran Out of Food in the Last Year: Patient declined  Transportation Needs: Patient Declined (05/22/2023)   PRAPARE - Administrator, Civil Service (Medical): Patient declined    Lack of Transportation (Non-Medical): Patient declined  Physical Activity: Unknown (10/11/2022)   Exercise Vital Sign    Days of  Exercise per Week: 5 days    Minutes of Exercise per Session: Patient declined  Stress: No Stress Concern Present (10/11/2022)   [employer] of Occupational Health - Occupational Stress Questionnaire    Feeling of Stress : Only a little  Social Connections: Patient Declined (05/22/2023)   Social Connection and Isolation Panel [NHANES]    Frequency of Communication with Friends and Family: Patient declined    Frequency of Social Gatherings with Friends and Family: Patient declined    Attends Religious Services: Patient declined    Active Member of Clubs or Organizations: Patient declined    Attends Banker Meetings: Patient declined    Marital Status: Patient declined    Vitals:   08/23/23 1439 08/23/23 1742  BP: (!) 140/76 138/70  Pulse: 65   Resp: 16   Temp: 98.4 F (36.9 C)   SpO2: 97%    Body mass index is 27.39 kg/m.  Physical Exam Vitals and nursing note reviewed.  Constitutional:      General: He is not in acute distress.    Appearance: He is well-developed.  HENT:     Head: Normocephalic and atraumatic.     Mouth/Throat:     Mouth: Mucous membranes are moist.     Pharynx: Uvula midline.  Eyes:     Conjunctiva/sclera: Conjunctivae normal.  Cardiovascular:     Rate and Rhythm: Normal rate and regular rhythm.     Heart sounds: No murmur heard.    Comments: DP palpable. Pulmonary:     Effort: Pulmonary effort is normal. No respiratory distress.     Breath sounds: Normal breath sounds.  Abdominal:     Palpations: Abdomen is soft. There is no mass.     Tenderness: There is no abdominal tenderness.  Musculoskeletal:     Right lower leg: No edema.     Left lower leg: No edema.  Skin:    General: Skin is warm.     Findings: No erythema or rash.  Neurological:     Mental Status: He is alert and oriented to person, place, and time.     Comments: Mildly unstable gait/limp, not assisted.  Psychiatric:        Mood and Affect: Mood and affect normal.    ASSESSMENT AND PLAN:  Mr. Anchondo was seen today for elevated BP.   Elevated blood pressure reading We rechecked BP today during visit and gradually going down, last  one before leaving 130/70. He has had BPs on the lower normal range, so at this time I am not recommending antihypertensive medication. Recommend monitoring BP a.m. and p.m. and bring BP readings to his next follow-up visit. He has an appointment with his cardiologist on 09/15/2023. Continue low-salt diet.  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 Currently on Eliquis  5 mg twice daily, Plavix  75 mg daily, and atorvastatin  80 mg daily.  Recommend continuing all these medications. He has an appointment with neurologist on 11/15/2023. Balance problem Problem seems to be chronic. Some of his chronic comorbidities can aggravate problem, history of peripheral neuropathy and PVD as well as CVA.  We reviewed his medications and some can certainly aggravate problem but given his medical hx benefit at this time is greater than the risk. Adequate hydration. I do not think imaging is needed at this time. We discussed fall precautions. Follow-up with PCP if problem gets worse. Instructed about warning signs.  I spent a total of 36 minutes in both face to face and non face to face activities for this visit on the date of this encounter. During this time history was obtained and documented, examination was performed, prior labs/imaging reviewed, and assessment/plan discussed.  Return in about 3 months (around 11/23/2023) for HTN with PCP.  I, [name] [name], acting as a scribe for [name] Elmore [place], MD., have documented all relevant documentation on the behalf of [name] Pendley [place], MD, as directed by  [name] Arthurs [place], MD while in the presence of [name] Klemens [place], MD.   I, [name] Chaput [place], MD, have reviewed all documentation for this visit. The documentation on 08/23/23 for the exam, diagnosis, procedures, and orders  are all accurate and complete.  [name] Schmierer G. [place], MD  [facility]. [place] office.

## 2023-08-23 NOTE — Telephone Encounter (Signed)
 Noted.

## 2023-08-23 NOTE — Telephone Encounter (Signed)
 Copied from CRM [id number]. Topic: Clinical - Red Word Triage >> Aug 23, 2023 12:24 PM Alysia Jumbo S wrote: [facility] that prompted transfer to Nurse Triage: light head, bp elevated at 145/83, 150/84, 150/91  Chief Complaint: elevated bp Symptoms: woozy, lightheaded Frequency: constant Pertinent Negatives: Patient denies numbness, tingling, headache,pain Disposition: [] ED /[] Urgent Care (no appt availability in office) / [x] Appointment(In office/virtual)/ []  [facility]/ [] Home Care/ [] Refused Recommended Disposition /[] [facility]/ []  Follow-up with PCP Additional Notes: apt made today per protocol ; care advice given, denies questions; instructed to go to ER if becomes worse.   Reason for Disposition  [1] Systolic BP  >= 130 OR Diastolic >= 80 AND [2] not taking BP medications  Answer Assessment - Initial Assessment Questions 1. BLOOD PRESSURE: "What is the blood pressure?" "Did you take at least two measurements 5 minutes apart?"     150/84, 145/83, 150/91 2. ONSET: "When did you take your blood pressure?"     This am 3. HOW: "How did you take your blood pressure?" (e.g., automatic home BP monitor, visiting nurse)     Automatic cuff 4. HISTORY: "Do you have a history of high blood pressure?"     denies 5. MEDICINES: "Are you taking any medicines for blood pressure?" "Have you missed any doses recently?"     denies 6. OTHER SYMPTOMS: "Do you have any symptoms?" (e.g., blurred vision, chest pain, difficulty breathing, headache, weakness)     Lightheaded, woozy 7. PREGNANCY: "Is there any chance you are pregnant?" "When was your last menstrual period?"     na  Protocols used: Blood Pressure - High-A-AH

## 2023-08-23 NOTE — Patient Instructions (Addendum)
 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 Continue all your medications. Fall precautions.  If you need refills for medications you take chronically, please call your pharmacy. Do not use My Chart to request refills or for acute issues that need immediate attention. If you send a my chart message, it may take a few days to be addressed, specially if I am not in the office.  Please be sure medication list is accurate. If a new problem present, please set up appointment sooner than planned today.

## 2023-08-31 ENCOUNTER — Ambulatory Visit: Admitting: Podiatry

## 2023-09-05 NOTE — Progress Notes (Signed)
 HPI: FU coronary artery disease. Patient was previously in [place]  and had a non-ST elevation myocardial infarction. He ultimately underwent coronary artery bypass graft in November 2017 with a LIMA to the LAD, saphenous vein graft to the first marginal and saphenous vein graft to the distal right coronary artery. Abdominal ultrasound March 2018 showed no aneurysm.  ABIs December 2021 normal on the right and moderate on the left.  Had CVA 2/25. Lower extremity venous Dopplers February 2025 showed acute DVT on the left.  Echocardiogram February 2025 showed hypokinesis of the distal anteroseptal wall and apex with ejection fraction 45 to 50%, grade 1 diastolic dysfunction.  Follow-up monitor April 2025 showed sinus rhythm with 5 and 6 beats of nonsustained ventricular tachycardia.  Since last seen, denies dyspnea, chest pain, palpitations or syncope.  Current Outpatient Medications  Medication Sig Dispense Refill   abiraterone  acetate (ZYTIGA ) 250 MG tablet Take 4 tablets (1,000 mg total) by mouth daily. Take on an empty stomach 1 hour before or 2 hours after a meal (Patient taking differently: Take 250 mg by mouth daily. Take on an empty stomach 1 hour before or 2 hours after a meal. Pt takes 1 tablet daily) 4 tablet 0   acetaminophen  (TYLENOL ) 500 MG tablet Take 1 tablet (500 mg total) by mouth every 6 (six) hours as needed. 30 tablet 0   apixaban  (ELIQUIS ) 5 MG TABS tablet Take 1 tablet (5 mg total) by mouth 2 (two) times daily. 180 tablet 3   atorvastatin  (LIPITOR) 80 MG tablet Take 1 tablet (80 mg total) by mouth daily. 90 tablet 3   calcium  carbonate (TUMS EX) 750 MG chewable tablet Chew 0.5 tablets by mouth daily.     clopidogrel  (PLAVIX ) 75 MG tablet Take 1 tablet (75 mg total) by mouth daily. 30 tablet 0   empagliflozin  (JARDIANCE ) 10 MG TABS tablet Take 1 tablet (10 mg total) by mouth daily before breakfast. 90 tablet 3   gabapentin  (NEURONTIN ) 100 MG capsule Take 1 capsule (100  mg total) by mouth at bedtime. 30 capsule 5   metFORMIN  (GLUCOPHAGE ) 1000 MG tablet Take 1 tablet (1,000 mg total) by mouth 2 (two) times daily with a meal. 60 tablet 5   polyethylene glycol powder (GLYCOLAX /MIRALAX ) 17 GM/SCOOP powder Take 17 g by mouth in the morning and at bedtime. 17 g 0   predniSONE  (DELTASONE ) 5 MG tablet Take 1 tablet (5 mg total) by mouth daily with breakfast. 1 tablet 0   pantoprazole  (PROTONIX ) 40 MG tablet Take 1 tablet (40 mg total) by mouth daily. 90 tablet 0   No current facility-administered medications for this visit.     Past Medical History:  Diagnosis Date   CAD (coronary artery disease)    Constipation    Diabetes mellitus without complication (HCC)    borderline/ pre diabetic   NSTEMI (non-ST elevated myocardial infarction) (HCC)    at [place]   Prostate cancer (HCC)    Stroke (HCC)    L side - 40 years ago    Past Surgical History:  Procedure Laterality Date   CARDIAC CATHETERIZATION     CATARACT EXTRACTION Bilateral    05/2021; 07/2021   CORONARY ARTERY BYPASS GRAFT     at [facility]    Social History   Socioeconomic History   Marital status: Married    Spouse name: Not on file   Number of children: 2   Years of education: Not on file   Highest education level:  Master's degree (e.g., MA, MS, MEng, MEd, MSW, MBA)  Occupational History   Occupation: retired  Tobacco Use   Smoking status: Former    Current packs/day: 0.00    Types: Cigarettes    Quit date: 04/11/1968    Years since quitting: 55.4   Smokeless tobacco: Never   Tobacco comments:    quit in the 1970s  Vaping Use   Vaping status: Never Used  Substance and Sexual Activity   Alcohol use: Not Currently    Comment: Rare   Drug use: No   Sexual activity: Not on file  Other Topics Concern   Not on file  Social History Narrative   Right handed   One story home   Drinks no caffeine   Social Drivers of Health   Financial Resource Strain: Low Risk  (10/11/2022)    Overall Financial Resource Strain (CARDIA)    Difficulty of Paying Living Expenses: Not hard at all  Food Insecurity: Patient Declined (05/22/2023)   Hunger Vital Sign    Worried About Running Out of Food in the Last Year: Patient declined    Ran Out of Food in the Last Year: Patient declined  Transportation Needs: Patient Declined (05/22/2023)   PRAPARE - Administrator, Civil Service (Medical): Patient declined    Lack of Transportation (Non-Medical): Patient declined  Physical Activity: Unknown (10/11/2022)   Exercise Vital Sign    Days of Exercise per Week: 5 days    Minutes of Exercise per Session: Patient declined  Stress: No Stress Concern Present (10/11/2022)   [employer] of Occupational Health - Occupational Stress Questionnaire    Feeling of Stress : Only a little  Social Connections: Patient Declined (05/22/2023)   Social Connection and Isolation Panel [NHANES]    Frequency of Communication with Friends and Family: Patient declined    Frequency of Social Gatherings with Friends and Family: Patient declined    Attends Religious Services: Patient declined    Database administrator or Organizations: Patient declined    Attends Banker Meetings: Patient declined    Marital Status: Patient declined  Intimate Partner Violence: Patient Declined (05/22/2023)   Humiliation, Afraid, Rape, and Kick questionnaire    Fear of Current or Ex-Partner: Patient declined    Emotionally Abused: Patient declined    Physically Abused: Patient declined    Sexually Abused: Patient declined    Family History  Problem Relation Age of Onset   Dementia Mother        lived to 101 years   Liver disease Father    Diabetes Sister    Cirrhosis Brother    Cancer - Colon Neg Hx    Colon polyps Neg Hx    Rectal cancer Neg Hx    Esophageal cancer Neg Hx    Stomach cancer Neg Hx    Pancreatic cancer Neg Hx     ROS: no fevers or chills, productive cough, hemoptysis,  dysphasia, odynophagia, melena, hematochezia, dysuria, hematuria, rash, seizure activity, orthopnea, PND, pedal edema, claudication. Remaining systems are negative.  Physical Exam: Well-developed well-nourished in no acute distress.  Skin is warm and dry.  HEENT is normal.  Neck is supple.  Chest is clear to auscultation with normal expansion.  Cardiovascular exam is regular rate and rhythm.  Abdominal exam nontender or distended. No masses palpated. Extremities show no edema. neuro grossly intact  A/P  1 coronary artery disease status post coronary bypass graft-continue plavix  and statin.  LV function mildly  reduced on most recent echocardiogram.  Add Toprol 25 mg daily.  2 recent DVT-continue apixaban .  3 hyperlipidemia-continue statin.  Check lipids and liver in 6 weeks.  4 prior CVA-continue Plavix .  5 peripheral vascular disease-continue aspirin  and statin.  6 Hypertension-BP controlled; continue present meds and follow.  7 Metastatic prostate cancer-Per oncology.  Alexandria Angel, MD

## 2023-09-06 ENCOUNTER — Telehealth: Payer: Self-pay | Admitting: *Deleted

## 2023-09-06 NOTE — Telephone Encounter (Signed)
 Patient was identified as falling into the [place] Measure - Diabetes.   Patient was: Left voicemail to schedule with primary care provider.

## 2023-09-06 NOTE — Telephone Encounter (Signed)
 Spoke with wife and scheduled an appointment

## 2023-09-06 NOTE — Telephone Encounter (Signed)
 Patient wife returned the call she would like a call back

## 2023-09-11 ENCOUNTER — Encounter: Payer: Self-pay | Admitting: Family Medicine

## 2023-09-12 ENCOUNTER — Other Ambulatory Visit: Payer: Self-pay | Admitting: Hematology and Oncology

## 2023-09-12 DIAGNOSIS — C61 Malignant neoplasm of prostate: Secondary | ICD-10-CM

## 2023-09-13 ENCOUNTER — Inpatient Hospital Stay (HOSPITAL_BASED_OUTPATIENT_CLINIC_OR_DEPARTMENT_OTHER): Admitting: Physician Assistant

## 2023-09-13 ENCOUNTER — Other Ambulatory Visit: Payer: Self-pay

## 2023-09-13 ENCOUNTER — Inpatient Hospital Stay: Attending: Hematology and Oncology

## 2023-09-13 ENCOUNTER — Inpatient Hospital Stay

## 2023-09-13 VITALS — BP 123/60 | HR 63 | Temp 97.7°F | Resp 18 | Ht 65.0 in | Wt 161.9 lb

## 2023-09-13 DIAGNOSIS — C61 Malignant neoplasm of prostate: Secondary | ICD-10-CM | POA: Insufficient documentation

## 2023-09-13 DIAGNOSIS — C7951 Secondary malignant neoplasm of bone: Secondary | ICD-10-CM | POA: Insufficient documentation

## 2023-09-13 DIAGNOSIS — Z5111 Encounter for antineoplastic chemotherapy: Secondary | ICD-10-CM | POA: Insufficient documentation

## 2023-09-13 DIAGNOSIS — E1140 Type 2 diabetes mellitus with diabetic neuropathy, unspecified: Secondary | ICD-10-CM | POA: Insufficient documentation

## 2023-09-13 LAB — CMP (CANCER CENTER ONLY)
ALT: 12 U/L (ref 0–44)
AST: 17 U/L (ref 15–41)
Albumin: 4.3 g/dL (ref 3.5–5.0)
Alkaline Phosphatase: 70 U/L (ref 38–126)
Anion gap: 7 (ref 5–15)
BUN: 21 mg/dL (ref 8–23)
CO2: 29 mmol/L (ref 22–32)
Calcium: 9.6 mg/dL (ref 8.9–10.3)
Chloride: 105 mmol/L (ref 98–111)
Creatinine: 0.91 mg/dL (ref 0.61–1.24)
GFR, Estimated: >60 mL/min (ref >60)
Glucose, Bld: 169 mg/dL — ABNORMAL HIGH (ref 70–99)
Potassium: 3.9 mmol/L (ref 3.5–5.1)
Sodium: 141 mmol/L (ref 135–145)
Total Bilirubin: 0.5 mg/dL (ref 0.0–1.2)
Total Protein: 7.3 g/dL (ref 6.5–8.1)

## 2023-09-13 LAB — CBC WITH DIFFERENTIAL (CANCER CENTER ONLY)
Abs Immature Granulocytes: 0.01 10*3/uL (ref 0.00–0.07)
Basophils Absolute: 0 10*3/uL (ref 0.0–0.1)
Basophils Relative: 1 %
Eosinophils Absolute: 0.3 10*3/uL (ref 0.0–0.5)
Eosinophils Relative: 4 %
HCT: 44 % (ref 39.0–52.0)
Hemoglobin: 15.2 g/dL (ref 13.0–17.0)
Immature Granulocytes: 0 %
Lymphocytes Relative: 22 %
Lymphs Abs: 1.9 10*3/uL (ref 0.7–4.0)
MCH: 34 pg (ref 26.0–34.0)
MCHC: 34.5 g/dL (ref 30.0–36.0)
MCV: 98.4 fL (ref 80.0–100.0)
Monocytes Absolute: 0.5 10*3/uL (ref 0.1–1.0)
Monocytes Relative: 5 %
Neutro Abs: 5.9 10*3/uL (ref 1.7–7.7)
Neutrophils Relative %: 68 %
Platelet Count: 285 10*3/uL (ref 150–400)
RBC: 4.47 MIL/uL (ref 4.22–5.81)
RDW: 12.7 % (ref 11.5–15.5)
WBC Count: 8.7 10*3/uL (ref 4.0–10.5)
nRBC: 0 % (ref 0.0–0.2)

## 2023-09-13 LAB — VITAMIN B12: Vitamin B-12: 355 pg/mL (ref 180–914)

## 2023-09-13 MED ORDER — LEUPROLIDE ACETATE (3 MONTH) 22.5 MG ~~LOC~~ KIT
22.5000 mg | PACK | Freq: Once | SUBCUTANEOUS | Status: AC
  Administered 2023-09-13: 22.5 mg via SUBCUTANEOUS

## 2023-09-13 NOTE — Progress Notes (Signed)
 [facility] [phone]   [phone]  PROGRESS NOTE  Patient Care Team: Donley Furth, MD as PCP - General (Family Medicine) Audery Blazing Deannie Fabian, MD as PCP - Cardiology (Cardiology) Merriam Abbey, DO as Consulting Physician (Neurology) Katheleen Palmer, RN as Oncology Nurse Navigator  Hematological/Oncological History # Metastatic Prostate Cancer  06/21/2022: PSA 87.3 07/20/2022: RP lymphadenopathy, nodular prostate and 2 pulmonary nodules.  09/26/2022: TRUS biopsy showed adenocarcinoma of the prostate, Gleason Score 5+4 =9  10/27/2022: NM PSMA scan showed metastatic spread to lymph nodes/bones  10/2022: started Eligard  45 mg subcutaneous and Abiraterone  1000 mg PO daily with prednisone  5 mg PO daily.  11/16/2022: establish care with Dr. Rosaline Coma  05/16/2023: Transitioned to Zytiga  250 mg p.o. daily with prednisone  5 mg p.o. daily.  Interval History:  [name] [name] 78 y.o. male with medical history significant for metastatic castrate sensitive prostate cancer who presents for a follow up visit. The patient's last visit was on 06/21/2023. In the interim since the last visit he is continued on Zytiga  therapy as prescribed.  On exam today, [name] [name] is accompanied by his wife.  He reports his energy levels are overall stable. He is able to complete his ADLS on his own. He has a good appetite without any weight changes. He denies nausea, vomiting or bowel habit changes. He takes miralax  as needed. He has ongoing lower extremity neuropathy which does impact his balance. He adds that the neuropathy inteferes with sleep. He tried gabapentin  100 mg PO nightly with no improvement and increased drowsiness. He denies easy bruising or signs of bleeding. Otherwise, he is not having any major side effects as a result of his treatment and is willing and able to proceed at this time. He denies fevers, chills, sweats, shortness of breath, chest pain, cough, nausea, vomiting or diarrhea. He  has no other complaints. Full 10 point ROS is otherwise negative.  MEDICAL HISTORY:  Past Medical History:  Diagnosis Date   CAD (coronary artery disease)    Constipation    Diabetes mellitus without complication (HCC)    borderline/ pre diabetic   NSTEMI (non-ST elevated myocardial infarction) (HCC)    at [place]   Prostate cancer (HCC)    Stroke (HCC)    L side - 40 years ago    SURGICAL HISTORY: Past Surgical History:  Procedure Laterality Date   CARDIAC CATHETERIZATION     CATARACT EXTRACTION Bilateral    05/2021; 07/2021   CORONARY ARTERY BYPASS GRAFT     at [facility]    SOCIAL HISTORY: Social History   Socioeconomic History   Marital status: Married    Spouse name: Not on file   Number of children: 2   Years of education: Not on file   Highest education level: Master's degree (e.g., MA, MS, MEng, MEd, MSW, MBA)  Occupational History   Occupation: retired  Tobacco Use   Smoking status: Former    Current packs/day: 0.00    Types: Cigarettes    Quit date: 04/11/1968    Years since quitting: 55.4   Smokeless tobacco: Never   Tobacco comments:    quit in the 1970s  Vaping Use   Vaping status: Never Used  Substance and Sexual Activity   Alcohol use: Not Currently    Comment: Rare   Drug use: No   Sexual activity: Not on file  Other Topics Concern   Not on file  Social History Narrative   Right handed   One story  home   Drinks no caffeine   Social Drivers of Corporate investment banker Strain: Low Risk  (10/11/2022)   Overall Financial Resource Strain (CARDIA)    Difficulty of Paying Living Expenses: Not hard at all  Food Insecurity: Patient Declined (05/22/2023)   Hunger Vital Sign    Worried About Running Out of Food in the Last Year: Patient declined    Ran Out of Food in the Last Year: Patient declined  Transportation Needs: Patient Declined (05/22/2023)   PRAPARE - Administrator, Civil Service (Medical): Patient declined    Lack of  Transportation (Non-Medical): Patient declined  Physical Activity: Unknown (10/11/2022)   Exercise Vital Sign    Days of Exercise per Week: 5 days    Minutes of Exercise per Session: Patient declined  Stress: No Stress Concern Present (10/11/2022)   [employer] of Occupational Health - Occupational Stress Questionnaire    Feeling of Stress : Only a little  Social Connections: Patient Declined (05/22/2023)   Social Connection and Isolation Panel [NHANES]    Frequency of Communication with Friends and Family: Patient declined    Frequency of Social Gatherings with Friends and Family: Patient declined    Attends Religious Services: Patient declined    Database administrator or Organizations: Patient declined    Attends Banker Meetings: Patient declined    Marital Status: Patient declined  Intimate Partner Violence: Patient Declined (05/22/2023)   Humiliation, Afraid, Rape, and Kick questionnaire    Fear of Current or Ex-Partner: Patient declined    Emotionally Abused: Patient declined    Physically Abused: Patient declined    Sexually Abused: Patient declined    FAMILY HISTORY: Family History  Problem Relation Age of Onset   Dementia Mother        lived to 101 years   Liver disease Father    Diabetes Sister    Cirrhosis Brother    Cancer - Colon Neg Hx    Colon polyps Neg Hx    Rectal cancer Neg Hx    Esophageal cancer Neg Hx    Stomach cancer Neg Hx    Pancreatic cancer Neg Hx     ALLERGIES:  has no known allergies.  MEDICATIONS:  Current Outpatient Medications  Medication Sig Dispense Refill   abiraterone  acetate (ZYTIGA ) 250 MG tablet Take 4 tablets (1,000 mg total) by mouth daily. Take on an empty stomach 1 hour before or 2 hours after a meal (Patient taking differently: Take 250 mg by mouth daily. Take on an empty stomach 1 hour before or 2 hours after a meal. Pt takes 1 tablet daily) 4 tablet 0   acetaminophen  (TYLENOL ) 500 MG tablet Take 1 tablet (500  mg total) by mouth every 6 (six) hours as needed. 30 tablet 0   apixaban  (ELIQUIS ) 5 MG TABS tablet Take 1 tablet (5 mg total) by mouth 2 (two) times daily. 180 tablet 3   atorvastatin  (LIPITOR) 80 MG tablet Take 1 tablet (80 mg total) by mouth daily. 90 tablet 3   calcium  carbonate (TUMS EX) 750 MG chewable tablet Chew 0.5 tablets by mouth daily.     clopidogrel  (PLAVIX ) 75 MG tablet Take 1 tablet (75 mg total) by mouth daily. 30 tablet 0   empagliflozin  (JARDIANCE ) 10 MG TABS tablet Take 1 tablet (10 mg total) by mouth daily before breakfast. 90 tablet 3   gabapentin  (NEURONTIN ) 100 MG capsule Take 1 capsule (100 mg total) by mouth at bedtime.  30 capsule 5   metFORMIN  (GLUCOPHAGE ) 1000 MG tablet Take 1 tablet (1,000 mg total) by mouth 2 (two) times daily with a meal. 60 tablet 5   polyethylene glycol powder (GLYCOLAX /MIRALAX ) 17 GM/SCOOP powder Take 17 g by mouth in the morning and at bedtime. 17 g 0   predniSONE  (DELTASONE ) 5 MG tablet Take 1 tablet (5 mg total) by mouth daily with breakfast. 1 tablet 0   pantoprazole  (PROTONIX ) 40 MG tablet Take 1 tablet (40 mg total) by mouth daily. 90 tablet 0   No current facility-administered medications for this visit.   Facility-Administered Medications Ordered in Other Visits  Medication Dose Route Frequency Provider Last Rate Last Admin   Leuprolide  Acetate (3 Month) (ELIGARD ) 22.5 MG injection 22.5 mg  22.5 mg Subcutaneous Once Dorsey, John T IV, MD        REVIEW OF SYSTEMS:   Constitutional: ( - ) fevers, ( - )  chills , ( - ) night sweats Eyes: ( - ) blurriness of vision, ( - ) double vision, ( - ) watery eyes Ears, nose, mouth, throat, and face: ( - ) mucositis, ( - ) sore throat Respiratory: ( - ) cough, ( - ) dyspnea, ( - ) wheezes Cardiovascular: ( - ) palpitation, ( - ) chest discomfort, ( - ) lower extremity swelling Gastrointestinal:  ( - ) nausea, ( - ) heartburn, ( - ) change in bowel habits Skin: ( - ) abnormal skin  rashes Lymphatics: ( - ) new lymphadenopathy, ( - ) easy bruising Neurological: ( - ) numbness, ( - ) tingling, ( - ) new weaknesses Behavioral/Psych: ( - ) mood change, ( - ) new changes  All other systems were reviewed with the patient and are negative.  PHYSICAL EXAMINATION:  Vitals:   09/13/23 1303 09/13/23 1304  BP: 136/73 123/60  Pulse: 63   Resp: 18   Temp: 97.7 F (36.5 C)   SpO2: 96%    Filed Weights   09/13/23 1303  Weight: 161 lb 14.4 oz (73.4 kg)    GENERAL: Well-appearing elderly African-American male, alert, no distress and comfortable SKIN: skin color, texture, turgor are normal, no rashes or significant lesions EYES: conjunctiva are pink and non-injected, sclera clear LUNGS: clear to auscultation and percussion with normal breathing effort HEART: regular rate & rhythm and no murmurs and no lower extremity edema Musculoskeletal: no cyanosis of digits and no clubbing  PSYCH: alert & oriented x 3, fluent speech NEURO: no focal motor/sensory deficits  LABORATORY DATA:  I have reviewed the data as listed    Latest Ref Rng & Units 09/13/2023   12:15 PM 06/21/2023   12:40 PM 05/22/2023   10:24 AM  CBC  WBC 4.0 - 10.5 K/uL 8.7  9.5  9.9   Hemoglobin 13.0 - 17.0 g/dL 65.7  84.6  96.2   Hematocrit 39.0 - 52.0 % 44.0  47.4  41.5   Platelets 150 - 400 K/uL 285  292  308        Latest Ref Rng & Units 09/13/2023   12:15 PM 08/15/2023    1:34 PM 06/21/2023   12:40 PM  CMP  Glucose 70 - 99 mg/dL 952   841   BUN 8 - 23 mg/dL 21   18   Creatinine 3.24 - 1.24 mg/dL 4.01   0.27   Sodium 253 - 145 mmol/L 141   140   Potassium 3.5 - 5.1 mmol/L 3.9   4.3   Chloride  98 - 111 mmol/L 105   104   CO2 22 - 32 mmol/L 29   28   Calcium  8.9 - 10.3 mg/dL 9.6   9.7   Total Protein 6.5 - 8.1 g/dL 7.3  7.6  7.4   Total Bilirubin 0.0 - 1.2 mg/dL 0.5  0.6  0.6   Alkaline Phos 38 - 126 U/L 70  66  65   AST 15 - 41 U/L 17  21  15    ALT 0 - 44 U/L 12  14  11     RADIOGRAPHIC  STUDIES: No results found.  ASSESSMENT & PLAN Yader Lute 78 y.o. male with medical history significant for metastatic castrate sensitive prostate cancer who presents for a follow up visit.   After review of the labs, review of the records, and discussion with the patient the patients findings are most consistent with metastatic adenocarcinoma of the prostate, castrate sensitive.   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 -- Patient started Lupron  and Zytiga  1000 mg p.o. daily with 5 mg prednisone  in late July 2024. -- Diagnosis confirmed with prostate biopsy Gleason 9 with PSMA scan showing widespread metastasis to lymph nodes and bones. PLAN:  --Labs today reviewed. WBC 8.7, hemoglobin 15.2, MCV 98.4, platelets 285, creatinine and LFTs normal. --Last PSA level from 06/21/2023 was <0.1. Today's PSA is pending.  -- Continue Zytiga  250 mg p.o. daily with prednisone  5 mg.   -- RTC in 3 months with labs, follow up visit, Eligard  injection.    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   No orders of the defined types were placed in this encounter.   All questions were answered. The patient knows to call the clinic with any problems, questions or concerns.  A total of more than 30 minutes were spent on this encounter with face-to-face time and non-face-to-face time, including preparing to see the patient, ordering tests and/or medications, counseling the patient and coordination of care as outlined above.   [name] Hearing PA-C Dept of Hematology and Oncology [facility] Phone: [phone]    09/13/2023 1:37 PM

## 2023-09-14 ENCOUNTER — Ambulatory Visit: Payer: Self-pay | Admitting: Physician Assistant

## 2023-09-15 ENCOUNTER — Encounter: Payer: Self-pay | Admitting: Cardiology

## 2023-09-15 ENCOUNTER — Ambulatory Visit: Payer: Medicare PPO | Attending: Cardiology | Admitting: Cardiology

## 2023-09-15 VITALS — BP 122/66 | HR 66 | Ht 65.0 in | Wt 161.6 lb

## 2023-09-15 DIAGNOSIS — I10 Essential (primary) hypertension: Secondary | ICD-10-CM

## 2023-09-15 DIAGNOSIS — E782 Mixed hyperlipidemia: Secondary | ICD-10-CM

## 2023-09-15 DIAGNOSIS — I2510 Atherosclerotic heart disease of native coronary artery without angina pectoris: Secondary | ICD-10-CM | POA: Diagnosis not present

## 2023-09-15 LAB — PROSTATE-SPECIFIC AG, SERUM (LABCORP): Prostate Specific Ag, Serum: <0.1 ng/mL (ref 0.0–4.0)

## 2023-09-15 LAB — TESTOSTERONE: Testosterone: <3 ng/dL — ABNORMAL LOW (ref 264–916)

## 2023-09-15 MED ORDER — METOPROLOL SUCCINATE ER 25 MG PO TB24: 25.0000 mg | ORAL_TABLET | Freq: Every day | ORAL | 3 refills | Status: AC

## 2023-09-15 NOTE — Patient Instructions (Addendum)
 Medication Instructions:   START METOPROLOL SUCC ER 25 MG ONCE DAILY AT BEDTIME  *If you need a refill on your cardiac medications before your next appointment, please call your pharmacy*  Your physician recommends that you return for lab work in: [facility]  Follow-Up: At [facility], you and your health needs are our priority.  As part of our continuing mission to provide you with exceptional heart care, our providers are all part of one team.  This team includes your primary Cardiologist (physician) and Advanced Practice Providers or APPs (Physician Assistants and Nurse Practitioners) who all work together to provide you with the care you need, when you need it.  Your next appointment:   6 month(s)  Provider:   Alexandria Angel, MD

## 2023-09-18 ENCOUNTER — Telehealth: Payer: Self-pay | Admitting: Cardiology

## 2023-09-18 ENCOUNTER — Other Ambulatory Visit: Payer: Self-pay | Admitting: Neurology

## 2023-09-18 MED ORDER — CLOPIDOGREL BISULFATE 75 MG PO TABS: 75.0000 mg | ORAL_TABLET | Freq: Every day | ORAL | 3 refills | Status: AC

## 2023-09-18 NOTE — Telephone Encounter (Signed)
*  STAT* If patient is at the pharmacy, call can be transferred to refill team.   1. Which medications need to be refilled? (please list name of each medication and dose if known)   clopidogrel  (PLAVIX ) 75 MG tablet     4. Which pharmacy/location (including street and city if local pharmacy) is medication to be sent to?  [id number], [place] - [address] Phone: [phone]  Fax: [phone]       5. Do they need a 30 day or 90 day supply? 90  Pt spouse states he is completely out

## 2023-09-19 ENCOUNTER — Telehealth: Payer: Self-pay | Admitting: Cardiology

## 2023-09-19 NOTE — Telephone Encounter (Signed)
 Pt c/o medication issue:  1. Name of Medication:   apixaban  (ELIQUIS ) 5 MG TABS tablet    clopidogrel  (PLAVIX ) 75 MG tablet    2. How are you currently taking this medication (dosage and times per day)? As written  3. Are you having a reaction (difficulty breathing--STAT)? No  4. What is your medication issue? Patient's wife is calling to verify which medication the patient should be taking. Patient's wife stated the patient is out of the Plavix  and when picking it up from the pharmacy it was going to cost over $300. Please advise.

## 2023-09-19 NOTE — Telephone Encounter (Signed)
 Returned call, no dpr on file, verbal permission given to speak with wife. When attempting to refill Xaretlo told it is not covered by insurance.   Called pharmacy- medication covered and can be filled today for $11.65

## 2023-09-21 ENCOUNTER — Ambulatory Visit: Admitting: Podiatry

## 2023-10-05 ENCOUNTER — Encounter: Payer: Self-pay | Admitting: Family Medicine

## 2023-10-06 MED ORDER — FREESTYLE LIBRE 3 READER DEVI: 1.0000 | 11 refills | Status: AC

## 2023-10-06 MED ORDER — FREESTYLE LIBRE 3 SENSOR MISC: 1.0000 | 11 refills | Status: DC

## 2023-10-06 NOTE — Telephone Encounter (Signed)
 Done

## 2023-10-23 ENCOUNTER — Telehealth: Payer: Self-pay

## 2023-10-23 ENCOUNTER — Ambulatory Visit

## 2023-10-23 NOTE — Telephone Encounter (Signed)
 Unsuccessful attempts to reach patient on preferred number listed in notes for scheduled AWV. Left message on voicemail okay to reschedule.

## 2023-11-02 ENCOUNTER — Other Ambulatory Visit: Payer: Self-pay | Admitting: Family Medicine

## 2023-11-02 MED ORDER — FREESTYLE LIBRE 3 SENSOR MISC: 1.0000 | 11 refills | Status: DC

## 2023-11-02 NOTE — Telephone Encounter (Signed)
 Copied from CRM [id number]. Topic: Clinical - Medication Refill >> Nov 02, 2023  1:29 PM [place] J wrote: Medication: Continuous Glucose Sensor (FREESTYLE LIBRE 3 SENSOR) MISC  Has the patient contacted their pharmacy? No (Agent: If no, request that the patient contact the pharmacy for the refill. If patient does not wish to contact the pharmacy document the reason why and proceed with request.) (Agent: If yes, when and what did the pharmacy advise?)  This is the patient's preferred pharmacy:  [phone] [address] [place] - [address] [place] [address] Phone: [phone] Fax: [phone]  Is this the correct pharmacy for this prescription? Yes If no, delete pharmacy and type the correct one.   Has the prescription been filled recently? No  Is the patient out of the medication? Yes  Has the patient been seen for an appointment in the last year OR does the patient have an upcoming appointment? Yes  Can we respond through MyChart? Yes  Agent: Please be advised that Rx refills may take up to 3 business days. We ask that you follow-up with your pharmacy.

## 2023-11-07 NOTE — Telephone Encounter (Signed)
Please call this in to the pharmacy

## 2023-11-08 ENCOUNTER — Ambulatory Visit: Payer: Self-pay

## 2023-11-08 NOTE — Telephone Encounter (Signed)
 FYI Only or Action Required?: Action required by provider: medication refill request and clinical question for provider.  Patient was last seen in primary care on 08/23/2023 by [place], Betty G, MD.  Called Nurse Triage reporting Advice Only.  Symptoms began today.  Interventions attempted: Nothing.  Symptoms are: unchanged.  Triage Disposition: Call PCP Within 24 Hours  Patient/caregiver understands and will follow disposition?:         Copied from CRM #[id number]. Topic: Clinical - Red Word Triage >> Nov 08, 2023  4:39 PM Rea C wrote: Red Word that prompted transfer to Nurse Triage: Device Malfunction  Continuous Glucose Sensor (FREESTYLE LIBRE 3 PLUS SENSOR) fell off. Reason for Disposition  Caller requesting lab results  (Exception: Routine or non-urgent lab result.)    Routing information to PCP for further evaluation.  Answer Assessment - Initial Assessment Questions 1. REASON FOR CALL or QUESTION: What is your reason for calling today? or How can I best     Pt called very frustrated stating he is out of Continuous Glucose Sensor (FREESTYLE LIBRE 3 PLUS SENSOR) - pt states he needs a replacement asap and does not understand what is talking so long to refill medication.  Pt states it is so hot outside and he sweats a lot therfore device comes off.  Pt would like a call back asap to inform of the plan of care to replace this equipment  Protocols used: PCP Call - No Triage-A-AH

## 2023-11-09 ENCOUNTER — Telehealth: Payer: Self-pay

## 2023-11-09 NOTE — Telephone Encounter (Signed)
 Spoke with pt this morning states that he ordered a sensor to replace the one that fell off, provided pt with the Free style Libre 3 phone number for future help.

## 2023-11-09 NOTE — Progress Notes (Signed)
   11/09/2023  Patient ID: [name] [name], male   DOB: 01/29/1946, 78 y.o.   MRN: [id number]  Pharmacy Quality Measure Review  This patient is appearing on a report for being at risk of failing the adherence measure for cholesterol (statin) medications this calendar year.   Medication: Atorvastatin  80mg  Last fill date: 08/18/23 for 90 day supply  Spoke with patient, offered to coordinate refills at the pharmacy. Declines today and says he will order it for next week.  Jon VEAR Lind[place], PharmD Clinical Pharmacist [phone]

## 2023-11-14 NOTE — Progress Notes (Unsigned)
 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  LDL goal less than 70.    - Hgb A1c goal less than 7  - Normotensive blood pressure.  I would avoid hypotension in order to ensure adequate blood flow through the ICA stenosis.  2. Continue PT/Speech therapy 3. For headaches, advised to take Tylenol  4.  Follow up 6 months   Total time spent on today's visit was 20 minutes dedicated to this patient today, preparing to see patient, examining the patient, ordering tests and/or medications and counseling the patient, documenting clinical information in the EHR or other health record, independently interpreting results and communicating results to the patient/family, discussing treatment and goals, answering patient's questions and coordinating care.    Subjective:  [name] [name] is a 78 year old right-handed male with CAD s/p NSTEMI/CABG, DM II, metastatic prostate cancer to the bone and history of stroke who presents for recent stroke.  History supplemented by hospital records.  Accompanied by his  wife.  UPDATE: Current medications:  Eliquis , clopidogrel  75mg , Toprol  XL, atorvastatin  80mg , Jardiance , metformin   He feels like he has too much medication.  He feels dizzy or foggy sometimes, not a spinning sensation.  No falls.  Occasionally may have brief left leg shaking but not frequent.     08/15/2023 LABS:  Hgb A1c 7.3, LDL 156.  Atorvastatin  was increased to 80mg .    HISTORY: History of recurrent strokes.  Patient had a stroke in 1980 presenting with left sided hemiparesis.  No specific cause was identified.  Still had a some residual left foot drop.   I previously saw patient in April 2021 for right arm weakness.  MRI of brain revealed subacute to chronic posterior left MCA territory infarct involving the left parietal cortex.  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  He was advised to continue Plavix .  Echocardiogram was ordered but patient never had performed and was lost to follow up.  He was admitted to [facility] on 10/06/2021 for syncope when he was outside pulling the cord to start the weed eater.  No palpitations..  He had a syncopal episode the previous day as well as a near syncopal earlier in the month which was attributed to dehydration.  CT head showed possible evolving acute left frontal infarct.  MRI of brain confirmed acute left frontal infarct as well as chronic left frontoparietal infarct.  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  2D echocardiogram showed EF 50-55% with hypokinesis of left venticular, apical inferior wall and no interatrial shunt.  LDL was 134 and Hgb A1c 6.4.  Prior to admission, he was alternating everyday between ASA 81mg  and Plavix .  He was discharged on DAPT for 3 months due to high-grade stenosis of left ICA siphon, followed by monotherapy o either ASA or Plavix .   He was also started on Crestor  20mg .  4 week cardiac event monitor in July 2023 was negative for a fib.   Since the stroke in June 2023, he has had intermittent episodes of left leg weakness - He will suddenly feel dizzy, like he may pass out.  If he stands up, his left leg will start dragging.  During an episode, after climbing into bed, his left leg will also start shaking and feels a tingling sensation.  He takes his blood pressure, which is elevated (SBP 150s, usually 110-120).  Lasts about 10 minutes.  No pain but if he puts a heating pad on the leg, it calms it down.  Having a bowel movement also helps.  Frequency varies.  May occur every one to two months.   EEG performed on 05/03/2022 to evaluate episodic leg shaking which was normal.  He did not have the MRI of brain and CTA head and neck performed.  He hasn't had the left leg shaking for a while, so I don't think imaging is indicated at this time.  On 05/23/2023, patient developed acute onset of confusion and dysarthria.  Words weren't coming out right.  Kept repeating same thing over and over.  No new unilateral weakness (still with residual left sided weakness from previous stroke).  Brought to the hospital were MRI revealed acute infarct in the left middle frontal gyrus.  He had been taking ASA and Plavix  on alternating days.  CTA of head and neck revealed high-grade stenoses in proximal V1 segments, moderate stenoses at dural margin of both vertebral arteries, moderate stenoses of cavernous ICAs bilaterally, moderate stenosis in distal left P2 segment.  2D echo showed EF 45-50%, hypokinesis of distal anteroseptal wall and apex with mild LV dysfunction, grade 1 diastolic dysfunction, normal left atrial size, interatrial septum not well-visualized.  Bilateral lower extremity US  found DVT in left common femoral vein and left proximal profunda vein.  LDL 142 and Hgb A1c 11.5.  Discharged on Eliquis  and atorvastatin .  Feels okay.  Still struggles with  getting words out and some understanding.  In PT and speech therapy.  Has not been having the left leg shaking.  Sometimes has occasional mild headache which he takes ASA that is effective.  Statins:  Crestor  and Lipitor both caused dizziness  PAST MEDICAL HISTORY: Past Medical History:  Diagnosis Date   CAD (coronary artery disease)    Constipation    Diabetes mellitus without complication (HCC)    borderline/ pre diabetic   NSTEMI (non-ST elevated myocardial infarction) (HCC)    at [place]   Prostate cancer (HCC)    Stroke (HCC)    L side - 40 years ago    MEDICATIONS: Current Outpatient Medications on File Prior to Visit  Medication Sig Dispense Refill   abiraterone  acetate (ZYTIGA ) 250 MG tablet Take 4 tablets (1,000 mg total) by mouth daily. Take on an empty stomach 1 hour before or 2 hours after a meal (Patient taking differently: Take 250 mg by mouth daily. Take on an empty stomach 1 hour before or 2 hours after a meal. Pt takes 1 tablet daily) 4 tablet 0   acetaminophen  (TYLENOL ) 500 MG tablet Take 1 tablet (500 mg total) by mouth every 6 (six) hours as needed. 30  tablet 0   apixaban  (ELIQUIS ) 5 MG TABS tablet Take 1 tablet (5 mg total) by mouth 2 (two) times daily. 180 tablet 3   atorvastatin  (LIPITOR) 80 MG tablet Take 1 tablet (80 mg total) by mouth daily. 90 tablet 3   calcium  carbonate (TUMS EX) 750 MG chewable tablet Chew 0.5 tablets by mouth daily.     clopidogrel  (PLAVIX ) 75 MG tablet Take 1 tablet (75 mg total) by mouth daily. 30 tablet 0   clopidogrel  (PLAVIX ) 75 MG tablet Take 1 tablet (75 mg total) by mouth daily. 90 tablet 3   Continuous Glucose Receiver (FREESTYLE LIBRE 3 READER) DEVI 1 Application by Does not apply route every 14 (fourteen) days. 1 each 11   Continuous Glucose Sensor (FREESTYLE LIBRE 3 PLUS SENSOR) MISC Apply every 14 days 6 each 3   empagliflozin  (JARDIANCE ) 10 MG TABS tablet Take 1 tablet (10 mg total) by mouth daily before breakfast. 90 tablet  3   gabapentin  (NEURONTIN ) 100 MG capsule Take 1 capsule (100 mg total) by mouth at bedtime. 30 capsule 5   metFORMIN  (GLUCOPHAGE ) 1000 MG tablet Take 1 tablet (1,000 mg total) by mouth 2 (two) times daily with a meal. 60 tablet 5   metoprolol  succinate (TOPROL  XL) 25 MG 24 hr tablet Take 1 tablet (25 mg total) by mouth at bedtime. 90 tablet 3   pantoprazole  (PROTONIX ) 40 MG tablet Take 1 tablet (40 mg total) by mouth daily. 90 tablet 0   polyethylene glycol powder (GLYCOLAX /MIRALAX ) 17 GM/SCOOP powder Take 17 g by mouth in the morning and at bedtime. 17 g 0   predniSONE  (DELTASONE ) 5 MG tablet Take 1 tablet (5 mg total) by mouth daily with breakfast. 1 tablet 0   No current facility-administered medications on file prior to visit.    ALLERGIES: No Known Allergies  FAMILY HISTORY: Family History  Problem Relation Age of Onset   Dementia Mother        lived to 53 years   Liver disease Father    Diabetes Sister    Cirrhosis Brother    Cancer - Colon Neg Hx    Colon polyps Neg Hx    Rectal cancer Neg Hx    Esophageal cancer Neg Hx    Stomach cancer Neg Hx    Pancreatic cancer Neg Hx       Objective:  Blood pressure 131/76, pulse 72, height 5' 5 (1.651 m), weight 163 lb (73.9 kg), SpO2 97%. General: No acute distress.  Patient appears well-groomed.   Head:  Normocephalic/atraumatic Eyes:  Fundi examined but not visualized Neck: supple, no paraspinal tenderness, full range of motion Heart:  Regular rate and rhythm Back: No paraspinal tenderness Neurological Exam: alert and oriented.  Speech fluent and not dysarthric, Some difficulty following commands of finger to nose; otherwise language intact.  CN II-XII intact. Bulk and tone normal, muscle strength 5-/5 left elbow flexion/extension and left knee extension/flexion, otherwise 5/5 throughout.  Sensation to light touch intact.  Deep tendon reflexes 3+ left patellar, otherwise 2+ throughout, toes downgoing.  Finger to nose testing  intact.  Gait with slight limp, Romberg negative.   Juliene Dunnings, DO  CC:  Garnette Olmsted, MD

## 2023-11-14 NOTE — Progress Notes (Deleted)
 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  LDL goal less than 70.    - Hgb A1c goal less than 7  - Normotensive blood pressure.  I would avoid hypotension in order to ensure adequate blood flow through the ICA stenosis.  2. Continue PT/Speech therapy 3. For headaches, advised to take Tylenol  4.  Follow up 6 months   Total time spent on today's visit was 20 minutes dedicated to this patient today, preparing to see patient, examining the patient, ordering tests and/or medications and counseling the patient, documenting clinical information in the EHR or other health record, independently interpreting results and communicating results to the patient/family, discussing treatment and goals, answering patient's questions and coordinating care.    Subjective:  Verlie Hellenbrand is a 78 year old right-handed male with CAD s/p NSTEMI/CABG, DM II, metastatic prostate cancer to the bone and history of stroke who presents for recent stroke.  History supplemented by hospital records.  Accompanied by his  wife.  UPDATE: Current medications:  Eliquis , clopidogrel  75mg , Toprol  XL, atorvastatin  80mg , Jardiance , metformin   He feels like he has too much medication.  He feels dizzy or foggy sometimes, not a spinning sensation.  No falls.  Occasionally may have brief left leg shaking but not frequent.     08/15/2023 LABS:  Hgb A1c 7.3, LDL 156.  Atorvastatin  was increased to 80mg .    HISTORY: History of recurrent strokes.  Patient had a stroke in 1980 presenting with left sided hemiparesis.  No specific cause was identified.  Still had a some residual left foot drop.   I previously saw patient in April 2021 for right arm weakness.  MRI of brain revealed subacute to chronic posterior left MCA territory infarct involving the left parietal cortex.  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  He was advised to continue Plavix .  Echocardiogram was ordered but patient never had performed and was lost to follow up.  He was admitted to [facility] on 10/06/2021 for syncope when he was outside pulling the cord to start the weed eater.  No palpitations..  He had a syncopal episode the previous day as well as a near syncopal earlier in the month which was attributed to dehydration.  CT head showed possible evolving acute left frontal infarct.  MRI of brain confirmed acute left frontal infarct as well as chronic left frontoparietal infarct.  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  2D echocardiogram showed EF 50-55% with hypokinesis of left venticular, apical inferior wall and no interatrial shunt.  LDL was 134 and Hgb A1c 6.4.  Prior to admission, he was alternating everyday between ASA 81mg  and Plavix .  He was discharged on DAPT for 3 months due to high-grade stenosis of left ICA siphon, followed by monotherapy o either ASA or Plavix .   He was also started on Crestor  20mg .  4 week cardiac event monitor in July 2023 was negative for a fib.   Since the stroke in June 2023, he has had intermittent episodes of left leg weakness - He will suddenly feel dizzy, like he may pass out.  If he stands up, his left leg will start dragging.  During an episode, after climbing into bed, his left leg will also start shaking and feels a tingling sensation.  He takes his blood pressure, which is elevated (SBP 150s, usually 110-120).  Lasts about 10 minutes.  No pain but if he puts a heating pad on the leg, it calms it down.  Having a bowel movement also helps.  Frequency varies.  May occur every one to two months.   EEG performed on 05/03/2022 to evaluate episodic leg shaking which was normal.  He did not have the MRI of brain and CTA head and neck performed.  He hasn't had the left leg shaking for a while, so I don't think imaging is indicated at this time.  On 05/23/2023, patient developed acute onset of confusion and dysarthria.  Words weren't coming out right.  Kept repeating same thing over and over.  No new unilateral weakness (still with residual left sided weakness from previous stroke).  Brought to the hospital were MRI revealed acute infarct in the left middle frontal gyrus.  He had been taking ASA and Plavix  on alternating days.  CTA of head and neck revealed high-grade stenoses in proximal V1 segments, moderate stenoses at dural margin of both vertebral arteries, moderate stenoses of cavernous ICAs bilaterally, moderate stenosis in distal left P2 segment.  2D echo showed EF 45-50%, hypokinesis of distal anteroseptal wall and apex with mild LV dysfunction, grade 1 diastolic dysfunction, normal left atrial size, interatrial septum not well-visualized.  Bilateral lower extremity US  found DVT in left common femoral vein and left proximal profunda vein.  LDL 142 and Hgb A1c 11.5.  Discharged on Eliquis  and atorvastatin .  Feels okay.  Still struggles with  getting words out and some understanding.  In PT and speech therapy.  Has not been having the left leg shaking.  Sometimes has occasional mild headache which he takes ASA that is effective.  Statins:  Crestor  and Lipitor both caused dizziness  PAST MEDICAL HISTORY: Past Medical History:  Diagnosis Date   CAD (coronary artery disease)    Constipation    Diabetes mellitus without complication (HCC)    borderline/ pre diabetic   NSTEMI (non-ST elevated myocardial infarction) (HCC)    at [place]   Prostate cancer (HCC)    Stroke (HCC)    L side - 40 years ago    MEDICATIONS: Current Outpatient Medications on File Prior to Visit  Medication Sig Dispense Refill   abiraterone  acetate (ZYTIGA ) 250 MG tablet Take 4 tablets (1,000 mg total) by mouth daily. Take on an empty stomach 1 hour before or 2 hours after a meal (Patient taking differently: Take 250 mg by mouth daily. Take on an empty stomach 1 hour before or 2 hours after a meal. Pt takes 1 tablet daily) 4 tablet 0   acetaminophen  (TYLENOL ) 500 MG tablet Take 1 tablet (500 mg total) by mouth every 6 (six) hours as needed. 30  tablet 0   apixaban  (ELIQUIS ) 5 MG TABS tablet Take 1 tablet (5 mg total) by mouth 2 (two) times daily. 180 tablet 3   atorvastatin  (LIPITOR) 80 MG tablet Take 1 tablet (80 mg total) by mouth daily. 90 tablet 3   calcium  carbonate (TUMS EX) 750 MG chewable tablet Chew 0.5 tablets by mouth daily.     clopidogrel  (PLAVIX ) 75 MG tablet Take 1 tablet (75 mg total) by mouth daily. 30 tablet 0   clopidogrel  (PLAVIX ) 75 MG tablet Take 1 tablet (75 mg total) by mouth daily. 90 tablet 3   Continuous Glucose Receiver (FREESTYLE LIBRE 3 READER) DEVI 1 Application by Does not apply route every 14 (fourteen) days. 1 each 11   Continuous Glucose Sensor (FREESTYLE LIBRE 3 PLUS SENSOR) MISC Apply every 14 days 6 each 3   empagliflozin  (JARDIANCE ) 10 MG TABS tablet Take 1 tablet (10 mg total) by mouth daily before breakfast. 90 tablet  3   gabapentin  (NEURONTIN ) 100 MG capsule Take 1 capsule (100 mg total) by mouth at bedtime. 30 capsule 5   metFORMIN  (GLUCOPHAGE ) 1000 MG tablet Take 1 tablet (1,000 mg total) by mouth 2 (two) times daily with a meal. 60 tablet 5   metoprolol  succinate (TOPROL  XL) 25 MG 24 hr tablet Take 1 tablet (25 mg total) by mouth at bedtime. 90 tablet 3   pantoprazole  (PROTONIX ) 40 MG tablet Take 1 tablet (40 mg total) by mouth daily. 90 tablet 0   polyethylene glycol powder (GLYCOLAX /MIRALAX ) 17 GM/SCOOP powder Take 17 g by mouth in the morning and at bedtime. 17 g 0   predniSONE  (DELTASONE ) 5 MG tablet Take 1 tablet (5 mg total) by mouth daily with breakfast. 1 tablet 0   No current facility-administered medications on file prior to visit.    ALLERGIES: No Known Allergies  FAMILY HISTORY: Family History  Problem Relation Age of Onset   Dementia Mother        lived to 53 years   Liver disease Father    Diabetes Sister    Cirrhosis Brother    Cancer - Colon Neg Hx    Colon polyps Neg Hx    Rectal cancer Neg Hx    Esophageal cancer Neg Hx    Stomach cancer Neg Hx    Pancreatic cancer Neg Hx       Objective:  Blood pressure 131/76, pulse 72, height 5' 5 (1.651 m), weight 163 lb (73.9 kg), SpO2 97%. General: No acute distress.  Patient appears well-groomed.   Head:  Normocephalic/atraumatic Eyes:  Fundi examined but not visualized Neck: supple, no paraspinal tenderness, full range of motion Heart:  Regular rate and rhythm Back: No paraspinal tenderness Neurological Exam: alert and oriented.  Speech fluent and not dysarthric, Some difficulty following commands of finger to nose; otherwise language intact.  CN II-XII intact. Bulk and tone normal, muscle strength 5-/5 left elbow flexion/extension and left knee extension/flexion, otherwise 5/5 throughout.  Sensation to light touch intact.  Deep tendon reflexes 3+ left patellar, otherwise 2+ throughout, toes downgoing.  Finger to nose testing  intact.  Gait with slight limp, Romberg negative.   Juliene Dunnings, DO  CC:  Garnette Olmsted, MD

## 2023-11-15 ENCOUNTER — Encounter: Payer: Self-pay | Admitting: Neurology

## 2023-11-15 ENCOUNTER — Ambulatory Visit: Admitting: Neurology

## 2023-11-15 VITALS — BP 131/76 | HR 72 | Ht 65.0 in | Wt 163.0 lb

## 2023-11-15 DIAGNOSIS — C61 Malignant neoplasm of prostate: Secondary | ICD-10-CM

## 2023-11-15 DIAGNOSIS — I6522 Occlusion and stenosis of left carotid artery: Secondary | ICD-10-CM

## 2023-11-15 DIAGNOSIS — E1140 Type 2 diabetes mellitus with diabetic neuropathy, unspecified: Secondary | ICD-10-CM

## 2023-11-15 DIAGNOSIS — I2510 Atherosclerotic heart disease of native coronary artery without angina pectoris: Secondary | ICD-10-CM

## 2023-11-15 DIAGNOSIS — I63312 Cerebral infarction due to thrombosis of left middle cerebral artery: Secondary | ICD-10-CM

## 2023-11-15 DIAGNOSIS — E785 Hyperlipidemia, unspecified: Secondary | ICD-10-CM | POA: Diagnosis not present

## 2023-11-15 NOTE — Patient Instructions (Signed)
 No change in medications  Mediterranean Diet A Mediterranean diet is based on the traditions of countries on the [employer]. It focuses on eating more: Fruits and vegetables. Whole grains, beans, nuts, and seeds. Heart-healthy fats. These are fats that are good for your heart. It involves eating less: Dairy. Meat and eggs. Processed foods with added sugar, salt, and fat. This type of diet can help prevent certain conditions. It can also improve outcomes if you have a long-term (chronic) disease, such as kidney or heart disease. What are tips for following this plan? Reading food labels Check packaged foods for: The serving size. For foods such as rice and pasta, the serving size is the amount of cooked product, not dry. The total fat. Avoid foods with saturated fat or trans fat. Added sugars, such as corn syrup. Shopping  Try to have a balanced diet. Buy a variety of foods, such as: Fresh fruits and vegetables. You may be able to get these from local farmers markets. You can also buy them frozen. Grains, beans, nuts, and seeds. Some of these can be bought in bulk. Fresh seafood. Poultry and eggs. Low-fat dairy products. Buy whole ingredients instead of foods that have already been packaged. If you can't get fresh seafood, buy precooked frozen shrimp or canned fish, such as tuna, salmon, or sardines. Stock your pantry so you always have certain foods on hand, such as olive oil, canned tuna, canned tomatoes, rice, pasta, and beans. Cooking Cook foods with extra-virgin olive oil instead of using butter or other vegetable oils. Have meat as a side dish. Have vegetables or grains as your main dish. This means having meat in small portions or adding small amounts of meat to foods like pasta or stew. Use beans or vegetables instead of meat in common dishes like chili or lasagna. Try out different cooking methods. Try roasting, broiling, steaming, and sauting vegetables. Add frozen  vegetables to soups, stews, pasta, or rice. Add nuts or seeds for added healthy fats and plant protein at each meal. You can add these to yogurt, salads, or vegetable dishes. Marinate fish or vegetables using olive oil, lemon juice, garlic, and fresh herbs. Meal planning Plan to eat a vegetarian meal one day each week. Try to work up to two vegetarian meals, if possible. Eat seafood two or more times a week. Have healthy snacks on hand. These may include: Vegetable sticks with hummus. Greek yogurt. Fruit and nut trail mix. Eat balanced meals. These should include: Fruit: 2-3 servings a day. Vegetables: 4-5 servings a day. Low-fat dairy: 2 servings a day. Fish, poultry, or lean meat: 1 serving a day. Beans and legumes: 2 or more servings a week. Nuts and seeds: 1-2 servings a day. Whole grains: 6-8 servings a day. Extra-virgin olive oil: 3-4 servings a day. Limit red meat and sweets to just a few servings a month. Lifestyle  Try to cook and eat meals with your family. Drink enough fluid to keep your pee (urine) pale yellow. Be active every day. This includes: Aerobic exercise, which is exercise that causes your heart to beat faster. Examples include running and swimming. Leisure activities like gardening, walking, or housework. Get 7-8 hours of sleep each night. Drink red wine if your provider says you can. A glass of wine is 5 oz (150 mL). You may be allowed to have: Up to 1 glass a day if you're male and not pregnant. Up to 2 glasses a day if you're male. What foods should I  eat? Fruits Apples. Apricots. Avocado. Berries. Bananas. Cherries. Dates. Figs. Grapes. Lemons. Melon. Oranges. Peaches. Plums. Pomegranate. Vegetables Artichokes. Beets. Broccoli. Cabbage. Carrots. Eggplant. Green beans. Chard. Kale. Spinach. Onions. Leeks. Peas. Squash. Tomatoes. Peppers. Radishes. Grains Whole-grain pasta. Brown rice. Bulgur wheat. Polenta. Couscous. Whole-wheat bread. [place] Madeira. Meats and other proteins Beans. Almonds. Sunflower seeds. Pine nuts. Peanuts. Cod. Salmon. Scallops. Shrimp. Tuna. Tilapia. Clams. Oysters. Eggs. Chicken or [place] without skin. Dairy Low-fat milk. Cheese. Greek yogurt. Fats and oils Extra-virgin olive oil. Avocado oil. Grapeseed oil. Beverages Water. Red wine. Herbal tea. Sweets and desserts Greek yogurt with honey. Baked apples. Poached pears. Trail mix. Seasonings and condiments Basil. Cilantro. Coriander. Cumin. Mint. Parsley. Sage. Rosemary. Tarragon. Garlic. Oregano. Thyme. Pepper. Balsamic vinegar. Tahini. Hummus. Tomato sauce. Olives. Mushrooms. The items listed above may not be all the foods and drinks you can have. Talk to a dietitian to learn more. What foods should I limit? This is a list of foods that should be eaten rarely. Fruits Fruit canned in syrup. Vegetables Deep-fried potatoes, like [place] fries. Grains Packaged pasta or rice dishes. Cereal with added sugar. Snacks with added sugar. Meats and other proteins Beef. Pork. Lamb. Chicken or [place] with skin. Hot dogs. [place]. Dairy Ice cream. Sour cream. Whole milk. Fats and oils Butter. Canola oil. Vegetable oil. Beef fat (tallow). Lard. Beverages Juice. Sugar-sweetened soft drinks. Beer. Liquor and spirits. Sweets and desserts Cookies. Cakes. Pies. Candy. Seasonings and condiments Mayonnaise. Pre-made sauces and marinades. The items listed above may not be all the foods and drinks you should limit. Talk to a dietitian to learn more. Where to find more information American Heart Association (AHA): heart.org This information is not intended to replace advice given to you by your health care provider. Make sure you discuss any questions you have with your health care provider. Document Revised: 07/10/2022 Document Reviewed: 07/10/2022 Elsevier Patient Education  2024 [employer].

## 2023-11-21 ENCOUNTER — Encounter: Payer: Self-pay | Admitting: Family Medicine

## 2023-11-21 ENCOUNTER — Ambulatory Visit: Admitting: Family Medicine

## 2023-11-21 VITALS — BP 128/78 | HR 65 | Temp 97.6°F | Wt 164.3 lb

## 2023-11-21 DIAGNOSIS — E1140 Type 2 diabetes mellitus with diabetic neuropathy, unspecified: Secondary | ICD-10-CM | POA: Diagnosis not present

## 2023-11-21 DIAGNOSIS — D1724 Benign lipomatous neoplasm of skin and subcutaneous tissue of left leg: Secondary | ICD-10-CM | POA: Diagnosis not present

## 2023-11-21 DIAGNOSIS — E785 Hyperlipidemia, unspecified: Secondary | ICD-10-CM

## 2023-11-21 NOTE — Progress Notes (Signed)
   Subjective:    Patient ID: Shamari Lofquist, male    DOB: [birth year], 78 y.o.   MRN: [id number]  HPI Here with his wife to check a pain in the back of his left thigh that started 2 weeks ago. He says it usually does not bother him when sitting but if he stands or walks for any length of time the pain begins. No recent trauma. Also we need to check his lipid panel since we increased the Lipitor to 80 mg daily 3 months ago. He is due for an A1c as well.    Review of Systems  Constitutional: Negative.   Respiratory: Negative.    Cardiovascular: Negative.   Musculoskeletal:  Positive for myalgias.       Objective:   Physical Exam Constitutional:      Appearance: Normal appearance.     Comments: Walks with a mild limp  Cardiovascular:     Rate and Rhythm: Normal rate and regular rhythm.     Pulses: Normal pulses.     Heart sounds: Normal heart sounds.  Pulmonary:     Effort: Pulmonary effort is normal.     Breath sounds: Normal breath sounds.  Musculoskeletal:     Comments: There is a 4 cm firm mobile tender lump in the left posterior thigh just beneath the skin   Neurological:     Mental Status: He is alert.           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 He will apply Voltaren gel to the area QID for one week and then report back to us . We will also send him to the lab today for lipids and an A1c. Garnette Olmsted, MD

## 2023-11-22 ENCOUNTER — Ambulatory Visit: Payer: Self-pay | Admitting: Family Medicine

## 2023-11-22 LAB — HEMOGLOBIN A1C: Hgb A1c MFr Bld: 7.9 % — ABNORMAL HIGH (ref 4.6–6.5)

## 2023-11-22 MED ORDER — EMPAGLIFLOZIN 25 MG PO TABS: 25.0000 mg | ORAL_TABLET | Freq: Every day | ORAL | 3 refills | Status: AC

## 2023-11-22 NOTE — Addendum Note (Signed)
 Addended by: JOHNNY SENIOR A on: 11/22/2023 04:51 PM   Modules accepted: Orders

## 2023-12-05 ENCOUNTER — Ambulatory Visit: Payer: Medicare PPO | Admitting: Neurology

## 2023-12-06 ENCOUNTER — Inpatient Hospital Stay (HOSPITAL_BASED_OUTPATIENT_CLINIC_OR_DEPARTMENT_OTHER): Admitting: Hematology and Oncology

## 2023-12-06 ENCOUNTER — Inpatient Hospital Stay

## 2023-12-06 ENCOUNTER — Other Ambulatory Visit: Payer: Self-pay | Admitting: Hematology and Oncology

## 2023-12-06 ENCOUNTER — Inpatient Hospital Stay: Attending: Hematology and Oncology

## 2023-12-06 VITALS — BP 135/79 | HR 58 | Temp 98.0°F | Resp 15 | Wt 161.8 lb

## 2023-12-06 DIAGNOSIS — Z5111 Encounter for antineoplastic chemotherapy: Secondary | ICD-10-CM | POA: Diagnosis not present

## 2023-12-06 DIAGNOSIS — C7951 Secondary malignant neoplasm of bone: Secondary | ICD-10-CM | POA: Insufficient documentation

## 2023-12-06 DIAGNOSIS — Z7984 Long term (current) use of oral hypoglycemic drugs: Secondary | ICD-10-CM | POA: Insufficient documentation

## 2023-12-06 DIAGNOSIS — E1141 Type 2 diabetes mellitus with diabetic mononeuropathy: Secondary | ICD-10-CM | POA: Diagnosis not present

## 2023-12-06 DIAGNOSIS — C61 Malignant neoplasm of prostate: Secondary | ICD-10-CM

## 2023-12-06 DIAGNOSIS — Z7952 Long term (current) use of systemic steroids: Secondary | ICD-10-CM | POA: Insufficient documentation

## 2023-12-06 DIAGNOSIS — G629 Polyneuropathy, unspecified: Secondary | ICD-10-CM | POA: Insufficient documentation

## 2023-12-06 DIAGNOSIS — Z79899 Other long term (current) drug therapy: Secondary | ICD-10-CM | POA: Insufficient documentation

## 2023-12-06 LAB — CMP (CANCER CENTER ONLY)
ALT: 18 U/L (ref 0–44)
AST: 21 U/L (ref 15–41)
Albumin: 4.1 g/dL (ref 3.5–5.0)
Alkaline Phosphatase: 66 U/L (ref 38–126)
Anion gap: 7 (ref 5–15)
BUN: 19 mg/dL (ref 8–23)
CO2: 27 mmol/L (ref 22–32)
Calcium: 9.6 mg/dL (ref 8.9–10.3)
Chloride: 107 mmol/L (ref 98–111)
Creatinine: 0.91 mg/dL (ref 0.61–1.24)
GFR, Estimated: >60 mL/min (ref >60)
Glucose, Bld: 129 mg/dL — ABNORMAL HIGH (ref 70–99)
Potassium: 4 mmol/L (ref 3.5–5.1)
Sodium: 141 mmol/L (ref 135–145)
Total Bilirubin: 0.5 mg/dL (ref 0.0–1.2)
Total Protein: 7.1 g/dL (ref 6.5–8.1)

## 2023-12-06 LAB — CBC WITH DIFFERENTIAL (CANCER CENTER ONLY)
Abs Immature Granulocytes: 0.02 K/uL (ref 0.00–0.07)
Basophils Absolute: 0 K/uL (ref 0.0–0.1)
Basophils Relative: 0 %
Eosinophils Absolute: 0.4 K/uL (ref 0.0–0.5)
Eosinophils Relative: 5 %
HCT: 41.3 % (ref 39.0–52.0)
Hemoglobin: 14.3 g/dL (ref 13.0–17.0)
Immature Granulocytes: 0 %
Lymphocytes Relative: 25 %
Lymphs Abs: 2.3 K/uL (ref 0.7–4.0)
MCH: 33.7 pg (ref 26.0–34.0)
MCHC: 34.6 g/dL (ref 30.0–36.0)
MCV: 97.4 fL (ref 80.0–100.0)
Monocytes Absolute: 0.7 K/uL (ref 0.1–1.0)
Monocytes Relative: 7 %
Neutro Abs: 5.9 K/uL (ref 1.7–7.7)
Neutrophils Relative %: 63 %
Platelet Count: 304 K/uL (ref 150–400)
RBC: 4.24 MIL/uL (ref 4.22–5.81)
RDW: 13.1 % (ref 11.5–15.5)
WBC Count: 9.4 K/uL (ref 4.0–10.5)
nRBC: 0 % (ref 0.0–0.2)

## 2023-12-06 MED ORDER — LEUPROLIDE ACETATE (3 MONTH) 22.5 MG ~~LOC~~ KIT
22.5000 mg | PACK | Freq: Once | SUBCUTANEOUS | Status: AC
  Administered 2023-12-06: 22.5 mg via SUBCUTANEOUS
  Filled 2023-12-06: qty 22.5

## 2023-12-06 NOTE — Progress Notes (Signed)
 [facility] [phone]   [phone]  PROGRESS NOTE  Patient Care Team: [name] [name] LABOR, MD as PCP - General (Family Medicine) Pietro Redell RAMAN, MD as PCP - Cardiology (Cardiology) Skeet Juliene SAUNDERS, DO as Consulting Physician (Neurology) Vertell Pont, RN as Oncology Nurse Navigator  Hematological/Oncological History # Metastatic Prostate Cancer  06/21/2022: PSA 87.3 07/20/2022: RP lymphadenopathy, nodular prostate and 2 pulmonary nodules.  09/26/2022: TRUS biopsy showed adenocarcinoma of the prostate, Gleason Score 5+4 =9  10/27/2022: NM PSMA scan showed metastatic spread to lymph nodes/bones  10/2022: started Eligard  45 mg subcutaneous and Abiraterone  1000 mg PO daily with prednisone  5 mg PO daily.  11/16/2022: establish care with Dr. Federico  05/16/2023: Transitioned to Zytiga  250 mg p.o. daily with prednisone  5 mg p.o. daily.  Interval History:  [name] [name] 78 y.o. male with medical history significant for metastatic castrate sensitive prostate cancer who presents for a follow up visit. The patient's last visit was on 09/13/2023. In the interim since the last visit he is continued on Zytiga  therapy as prescribed.  On exam today, [name] [name] is accompanied by his wife.  He reports he has been well overall in the room since her last visit.  He reports he is taking his 1 pill of Zytiga  per day and tolerating well without difficulty.  He is taking it with the steroid.  He notes that he does have some occasional hot flashes or sweats but they are not particular troublesome to him.  He reports he is not currently having any pain anywhere he is not having any bone or back pain.  He does have some occasional muscle discomfort for which he is using Voltaren gel.  Overall he is willing and able to continue on Zytiga  and Lupron  therapy at this time.  Full 10 point ROS is otherwise negative.  MEDICAL HISTORY:  Past Medical History:  Diagnosis Date   CAD (coronary artery  disease)    Constipation    Diabetes mellitus without complication (HCC)    borderline/ pre diabetic   NSTEMI (non-ST elevated myocardial infarction) (HCC)    at [place]   Prostate cancer (HCC)    Stroke (HCC)    L side - 40 years ago    SURGICAL HISTORY: Past Surgical History:  Procedure Laterality Date   CARDIAC CATHETERIZATION     CATARACT EXTRACTION Bilateral    05/2021; 07/2021   CORONARY ARTERY BYPASS GRAFT     at [facility]    SOCIAL HISTORY: Social History   Socioeconomic History   Marital status: Married    Spouse name: Not on file   Number of children: 2   Years of education: Not on file   Highest education level: Master's degree (e.g., MA, MS, MEng, MEd, MSW, MBA)  Occupational History   Occupation: retired  Tobacco Use   Smoking status: Former    Current packs/day: 0.00    Types: Cigarettes    Quit date: 04/11/1968    Years since quitting: 55.6   Smokeless tobacco: Never   Tobacco comments:    quit in the 1970s  Vaping Use   Vaping status: Never Used  Substance and Sexual Activity   Alcohol use: Not Currently    Comment: Rare   Drug use: No   Sexual activity: Not on file  Other Topics Concern   Not on file  Social History Narrative   Right handed   One story home   Drinks no caffeine   Social Drivers of  Health   Financial Resource Strain: Low Risk  (10/11/2022)   Overall Financial Resource Strain (CARDIA)    Difficulty of Paying Living Expenses: Not hard at all  Food Insecurity: Patient Declined (05/22/2023)   Hunger Vital Sign    Worried About Running Out of Food in the Last Year: Patient declined    Ran Out of Food in the Last Year: Patient declined  Transportation Needs: Patient Declined (05/22/2023)   PRAPARE - Administrator, Civil Service (Medical): Patient declined    Lack of Transportation (Non-Medical): Patient declined  Physical Activity: Unknown (10/11/2022)   Exercise Vital Sign    Days of Exercise per Week: 5 days     Minutes of Exercise per Session: Patient declined  Stress: No Stress Concern Present (10/11/2022)   [employer] of Occupational Health - Occupational Stress Questionnaire    Feeling of Stress : Only a little  Social Connections: Patient Declined (05/22/2023)   Social Connection and Isolation Panel    Frequency of Communication with Friends and Family: Patient declined    Frequency of Social Gatherings with Friends and Family: Patient declined    Attends Religious Services: Patient declined    Database administrator or Organizations: Patient declined    Attends Banker Meetings: Patient declined    Marital Status: Patient declined  Intimate Partner Violence: Patient Declined (05/22/2023)   Humiliation, Afraid, Rape, and Kick questionnaire    Fear of Current or Ex-Partner: Patient declined    Emotionally Abused: Patient declined    Physically Abused: Patient declined    Sexually Abused: Patient declined    FAMILY HISTORY: Family History  Problem Relation Age of Onset   Dementia Mother        lived to 101 years   Liver disease Father    Diabetes Sister    Cirrhosis Brother    Cancer - Colon Neg Hx    Colon polyps Neg Hx    Rectal cancer Neg Hx    Esophageal cancer Neg Hx    Stomach cancer Neg Hx    Pancreatic cancer Neg Hx     ALLERGIES:  has no known allergies.  MEDICATIONS:  Current Outpatient Medications  Medication Sig Dispense Refill   abiraterone  acetate (ZYTIGA ) 250 MG tablet Take 4 tablets (1,000 mg total) by mouth daily. Take on an empty stomach 1 hour before or 2 hours after a meal 4 tablet 0   acetaminophen  (TYLENOL ) 500 MG tablet Take 1 tablet (500 mg total) by mouth every 6 (six) hours as needed. 30 tablet 0   apixaban  (ELIQUIS ) 5 MG TABS tablet Take 1 tablet (5 mg total) by mouth 2 (two) times daily. 180 tablet 3   atorvastatin  (LIPITOR) 80 MG tablet Take 1 tablet (80 mg total) by mouth daily. 90 tablet 3   calcium  carbonate (TUMS EX) 750 MG  chewable tablet Chew 0.5 tablets by mouth daily. (Patient taking differently: Chew 0.5 tablets by mouth as needed.)     clopidogrel  (PLAVIX ) 75 MG tablet Take 1 tablet (75 mg total) by mouth daily. 90 tablet 3   Continuous Glucose Receiver (FREESTYLE LIBRE 3 READER) DEVI 1 Application by Does not apply route every 14 (fourteen) days. 1 each 11   Continuous Glucose Sensor (FREESTYLE LIBRE 3 PLUS SENSOR) MISC Apply every 14 days 6 each 3   empagliflozin  (JARDIANCE ) 25 MG TABS tablet Take 1 tablet (25 mg total) by mouth daily before breakfast. 90 tablet 3   metFORMIN  (GLUCOPHAGE ) 1000  MG tablet Take 1 tablet (1,000 mg total) by mouth 2 (two) times daily with a meal. 60 tablet 5   metoprolol  succinate (TOPROL  XL) 25 MG 24 hr tablet Take 1 tablet (25 mg total) by mouth at bedtime. 90 tablet 3   pantoprazole  (PROTONIX ) 40 MG tablet Take 1 tablet (40 mg total) by mouth daily. 90 tablet 0   polyethylene glycol powder (GLYCOLAX /MIRALAX ) 17 GM/SCOOP powder Take 17 g by mouth in the morning and at bedtime. 17 g 0   predniSONE  (DELTASONE ) 5 MG tablet Take 1 tablet (5 mg total) by mouth daily with breakfast. 1 tablet 0   No current facility-administered medications for this visit.    REVIEW OF SYSTEMS:   Constitutional: ( - ) fevers, ( - )  chills , ( - ) night sweats Eyes: ( - ) blurriness of vision, ( - ) double vision, ( - ) watery eyes Ears, nose, mouth, throat, and face: ( - ) mucositis, ( - ) sore throat Respiratory: ( - ) cough, ( - ) dyspnea, ( - ) wheezes Cardiovascular: ( - ) palpitation, ( - ) chest discomfort, ( - ) lower extremity swelling Gastrointestinal:  ( - ) nausea, ( - ) heartburn, ( - ) change in bowel habits Skin: ( - ) abnormal skin rashes Lymphatics: ( - ) new lymphadenopathy, ( - ) easy bruising Neurological: ( - ) numbness, ( - ) tingling, ( - ) new weaknesses Behavioral/Psych: ( - ) mood change, ( - ) new changes  All other systems were reviewed with the patient and are  negative.  PHYSICAL EXAMINATION:  Vitals:   12/06/23 1204  BP: 135/79  Pulse: (!) 58  Resp: 15  Temp: 98 F (36.7 C)  SpO2: 98%    Filed Weights   12/06/23 1204  Weight: 161 lb 12.8 oz (73.4 kg)     GENERAL: Well-appearing elderly African-American male, alert, no distress and comfortable SKIN: skin color, texture, turgor are normal, no rashes or significant lesions EYES: conjunctiva are pink and non-injected, sclera clear LUNGS: clear to auscultation and percussion with normal breathing effort HEART: regular rate & rhythm and no murmurs and no lower extremity edema Musculoskeletal: no cyanosis of digits and no clubbing  PSYCH: alert & oriented x 3, fluent speech NEURO: no focal motor/sensory deficits  LABORATORY DATA:  I have reviewed the data as listed    Latest Ref Rng & Units 12/06/2023   11:24 AM 09/13/2023   12:15 PM 06/21/2023   12:40 PM  CBC  WBC 4.0 - 10.5 K/uL 9.4  8.7  9.5   Hemoglobin 13.0 - 17.0 g/dL 85.6  84.7  84.0   Hematocrit 39.0 - 52.0 % 41.3  44.0  47.4   Platelets 150 - 400 K/uL 304  285  292        Latest Ref Rng & Units 12/06/2023   11:24 AM 09/13/2023   12:15 PM 08/15/2023    1:34 PM  CMP  Glucose 70 - 99 mg/dL 870  830    BUN 8 - 23 mg/dL 19  21    Creatinine 9.38 - 1.24 mg/dL 9.08  9.08    Sodium 864 - 145 mmol/L 141  141    Potassium 3.5 - 5.1 mmol/L 4.0  3.9    Chloride 98 - 111 mmol/L 107  105    CO2 22 - 32 mmol/L 27  29    Calcium  8.9 - 10.3 mg/dL 9.6  9.6  Total Protein 6.5 - 8.1 g/dL 7.1  7.3  7.6   Total Bilirubin 0.0 - 1.2 mg/dL 0.5  0.5  0.6   Alkaline Phos 38 - 126 U/L 66  70  66   AST 15 - 41 U/L 21  17  21    ALT 0 - 44 U/L 18  12  14     RADIOGRAPHIC STUDIES: No results found.  ASSESSMENT & PLAN Yonis Oleson 78 y.o. male with medical history significant for metastatic castrate sensitive prostate cancer who presents for a follow up visit.   After review of the labs, review of the records, and discussion with the patient  the patients findings are most consistent with metastatic adenocarcinoma of the prostate, castrate sensitive.   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 -- Patient started Lupron  and Zytiga  1000 mg p.o. daily with 5 mg prednisone  in late July 2024. -- Diagnosis confirmed with prostate biopsy Gleason 9 with PSMA scan showing widespread metastasis to lymph nodes and bones. PLAN:  --Labs today reviewed. WBC 9.4, hemoglobin 14.3, MCV 97.4, platelets 304, creatinine and LFTs normal. --Last PSA level from 12/06/2023 was <0.1.  -- Continue Zytiga  250 mg p.o. daily with prednisone  5 mg.   -- RTC in 3 months with labs, follow up visit, Eligard  injection.    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   No orders of the defined types were placed in this encounter.   All questions were answered. The patient knows to call the clinic with any problems, questions or concerns.  A total of more than 30 minutes were spent on this encounter with face-to-face time and non-face-to-face time, including preparing to see the patient, ordering tests and/or medications, counseling the patient and coordination of care as outlined above.   Norleen IVAR Kidney, MD Department of Hematology/Oncology [facility] Phone: [phone] Pager: [phone] Email: [email or web address]  12/09/2023 6:26 PM

## 2023-12-07 LAB — TESTOSTERONE: Testosterone: <3 ng/dL — ABNORMAL LOW (ref 264–916)

## 2023-12-07 LAB — PROSTATE-SPECIFIC AG, SERUM (LABCORP): Prostate Specific Ag, Serum: <0.1 ng/mL (ref 0.0–4.0)

## 2023-12-08 ENCOUNTER — Ambulatory Visit: Payer: Self-pay | Admitting: *Deleted

## 2023-12-08 NOTE — Telephone Encounter (Signed)
-----   Message from Norleen ONEIDA Kidney IV sent at 12/07/2023  9:03 AM EDT ----- Please let Mr. Urbas know that his labs look great.  His PSA remains undetectable as does his testosterone .  His treatments are working well.  We will continue on his current regimen and plan to see  him back as scheduled in November 2025. ----- Message ----- From: Rebecka, Lab In [place] Sent: 12/06/2023  11:49 AM EDT To: Norleen ONEIDA Kidney [place], MD

## 2023-12-08 NOTE — Telephone Encounter (Signed)
 TCT patient's wife regarding recent lab results. Spoke with her. Advised that his labs look great.  His PSA remains undetectable as does his testosterone .  His treatments are working well.  We will continue on his current regimen and plan to see  him back as scheduled in November 2025. [name] [name] voiced understanding and is aware of hi future appts.

## 2023-12-09 ENCOUNTER — Encounter: Payer: Self-pay | Admitting: Hematology and Oncology

## 2023-12-12 ENCOUNTER — Other Ambulatory Visit: Payer: Self-pay

## 2023-12-12 ENCOUNTER — Ambulatory Visit: Admitting: Internal Medicine

## 2023-12-12 ENCOUNTER — Emergency Department (HOSPITAL_COMMUNITY)
Admission: EM | Admit: 2023-12-12 | Discharge: 2023-12-12 | Disposition: A | Discharge status: Home / Self Care | Attending: Emergency Medicine | Admitting: Emergency Medicine

## 2023-12-12 ENCOUNTER — Encounter (HOSPITAL_COMMUNITY): Payer: Self-pay

## 2023-12-12 ENCOUNTER — Ambulatory Visit: Payer: Self-pay

## 2023-12-12 ENCOUNTER — Emergency Department (HOSPITAL_COMMUNITY)

## 2023-12-12 DIAGNOSIS — R448 Other symptoms and signs involving general sensations and perceptions: Secondary | ICD-10-CM

## 2023-12-12 DIAGNOSIS — R0789 Other chest pain: Secondary | ICD-10-CM | POA: Diagnosis not present

## 2023-12-12 DIAGNOSIS — R220 Localized swelling, mass and lump, head: Secondary | ICD-10-CM | POA: Diagnosis not present

## 2023-12-12 DIAGNOSIS — Z7901 Long term (current) use of anticoagulants: Secondary | ICD-10-CM | POA: Insufficient documentation

## 2023-12-12 DIAGNOSIS — R2232 Localized swelling, mass and lump, left upper limb: Secondary | ICD-10-CM | POA: Insufficient documentation

## 2023-12-12 DIAGNOSIS — Z0189 Encounter for other specified special examinations: Secondary | ICD-10-CM | POA: Diagnosis not present

## 2023-12-12 LAB — CBC WITH DIFFERENTIAL/PLATELET
Abs Immature Granulocytes: 0.03 K/uL (ref 0.00–0.07)
Basophils Absolute: 0.1 K/uL (ref 0.0–0.1)
Basophils Relative: 0 %
Eosinophils Absolute: 0.3 K/uL (ref 0.0–0.5)
Eosinophils Relative: 3 %
HCT: 45.6 % (ref 39.0–52.0)
Hemoglobin: 15.1 g/dL (ref 13.0–17.0)
Immature Granulocytes: 0 %
Lymphocytes Relative: 29 %
Lymphs Abs: 3.3 K/uL (ref 0.7–4.0)
MCH: 33 pg (ref 26.0–34.0)
MCHC: 33.1 g/dL (ref 30.0–36.0)
MCV: 99.8 fL (ref 80.0–100.0)
Monocytes Absolute: 0.8 K/uL (ref 0.1–1.0)
Monocytes Relative: 7 %
Neutro Abs: 6.8 K/uL (ref 1.7–7.7)
Neutrophils Relative %: 61 %
Platelets: 260 K/uL (ref 150–400)
RBC: 4.57 MIL/uL (ref 4.22–5.81)
RDW: 13.2 % (ref 11.5–15.5)
Smear Review: NORMAL
WBC: 11.2 K/uL — ABNORMAL HIGH (ref 4.0–10.5)
nRBC: 0 % (ref 0.0–0.2)

## 2023-12-12 LAB — BASIC METABOLIC PANEL WITH GFR
Anion gap: 10 (ref 5–15)
BUN: 18 mg/dL (ref 8–23)
CO2: 25 mmol/L (ref 22–32)
Calcium: 9.6 mg/dL (ref 8.9–10.3)
Chloride: 104 mmol/L (ref 98–111)
Creatinine, Ser: 0.85 mg/dL (ref 0.61–1.24)
GFR, Estimated: >60 mL/min (ref >60)
Glucose, Bld: 128 mg/dL — ABNORMAL HIGH (ref 70–99)
Potassium: 4.7 mmol/L (ref 3.5–5.1)
Sodium: 139 mmol/L (ref 135–145)

## 2023-12-12 LAB — CBG MONITORING, ED: Glucose-Capillary: 100 mg/dL — ABNORMAL HIGH (ref 70–99)

## 2023-12-12 NOTE — ED Provider Triage Note (Signed)
 Emergency Medicine Provider Triage Evaluation Note  Gabriella Guile , a 78 y.o. male  was evaluated in triage.  Pt complains of increased sensation to the left axilla as well as to the lateral thorax.  He also states that he has noticed what he feels like his swelling however has not had any overt edema.  Previous medical history of CAD, NSTEMI, previous CABG, type 2 diabetes.  Review of Systems  Positive: As above Negative:   Physical Exam  BP 130/77 (BP Location: Right Arm)   Pulse 76   Temp (!) 97.5 F (36.4 C)   Resp 17   Ht 5' 5 (1.651 m)   Wt 74.8 kg   SpO2 99%   BMI 27.46 kg/m  Gen:   Awake, no distress   Resp:  Normal effort MSK:   Moves extremities without difficulty  Other:  Neurologic exam does not show any cranial nerve deficit nor does it appreciate any motor or sensory deficits.  He does endorse decreased sensation over the left axilla however during exam there is no elicited sensory deficits.  Medical Decision Making  Medically screening exam initiated at 2:35 PM.  Appropriate orders placed.  Antonius Deacon was informed that the remainder of the evaluation will be completed by another provider, this initial triage assessment does not replace that evaluation, and the importance of remaining in the ED until their evaluation is complete.  Will initiate basic lab evaluation, based on exam, there are no neurological deficits that would be significant for likely CVA.  Brain imaging on this time secondary to benign neurologic exam.   Myriam Dorn BROCKS, [place] 12/12/23 1438

## 2023-12-12 NOTE — ED Triage Notes (Signed)
 Denies SHOB and CP/burred vision/headaches.

## 2023-12-12 NOTE — ED Notes (Signed)
 Pt wife came out of room and stated patient needed something to eat or drink because his BGL was 50. Pt was aox4 and BGL was checked by this paramedic. This reading was  100. Pt provided with 4 oz grape juice per his request due to discrepancy in readings.

## 2023-12-12 NOTE — Discharge Instructions (Signed)
 You have been seen and discharged from the emergency department.  An ultrasound of your left upper extremity has been scheduled for tomorrow morning.  Please return tomorrow per the instructions in this packet to rule out a blood clot.  Otherwise your blood work and heart workup was normal.  Follow-up with your primary provider for further evaluation and further care. Take home medications as prescribed. If you have any worsening symptoms or further concerns for your health please return to an emergency department for further evaluation.

## 2023-12-12 NOTE — ED Provider Notes (Signed)
 [place] EMERGENCY DEPARTMENT AT [facility] Provider Note   CSN: [id number] Arrival date & time: 12/12/23  1358     Patient presents with: No chief complaint on file.   [name] [name] is a 78 y.o. male.   HPI   78 year old male presents emergency department with concern for fullness sensation extending from the left side of his face out to his left armpit and lateral chest.  He states that this started today.  He feels like he is swollen from the top of the left side of his head through his neck, shoulder and left side of the chest.  There does not appear to be any actual swelling, admitted by the spouse.  He denies any headache, sharp neck pain, sharp chest pain, difficulty breathing.  He has no focal weakness or numbness.  No history of DVT.  Prior to Admission medications   Medication Sig Start Date End Date Taking? Authorizing Provider  abiraterone  acetate (ZYTIGA ) 250 MG tablet Take 4 tablets (1,000 mg total) by mouth daily. Take on an empty stomach 1 hour before or 2 hours after a meal 10/19/22   [name] LABOR, MD  acetaminophen  (TYLENOL ) 500 MG tablet Take 1 tablet (500 mg total) by mouth every 6 (six) hours as needed. 06/03/22   Charlyn Sora, MD  apixaban  (ELIQUIS ) 5 MG TABS tablet Take 1 tablet (5 mg total) by mouth 2 (two) times daily. 06/28/23   [name] LABOR, MD  atorvastatin  (LIPITOR) 80 MG tablet Take 1 tablet (80 mg total) by mouth daily. 08/18/23   [name] LABOR, MD  calcium  carbonate (TUMS EX) 750 MG chewable tablet Chew 0.5 tablets by mouth daily. Patient taking differently: Chew 0.5 tablets by mouth as needed.    [provider]  clopidogrel  (PLAVIX ) 75 MG tablet Take 1 tablet (75 mg total) by mouth daily. 09/18/23   Pietro Redell RAMAN, MD  Continuous Glucose Receiver (FREESTYLE LIBRE 3 READER) DEVI 1 Application by Does not apply route every 14 (fourteen) days. 10/06/23   [name] LABOR, MD  Continuous Glucose Sensor (FREESTYLE LIBRE 3 PLUS SENSOR) MISC  Apply every 14 days 11/07/23   [name] LABOR, MD  empagliflozin  (JARDIANCE ) 25 MG TABS tablet Take 1 tablet (25 mg total) by mouth daily before breakfast. 11/22/23   [name] LABOR, MD  metFORMIN  (GLUCOPHAGE ) 1000 MG tablet Take 1 tablet (1,000 mg total) by mouth 2 (two) times daily with a meal. 06/01/23   [name] LABOR, MD  metoprolol  succinate (TOPROL  XL) 25 MG 24 hr tablet Take 1 tablet (25 mg total) by mouth at bedtime. 09/15/23   Pietro Redell RAMAN, MD  pantoprazole  (PROTONIX ) 40 MG tablet Take 1 tablet (40 mg total) by mouth daily. 05/23/23 11/21/23  Elgergawy, Brayton RAMAN, MD  polyethylene glycol powder (GLYCOLAX /MIRALAX ) 17 GM/SCOOP powder Take 17 g by mouth in the morning and at bedtime. 08/02/22   Beather Delon Gibson, PA  predniSONE  (DELTASONE ) 5 MG tablet Take 1 tablet (5 mg total) by mouth daily with breakfast. 10/19/22   [name] LABOR, MD    Allergies: Patient has no known allergies.    Review of Systems  Constitutional:  Negative for fever.  Respiratory:  Negative for shortness of breath.   Cardiovascular:  Negative for chest pain.  Gastrointestinal:  Negative for abdominal pain, diarrhea and vomiting.  Musculoskeletal:  Negative for back pain and neck pain.  Skin:  Negative for rash.  Neurological:  Negative for headaches.  Updated Vital Signs BP (!) 142/83   Pulse (!) 59   Temp (!) 97.5 F (36.4 C)   Resp 18   Ht 5' 5 (1.651 m)   Wt 74.8 kg   SpO2 100%   BMI 27.46 kg/m   Physical Exam Vitals and nursing note reviewed.  Constitutional:      Appearance: Normal appearance.  HENT:     Head: Normocephalic.     Comments: No noted swelling of the left side of the head or face    Mouth/Throat:     Mouth: Mucous membranes are moist.  Eyes:     Pupils: Pupils are equal, round, and reactive to light.  Neck:     Comments: No edema noted Cardiovascular:     Rate and Rhythm: Normal rate.  Pulmonary:     Effort: Pulmonary effort is normal. No respiratory distress.   Abdominal:     Palpations: Abdomen is soft.     Tenderness: There is no abdominal tenderness.  Musculoskeletal:     Cervical back: Neck supple. No rigidity or tenderness.     Comments: No objective swelling or edema of the left side of the face, head, neck, shoulder, arm, axillary or chest wall  Skin:    General: Skin is warm.  Neurological:     Mental Status: He is alert and oriented to person, place, and time. Mental status is at baseline.  Psychiatric:        Mood and Affect: Mood normal.     (all labs ordered are listed, but only abnormal results are displayed) Labs Reviewed  CBC WITH DIFFERENTIAL/PLATELET - Abnormal; Notable for the following components:      Result Value   WBC 11.2 (*)    All other components within normal limits  BASIC METABOLIC PANEL WITH GFR - Abnormal; Notable for the following components:   Glucose, Bld 128 (*)    All other components within normal limits  CBG MONITORING, ED - Abnormal; Notable for the following components:   Glucose-Capillary 100 (*)    All other components within normal limits    EKG: None  Radiology: [facility] Chest Port 1 View Result Date: 12/12/2023 CLINICAL DATA:  Left arm heaviness. EXAM: PORTABLE CHEST 1 VIEW COMPARISON:  Chest radiograph dated 06/21/2022. FINDINGS: No focal consolidation, pleural effusion, pneumothorax. The cardiac silhouette is within limits. 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 No acute osseous pathology. IMPRESSION: No active disease. Electronically Signed   By: Vanetta Chou M.D.   On: 12/12/2023 18:49     Procedures   Medications Ordered in the ED - No data to display                                  Medical Decision Making Amount and/or Complexity of Data Reviewed Labs: ordered. Radiology: ordered.   78 year old male presents emergency department with a fullness sensation on the left side of his body.  Vitals are normal and stable.  Patient states that the symptoms started today.   He feels like the left side of his body is swollen.  He does not describe it as a painful sensation.  He denies any sort of numbness.  There is no focal weakness.  His neuroexam is very reassuring.  I feel this would be an atypical presentation for CVA.  With the fullness sensation involving the left neck/shoulder and chest wall an EKG was done which is unchanged for  the patient.  Chest x-ray shows no focal abnormality.  Blood work is reassuring.  He has no objective swelling on physical exam, there is maybe a small area just inferior to the left axillary that is puffy but no focal swollen lymph nodes, skin changes or other acute findings.  The left upper extremity is unremarkable.  Discussed with the patient and family getting an outpatient ultrasound to rule out DVT but otherwise I do not see any acute findings on physical exam to warrant further emergent evaluation.  Patient states that the symptoms are improved and almost resolved and offers no new concerns or complaints.  Patient at this time appears safe and stable for discharge and close outpatient follow up. Discharge plan and strict return to ED precautions discussed, patient verbalizes understanding and agreement.     Final diagnoses:  None    ED Discharge Orders     None          Bari Roxie HERO, DO 12/12/23 2253

## 2023-12-12 NOTE — ED Triage Notes (Signed)
 Pt complains of L. Arm numbness and swelling in L. Armpit area since last Thursday after weedeating.  No pain at this time.

## 2023-12-12 NOTE — Telephone Encounter (Signed)
 FYI Only or Action Required?: FYI only for provider.  Patient was last seen in primary care on 11/21/2023 by [name] LABOR, MD.  Called Nurse Triage reporting Arm Problem and Numbness. - swelling  Symptoms began a week ago.  Interventions attempted: Nothing.  Symptoms are: unchanged.  Triage Disposition: See HCP Within 4 Hours (Or PCP Triage)  Patient/caregiver understands and will follow disposition?: Yes              Copied from CRM [id number]. Topic: Clinical - Red Word Triage >> Dec 12, 2023 12:52 PM Chiquita SQUIBB wrote: Red Word that prompted transfer to Nurse Triage: Patients wife is calling in stating that the patient's left arm is numb and it started today. Patients wife states that is the only numb area. Reason for Disposition  [1] Numbness (i.e., loss of sensation) of the face, arm / hand, or leg / foot on one side of the body AND [2] gradual onset (e.g., days to weeks) AND [3] present now  Answer Assessment - Initial Assessment Questions 1. SYMPTOM: What is the main symptom you are concerned about? (e.g., weakness, numbness)     Numbness -  2. ONSET: When did this start? (e.g., minutes, hours, days; while sleeping)     unsure 3. LAST NORMAL: When was the last time you (the patient) were normal (no symptoms)?     Unsure - maybe 1 week 4. PATTERN Does this come and go, or has it been constant since it started?  Is it present now?     constant 5. CARDIAC SYMPTOMS: Have you had any of the following symptoms: chest pain, difficulty breathing, palpitations?     Not at the moment 6. NEUROLOGIC SYMPTOMS: Have you had any of the following symptoms: headache, dizziness, vision loss, double vision, changes in speech, unsteady on your feet?     no 7. OTHER SYMPTOMS: Do you have any other symptoms?     Arm might be swollen  Protocols used: Neurologic Deficit-A-AH

## 2023-12-13 ENCOUNTER — Ambulatory Visit (HOSPITAL_COMMUNITY)
Admission: RE | Admit: 2023-12-13 | Discharge: 2023-12-13 | Disposition: A | Discharge status: Home / Self Care | Source: Ambulatory Visit | Attending: Emergency Medicine | Admitting: Emergency Medicine

## 2023-12-13 DIAGNOSIS — R600 Localized edema: Secondary | ICD-10-CM | POA: Diagnosis not present

## 2023-12-13 DIAGNOSIS — R609 Edema, unspecified: Secondary | ICD-10-CM | POA: Diagnosis not present

## 2023-12-18 ENCOUNTER — Ambulatory Visit: Admitting: Family Medicine

## 2023-12-18 ENCOUNTER — Encounter: Payer: Self-pay | Admitting: Family Medicine

## 2023-12-18 VITALS — BP 110/60 | HR 66 | Temp 98.2°F | Wt 165.6 lb

## 2023-12-18 DIAGNOSIS — R220 Localized swelling, mass and lump, head: Secondary | ICD-10-CM

## 2023-12-18 DIAGNOSIS — M7989 Other specified soft tissue disorders: Secondary | ICD-10-CM | POA: Diagnosis not present

## 2023-12-18 NOTE — Progress Notes (Signed)
   Subjective:    Patient ID: [name] [name], male    DOB: [birth year], 78 y.o.   MRN: [id number]  HPI Here with his wife to follow up on an ED visit on 12-12-23 for feelings of swelling or pressure in the left side of his face and the left side of his chest. No SOB. At the ED his exam and EKG and labs and CXR were al unremarkable. He was sent home, and then he had a left arm venous doppler on 12-13-23. This was negative for any DVT in the arm or the subclavian vein. Since then he has improved greatly. He still feels a tiny bit of pressure on the left side, but his wife says all the visible swelling is gone    Review of Systems  Constitutional: Negative.   Respiratory: Negative.    Cardiovascular: Negative.   Gastrointestinal: Negative.   Genitourinary: Negative.        Objective:   Physical Exam Constitutional:      Appearance: He is not ill-appearing.  Cardiovascular:     Rate and Rhythm: Normal rate and regular rhythm.     Pulses: Normal pulses.     Heart sounds: Normal heart sounds.  Pulmonary:     Effort: Pulmonary effort is normal.     Breath sounds: Normal breath sounds.  Neurological:     Mental Status: He is alert.           Assessment & Plan:  Transient swelling in the face and left trunk on the left side. No etiology is found. At any rate he seems to be getting over it. He will follow up with us  in November to check another A1c, and he will see Oncology then as well. We spent a total of ( 34  ) minutes reviewing records and discussing these issues.  Garnette Olmsted, MD

## 2023-12-20 ENCOUNTER — Encounter: Payer: Self-pay | Admitting: *Deleted

## 2024-01-08 ENCOUNTER — Encounter: Payer: Self-pay | Admitting: *Deleted

## 2024-02-05 ENCOUNTER — Encounter: Payer: Self-pay | Admitting: Podiatry

## 2024-02-05 ENCOUNTER — Ambulatory Visit (INDEPENDENT_AMBULATORY_CARE_PROVIDER_SITE_OTHER): Admitting: Podiatry

## 2024-02-05 DIAGNOSIS — B351 Tinea unguium: Secondary | ICD-10-CM | POA: Diagnosis not present

## 2024-02-05 DIAGNOSIS — I739 Peripheral vascular disease, unspecified: Secondary | ICD-10-CM | POA: Diagnosis not present

## 2024-02-05 DIAGNOSIS — E1140 Type 2 diabetes mellitus with diabetic neuropathy, unspecified: Secondary | ICD-10-CM | POA: Diagnosis not present

## 2024-02-05 DIAGNOSIS — M79676 Pain in unspecified toe(s): Secondary | ICD-10-CM | POA: Diagnosis not present

## 2024-02-05 NOTE — Progress Notes (Addendum)
 This patient returns to my office for at risk foot care.  This patient requires this care by a professional since this patient will be at risk due to having PVD and DM.  This patient is unable to cut nails himself since the patient cannot reach his nails.These nails are painful walking and wearing shoes.  This patient has not been seen in over ten months.  He presents to the office with his wife.  This patient presents for at risk foot care today.  General Appearance  Alert, conversant and in no acute stress.  Vascular  Dorsalis pedis and posterior tibial  pulses are palpable  bilaterally.  Capillary return is within normal limits  bilaterally. Temperature is within normal limits  bilaterally.  Neurologic  Senn-Weinstein monofilament wire test within normal limits  bilaterally. Muscle power within normal limits bilaterally.  Nails Thick disfigured discolored nails with subungual debris  from hallux to fifth toes bilaterally. No evidence of bacterial infection or drainage bilaterally.  Orthopedic  No limitations of motion  feet .  No crepitus or effusions noted.  No bony pathology or digital deformities noted.  Skin  normotropic skin with no porokeratosis noted bilaterally.  No signs of infections or ulcers noted.     Onychomycosis  Pain in right toes  Pain in left toes  Consent was obtained for treatment procedures.   Mechanical debridement of nails 1-5  bilaterally performed with a nail nipper.  Filed with dremel without incident.    Return office visit      3 months                Told patient to return for periodic foot care and evaluation due to potential at risk complications.   Cordella Bold DPM  tomma

## 2024-02-22 ENCOUNTER — Ambulatory Visit: Admitting: Podiatry

## 2024-02-22 DIAGNOSIS — M79676 Pain in unspecified toe(s): Secondary | ICD-10-CM

## 2024-02-22 DIAGNOSIS — B351 Tinea unguium: Secondary | ICD-10-CM

## 2024-02-23 NOTE — Progress Notes (Signed)
 He presents today concerned about a area of blood on the lateral nail margin of his left hallux.  He states that he is doing pretty well otherwise.  Objective: Vital signs are stable alert oriented x 3.  Hallux left demonstrates where a small piece of nail had been resected it caused some bleeding and it has scabbed over.  I see no signs of infection and is nontender on palpation.  Assessment: Resolving paronychia.  Plan: Follow-up with us  as needed.

## 2024-02-27 ENCOUNTER — Other Ambulatory Visit: Payer: Self-pay

## 2024-02-27 ENCOUNTER — Other Ambulatory Visit: Payer: Self-pay | Admitting: Hematology and Oncology

## 2024-02-27 DIAGNOSIS — C61 Malignant neoplasm of prostate: Secondary | ICD-10-CM

## 2024-02-28 ENCOUNTER — Inpatient Hospital Stay: Attending: Hematology and Oncology

## 2024-02-28 ENCOUNTER — Inpatient Hospital Stay: Admitting: Hematology and Oncology

## 2024-02-28 ENCOUNTER — Inpatient Hospital Stay

## 2024-02-28 VITALS — BP 136/63 | HR 57 | Temp 97.2°F | Resp 13 | Wt 162.6 lb

## 2024-02-28 DIAGNOSIS — Z79899 Other long term (current) drug therapy: Secondary | ICD-10-CM | POA: Insufficient documentation

## 2024-02-28 DIAGNOSIS — Z5111 Encounter for antineoplastic chemotherapy: Secondary | ICD-10-CM | POA: Insufficient documentation

## 2024-02-28 DIAGNOSIS — C61 Malignant neoplasm of prostate: Secondary | ICD-10-CM | POA: Diagnosis not present

## 2024-02-28 DIAGNOSIS — C7951 Secondary malignant neoplasm of bone: Secondary | ICD-10-CM | POA: Diagnosis not present

## 2024-02-28 DIAGNOSIS — Z7952 Long term (current) use of systemic steroids: Secondary | ICD-10-CM | POA: Insufficient documentation

## 2024-02-28 LAB — CBC WITH DIFFERENTIAL (CANCER CENTER ONLY)
Abs Immature Granulocytes: 0.02 K/uL (ref 0.00–0.07)
Basophils Absolute: 0 K/uL (ref 0.0–0.1)
Basophils Relative: 0 %
Eosinophils Absolute: 0.7 K/uL — ABNORMAL HIGH (ref 0.0–0.5)
Eosinophils Relative: 9 %
HCT: 40 % (ref 39.0–52.0)
Hemoglobin: 13.6 g/dL (ref 13.0–17.0)
Immature Granulocytes: 0 %
Lymphocytes Relative: 26 %
Lymphs Abs: 2.1 K/uL (ref 0.7–4.0)
MCH: 33.2 pg (ref 26.0–34.0)
MCHC: 34 g/dL (ref 30.0–36.0)
MCV: 97.6 fL (ref 80.0–100.0)
Monocytes Absolute: 0.5 K/uL (ref 0.1–1.0)
Monocytes Relative: 6 %
Neutro Abs: 4.7 K/uL (ref 1.7–7.7)
Neutrophils Relative %: 59 %
Platelet Count: 269 K/uL (ref 150–400)
RBC: 4.1 MIL/uL — ABNORMAL LOW (ref 4.22–5.81)
RDW: 13 % (ref 11.5–15.5)
WBC Count: 7.9 K/uL (ref 4.0–10.5)
nRBC: 0 % (ref 0.0–0.2)

## 2024-02-28 LAB — CMP (CANCER CENTER ONLY)
ALT: 17 U/L (ref 0–44)
AST: 23 U/L (ref 15–41)
Albumin: 4.1 g/dL (ref 3.5–5.0)
Alkaline Phosphatase: 67 U/L (ref 38–126)
Anion gap: 10 (ref 5–15)
BUN: 18 mg/dL (ref 8–23)
CO2: 27 mmol/L (ref 22–32)
Calcium: 9.6 mg/dL (ref 8.9–10.3)
Chloride: 104 mmol/L (ref 98–111)
Creatinine: 0.87 mg/dL (ref 0.61–1.24)
GFR, Estimated: >60 mL/min (ref >60)
Glucose, Bld: 171 mg/dL — ABNORMAL HIGH (ref 70–99)
Potassium: 3.8 mmol/L (ref 3.5–5.1)
Sodium: 141 mmol/L (ref 135–145)
Total Bilirubin: 0.5 mg/dL (ref 0.0–1.2)
Total Protein: 6.8 g/dL (ref 6.5–8.1)

## 2024-02-28 LAB — PSA: Prostatic Specific Antigen: <0.02 ng/mL (ref 0.00–4.00)

## 2024-02-28 MED ORDER — LEUPROLIDE ACETATE (3 MONTH) 22.5 MG ~~LOC~~ KIT
22.5000 mg | PACK | Freq: Once | SUBCUTANEOUS | Status: AC
  Administered 2024-02-28: 22.5 mg via SUBCUTANEOUS
  Filled 2024-02-28: qty 22.5

## 2024-02-28 MED ORDER — PREDNISONE 5 MG PO TABS: 5.0000 mg | ORAL_TABLET | Freq: Every day | ORAL | 1 refills | Status: AC

## 2024-02-28 NOTE — Progress Notes (Signed)
 [facility] [phone]   [phone]  PROGRESS NOTE  Patient Care Team: [name] LABOR, MD as PCP - General (Family Medicine) Pietro Redell RAMAN, MD as PCP - Cardiology (Cardiology) Skeet Juliene SAUNDERS, DO as Consulting Physician (Neurology) Vertell Pont, RN as Oncology Nurse Navigator  Hematological/Oncological History # Metastatic Prostate Cancer  06/21/2022: PSA 87.3 07/20/2022: RP lymphadenopathy, nodular prostate and 2 pulmonary nodules.  09/26/2022: TRUS biopsy showed adenocarcinoma of the prostate, Gleason Score 5+4 =9  10/27/2022: NM PSMA scan showed metastatic spread to lymph nodes/bones  10/2022: started Eligard  45 mg subcutaneous and Abiraterone  1000 mg PO daily with prednisone  5 mg PO daily.  11/16/2022: establish care with Dr. Federico  05/16/2023: Transitioned to Zytiga  250 mg p.o. daily with prednisone  5 mg p.o. daily.  Interval History:  [name] [name] 78 y.o. male with medical history significant for metastatic castrate sensitive prostate cancer who presents for a follow up visit. The patient's last visit was on 09/13/2023. In the interim since the last visit he is continued on Zytiga  therapy as prescribed.  On exam today, [name] [name] is accompanied by his wife.  He reports has been well overall in interim since our last visit.  He reports that he is looking forward to Thanksgiving where he will be spending it with his cousins.  He reports he is been feeling good with good energy and appetite.  He continues taking his Zytiga  pills with minimal side effects.  He reports he does have some occasional hot flashes and sweats and did recently develop a bit of a dry cough.  He is not producing any mucus but occasionally some whitish phlegm.  He reports that he is not having any nausea, vomiting, or diarrhea.  He notes that he is passing his stools easily without difficulty and is not having any further constipation.  Overall he feels quite well and is willing and able to  continue on Eligard  and Zytiga  therapy at this time.  Full 10 point ROS otherwise negative.  MEDICAL HISTORY:  Past Medical History:  Diagnosis Date   CAD (coronary artery disease)    Constipation    Diabetes mellitus without complication (HCC)    borderline/ pre diabetic   NSTEMI (non-ST elevated myocardial infarction) (HCC)    at [place]   Prostate cancer (HCC)    Stroke (HCC)    L side - 40 years ago    SURGICAL HISTORY: Past Surgical History:  Procedure Laterality Date   CARDIAC CATHETERIZATION     CATARACT EXTRACTION Bilateral    05/2021; 07/2021   CORONARY ARTERY BYPASS GRAFT     at [facility]    SOCIAL HISTORY: Social History   Socioeconomic History   Marital status: Married    Spouse name: Not on file   Number of children: 2   Years of education: Not on file   Highest education level: Master's degree (e.g., MA, MS, MEng, MEd, MSW, MBA)  Occupational History   Occupation: retired  Tobacco Use   Smoking status: Former    Current packs/day: 0.00    Types: Cigarettes    Quit date: 04/11/1968    Years since quitting: 55.9   Smokeless tobacco: Never   Tobacco comments:    quit in the 1970s  Vaping Use   Vaping status: Never Used  Substance and Sexual Activity   Alcohol use: Not Currently    Comment: Rare   Drug use: No   Sexual activity: Not on file  Other Topics Concern  Not on file  Social History Narrative   Right handed   One story home   Drinks no caffeine   Social Drivers of Health   Financial Resource Strain: Low Risk  (10/11/2022)   Overall Financial Resource Strain (CARDIA)    Difficulty of Paying Living Expenses: Not hard at all  Food Insecurity: Patient Declined (05/22/2023)   Hunger Vital Sign    Worried About Running Out of Food in the Last Year: Patient declined    Ran Out of Food in the Last Year: Patient declined  Transportation Needs: Patient Declined (05/22/2023)   PRAPARE - Administrator, Civil Service (Medical): Patient  declined    Lack of Transportation (Non-Medical): Patient declined  Physical Activity: Unknown (10/11/2022)   Exercise Vital Sign    Days of Exercise per Week: 5 days    Minutes of Exercise per Session: Patient declined  Stress: No Stress Concern Present (10/11/2022)   [employer] of Occupational Health - Occupational Stress Questionnaire    Feeling of Stress : Only a little  Social Connections: Patient Declined (05/22/2023)   Social Connection and Isolation Panel    Frequency of Communication with Friends and Family: Patient declined    Frequency of Social Gatherings with Friends and Family: Patient declined    Attends Religious Services: Patient declined    Database Administrator or Organizations: Patient declined    Attends Banker Meetings: Patient declined    Marital Status: Patient declined  Intimate Partner Violence: Patient Declined (05/22/2023)   Humiliation, Afraid, Rape, and Kick questionnaire    Fear of Current or Ex-Partner: Patient declined    Emotionally Abused: Patient declined    Physically Abused: Patient declined    Sexually Abused: Patient declined    FAMILY HISTORY: Family History  Problem Relation Age of Onset   Dementia Mother        lived to 101 years   Liver disease Father    Diabetes Sister    Cirrhosis Brother    Cancer - Colon Neg Hx    Colon polyps Neg Hx    Rectal cancer Neg Hx    Esophageal cancer Neg Hx    Stomach cancer Neg Hx    Pancreatic cancer Neg Hx     ALLERGIES:  has no known allergies.  MEDICATIONS:  Current Outpatient Medications  Medication Sig Dispense Refill   abiraterone  acetate (ZYTIGA ) 250 MG tablet Take 4 tablets (1,000 mg total) by mouth daily. Take on an empty stomach 1 hour before or 2 hours after a meal 4 tablet 0   acetaminophen  (TYLENOL ) 500 MG tablet Take 1 tablet (500 mg total) by mouth every 6 (six) hours as needed. 30 tablet 0   apixaban  (ELIQUIS ) 5 MG TABS tablet Take 1 tablet (5 mg total) by  mouth 2 (two) times daily. 180 tablet 3   atorvastatin  (LIPITOR) 80 MG tablet Take 1 tablet (80 mg total) by mouth daily. 90 tablet 3   calcium  carbonate (TUMS EX) 750 MG chewable tablet Chew 0.5 tablets by mouth daily. (Patient taking differently: Chew 0.5 tablets by mouth as needed.)     clopidogrel  (PLAVIX ) 75 MG tablet Take 1 tablet (75 mg total) by mouth daily. 90 tablet 3   Continuous Glucose Receiver (FREESTYLE LIBRE 3 READER) DEVI 1 Application by Does not apply route every 14 (fourteen) days. 1 each 11   Continuous Glucose Sensor (FREESTYLE LIBRE 3 PLUS SENSOR) MISC Apply every 14 days 6 each 3  empagliflozin  (JARDIANCE ) 25 MG TABS tablet Take 1 tablet (25 mg total) by mouth daily before breakfast. 90 tablet 3   metFORMIN  (GLUCOPHAGE ) 1000 MG tablet Take 1 tablet (1,000 mg total) by mouth 2 (two) times daily with a meal. 60 tablet 5   metoprolol  succinate (TOPROL  XL) 25 MG 24 hr tablet Take 1 tablet (25 mg total) by mouth at bedtime. 90 tablet 3   pantoprazole  (PROTONIX ) 40 MG tablet Take 1 tablet (40 mg total) by mouth daily. 90 tablet 0   polyethylene glycol powder (GLYCOLAX /MIRALAX ) 17 GM/SCOOP powder Take 17 g by mouth in the morning and at bedtime. 17 g 0   No current facility-administered medications for this visit.    REVIEW OF SYSTEMS:   Constitutional: ( - ) fevers, ( - )  chills , ( - ) night sweats Eyes: ( - ) blurriness of vision, ( - ) double vision, ( - ) watery eyes Ears, nose, mouth, throat, and face: ( - ) mucositis, ( - ) sore throat Respiratory: ( - ) cough, ( - ) dyspnea, ( - ) wheezes Cardiovascular: ( - ) palpitation, ( - ) chest discomfort, ( - ) lower extremity swelling Gastrointestinal:  ( - ) nausea, ( - ) heartburn, ( - ) change in bowel habits Skin: ( - ) abnormal skin rashes Lymphatics: ( - ) new lymphadenopathy, ( - ) easy bruising Neurological: ( - ) numbness, ( - ) tingling, ( - ) new weaknesses Behavioral/Psych: ( - ) mood change, ( - ) new changes   All other systems were reviewed with the patient and are negative.  PHYSICAL EXAMINATION:  Vitals:   02/28/24 1148  BP: 136/63  Pulse: (!) 57  Resp: 13  Temp: (!) 97.2 F (36.2 C)  SpO2: 99%    Filed Weights   02/28/24 1148  Weight: 162 lb 9.6 oz (73.8 kg)     GENERAL: Well-appearing elderly African-American male, alert, no distress and comfortable SKIN: skin color, texture, turgor are normal, no rashes or significant lesions EYES: conjunctiva are pink and non-injected, sclera clear LUNGS: clear to auscultation and percussion with normal breathing effort HEART: regular rate & rhythm and no murmurs and no lower extremity edema Musculoskeletal: no cyanosis of digits and no clubbing  PSYCH: alert & oriented x 3, fluent speech NEURO: no focal motor/sensory deficits  LABORATORY DATA:  I have reviewed the data as listed    Latest Ref Rng & Units 02/28/2024   11:22 AM 12/12/2023    5:18 PM 12/06/2023   11:24 AM  CBC  WBC 4.0 - 10.5 K/uL 7.9  11.2  9.4   Hemoglobin 13.0 - 17.0 g/dL 86.3  84.8  85.6   Hematocrit 39.0 - 52.0 % 40.0  45.6  41.3   Platelets 150 - 400 K/uL 269  260  304        Latest Ref Rng & Units 12/12/2023    5:18 PM 12/06/2023   11:24 AM 09/13/2023   12:15 PM  CMP  Glucose 70 - 99 mg/dL 871  870  830   BUN 8 - 23 mg/dL 18  19  21    Creatinine 0.61 - 1.24 mg/dL 9.14  9.08  9.08   Sodium 135 - 145 mmol/L 139  141  141   Potassium 3.5 - 5.1 mmol/L 4.7  4.0  3.9   Chloride 98 - 111 mmol/L 104  107  105   CO2 22 - 32 mmol/L 25  27  29  Calcium  8.9 - 10.3 mg/dL 9.6  9.6  9.6   Total Protein 6.5 - 8.1 g/dL  7.1  7.3   Total Bilirubin 0.0 - 1.2 mg/dL  0.5  0.5   Alkaline Phos 38 - 126 U/L  66  70   AST 15 - 41 U/L  21  17   ALT 0 - 44 U/L  18  12    RADIOGRAPHIC STUDIES: No results found.  ASSESSMENT & PLAN [name] [name] 78 y.o. male with medical history significant for metastatic castrate sensitive prostate cancer who presents for a follow up visit.    After review of the labs, review of the records, and discussion with the patient the patients findings are most consistent with metastatic adenocarcinoma of the prostate, castrate sensitive.   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 -- Patient started Lupron  and Zytiga  1000 mg p.o. daily with 5 mg prednisone  in late July 2024. -- Diagnosis confirmed with prostate biopsy Gleason 9 with PSMA scan showing widespread metastasis to lymph nodes and bones. PLAN:  --Labs today reviewed. WBC 7.9, hemoglobin 13.6, MCV 97.6, platelets 269. Creatinine and LFTs normal. --Last PSA level from 12/06/2023 was <0.1.  -- Continue Zytiga  250 mg p.o. daily with prednisone  5 mg.   -- RTC in 3 months with labs, follow up visit, Eligard  injection.    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   No orders of the defined types were placed in this encounter.   All questions were answered. The patient knows to call the clinic with any problems, questions or concerns.  A total of more than 30 minutes were spent on this encounter with face-to-face time and non-face-to-face time, including preparing to see the patient, ordering tests and/or medications, counseling the patient and coordination of care as outlined above.   Norleen IVAR Kidney, MD Department of Hematology/Oncology [facility] Phone: [phone] Pager: [phone] Email: [email or web address]  02/28/2024 11:55 AM

## 2024-02-29 LAB — TESTOSTERONE: Testosterone: <3 ng/dL — ABNORMAL LOW (ref 264–916)

## 2024-03-01 ENCOUNTER — Ambulatory Visit: Payer: Self-pay

## 2024-03-01 NOTE — Telephone Encounter (Signed)
-----   Message from Nurse Almarie T sent at 03/01/2024  8:48 AM EST -----  ----- Message ----- From: Federico Norleen ONEIDA MADISON, MD Sent: 02/29/2024  10:31 AM EST To: Almarie DELENA Arabia, RN  Please let Mr. Mcgrail know that his PSA and testosterone  levels are both undetectable. His prostate cancer is under excellent control. We will see him back as scheduled in 3 months time ----- Message ----- From: Interface, Lab In [place] Sent: 02/28/2024  11:36 AM EST To: Norleen ONEIDA Federico MADISON, MD

## 2024-03-01 NOTE — Telephone Encounter (Signed)
 TC to [name] [name] regarding recent lab results. Left message for him to return the call.

## 2024-03-01 NOTE — Telephone Encounter (Signed)
 Returned call to pt.  Spoke with  [name] [name] and let him know that his PSA and testosterone  levels are both undetectable. Reviewed that his prostate cancer is under excellent control. Reminded that we  will see him back as scheduled in 3 months time ,May 22, 2024 .  Pt voiced understanding. ----- Message -----

## 2024-03-02 ENCOUNTER — Encounter: Payer: Self-pay | Admitting: Hematology and Oncology

## 2024-03-05 ENCOUNTER — Ambulatory Visit

## 2024-03-12 ENCOUNTER — Ambulatory Visit: Admitting: Podiatry

## 2024-03-14 ENCOUNTER — Other Ambulatory Visit: Payer: Self-pay | Admitting: Family Medicine

## 2024-05-22 ENCOUNTER — Inpatient Hospital Stay: Attending: Hematology and Oncology

## 2024-05-22 ENCOUNTER — Inpatient Hospital Stay

## 2024-05-22 ENCOUNTER — Inpatient Hospital Stay: Admitting: Physician Assistant

## 2024-07-15 ENCOUNTER — Ambulatory Visit: Admitting: Neurology

## 2024-08-14 ENCOUNTER — Inpatient Hospital Stay: Attending: Hematology and Oncology

## 2024-08-14 ENCOUNTER — Inpatient Hospital Stay

## 2024-08-14 ENCOUNTER — Inpatient Hospital Stay: Admitting: Physician Assistant
